# Patient Record
Sex: Female | Born: 1975 | Race: Black or African American | Hispanic: No | Marital: Married | Smoking: Current some day smoker
Health system: Southern US, Community
[De-identification: ages and names within clinical notes are randomized; demographics above are authoritative.]

## PROBLEM LIST (undated history)

## (undated) DIAGNOSIS — G5600 Carpal tunnel syndrome, unspecified upper limb: Secondary | ICD-10-CM

## (undated) DIAGNOSIS — N952 Postmenopausal atrophic vaginitis: Secondary | ICD-10-CM

## (undated) DIAGNOSIS — M25562 Pain in left knee: Secondary | ICD-10-CM

## (undated) DIAGNOSIS — K219 Gastro-esophageal reflux disease without esophagitis: Secondary | ICD-10-CM

## (undated) DIAGNOSIS — R748 Abnormal levels of other serum enzymes: Secondary | ICD-10-CM

## (undated) DIAGNOSIS — F329 Major depressive disorder, single episode, unspecified: Secondary | ICD-10-CM

## (undated) DIAGNOSIS — Z87448 Personal history of other diseases of urinary system: Secondary | ICD-10-CM

## (undated) DIAGNOSIS — E1169 Type 2 diabetes mellitus with other specified complication: Secondary | ICD-10-CM

## (undated) DIAGNOSIS — Z124 Encounter for screening for malignant neoplasm of cervix: Secondary | ICD-10-CM

## (undated) DIAGNOSIS — E785 Hyperlipidemia, unspecified: Secondary | ICD-10-CM

## (undated) DIAGNOSIS — J309 Allergic rhinitis, unspecified: Secondary | ICD-10-CM

## (undated) DIAGNOSIS — E28319 Asymptomatic premature menopause: Secondary | ICD-10-CM

## (undated) DIAGNOSIS — E669 Obesity, unspecified: Secondary | ICD-10-CM

## (undated) DIAGNOSIS — G4700 Insomnia, unspecified: Secondary | ICD-10-CM

## (undated) DIAGNOSIS — I10 Essential (primary) hypertension: Secondary | ICD-10-CM

## (undated) DIAGNOSIS — H04123 Dry eye syndrome of bilateral lacrimal glands: Secondary | ICD-10-CM

## (undated) DIAGNOSIS — F17200 Nicotine dependence, unspecified, uncomplicated: Secondary | ICD-10-CM

## (undated) DIAGNOSIS — R3915 Urgency of urination: Secondary | ICD-10-CM

## (undated) DIAGNOSIS — F419 Anxiety disorder, unspecified: Secondary | ICD-10-CM

## (undated) DIAGNOSIS — M545 Low back pain: Secondary | ICD-10-CM

## (undated) DIAGNOSIS — G43909 Migraine, unspecified, not intractable, without status migrainosus: Secondary | ICD-10-CM

## (undated) DIAGNOSIS — R030 Elevated blood-pressure reading, without diagnosis of hypertension: Secondary | ICD-10-CM

## (undated) DIAGNOSIS — R002 Palpitations: Secondary | ICD-10-CM

## (undated) DIAGNOSIS — G4730 Sleep apnea, unspecified: Secondary | ICD-10-CM

## (undated) DIAGNOSIS — R000 Tachycardia, unspecified: Secondary | ICD-10-CM

## (undated) DIAGNOSIS — T7840XA Allergy, unspecified, initial encounter: Secondary | ICD-10-CM

## (undated) DIAGNOSIS — Z8719 Personal history of other diseases of the digestive system: Secondary | ICD-10-CM

## (undated) DIAGNOSIS — I4711 Inappropriate sinus tachycardia, so stated: Secondary | ICD-10-CM

## (undated) HISTORY — DX: Insomnia, unspecified: G47.00

## (undated) HISTORY — DX: Low back pain: M54.5

## (undated) HISTORY — DX: Asymptomatic premature menopause: E28.319

## (undated) HISTORY — DX: Essential (primary) hypertension: I10

## (undated) HISTORY — DX: Allergy, unspecified, initial encounter: T78.40XA

## (undated) HISTORY — DX: Carpal tunnel syndrome, unspecified upper limb: G56.00

## (undated) HISTORY — DX: Type 2 diabetes mellitus with other specified complication: E11.69

## (undated) HISTORY — DX: Urgency of urination: R39.15

## (undated) HISTORY — DX: Hyperlipidemia, unspecified: E78.5

## (undated) HISTORY — DX: Personal history of other diseases of the digestive system: Z87.19

## (undated) HISTORY — PX: CHOLECYSTECTOMY: SHX55

## (undated) HISTORY — DX: Sleep apnea, unspecified: G47.30

## (undated) HISTORY — DX: Dry eye syndrome of bilateral lacrimal glands: H04.123

## (undated) HISTORY — DX: Gastro-esophageal reflux disease without esophagitis: K21.9

## (undated) HISTORY — DX: Nicotine dependence, unspecified, uncomplicated: F17.200

## (undated) HISTORY — DX: Encounter for screening for malignant neoplasm of cervix: Z12.4

## (undated) HISTORY — DX: Obesity, unspecified: E66.9

## (undated) HISTORY — DX: Major depressive disorder, single episode, unspecified: F32.9

## (undated) HISTORY — DX: Abnormal levels of other serum enzymes: R74.8

## (undated) HISTORY — DX: Morbid (severe) obesity due to excess calories: E66.01

## (undated) HISTORY — DX: Elevated blood-pressure reading, without diagnosis of hypertension: R03.0

## (undated) HISTORY — DX: Anxiety disorder, unspecified: F41.9

## (undated) HISTORY — DX: Personal history of other diseases of urinary system: Z87.448

## (undated) HISTORY — DX: Allergic rhinitis, unspecified: J30.9

## (undated) HISTORY — DX: Inappropriate sinus tachycardia, so stated: I47.11

## (undated) HISTORY — DX: Tachycardia, unspecified: R00.0

## (undated) HISTORY — DX: Palpitations: R00.2

## (undated) HISTORY — DX: Postmenopausal atrophic vaginitis: N95.2

## (undated) HISTORY — DX: Migraine, unspecified, not intractable, without status migrainosus: G43.909

## (undated) HISTORY — DX: Pain in left knee: M25.562

---

## 1998-06-27 ENCOUNTER — Ambulatory Visit (HOSPITAL_COMMUNITY): Admission: RE | Admit: 1998-06-27 | Discharge: 1998-06-27 | Payer: Self-pay | Admitting: Gastroenterology

## 2003-10-18 ENCOUNTER — Emergency Department (HOSPITAL_COMMUNITY): Admission: EM | Admit: 2003-10-18 | Discharge: 2003-10-18 | Payer: Self-pay | Admitting: Emergency Medicine

## 2009-11-09 ENCOUNTER — Encounter: Payer: Self-pay | Admitting: Family Medicine

## 2010-10-16 ENCOUNTER — Ambulatory Visit (INDEPENDENT_AMBULATORY_CARE_PROVIDER_SITE_OTHER): Payer: 59 | Admitting: Family Medicine

## 2010-10-16 ENCOUNTER — Encounter: Payer: Self-pay | Admitting: Family Medicine

## 2010-10-16 ENCOUNTER — Ambulatory Visit: Payer: Self-pay | Admitting: Family Medicine

## 2010-10-16 DIAGNOSIS — R0609 Other forms of dyspnea: Secondary | ICD-10-CM

## 2010-10-16 DIAGNOSIS — G4700 Insomnia, unspecified: Secondary | ICD-10-CM

## 2010-10-16 DIAGNOSIS — R0989 Other specified symptoms and signs involving the circulatory and respiratory systems: Secondary | ICD-10-CM

## 2010-10-16 DIAGNOSIS — J309 Allergic rhinitis, unspecified: Secondary | ICD-10-CM

## 2010-10-16 DIAGNOSIS — T7840XA Allergy, unspecified, initial encounter: Secondary | ICD-10-CM | POA: Insufficient documentation

## 2010-10-16 DIAGNOSIS — Z87448 Personal history of other diseases of urinary system: Secondary | ICD-10-CM | POA: Insufficient documentation

## 2010-10-16 DIAGNOSIS — E663 Overweight: Secondary | ICD-10-CM | POA: Insufficient documentation

## 2010-10-16 DIAGNOSIS — K219 Gastro-esophageal reflux disease without esophagitis: Secondary | ICD-10-CM | POA: Insufficient documentation

## 2010-10-16 DIAGNOSIS — Z8719 Personal history of other diseases of the digestive system: Secondary | ICD-10-CM

## 2010-10-16 DIAGNOSIS — K449 Diaphragmatic hernia without obstruction or gangrene: Secondary | ICD-10-CM

## 2010-10-16 HISTORY — DX: Insomnia, unspecified: G47.00

## 2010-10-16 HISTORY — DX: Personal history of other diseases of the digestive system: Z87.19

## 2010-10-16 HISTORY — DX: Allergic rhinitis, unspecified: J30.9

## 2010-10-16 HISTORY — DX: Personal history of other diseases of urinary system: Z87.448

## 2010-10-22 ENCOUNTER — Encounter: Payer: Self-pay | Admitting: Family Medicine

## 2010-10-29 NOTE — Assessment & Plan Note (Signed)
Summary: new   Vital Signs:  Patient profile:   35 year old female Menstrual status:  irregular LMP:     08/31/2010 Height:      63 inches (160.02 cm) Weight:      185.75 pounds (84.43 kg) BMI:     33.02 O2 Sat:      99 % on Room air Temp:     98.0 degrees F (36.67 degrees C) oral Pulse rate:   90 / minute BP sitting:   117 / 84  (right arm) Cuff size:   large  Vitals Entered By: Josph Macho RMA (October 16, 2010 1:53 PM)  O2 Flow:  Room air CC: Establish new patient/ back pain and sinus trouble/ CF LMP (date): 08/31/2010     Menstrual Status irregular Enter LMP: 08/31/2010 Last PAP Result historical   History of Present Illness: Patient is a 35yo AA female who is in today for new patient appointment. She is struggling with some nasal congestion and allergies. Her other big stressor is actually her very sick husband who has recently had a non ST elevation MI. She has not had any recent hospitalizations herself. No recent illness/fevers/chills/CP/palp/SOB/GI or GU c/o.  Preventive Screening-Counseling & Management  Alcohol-Tobacco     Smoking Status: never  Caffeine-Diet-Exercise     Does Patient Exercise: no      Drug Use:  no.    Current Medications (verified): 1)  Meloxicam .... Once Daily 2)  Ambien .... At Bedtime 3)  Ibuprofen 200 Mg Tabs (Ibuprofen) .... As Needed  Allergies (verified): No Known Drug Allergies  Past History:  Family History: Last updated: 10/16/2010 Father: mid 37s, A&W, previous smoker Mother: 37,  lupus, RA Siblings:  P1/2Sister: 37, A&W P1/2Sister: 37, thyroid disease P1/2Brother: 37, A&W M1/2Brother: 37yo, allergies M1/2Sister: 37yo, asthma MGM: 37, scleroderma, arthritis, glaucoma, cataracts MGF: 37, prostate cancer, CAD s/p angioplasty, DMII PGM: 37s, breast cancer dx in 21s, thyroid disease PGF: deceased unknown age, DM Children: None  Social History: Last updated: 10/16/2010 Regular exercise-no Married cigars,  one a day Alcohol use-yes, occasional 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 No dietary restrictions Wears seat belt  Risk Factors: Exercise: no (10/16/2010)  Risk Factors: Smoking Status: never (10/16/2010)  Past Surgical History: Denies surgical history  Family History: Father: mid 37s, A&W, previous smoker Mother: 37,  lupus, RA Siblings:  P1/2Sister: 37, A&W P1/2Sister: 37, thyroid disease P1/2Brother: 37, A&W M1/2Brother: 37yo, allergies M1/2Sister: 37yo, asthma MGM: 37, scleroderma, arthritis, glaucoma, cataracts MGF: 37, prostate cancer, CAD s/p angioplasty, DMII PGM: 37s, breast cancer dx in 79s, thyroid disease PGF: deceased unknown age, DM Children: None  Social History: Regular exercise-no Married cigars, one a day Alcohol use-yes, occasional 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    Physical Exam  General:  Well-developed,well-nourished,in no acute distress; alert,appropriate and cooperative throughout examination Head:  Normocephalic and atraumatic without obvious abnormalities. No apparent alopecia or balding. Eyes:  No corneal or conjunctival inflammation noted. EOMI. Perrla.  Ears:  External ear exam shows no significant lesions or deformities.  Otoscopic examination  reveals clear canals, tympanic membranes are intact bilaterally without bulging, retraction, inflammation or discharge.  Hearing is grossly normal bilaterally. Nose:  External nasal examination shows no deformity or inflammation. Nasal mucosa are pink and moist without lesions or exudates. Mouth:  Oral mucosa and oropharynx without lesions or exudates.  Teeth in good repair. Neck:  No deformities, masses, or tenderness noted. Lungs:  Normal respiratory effort, chest expands symmetrically. Lungs are clear to auscultation, no crackles or wheezes. Heart:  Normal rate and regular rhythm. S1 and S2 normal without gallop, murmur, click, rub or other extra sounds. Abdomen:  Bowel sounds positive,abdomen soft and non-tender without masses, organomegaly or hernias noted. Msk:  No deformity or scoliosis noted of thoracic or lumbar spine.   Pulses:  R and L carotid,radial,femoral,dorsalis pedis and posterior tibial pulses are full and equal bilaterally Extremities:  No clubbing, cyanosis, edema, or deformity noted with normal full range of motion of all joints.   Neurologic:  No cranial nerve deficits noted. Station and gait are normal. Plantar reflexes are down-going bilaterally. DTRs are symmetrical throughout. Sensory, motor and coordinative functions appear intact. Skin:  Intact without suspicious lesions or rashes Cervical Nodes:  No lymphadenopathy noted Psych:  Cognition and judgment appear intact. Alert and cooperative with normal attention span and concentration. No apparent delusions, illusions, hallucinations   Impression & Recommendations:  Problem # 1:  ALLERGIC RHINITIS (ICD-477.9)  Her updated medication list for this problem includes:    Zyrtec Allergy 10 Mg Tbdp (Cetirizine hcl) .Marland Kitchen... 1 tab by mouth daily as needed allergies    Ayr 0.65 % Soln (Saline) .Marland Kitchen... 1 spray each each nostril daily Call if symptoms worsen, can add Mucinex as needed   Problem # 2:  HIATAL HERNIA WITH REFLUX, HX OF (ICD-V12.79) Minimal symptoms should avoid spicy and fatty foods especially together. Eat small, frequent meals not  too close to bedtime and may try Tums, Mylanta and/or Ranitidine prior to next visit as needed   Problem # 3:  SNORING (ICD-786.09) Agrees that her snoring and fatigue are worsening and agrees to go for a sleep study and evaluation  Problem # 4:  OVERWEIGHT (ICD-278.02) Avoid trans fats, increase exercise, eat small, frequent meals with lean proteins and complex carbs  Complete Medication List: 1)  Meloxicam  .... Once daily 2)  Ambien  .... At bedtime 3)  Ibuprofen 200 Mg Tabs (Ibuprofen) .... As needed 4)  Norco 5-325 Mg Tabs (Hydrocodone-acetaminophen) .... 1/2 to 1 tab by mouth two times a day as needed severe pain 5)  Mucinex 600 Mg Xr12h-tab (Guaifenesin) .Marland Kitchen.. 11 tab by mouth two times a day x 10 days 6)  Zyrtec Allergy 10 Mg Tbdp (Cetirizine hcl) .Marland Kitchen.. 1 tab by mouth daily as needed allergies 7)  Ayr 0.65 % Soln (Saline) .Marland Kitchen.. 1 spray each each nostril daily  Patient Instructions: 1)  Please schedule a follow-up appointment in 1 to 2 month.  2)  release of records [facility] 3)  moist heat, gentle stretching, call if back worsens 4)  Take 1000 mg of tylenol every 6 hours as needed for relief of pain or comfort of fever. Avoid taking more than 3000 mg in a 24 hour period( can cause liver damage in higher doses).  Prescriptions: NORCO 5-325 MG TABS (HYDROCODONE-ACETAMINOPHEN) 1/2 to 1 tab by mouth two times a day as needed severe pain  #30 x 0   Entered and Authorized by:   Danise Edge MD   Signed by:  Danise Edge MD on 10/16/2010   Method used:   Print then Give to Patient   RxID:   [id number]    Orders Added: 1)  New Patient Level IV [99204]    Preventive Care Screening  Pap Smear:    Date:  11/09/2009    Results:  historical

## 2010-12-19 ENCOUNTER — Ambulatory Visit (INDEPENDENT_AMBULATORY_CARE_PROVIDER_SITE_OTHER): Payer: 59 | Admitting: Family Medicine

## 2010-12-19 ENCOUNTER — Encounter: Payer: Self-pay | Admitting: Family Medicine

## 2010-12-19 VITALS — BP 123/83 | HR 88 | Temp 98.2°F | Ht 63.0 in | Wt 184.1 lb

## 2010-12-19 DIAGNOSIS — E663 Overweight: Secondary | ICD-10-CM

## 2010-12-19 DIAGNOSIS — M545 Low back pain, unspecified: Secondary | ICD-10-CM

## 2010-12-19 DIAGNOSIS — M549 Dorsalgia, unspecified: Secondary | ICD-10-CM

## 2010-12-19 HISTORY — DX: Low back pain, unspecified: M54.50

## 2010-12-19 MED ORDER — MELOXICAM 15 MG PO TABS: 15.0000 mg | ORAL_TABLET | Freq: Every day | ORAL | Status: AC

## 2010-12-19 MED ORDER — HYDROCODONE-ACETAMINOPHEN 5-325 MG PO TABS: 1.0000 | ORAL_TABLET | Freq: Two times a day (BID) | ORAL | Status: DC | PRN

## 2010-12-19 MED ORDER — LIDOCAINE 5 % EX PTCH: 1.0000 | MEDICATED_PATCH | CUTANEOUS | Status: AC

## 2010-12-19 MED ORDER — CYCLOBENZAPRINE HCL 10 MG PO TABS: 10.0000 mg | ORAL_TABLET | Freq: Two times a day (BID) | ORAL | Status: DC | PRN

## 2010-12-19 MED ORDER — MELOXICAM 15 MG PO TABS: 15.0000 mg | ORAL_TABLET | Freq: Every day | ORAL | Status: DC

## 2010-12-19 MED ORDER — METHYLPREDNISOLONE 4 MG PO KIT: PACK | ORAL | Status: AC

## 2010-12-19 NOTE — Assessment & Plan Note (Signed)
Patient encouraged to try slow steady weight loss to help with her back. Small frequent meals and avoid trans fats.

## 2010-12-19 NOTE — Patient Instructions (Signed)
Back Pain (Lumbosacral Strain) Back pain is one of the most common causes of pain. There are many causes of back pain. Most are not serious conditions.  CAUSES Your backbone (spinal column) is made up of 24 main vertebral bodies, the sacrum, and the coccyx. These are held together by muscles and tough, fibrous tissue (ligaments). Nerve roots pass through the openings between the vertebrae. A sudden move or injury to the back may cause injury to, or pressure on, these nerves. This may result in localized back pain or pain movement (radiation) into the buttocks, down the leg, and into the foot. Sharp, shooting pain from the buttock down the back of the leg (sciatica) is frequently associated with a ruptured (herniated) disc. Pain may be caused by muscle spasm alone. Your caregiver can often find the cause of your pain by the details of your symptoms and an exam. In some cases, you may need tests (such as X-rays). Your caregiver will work with you to decide if any tests are needed based on your specific exam. HOME CARE INSTRUCTIONS  Avoid an underactive lifestyle. Active exercise, as directed by your caregiver, is your greatest weapon against back pain.   Avoid hard physical activities (tennis, racquetball, water-skiing) if you are not in proper physical condition for it. This may aggravate and/or create problems.   If you have a back problem, avoid sports requiring sudden body movements. Swimming and walking are generally safer activities.   Maintain good posture.   Avoid becoming overweight (obese).   Use bed rest for only the most extreme, sudden (acute) episode. Your caregiver will help you determine how much bed rest is necessary.   For acute conditions, you may put ice on the injured area.   Put ice in a plastic bag.   Place a towel between your skin and the bag.   Leave the ice on for 15 minutes at a time, every 2 hours, or as needed.   After you are improved and more active, it may  help to apply heat for 30 minutes before activities.  See your caregiver if you are having pain that lasts longer than expected. Your caregiver can advise appropriate exercises and/or therapy if needed. With conditioning, most back problems can be avoided. SEEK IMMEDIATE MEDICAL CARE IF:  You have numbness, tingling, weakness, or problems with the use of your arms or legs.   You experience severe back pain not relieved with medicines.   There is a change in bowel or bladder control.   You have increasing pain in any area of the body, including your belly (abdomen).   You notice shortness of breath, dizziness, or feel faint.   You feel sick to your stomach (nauseous), are throwing up (vomiting), or become sweaty.   You notice discoloration of your toes or legs, or your feet get very cold.   Your back pain is getting worse.   You have an oral temperature above 104, not controlled by medicine.  MAKE SURE YOU:   Understand these instructions.   Will watch your condition.   Will get help right away if you are not doing well or get worse.  Document Released: 05/07/2005 Document Re-Released: 10/22/2009 [employer] Patient Information 2011 [employer]. 

## 2010-12-19 NOTE — Progress Notes (Signed)
Nicole Ball [id number] 10-31-1975 12/19/2010      Progress Note-Follow Up  Subjective  Chief Complaint  Chief Complaint  Patient presents with  . Back Pain    follow up    HPI  Patient is a  35yo African American female who has had trouble intermittently with low back pain and spasm but not for quite some time. 2 days ago she was in the shower dropped her razor and had an immediate spasm in her back. There was no radicular symptoms and the pain was localized to the mid lower back with just very slight pain into the right gluteus maximus. That gluteus maximus pain has resolved. But the low back pain is present. Hurts with prolonged sitting hurts with change of position and she has trouble getting comfortable at night. She's been using the oxygen in the morning and that has been somewhat hyper. She uses hydrochlorothiazide still having trouble sleeping but improved. She is here today because the symptoms are not resolving. No incontinence no fevers or chills. No GI or GU complaints. No recent illness, fevers, chills, chest pain, palpitations, shortness of breath.  Past Medical History  Diagnosis Date  . Allergy     seasonal  . Acute low back pain 12/19/2010  . Overweight 10/16/2010  . SNORING 10/16/2010  . UTI'S, HX OF 10/16/2010  . INSOMNIA 10/16/2010  . HIATAL HERNIA WITH REFLUX, HX OF 10/16/2010  . ALLERGIC RHINITIS 10/16/2010    History reviewed. No pertinent past surgical history.  Family History  Problem Relation Age of Onset  . Lupus Mother   . Arthritis Maternal Grandmother   . Scleroderma Maternal Grandmother   . Glaucoma Maternal Grandmother   . Cataracts Maternal Grandmother   . Cancer Maternal Grandfather     prostate  . Coronary artery disease Maternal Grandfather     s/p angioplasty  . Diabetes Maternal Grandfather     Type 2  . Cancer Paternal Grandmother     Breast ca- dx in 30's  . Thyroid disease Paternal Grandmother   . Diabetes Paternal Grandfather   .  Thyroid disease Sister   . Asthma Sister   . Allergies Brother   . Arthritis Other     History   Social History  . Marital Status: Single    Spouse Name: N/A    Number of Children: N/A  . Years of Education: N/A   Occupational History  . Not on file.   Social History Main Topics  . Smoking status: Current Some Day Smoker -- 0.2 packs/day    Types: Cigars  . Smokeless tobacco: Never Used  . Alcohol Use: Yes     occasional  . Drug Use: No  . Sexually Active: Yes -- Female partner(s)   Other Topics Concern  . Not on file   Social History Narrative  . No narrative on file    Current Outpatient Prescriptions on File Prior to Visit  Medication Sig Dispense Refill  . ibuprofen (ADVIL,MOTRIN) 200 MG tablet Take 200 mg by mouth as needed.        . Zolpidem Tartrate (AMBIEN PO) Take by mouth at bedtime.        Marland Kitchen DISCONTD: HYDROcodone-acetaminophen (NORCO) 5-325 MG per tablet Take 1 tablet by mouth 2 (two) times daily as needed. 1/2 to 1 tab po bid prn for severe pain       . DISCONTD: MELOXICAM PO Take by mouth daily.        . cetirizine (ZYRTEC) 10  MG tablet Take 10 mg by mouth daily as needed. For allergies       . guaiFENesin (MUCINEX) 600 MG 12 hr tablet Take 1,200 mg by mouth 2 (two) times daily. X 10 days       . sodium chloride (OCEAN) 0.65 % nasal spray 1 spray by Nasal route daily.          No Known Allergies  Review of Systems  Review of Systems  Constitutional: Negative for fever and malaise/fatigue.  HENT: Negative for congestion and neck pain.   Eyes: Negative for discharge.  Respiratory: Negative for shortness of breath.   Cardiovascular: Negative for chest pain, palpitations and leg swelling.  Gastrointestinal: Negative for nausea, abdominal pain and diarrhea.  Genitourinary: Negative for dysuria.  Musculoskeletal: Positive for back pain. Negative for myalgias and falls.  Skin: Negative for rash.  Neurological: Negative for loss of consciousness and  headaches.  Endo/Heme/Allergies: Negative for polydipsia.  Psychiatric/Behavioral: Negative for depression and suicidal ideas. The patient is not nervous/anxious and does not have insomnia.     Objective  BP 123/83  Pulse 88  Temp(Src) 98.2 F (36.8 C) (Oral)  Ht 5\' 3"  (1.6 m)  Wt 184 lb 1.9 oz (83.516 kg)  BMI 32.62 kg/m2  SpO2 100%  LMP 11/26/2010  Physical Exam  Physical Exam  Constitutional: She is oriented to person, place, and time and well-developed, well-nourished, and in no distress. No distress.  HENT:  Head: Normocephalic and atraumatic.  Eyes: Conjunctivae are normal.  Neck: Neck supple. No thyromegaly present.  Cardiovascular: Normal rate, regular rhythm and normal heart sounds.   No murmur heard. Pulmonary/Chest: Effort normal and breath sounds normal. She has no wheezes.  Abdominal: She exhibits no distension and no mass. There is no tenderness. There is no rebound.  Musculoskeletal: Normal range of motion. She exhibits tenderness. She exhibits no edema.       Mild pain with palp in b/l paravertebral muscles in lumbar spine  Lymphadenopathy:    She has no cervical adenopathy.  Neurological: She is alert and oriented to person, place, and time.  Skin: Skin is warm and dry. No rash noted. She is not diaphoretic.  Psychiatric: Memory, affect and judgment normal.       Assessment & Plan  Acute low back pain Patient is in today with a 2 day history of persistent low back pain. She was in the shower Dr. Evalyn Casco in to pick up and had immediate spasm and was unable to straighten. It is somewhat improved since the first day but still very uncomfortable. She has trouble sitting at her desk rising to stand getting comfortable in bed. She did have some molluscum at home she took that that was marginally helpful. She does use the hydrocodone at night helps her to sleep somewhat but the pain is still present in the morning. No particular symptoms. We will refill her  morphine 15 mg she'll take in the morning with food she may take plain Tylenol when necessary during the day as well at night she may use hydrocortisone and that is refilled today. She is also given some cyclobenzaprine to use Robaxin twice a day and encouraged to use it mostly at bedtime. She is to stay active but not to excess.  OVERWEIGHT Patient encouraged to try slow steady weight loss to help with her back. Small frequent meals and avoid trans fats.

## 2010-12-19 NOTE — Assessment & Plan Note (Signed)
Patient is in today with a 2 day history of persistent low back pain. She was in the shower Dr. Evalyn Casco in to pick up and had immediate spasm and was unable to straighten. It is somewhat improved since the first day but still very uncomfortable. She has trouble sitting at her desk rising to stand getting comfortable in bed. She did have some molluscum at home she took that that was marginally helpful. She does use the hydrocodone at night helps her to sleep somewhat but the pain is still present in the morning. No particular symptoms. We will refill her morphine 15 mg she'll take in the morning with food she may take plain Tylenol when necessary during the day as well at night she may use hydrocortisone and that is refilled today. She is also given some cyclobenzaprine to use Robaxin twice a day and encouraged to use it mostly at bedtime. She is to stay active but not to excess.

## 2011-01-02 ENCOUNTER — Encounter: Payer: Self-pay | Admitting: Family Medicine

## 2011-02-11 ENCOUNTER — Telehealth: Payer: Self-pay | Admitting: *Deleted

## 2011-02-11 MED ORDER — ZOLPIDEM TARTRATE 10 MG PO TABS: 10.0000 mg | ORAL_TABLET | Freq: Every evening | ORAL | Status: DC | PRN

## 2011-02-11 NOTE — Telephone Encounter (Signed)
Message left on cell voice mail that RX faxed to pharmacy.

## 2011-02-11 NOTE — Telephone Encounter (Signed)
Rx printed--PM

## 2011-02-11 NOTE — Telephone Encounter (Signed)
VM left at 4:51 on 7/2.  Pt is requesting refill on Ambien.  Pt has not been sleeping well and has been out of meds for 2+ weeks.  I have attempted to contact this patient by phone with the following results: left message to return my call on answering machine (home/mobile).  (Current dose of ambien is not in chart.)

## 2011-02-11 NOTE — Telephone Encounter (Signed)
RC from pt.  She last had this medication refilled by Dr. Pecola Leisure at [facility]. Pt does not know her dose.  Per [facility] at [facility], last dose was 10 mg filled on 12/09/10.  Pt would like refill to go to [facility].

## 2011-04-18 ENCOUNTER — Encounter: Payer: Self-pay | Admitting: Family Medicine

## 2011-04-18 ENCOUNTER — Ambulatory Visit (INDEPENDENT_AMBULATORY_CARE_PROVIDER_SITE_OTHER): Payer: 59 | Admitting: Family Medicine

## 2011-04-18 DIAGNOSIS — R030 Elevated blood-pressure reading, without diagnosis of hypertension: Secondary | ICD-10-CM

## 2011-04-18 DIAGNOSIS — M545 Low back pain, unspecified: Secondary | ICD-10-CM

## 2011-04-18 DIAGNOSIS — G4700 Insomnia, unspecified: Secondary | ICD-10-CM

## 2011-04-18 DIAGNOSIS — Z0000 Encounter for general adult medical examination without abnormal findings: Secondary | ICD-10-CM

## 2011-04-18 DIAGNOSIS — R350 Frequency of micturition: Secondary | ICD-10-CM

## 2011-04-18 DIAGNOSIS — E785 Hyperlipidemia, unspecified: Secondary | ICD-10-CM

## 2011-04-18 DIAGNOSIS — R5383 Other fatigue: Secondary | ICD-10-CM

## 2011-04-18 DIAGNOSIS — IMO0001 Reserved for inherently not codable concepts without codable children: Secondary | ICD-10-CM

## 2011-04-18 DIAGNOSIS — J309 Allergic rhinitis, unspecified: Secondary | ICD-10-CM

## 2011-04-18 DIAGNOSIS — R0681 Apnea, not elsewhere classified: Secondary | ICD-10-CM

## 2011-04-18 DIAGNOSIS — Z8719 Personal history of other diseases of the digestive system: Secondary | ICD-10-CM

## 2011-04-18 DIAGNOSIS — R5381 Other malaise: Secondary | ICD-10-CM

## 2011-04-18 DIAGNOSIS — E663 Overweight: Secondary | ICD-10-CM

## 2011-04-18 DIAGNOSIS — Z23 Encounter for immunization: Secondary | ICD-10-CM

## 2011-04-18 LAB — RENAL FUNCTION PANEL
Albumin: 4 g/dL (ref 3.5–5.2)
BUN: 17 mg/dL (ref 6–23)
Calcium: 9.5 mg/dL (ref 8.4–10.5)
Chloride: 108 mEq/L (ref 96–112)
Phosphorus: 3.7 mg/dL (ref 2.3–4.6)
Potassium: 4 mEq/L (ref 3.5–5.1)

## 2011-04-18 LAB — CBC WITH DIFFERENTIAL/PLATELET
Basophils Relative: 0.3 % (ref 0.0–3.0)
Eosinophils Absolute: 0.1 10*3/uL (ref 0.0–0.7)
Eosinophils Relative: 1.4 % (ref 0.0–5.0)
Hemoglobin: 12.8 g/dL (ref 12.0–15.0)
MCHC: 32.7 g/dL (ref 30.0–36.0)
MCV: 87.3 fl (ref 78.0–100.0)
Monocytes Absolute: 0.3 10*3/uL (ref 0.1–1.0)
Neutro Abs: 2.9 10*3/uL (ref 1.4–7.7)
RBC: 4.5 Mil/uL (ref 3.87–5.11)
WBC: 5 10*3/uL (ref 4.5–10.5)

## 2011-04-18 LAB — POCT URINALYSIS DIPSTICK
Bilirubin, UA: NEGATIVE
Glucose, UA: NEGATIVE
Leukocytes, UA: NEGATIVE
Nitrite, UA: NEGATIVE
Urobilinogen, UA: 0.2

## 2011-04-18 LAB — HEPATIC FUNCTION PANEL
ALT: 21 U/L (ref 0–35)
Albumin: 4 g/dL (ref 3.5–5.2)
Alkaline Phosphatase: 133 U/L — ABNORMAL HIGH (ref 39–117)
Bilirubin, Direct: 0 mg/dL (ref 0.0–0.3)
Total Protein: 7.5 g/dL (ref 6.0–8.3)

## 2011-04-18 LAB — LIPID PANEL
LDL Cholesterol: 108 mg/dL — ABNORMAL HIGH (ref 0–99)
Total CHOL/HDL Ratio: 3
Triglycerides: 128 mg/dL (ref 0.0–149.0)

## 2011-04-18 MED ORDER — ALPRAZOLAM 0.25 MG PO TABS: 0.2500 mg | ORAL_TABLET | Freq: Every evening | ORAL | Status: DC | PRN

## 2011-04-18 NOTE — Patient Instructions (Signed)
(High Blood Pressure) As your heart beats, it forces blood through your arteries. This force is your blood pressure. If the pressure is too high, it is called hypertension (HTN) or high blood pressure. HTN is dangerous because you may have it and not know it. High blood pressure may mean that your heart has to work harder to pump blood. Your arteries may be narrow or stiff. The extra work puts you at risk for heart disease, stroke, and other problems.  Blood pressure consists of two numbers, a higher number over a lower, 110/72, for example. It is stated as "110 over 72." The ideal is below 120 for the top number (systolic) and under 80 for the bottom (diastolic). Write down your blood pressure today. You should pay close attention to your blood pressure if you have certain conditions such as:  Heart failure.  Prior heart attack.   Diabetes   Chronic kidney disease.   Prior stroke.   Multiple risk factors for heart disease.   To see if you have HTN, your blood pressure should be measured while you are seated with your arm held at the level of the heart. It should be measured at least twice. A one-time elevated blood pressure reading (especially in the Emergency Department) does not mean that you need treatment. There may be conditions in which the blood pressure is different between your right and left arms. It is important to see your caregiver soon for a recheck. Most people have essential hypertension which means that there is not a specific cause. This type of high blood pressure may be lowered by changing lifestyle factors such as:  Stress.  Smoking.   Lack of exercise.   Excessive weight.  Drug/tobacco/alcohol use.   Eating less salt.   Most people do not have symptoms from high blood pressure until it has caused damage to the body. Effective treatment can often prevent, delay or reduce that damage. TREATMENT Treatment for high blood pressure, when a cause has been identified, is  directed at the cause. There are a large number of medications to treat HTN. These fall into several categories, and your caregiver will help you select the medicines that are best for you. Medications may have side effects. You should review side effects with your caregiver. If your blood pressure stays high after you have made lifestyle changes or started on medicines,   Your medication(s) may need to be changed.   Other problems may need to be addressed.   Be certain you understand your prescriptions, and know how and when to take your medicine.   Be sure to follow up with your caregiver within the time frame advised (usually within two weeks) to have your blood pressure rechecked and to review your medications.   If you are taking more than one medicine to lower your blood pressure, make sure you know how and at what times they should be taken. Taking two medicines at the same time can result in blood pressure that is too low.  SEEK IMMEDIATE MEDICAL CARE IF YOU DEVELOP:  A severe headache, blurred or changing vision, or confusion.   Unusual weakness or numbness, or a faint feeling.   Severe chest or abdominal pain, vomiting, or breathing problems.  MAKE SURE YOU:   Understand these instructions.   Will watch your condition.   Will get help right away if you are not doing well or get worse.  Document Released: 07/28/2005 Document Re-Released: 01/15/2010 [employer] Patient Information 2011 [employer]. 

## 2011-04-21 ENCOUNTER — Encounter: Payer: Self-pay | Admitting: Family Medicine

## 2011-04-21 DIAGNOSIS — IMO0001 Reserved for inherently not codable concepts without codable children: Secondary | ICD-10-CM | POA: Insufficient documentation

## 2011-04-21 DIAGNOSIS — E785 Hyperlipidemia, unspecified: Secondary | ICD-10-CM

## 2011-04-21 HISTORY — DX: Hyperlipidemia, unspecified: E78.5

## 2011-04-21 NOTE — Assessment & Plan Note (Signed)
May continue to use Cetirizine but is asked to stop all products with Sudafed due to bp

## 2011-04-21 NOTE — Assessment & Plan Note (Signed)
Patient has trouble falling asleep and staying asleep. Is using Melatonin and Unisom prn and does get some assistance from that. She struggles with chronic fatigue. Wakes frequently and has trouble staying asleep. She is encouraged to try and sleep 8 hours nightly by getting to bed a little and she is coached on sleep hygiene, needs dark, quiet, cool room etc. Sleep study is set up

## 2011-04-21 NOTE — Progress Notes (Signed)
Nicole Ball [id number] [birth year] 04/21/2011      Progress Note-Follow Up  Subjective  Chief Complaint  Chief Complaint  Patient presents with  . Insomnia    having trouble sleeping    HPI  Patient is a 35 yo AA female in today for follow up on multiple concerns. She continues to struggle with insomnia. She is comfortable falling asleep and staying asleep. She has been taking melatonin and to Unisom with mixed results. She was told to falling asleep and maintaining sleep. When she wakes up at night has trouble falling back to sleep. She wakes up fatigued. Occasional headaches. She's been struggling with her allergies and congestion has been taking some Sudafed denies palpitations, chest pain, shortness of breath, GI or GU complaints at this time. No recent febrile illness. Low back pain is improved somewhat on response to current medications.  Past Medical History  Diagnosis Date  . Allergy     seasonal  . Acute low back pain 12/19/2010  . Overweight 10/16/2010  . SNORING 10/16/2010  . UTI'S, HX OF 10/16/2010  . INSOMNIA 10/16/2010  . HIATAL HERNIA WITH REFLUX, HX OF 10/16/2010  . ALLERGIC RHINITIS 10/16/2010  . Hyperlipidemia 04/21/2011  . Elevated BP 04/21/2011    History reviewed. No pertinent past surgical history.  Family History  Problem Relation Age of Onset  . Lupus Mother   . Arthritis Maternal Grandmother   . Scleroderma Maternal Grandmother   . Glaucoma Maternal Grandmother   . Cataracts Maternal Grandmother   . Cancer Maternal Grandfather     prostate  . Coronary artery disease Maternal Grandfather     s/p angioplasty  . Diabetes Maternal Grandfather     Type 2  . Cancer Paternal Grandmother     Breast ca- dx in 29's  . Thyroid disease Paternal Grandmother   . Diabetes Paternal Grandfather   . Thyroid disease Sister   . Asthma Sister   . Allergies Brother   . Arthritis Other     History   Social History  . Marital Status: Single    Spouse Name: N/A   Number of Children: N/A  . Years of Education: N/A   Occupational History  . Not on file.   Social History Main Topics  . Smoking status: Current Some Day Smoker -- 0.1 packs/day    Types: Cigars  . Smokeless tobacco: Never Used   Comment: 1 black and milds  . Alcohol Use: Yes     occasional  . Drug Use: No  . Sexually Active: Yes -- Female partner(s)   Other Topics Concern  . Not on file   Social History Narrative  . No narrative on file    Current Outpatient Prescriptions on File Prior to Visit  Medication Sig Dispense Refill  . cetirizine (ZYRTEC) 10 MG tablet Take 10 mg by mouth daily as needed. For allergies       . cyclobenzaprine (FLEXERIL) 10 MG tablet Take 1 tablet (10 mg total) by mouth 2 (two) times daily as needed for muscle spasms.  60 tablet  1  . HYDROcodone-acetaminophen (NORCO) 5-325 MG per tablet Take 1 tablet by mouth 2 (two) times daily as needed. 1/2 to 1 tab po bid prn for severe pain  60 tablet  1  . ibuprofen (ADVIL,MOTRIN) 200 MG tablet Take 200 mg by mouth as needed.        . meloxicam (MOBIC) 15 MG tablet Take 1 tablet (15 mg total) by mouth daily.  30  tablet  2  . guaiFENesin (MUCINEX) 600 MG 12 hr tablet Take 1,200 mg by mouth 2 (two) times daily. X 10 days       . sodium chloride (OCEAN) 0.65 % nasal spray 1 spray by Nasal route daily.          No Known Allergies  Review of Systems  Review of Systems  Constitutional: Positive for malaise/fatigue. Negative for fever.  HENT: Negative for congestion.   Eyes: Negative for discharge.  Respiratory: Negative for shortness of breath.   Cardiovascular: Negative for chest pain, palpitations and leg swelling.  Gastrointestinal: Negative for nausea, abdominal pain and diarrhea.  Genitourinary: Negative for dysuria.  Musculoskeletal: Positive for back pain. Negative for falls.       Low back pain manageable  Skin: Negative for rash.  Neurological: Negative for loss of consciousness and headaches.    Endo/Heme/Allergies: Negative for polydipsia.  Psychiatric/Behavioral: Negative for depression and suicidal ideas. The patient has insomnia. The patient is not nervous/anxious.     Objective  BP 126/90  Pulse 93  Temp(Src) 98 F (36.7 C) (Oral)  Ht 5\' 3"  (1.6 m)  Wt 190 lb 12.8 oz (86.546 kg)  BMI 33.80 kg/m2  SpO2 98%  LMP 12/16/2010  Physical Exam  Physical Exam  Constitutional: She is oriented to person, place, and time and well-developed, well-nourished, and in no distress. No distress.  HENT:  Head: Normocephalic and atraumatic.  Eyes: Conjunctivae are normal.  Neck: Neck supple. No thyromegaly present.  Cardiovascular: Normal rate, regular rhythm and normal heart sounds.   No murmur heard. Pulmonary/Chest: Effort normal and breath sounds normal. She has no wheezes.  Abdominal: She exhibits no distension and no mass.  Musculoskeletal: She exhibits no edema.  Lymphadenopathy:    She has no cervical adenopathy.  Neurological: She is alert and oriented to person, place, and time.  Skin: Skin is warm and dry. No rash noted. She is not diaphoretic.  Psychiatric: Memory, affect and judgment normal.    Lab Results  Component Value Date   TSH 0.80 04/18/2011   Lab Results  Component Value Date   WBC 5.0 04/18/2011   HGB 12.8 04/18/2011   HCT 39.2 04/18/2011   MCV 87.3 04/18/2011   PLT 182.0 04/18/2011   Lab Results  Component Value Date   CREATININE 0.6 04/18/2011   BUN 17 04/18/2011   NA 139 04/18/2011   K 4.0 04/18/2011   CL 108 04/18/2011   CO2 21 04/18/2011   Lab Results  Component Value Date   ALT 21 04/18/2011   AST 20 04/18/2011   ALKPHOS 133* 04/18/2011   BILITOT 0.4 04/18/2011   Lab Results  Component Value Date   CHOL 196 04/18/2011   Lab Results  Component Value Date   HDL 62.80 04/18/2011   Lab Results  Component Value Date   LDLCALC 108* 04/18/2011   Lab Results  Component Value Date   TRIG 128.0 04/18/2011   Lab Results  Component Value Date   CHOLHDL 3 04/18/2011      Assessment & Plan  INSOMNIA Patient has trouble falling asleep and staying asleep. Is using Melatonin and Unisom prn and does get some assistance from that. She struggles with chronic fatigue. Wakes frequently and has trouble staying asleep. She is encouraged to try and sleep 8 hours nightly by getting to bed a little and she is coached on sleep hygiene, needs dark, quiet, cool room etc. Sleep study is set up   HIATAL  HERNIA WITH REFLUX, HX OF Symptoms manageable at present. Avoid offeniding foods and may use Ranitidine prn  Acute low back pain Manageable with current meds and stretching, no c/o today  ALLERGIC RHINITIS May continue to use Cetirizine but is asked to stop all products with Sudafed due to bp  Elevated BP Mild elevation of diastolic bp, encouraged to avoid Sudafed, minimize sodium and we will arrange for a sleep study, TSH wnl  Hyperlipidemia Avoid trans fats, increase exercise, start a fish oil supplement

## 2011-04-21 NOTE — Assessment & Plan Note (Signed)
Mild elevation of diastolic bp, encouraged to avoid Sudafed, minimize sodium and we will arrange for a sleep study, TSH wnl

## 2011-04-21 NOTE — Assessment & Plan Note (Signed)
Manageable with current meds and stretching, no c/o today

## 2011-04-21 NOTE — Assessment & Plan Note (Signed)
Symptoms manageable at present. Avoid offeniding foods and may use Ranitidine prn

## 2011-04-21 NOTE — Assessment & Plan Note (Signed)
Avoid trans fats, increase exercise, start a fish oil supplement

## 2011-05-12 ENCOUNTER — Telehealth: Payer: Self-pay

## 2011-05-12 NOTE — Telephone Encounter (Signed)
OK to call in Zolpidem 10mg  1 tab po qhs but only a trial amount and then need to see her in a week or two to discuss response or other options, Disp #14, no rf. Warn her of side effects, excessive sedation, sleep walking, etc

## 2011-05-12 NOTE — Telephone Encounter (Signed)
Pt states Xanax is helping but ran out of medication. Patient states she is sleeping but its not helping her be in a deep sleep? Pt states she can get more xanax on Friday 05-16-11. Patient would like to know if Ambien could be sent to pharmacy? Please advise?

## 2011-05-13 MED ORDER — ZOLPIDEM TARTRATE 10 MG PO TABS: 10.0000 mg | ORAL_TABLET | Freq: Every evening | ORAL | Status: DC | PRN

## 2011-05-13 NOTE — Telephone Encounter (Signed)
RX sent to pharmacy and message left for patient to return my call

## 2011-05-13 NOTE — Telephone Encounter (Signed)
Patient informed and instructed to call back next week to schedule an appt.

## 2011-06-06 ENCOUNTER — Ambulatory Visit (INDEPENDENT_AMBULATORY_CARE_PROVIDER_SITE_OTHER): Payer: 59 | Admitting: Family Medicine

## 2011-06-06 ENCOUNTER — Encounter: Payer: Self-pay | Admitting: Family Medicine

## 2011-06-06 DIAGNOSIS — F419 Anxiety disorder, unspecified: Secondary | ICD-10-CM

## 2011-06-06 DIAGNOSIS — K219 Gastro-esophageal reflux disease without esophagitis: Secondary | ICD-10-CM

## 2011-06-06 DIAGNOSIS — Z8719 Personal history of other diseases of the digestive system: Secondary | ICD-10-CM

## 2011-06-06 DIAGNOSIS — G4730 Sleep apnea, unspecified: Secondary | ICD-10-CM

## 2011-06-06 DIAGNOSIS — IMO0001 Reserved for inherently not codable concepts without codable children: Secondary | ICD-10-CM

## 2011-06-06 DIAGNOSIS — M549 Dorsalgia, unspecified: Secondary | ICD-10-CM

## 2011-06-06 DIAGNOSIS — M545 Low back pain, unspecified: Secondary | ICD-10-CM

## 2011-06-06 DIAGNOSIS — F32A Depression, unspecified: Secondary | ICD-10-CM

## 2011-06-06 DIAGNOSIS — R030 Elevated blood-pressure reading, without diagnosis of hypertension: Secondary | ICD-10-CM

## 2011-06-06 DIAGNOSIS — F341 Dysthymic disorder: Secondary | ICD-10-CM

## 2011-06-06 MED ORDER — OMEPRAZOLE 20 MG PO CPDR: 20.0000 mg | DELAYED_RELEASE_CAPSULE | Freq: Every day | ORAL | Status: DC

## 2011-06-06 MED ORDER — CITALOPRAM HYDROBROMIDE 10 MG PO TABS: 10.0000 mg | ORAL_TABLET | Freq: Every day | ORAL | Status: DC

## 2011-06-06 MED ORDER — HYDROCODONE-ACETAMINOPHEN 5-325 MG PO TABS: 1.0000 | ORAL_TABLET | Freq: Two times a day (BID) | ORAL | Status: DC | PRN

## 2011-06-06 MED ORDER — ALPRAZOLAM 0.5 MG PO TABS: 0.5000 mg | ORAL_TABLET | Freq: Three times a day (TID) | ORAL | Status: DC | PRN

## 2011-06-06 NOTE — Assessment & Plan Note (Addendum)
Has flared up recently is taking Omeprazole 20 mg daily, avoid offending foods, given rx, consider H Pylori testing if synmptoms worsen

## 2011-06-06 NOTE — Patient Instructions (Addendum)
Anxiety and Panic Attacks Your caregiver has informed you that you are having an anxiety or panic attack. There may be many forms of this. Most of the time these attacks come suddenly and without warning. They come at any time of day, including periods of sleep, and at any time of life. They may be strong and unexplained. Although panic attacks are very scary, they are physically harmless. Sometimes the cause of your anxiety is not known. Anxiety is a protective mechanism of the body in its fight or flight mechanism. Most of these perceived danger situations are actually nonphysical situations (such as anxiety over losing a job). CAUSES  The causes of an anxiety or panic attack are many. Panic attacks may occur in otherwise healthy people given a certain set of circumstances. There may be a genetic cause for panic attacks. Some medications may also have anxiety as a side effect. SYMPTOMS  Some of the most common feelings are:  Intense terror.   Dizziness, feeling faint.   Hot and cold flashes.   Fear of going crazy.   Feelings that nothing is real.   Sweating.   Shaking.   Chest pain or a fast heartbeat (palpitations).   Smothering, choking sensations.   Feelings of impending doom and that death is near.   Tingling of extremities, this may be from over-breathing.   Altered reality (derealization).   Being detached from yourself (depersonalization).  Several symptoms can be present to make up anxiety or panic attacks. DIAGNOSIS  The evaluation by your caregiver will depend on the type of symptoms you are experiencing. The diagnosis of anxiety or panic attack is made when no physical illness can be determined to be a cause of the symptoms. TREATMENT  Treatment to prevent anxiety and panic attacks may include:  Avoidance of circumstances that cause anxiety.   Reassurance and relaxation.   Regular exercise.   Relaxation therapies, such as yoga.   Psychotherapy with a  psychiatrist or therapist.   Avoidance of caffeine, alcohol and illegal drugs.   Prescribed medication.  SEEK IMMEDIATE MEDICAL CARE IF:   You experience panic attack symptoms that are different than your usual symptoms.   You have any worsening or concerning symptoms.  Document Released: 07/28/2005 Document Revised: 04/09/2011 Document Reviewed: 11/29/2009 [facility] Patient Information 2012 [place], [place].  Start MegaRed (Krill oil) caps daily, start a Vitamin B complex and Evening Primrose Oil caps daily  Try alternating Meloxicam every other day with Tylenol 650 mg every other day as your baseline for pain management

## 2011-06-07 ENCOUNTER — Encounter: Payer: Self-pay | Admitting: Family Medicine

## 2011-06-07 DIAGNOSIS — F419 Anxiety disorder, unspecified: Secondary | ICD-10-CM

## 2011-06-07 DIAGNOSIS — G4733 Obstructive sleep apnea (adult) (pediatric): Secondary | ICD-10-CM | POA: Insufficient documentation

## 2011-06-07 DIAGNOSIS — G4730 Sleep apnea, unspecified: Secondary | ICD-10-CM

## 2011-06-07 DIAGNOSIS — F32A Depression, unspecified: Secondary | ICD-10-CM

## 2011-06-07 DIAGNOSIS — F411 Generalized anxiety disorder: Secondary | ICD-10-CM | POA: Insufficient documentation

## 2011-06-07 HISTORY — DX: Depression, unspecified: F32.A

## 2011-06-07 HISTORY — DX: Anxiety disorder, unspecified: F41.9

## 2011-06-07 HISTORY — DX: Sleep apnea, unspecified: G47.30

## 2011-06-07 NOTE — Assessment & Plan Note (Signed)
Is tolerating her CPAP machine and does think she feels a little less tired. She does have trouble sleeping still.She will be allowed up to use up to a mg of Alprazolam qhs to help and we will reevaluate at next visit.

## 2011-06-07 NOTE — Progress Notes (Signed)
Nicole Ball [id number] Oct 20, 1975 06/07/2011      Progress Note-Follow Up  Subjective  Chief Complaint  Chief Complaint  Patient presents with  . discuss sleep study results.    HPI  Patient is a 35 -year-old [place] American female who is in today to discuss her sleep apnea and fatigue. She is using her CPAP machine and while she does feel somewhat less fatigued she does still struggle with daily fatigue. She does also notes trouble sleeping well with the CPAP on. She also has a healing husband who keeps her up frequently. She is in no acute low mood and irritability. Denies suicidal ideation. Is willing to consider medications. Does occasionally have some low back pain but this is manageable with her meloxicam and her hydrocodone. No recent worsening of symptoms. Her heartburn has recently worsened. She reports years ago being told she had a hiatal hernia but was able to control her symptoms with dietary and lifestyle changes but now the symptoms have recurred. She has been using Omeprazole to manage her symptoms no other changes noted to her bowels.  Past Medical History  Diagnosis Date  . Allergy     seasonal  . Acute low back pain 12/19/2010  . Overweight 10/16/2010  . SNORING 10/16/2010  . UTI'S, HX OF 10/16/2010  . INSOMNIA 10/16/2010  . HIATAL HERNIA WITH REFLUX, HX OF 10/16/2010  . ALLERGIC RHINITIS 10/16/2010  . Hyperlipidemia 04/21/2011  . Elevated BP 04/21/2011  . Sleep apnea 06/07/2011    History reviewed. No pertinent past surgical history.  Family History  Problem Relation Age of Onset  . Lupus Mother   . Arthritis Maternal Grandmother   . Scleroderma Maternal Grandmother   . Glaucoma Maternal Grandmother   . Cataracts Maternal Grandmother   . Cancer Maternal Grandfather     prostate  . Coronary artery disease Maternal Grandfather     s/p angioplasty  . Diabetes Maternal Grandfather     Type 2  . Cancer Paternal Grandmother     Breast ca- dx in 77's  . Thyroid  disease Paternal Grandmother   . Diabetes Paternal Grandfather   . Thyroid disease Sister   . Asthma Sister   . Allergies Brother   . Arthritis Other     History   Social History  . Marital Status: Married    Spouse Name: N/A    Number of Children: N/A  . Years of Education: N/A   Occupational History  . Not on file.   Social History Main Topics  . Smoking status: Current Some Day Smoker -- 0.1 packs/day    Types: Cigars  . Smokeless tobacco: Never Used   Comment: 1 black and milds  . Alcohol Use: Yes     occasional  . Drug Use: No  . Sexually Active: Yes -- Female partner(s)   Other Topics Concern  . Not on file   Social History Narrative  . No narrative on file    Current Outpatient Prescriptions on File Prior to Visit  Medication Sig Dispense Refill  . cetirizine (ZYRTEC) 10 MG tablet Take 10 mg by mouth daily as needed. For allergies       . cyclobenzaprine (FLEXERIL) 10 MG tablet Take 1 tablet (10 mg total) by mouth 2 (two) times daily as needed for muscle spasms.  60 tablet  1  . guaiFENesin (MUCINEX) 600 MG 12 hr tablet Take 1,200 mg by mouth 2 (two) times daily. As needed X 10 days      .  ibuprofen (ADVIL,MOTRIN) 200 MG tablet Take 200 mg by mouth as needed.        . meloxicam (MOBIC) 15 MG tablet Take 1 tablet (15 mg total) by mouth daily.  30 tablet  2  . sodium chloride (OCEAN) 0.65 % nasal spray Place 1 spray into the nose daily as needed.         No Known Allergies  Review of Systems  Review of Systems  Constitutional: Positive for malaise/fatigue. Negative for fever.  HENT: Negative for congestion.   Eyes: Negative for discharge.  Respiratory: Negative for shortness of breath.   Cardiovascular: Negative for chest pain, palpitations and leg swelling.  Gastrointestinal: Negative for nausea, abdominal pain and diarrhea.  Genitourinary: Negative for dysuria.  Musculoskeletal: Positive for back pain. Negative for falls.  Skin: Negative for rash.    Neurological: Negative for loss of consciousness and headaches.  Endo/Heme/Allergies: Negative for polydipsia.  Psychiatric/Behavioral: Positive for depression. Negative for suicidal ideas, hallucinations and substance abuse. The patient is nervous/anxious and has insomnia.     Objective  BP 125/86  Pulse 93  Ht 5\' 3"  (1.6 m)  Wt 195 lb (88.451 kg)  BMI 34.54 kg/m2  SpO2 99%  Physical Exam  Physical Exam  Constitutional: She is oriented to person, place, and time and well-developed, well-nourished, and in no distress. No distress.  HENT:  Head: Normocephalic and atraumatic.  Eyes: Conjunctivae are normal.  Neck: Neck supple. No thyromegaly present.  Cardiovascular: Normal rate, regular rhythm and normal heart sounds.   No murmur heard. Pulmonary/Chest: Effort normal and breath sounds normal. She has no wheezes.  Abdominal: She exhibits no distension and no mass.  Musculoskeletal: She exhibits no edema.  Lymphadenopathy:    She has no cervical adenopathy.  Neurological: She is alert and oriented to person, place, and time.  Skin: Skin is warm and dry. No rash noted. She is not diaphoretic.  Psychiatric: Memory, affect and judgment normal.    Lab Results  Component Value Date   TSH 0.80 04/18/2011   Lab Results  Component Value Date   WBC 5.0 04/18/2011   HGB 12.8 04/18/2011   HCT 39.2 04/18/2011   MCV 87.3 04/18/2011   PLT 182.0 04/18/2011   Lab Results  Component Value Date   CREATININE 0.6 04/18/2011   BUN 17 04/18/2011   NA 139 04/18/2011   K 4.0 04/18/2011   CL 108 04/18/2011   CO2 21 04/18/2011   Lab Results  Component Value Date   ALT 21 04/18/2011   AST 20 04/18/2011   ALKPHOS 133* 04/18/2011   BILITOT 0.4 04/18/2011   Lab Results  Component Value Date   CHOL 196 04/18/2011   Lab Results  Component Value Date   HDL 62.80 04/18/2011   Lab Results  Component Value Date   LDLCALC 108* 04/18/2011   Lab Results  Component Value Date   TRIG 128.0 04/18/2011   Lab Results   Component Value Date   CHOLHDL 3 04/18/2011     Assessment & Plan  HIATAL HERNIA WITH REFLUX, HX OF Has flared up recently is taking Omeprazole 20 mg daily, avoid offending foods, given rx, consider H Pylori testing if synmptoms worsen  Sleep apnea Is tolerating her CPAP machine and does think she feels a little less tired. She does have trouble sleeping still.She will be allowed up to use up to a mg of Alprazolam qhs to help and we will reevaluate at next visit.  Elevated BP Numbers look  good today  Acute low back pain Has trouble only intermittently is allowed a refill on her Norco and Mobic   Anxiety and depression Agrees to try starting a low dose of Citalopram 10 mg daily to help manage her ongoing stress. She is warned regarding possible side effects

## 2011-06-07 NOTE — Assessment & Plan Note (Signed)
Numbers look good today 

## 2011-06-07 NOTE — Assessment & Plan Note (Signed)
Has trouble only intermittently is allowed a refill on her Norco and Mobic

## 2011-06-07 NOTE — Assessment & Plan Note (Signed)
Agrees to try starting a low dose of Citalopram 10 mg daily to help manage her ongoing stress. She is warned regarding possible side effects

## 2011-06-16 ENCOUNTER — Encounter: Payer: Self-pay | Admitting: Family Medicine

## 2011-06-19 ENCOUNTER — Telehealth: Payer: Self-pay

## 2011-06-19 NOTE — Telephone Encounter (Signed)
1.)Pt states she thinks the Celexa is making her more down and angry?  2.) Pt also states the xanax relaxes her but is not helping her sleep? Pt states she has been taking her husbands Ambien CR and its helping her sleep at night? Pt would like some Ambien CR sent to her pharmacy?  Please advise?

## 2011-06-19 NOTE — Telephone Encounter (Signed)
If she has never tried the Ambien regular the Zolpidem 10 mg they will make her try that first. So if never tried Zolpidem 10mg  po qhs prn insomnia, Disp#10. If she has failed it, document why it did not work or what trouble it caused and then she can have an rx for Ambien CR 12.5 mg  1 tab po qhs prn insomnia, # 20

## 2011-06-20 MED ORDER — ZOLPIDEM TARTRATE 10 MG PO TABS: 10.0000 mg | ORAL_TABLET | Freq: Every evening | ORAL | Status: DC | PRN

## 2011-06-20 NOTE — Telephone Encounter (Signed)
Well she can stop it at that dose but then she should probably come in next month to discuss her options

## 2011-06-20 NOTE — Telephone Encounter (Signed)
Ok what about the Celexa?

## 2011-06-20 NOTE — Telephone Encounter (Signed)
Left a message for patient to return my call. 

## 2011-06-20 NOTE — Telephone Encounter (Signed)
Pt informed and would just like to continue on Ambien till she sees the md. I will send in RX

## 2011-07-07 ENCOUNTER — Other Ambulatory Visit: Payer: Self-pay

## 2011-07-07 MED ORDER — ZOLPIDEM TARTRATE 10 MG PO TABS: 10.0000 mg | ORAL_TABLET | Freq: Every evening | ORAL | Status: DC | PRN

## 2011-07-07 NOTE — Telephone Encounter (Signed)
Pt states she is using the CPAP and comes in to see MD on 07-11-11

## 2011-07-07 NOTE — Telephone Encounter (Signed)
Just check with patient that she wants this,m she is using her CPAP now so if she wants more I am willing to give # 30 with 1 rf

## 2011-07-11 ENCOUNTER — Ambulatory Visit (INDEPENDENT_AMBULATORY_CARE_PROVIDER_SITE_OTHER): Payer: 59 | Admitting: Family Medicine

## 2011-07-11 ENCOUNTER — Encounter: Payer: Self-pay | Admitting: Family Medicine

## 2011-07-11 VITALS — BP 125/85 | HR 83 | Temp 98.6°F | Ht 63.0 in | Wt 197.8 lb

## 2011-07-11 DIAGNOSIS — R5381 Other malaise: Secondary | ICD-10-CM

## 2011-07-11 DIAGNOSIS — N912 Amenorrhea, unspecified: Secondary | ICD-10-CM

## 2011-07-11 DIAGNOSIS — IMO0001 Reserved for inherently not codable concepts without codable children: Secondary | ICD-10-CM

## 2011-07-11 DIAGNOSIS — R030 Elevated blood-pressure reading, without diagnosis of hypertension: Secondary | ICD-10-CM

## 2011-07-11 DIAGNOSIS — G4700 Insomnia, unspecified: Secondary | ICD-10-CM

## 2011-07-11 DIAGNOSIS — G4730 Sleep apnea, unspecified: Secondary | ICD-10-CM

## 2011-07-11 DIAGNOSIS — Z9109 Other allergy status, other than to drugs and biological substances: Secondary | ICD-10-CM

## 2011-07-11 DIAGNOSIS — Z889 Allergy status to unspecified drugs, medicaments and biological substances status: Secondary | ICD-10-CM

## 2011-07-11 DIAGNOSIS — F329 Major depressive disorder, single episode, unspecified: Secondary | ICD-10-CM

## 2011-07-11 DIAGNOSIS — R5383 Other fatigue: Secondary | ICD-10-CM

## 2011-07-11 DIAGNOSIS — F341 Dysthymic disorder: Secondary | ICD-10-CM

## 2011-07-11 DIAGNOSIS — J309 Allergic rhinitis, unspecified: Secondary | ICD-10-CM

## 2011-07-11 DIAGNOSIS — F32A Depression, unspecified: Secondary | ICD-10-CM

## 2011-07-11 LAB — RENAL FUNCTION PANEL
Albumin: 4.3 g/dL (ref 3.5–5.2)
BUN: 13 mg/dL (ref 6–23)
CO2: 23 mEq/L (ref 19–32)
Calcium: 9.6 mg/dL (ref 8.4–10.5)
Chloride: 108 mEq/L (ref 96–112)
Phosphorus: 3.5 mg/dL (ref 2.3–4.6)
Potassium: 3.8 mEq/L (ref 3.5–5.1)

## 2011-07-11 LAB — HEPATIC FUNCTION PANEL
Alkaline Phosphatase: 108 U/L (ref 39–117)
Bilirubin, Direct: 0 mg/dL (ref 0.0–0.3)
Total Bilirubin: 0.3 mg/dL (ref 0.3–1.2)
Total Protein: 8 g/dL (ref 6.0–8.3)

## 2011-07-11 MED ORDER — GUAIFENESIN ER 600 MG PO TB12: 600.0000 mg | ORAL_TABLET | Freq: Two times a day (BID) | ORAL | Status: DC

## 2011-07-11 MED ORDER — ALPRAZOLAM 0.5 MG PO TABS: 0.5000 mg | ORAL_TABLET | Freq: Three times a day (TID) | ORAL | Status: DC | PRN

## 2011-07-11 MED ORDER — CITALOPRAM HYDROBROMIDE 10 MG PO TABS: ORAL_TABLET | ORAL | Status: DC

## 2011-07-11 MED ORDER — ZOLPIDEM TARTRATE 10 MG PO TABS: 10.0000 mg | ORAL_TABLET | Freq: Every evening | ORAL | Status: DC | PRN

## 2011-07-11 MED ORDER — SALINE 0.65 % NA SOLN: 2.0000 | Freq: Two times a day (BID) | NASAL | Status: DC

## 2011-07-11 MED ORDER — FLUTICASONE FUROATE 27.5 MCG/SPRAY NA SUSP: 2.0000 | Freq: Every day | NASAL | Status: DC

## 2011-07-11 NOTE — Assessment & Plan Note (Signed)
No menstrual cycle since June and patient interested in having this evaluated, will refer to [facility] for further evaluation and treatment as indicated

## 2011-07-11 NOTE — Patient Instructions (Addendum)
Allergies, Generic Allergies may happen from anything your body is sensitive to. This may be food, medicines, pollens, chemicals, and nearly anything around you in everyday life that produces allergens. An allergen is anything that causes an allergy producing substance. Heredity is often a factor in causing these problems. This means you may have some of the same allergies as your parents. Food allergies happen in all age groups. Food allergies are some of the most severe and life threatening. Some common food allergies are cow's milk, seafood, eggs, nuts, wheat, and soybeans. SYMPTOMS   Swelling around the mouth.   An itchy red rash or hives.   Vomiting or diarrhea.   Difficulty breathing.  SEVERE ALLERGIC REACTIONS ARE LIFE-THREATENING. This reaction is called anaphylaxis. It can cause the mouth and throat to swell and cause difficulty with breathing and swallowing. In severe reactions only a trace amount of food (for example, peanut oil in a salad) may cause death within seconds. Seasonal allergies occur in all age groups. These are seasonal because they usually occur during the same season every year. They may be a reaction to molds, grass pollens, or tree pollens. Other causes of problems are house dust mite allergens, pet dander, and mold spores. The symptoms often consist of nasal congestion, a runny itchy nose associated with sneezing, and tearing itchy eyes. There is often an associated itching of the mouth and ears. The problems happen when you come in contact with pollens and other allergens. Allergens are the particles in the air that the body reacts to with an allergic reaction. This causes you to release allergic antibodies. Through a chain of events, these eventually cause you to release histamine into the blood stream. Although it is meant to be protective to the body, it is this release that causes your discomfort. This is why you were given anti-histamines to feel better. If you are  unable to pinpoint the offending allergen, it may be determined by skin or blood testing. Allergies cannot be cured but can be controlled with medicine. Hay fever is a collection of all or some of the seasonal allergy problems. It may often be treated with simple over-the-counter medicine such as diphenhydramine. Take medicine as directed. Do not drink alcohol or drive while taking this medicine. Check with your caregiver or package insert for child dosages. If these medicines are not effective, there are many new medicines your caregiver can prescribe. Stronger medicine such as nasal spray, eye drops, and corticosteroids may be used if the first things you try do not work well. Other treatments such as immunotherapy or desensitizing injections can be used if all else fails. Follow up with your caregiver if problems continue. These seasonal allergies are usually not life threatening. They are generally more of a nuisance that can often be handled using medicine. HOME CARE INSTRUCTIONS   If unsure what causes a reaction, keep a diary of foods eaten and symptoms that follow. Avoid foods that cause reactions.   If hives or rash are present:   Take medicine as directed.   You may use an over-the-counter antihistamine (diphenhydramine) for hives and itching as needed.   Apply cold compresses (cloths) to the skin or take baths in cool water. Avoid hot baths or showers. Heat will make a rash and itching worse.   If you are severely allergic:   Following a treatment for a severe reaction, hospitalization is often required for closer follow-up.   Wear a medic-alert bracelet or necklace stating the allergy.     You and your family must learn how to give adrenaline or use an anaphylaxis kit.   If you have had a severe reaction, always carry your anaphylaxis kit or EpiPen with you. Use this medicine as directed by your caregiver if a severe reaction is occurring. Failure to do so could have a fatal  outcome.  SEEK MEDICAL CARE IF:  You suspect a food allergy. Symptoms generally happen within 30 minutes of eating a food.   Your symptoms have not gone away within 2 days or are getting worse.   You develop new symptoms.   You want to retest yourself or your child with a food or drink you think causes an allergic reaction. Never do this if an anaphylactic reaction to that food or drink has happened before. Only do this under the care of a caregiver.  SEEK IMMEDIATE MEDICAL CARE IF:   You have difficulty breathing, are wheezing, or have a tight feeling in your chest or throat.   You have a swollen mouth, or you have hives, swelling, or itching all over your body.   You have had a severe reaction that has responded to your anaphylaxis kit or an EpiPen. These reactions may return when the medicine has worn off. These reactions should be considered life threatening.  MAKE SURE YOU:   Understand these instructions.   Will watch your condition.   Will get help right away if you are not doing well or get worse.  Document Released: 10/21/2002 Document Revised: 04/09/2011 Document Reviewed: 03/27/2008 Exit[employer] Patient Information 2012 [employer]. 

## 2011-07-11 NOTE — Progress Notes (Signed)
Nicole Ball [id number] 1976/05/21 07/11/2011      Progress Note-Follow Up  Subjective  Chief Complaint  Chief Complaint  Patient presents with  . Follow-up    1 month follow up    HPI  This 35 year old African female in today for followup of multiple medical problems. She feels the citalopram is helping her mood and her mostly she feels more even. She does note some are toward the end of day she begins to feel somewhat more irritable again. She is struggling less anhedonia and fatigue and denies suicidal ideation. She is using CPAP now is sleeping somewhat better with that and use of Ambien. Does feel more rested but still struggles with some fatigue. No chest pain, palpitations, shortness of breath, GI or GU complaints noted today that she is struggling with a lot of congestion. She complains of nasal congestion and postnasal drip with occasional nausea as a result. No vomiting no diarrhea no anorexia. She denies any ear pain or throat. She denies any fevers, chills or significant cough. She continues to struggle with amenorrhea has not had a period since June and is ready to have a referral to GYN for further evaluation and annual GYN exam.  Past Medical History  Diagnosis Date  . Allergy     seasonal  . Acute low back pain 12/19/2010  . Overweight 10/16/2010  . SNORING 10/16/2010  . UTI'S, HX OF 10/16/2010  . INSOMNIA 10/16/2010  . HIATAL HERNIA WITH REFLUX, HX OF 10/16/2010  . ALLERGIC RHINITIS 10/16/2010  . Hyperlipidemia 04/21/2011  . Elevated BP 04/21/2011  . Sleep apnea 06/07/2011  . Anxiety and depression 06/07/2011    History reviewed. No pertinent past surgical history.  Family History  Problem Relation Age of Onset  . Lupus Mother   . Arthritis Maternal Grandmother   . Scleroderma Maternal Grandmother   . Glaucoma Maternal Grandmother   . Cataracts Maternal Grandmother   . Cancer Maternal Grandfather     prostate  . Coronary artery disease Maternal Grandfather    s/p angioplasty  . Diabetes Maternal Grandfather     Type 2  . Cancer Paternal Grandmother     Breast ca- dx in 42's  . Thyroid disease Paternal Grandmother   . Diabetes Paternal Grandfather   . Thyroid disease Sister   . Asthma Sister   . Allergies Brother   . Arthritis Other     History   Social History  . Marital Status: Married    Spouse Name: N/A    Number of Children: N/A  . Years of Education: N/A   Occupational History  . Not on file.   Social History Main Topics  . Smoking status: Current Some Day Smoker -- 0.1 packs/day    Types: Cigars  . Smokeless tobacco: Not on file   Comment: 1 black and milds  . Alcohol Use: Yes     occasional  . Drug Use: No  . Sexually Active: Yes -- Female partner(s)   Other Topics Concern  . Not on file   Social History Narrative  . No narrative on file    Current Outpatient Prescriptions on File Prior to Visit  Medication Sig Dispense Refill  . cetirizine (ZYRTEC) 10 MG tablet Take 10 mg by mouth daily as needed. For allergies       . cyclobenzaprine (FLEXERIL) 10 MG tablet Take 1 tablet (10 mg total) by mouth 2 (two) times daily as needed for muscle spasms.  60 tablet  1  .  HYDROcodone-acetaminophen (NORCO) 5-325 MG per tablet Take 1 tablet by mouth 2 (two) times daily as needed. 1/2 to 1 tab po bid prn for severe pain  60 tablet  1  . ibuprofen (ADVIL,MOTRIN) 200 MG tablet Take 200 mg by mouth as needed.        . meloxicam (MOBIC) 15 MG tablet Take 1 tablet (15 mg total) by mouth daily.  30 tablet  2  . omeprazole (PRILOSEC) 20 MG capsule Take 1 capsule (20 mg total) by mouth daily.  30 capsule  3  . sodium chloride (OCEAN) 0.65 % nasal spray Place 1 spray into the nose daily as needed.       Marland Kitchen guaiFENesin (MUCINEX) 600 MG 12 hr tablet Take 1,200 mg by mouth 2 (two) times daily. As needed X 10 days        No Known Allergies  Review of Systems  Review of Systems  Constitutional: Positive for malaise/fatigue. Negative for  fever.  HENT: Positive for congestion. Negative for sore throat.   Eyes: Negative for discharge.  Respiratory: Negative for cough, shortness of breath and wheezing.   Cardiovascular: Negative for chest pain, palpitations and leg swelling.  Gastrointestinal: Negative for nausea, abdominal pain and diarrhea.  Genitourinary: Negative for dysuria.  Musculoskeletal: Negative for falls.  Skin: Negative for rash.  Neurological: Negative for loss of consciousness and headaches.  Endo/Heme/Allergies: Negative for polydipsia.  Psychiatric/Behavioral: Positive for depression. Negative for suicidal ideas and substance abuse. The patient is nervous/anxious and has insomnia.     Objective  BP 125/85  Pulse 83  Temp(Src) 98.6 F (37 C) (Oral)  Ht 5\' 3"  (1.6 m)  Wt 197 lb 12.8 oz (89.721 kg)  BMI 35.04 kg/m2  SpO2 97%  LMP 07/11/2011  Physical Exam  Physical Exam  Constitutional: She is well-developed, well-nourished, and in no distress. No distress.  HENT:  Left Ear: External ear normal.  Mouth/Throat: No oropharyngeal exudate.       Nasal mucosa boggy and erythematous  Eyes: EOM are normal. Left eye exhibits no discharge. No scleral icterus.  Neck: No JVD present. No tracheal deviation present.  Cardiovascular: Normal heart sounds and intact distal pulses.   Pulmonary/Chest: No respiratory distress. She has no rales.  Abdominal: She exhibits no distension and no mass. There is no tenderness. There is no guarding.  Musculoskeletal: She exhibits no edema and no tenderness.  Lymphadenopathy:    She has no cervical adenopathy.  Skin: No rash noted. No erythema.    Lab Results  Component Value Date   TSH 0.80 04/18/2011   Lab Results  Component Value Date   WBC 5.0 04/18/2011   HGB 12.8 04/18/2011   HCT 39.2 04/18/2011   MCV 87.3 04/18/2011   PLT 182.0 04/18/2011   Lab Results  Component Value Date   CREATININE 0.6 04/18/2011   BUN 17 04/18/2011   NA 139 04/18/2011   K 4.0 04/18/2011   CL 108  04/18/2011   CO2 21 04/18/2011   Lab Results  Component Value Date   ALT 21 04/18/2011   AST 20 04/18/2011   ALKPHOS 133* 04/18/2011   BILITOT 0.4 04/18/2011   Lab Results  Component Value Date   CHOL 196 04/18/2011   Lab Results  Component Value Date   HDL 62.80 04/18/2011   Lab Results  Component Value Date   LDLCALC 108* 04/18/2011   Lab Results  Component Value Date   TRIG 128.0 04/18/2011   Lab Results  Component  Value Date   CHOLHDL 3 04/18/2011     Assessment & Plan  Anxiety and depression Citalopram 10 mg is helping some, takes qhs, feels more irritable by end of day will try increasing to 15 mg daily and is starting a counseling session next week. May continue to use Alprazolam prn for anxiety  Elevated BP Good control today, no need for furhter medications  ALLERGIC RHINITIS Flared up at the moment, start Mucinex bid, Cetirizine daily, Nasal saline a couple times a day and given a sample of Veramyst to see if this gives her relief. Can consider a prescription for Fluticasone if she is responsive  Sleep apnea Using her CPAP routinely and tolerating it well  INSOMNIA Responding well to Zolpidem 10 mg po qhs prn insomnia

## 2011-07-11 NOTE — Assessment & Plan Note (Signed)
Responding well to Zolpidem 10 mg po qhs prn insomnia

## 2011-07-11 NOTE — Assessment & Plan Note (Signed)
Flared up at the moment, start Mucinex bid, Cetirizine daily, Nasal saline a couple times a day and given a sample of Veramyst to see if this gives her relief. Can consider a prescription for Fluticasone if she is responsive

## 2011-07-11 NOTE — Assessment & Plan Note (Addendum)
Citalopram 10 mg is helping some, takes qhs, feels more irritable by end of day will try increasing to 15 mg daily and is starting a counseling session next week. May continue to use Alprazolam prn for anxiety

## 2011-07-11 NOTE — Assessment & Plan Note (Signed)
Using her CPAP routinely and tolerating it well

## 2011-07-11 NOTE — Assessment & Plan Note (Signed)
Good control today, no need for furhter medications

## 2011-07-23 ENCOUNTER — Encounter: Payer: Self-pay | Admitting: Family Medicine

## 2011-09-26 ENCOUNTER — Telehealth: Payer: Self-pay | Admitting: Family Medicine

## 2011-09-26 NOTE — Telephone Encounter (Signed)
Pt has 3 questions: 1-Gyn started pt on OCP (Lo-Lo-estrin) today and pt would like to know if Dr. Abner Greenspan thinks this is OK.  Advised pt that if Gyn has prescribed this medication for her she has evaluated pt risk factors and feels it would be OK. 2-Pt is going out of town for a funeral and needs refill on hydrocodone.  Pt is at pharmacy now.  Pt believes pharmacy may have shorted her one refill.  Advised pt Dr. Abner Greenspan out of office and Dr. Milinda Cave is with patients and she should not wait at pharmacy for refill to be called in.  She will go to [facility] in city they are going to and have RX transferred.  Last filled 10/26--#60 x 1 3-Pt would like to know if she has refills left on Meloxicam.    Advised pt refills were sent and if she has refills remaining at pharmacy they should not be expired.  Pt to request refill from pharmacy.  Please advise refill on hydrocodone.

## 2011-10-21 ENCOUNTER — Other Ambulatory Visit: Payer: Self-pay

## 2011-10-21 NOTE — Telephone Encounter (Signed)
Called patient to schedule a follow up appt./nta

## 2011-10-21 NOTE — Telephone Encounter (Signed)
Pt would like to know if she should schedule an appt or if MD will send refills to pharmacy. After looking at pts office notes it states on 07-11-11 for patient to return in 1 month to review medication. Lowella Bandy is calling patient to advise her.

## 2011-10-27 ENCOUNTER — Ambulatory Visit: Payer: 59 | Admitting: Family Medicine

## 2011-10-28 ENCOUNTER — Ambulatory Visit (INDEPENDENT_AMBULATORY_CARE_PROVIDER_SITE_OTHER): Payer: PRIVATE HEALTH INSURANCE | Admitting: Family Medicine

## 2011-10-28 ENCOUNTER — Encounter: Payer: Self-pay | Admitting: Family Medicine

## 2011-10-28 VITALS — BP 119/83 | HR 84 | Temp 97.4°F | Wt 190.0 lb

## 2011-10-28 DIAGNOSIS — M549 Dorsalgia, unspecified: Secondary | ICD-10-CM

## 2011-10-28 DIAGNOSIS — N912 Amenorrhea, unspecified: Secondary | ICD-10-CM

## 2011-10-28 DIAGNOSIS — G4700 Insomnia, unspecified: Secondary | ICD-10-CM

## 2011-10-28 DIAGNOSIS — F32A Depression, unspecified: Secondary | ICD-10-CM

## 2011-10-28 DIAGNOSIS — K219 Gastro-esophageal reflux disease without esophagitis: Secondary | ICD-10-CM

## 2011-10-28 DIAGNOSIS — F419 Anxiety disorder, unspecified: Secondary | ICD-10-CM

## 2011-10-28 DIAGNOSIS — IMO0001 Reserved for inherently not codable concepts without codable children: Secondary | ICD-10-CM

## 2011-10-28 DIAGNOSIS — E119 Type 2 diabetes mellitus without complications: Secondary | ICD-10-CM

## 2011-10-28 DIAGNOSIS — F341 Dysthymic disorder: Secondary | ICD-10-CM

## 2011-10-28 DIAGNOSIS — J029 Acute pharyngitis, unspecified: Secondary | ICD-10-CM

## 2011-10-28 DIAGNOSIS — R030 Elevated blood-pressure reading, without diagnosis of hypertension: Secondary | ICD-10-CM

## 2011-10-28 MED ORDER — GLUCOSE BLOOD VI STRP: ORAL_STRIP | Status: DC

## 2011-10-28 MED ORDER — CITALOPRAM HYDROBROMIDE 20 MG PO TABS: 20.0000 mg | ORAL_TABLET | Freq: Every day | ORAL | Status: DC

## 2011-10-28 MED ORDER — ZOLPIDEM TARTRATE 10 MG PO TABS: 10.0000 mg | ORAL_TABLET | Freq: Every evening | ORAL | Status: DC | PRN

## 2011-10-28 MED ORDER — CYCLOBENZAPRINE HCL 10 MG PO TABS: 10.0000 mg | ORAL_TABLET | Freq: Two times a day (BID) | ORAL | Status: DC | PRN

## 2011-10-28 MED ORDER — FREESTYLE LANCETS MISC: Status: DC

## 2011-10-28 MED ORDER — HYDROCODONE-ACETAMINOPHEN 5-325 MG PO TABS: 1.0000 | ORAL_TABLET | Freq: Two times a day (BID) | ORAL | Status: DC | PRN

## 2011-10-28 MED ORDER — OMEPRAZOLE 20 MG PO CPDR: 20.0000 mg | DELAYED_RELEASE_CAPSULE | Freq: Every day | ORAL | Status: DC

## 2011-10-28 MED ORDER — ALPRAZOLAM 0.5 MG PO TABS: 0.5000 mg | ORAL_TABLET | Freq: Three times a day (TID) | ORAL | Status: DC | PRN

## 2011-10-28 MED ORDER — AMOXICILLIN 500 MG PO CAPS: 500.0000 mg | ORAL_CAPSULE | Freq: Three times a day (TID) | ORAL | Status: AC

## 2011-10-28 MED ORDER — AZITHROMYCIN 250 MG PO TABS: ORAL_TABLET | ORAL | Status: DC

## 2011-10-28 NOTE — Patient Instructions (Signed)

## 2011-10-30 ENCOUNTER — Encounter: Payer: Self-pay | Admitting: Family Medicine

## 2011-10-30 DIAGNOSIS — E1169 Type 2 diabetes mellitus with other specified complication: Secondary | ICD-10-CM

## 2011-10-30 DIAGNOSIS — E669 Obesity, unspecified: Secondary | ICD-10-CM

## 2011-10-30 HISTORY — DX: Obesity, unspecified: E66.9

## 2011-10-30 HISTORY — DX: Type 2 diabetes mellitus with other specified complication: E11.69

## 2011-10-30 NOTE — Assessment & Plan Note (Signed)
Is following with Dr Eustaquio Boyden and they have started Loestrin to try and restart her cycles, has not had a cycle since las may. She has just strted her second pack but still no menstrual cycle

## 2011-10-30 NOTE — Assessment & Plan Note (Signed)
Is seeing Dr Talmage Nap of endocrine and her HGBA1c was 6.7 so she has been given the diagnosis of DM and started on Metformin, she does not have a glucometer so she is given one today with lancets and test strips and asked to check her sugars daily and as needed. Avoid simple carbs,

## 2011-10-30 NOTE — Assessment & Plan Note (Signed)
Adequately controlled today 

## 2011-10-30 NOTE — Progress Notes (Signed)
Patient ID: Nicole Ball, female   DOB: [birth year], 36 y.o.   MRN: [id number] Nicole Ball [id number] [birth year] 10/30/2011      Progress Note-Follow Up  Subjective  Chief Complaint  Chief Complaint  Patient presents with  . Follow-up    fatigue, anxiety, insomina    HPI  Patient is a 36 year old [place] American female in today for followup of multiple medical problems. She'll she's doing somewhat better. She is following with endocrinology now and has been started on that appointment. Hemoglobin A1c of 6.7. She continues to be amenorrheic but is following with OB/GYN. Started Loestrin but still has not had her menstrual cycle starts. No recent illness, fevers, chills, chest pain, palpitations. She continues to struggle with fatigue difficulty sleeping and high anxiety levels.  Past Medical History  Diagnosis Date  . Allergy     seasonal  . Acute low back pain 12/19/2010  . Overweight 10/16/2010  . SNORING 10/16/2010  . UTI'S, HX OF 10/16/2010  . INSOMNIA 10/16/2010  . HIATAL HERNIA WITH REFLUX, HX OF 10/16/2010  . ALLERGIC RHINITIS 10/16/2010  . Hyperlipidemia 04/21/2011  . Elevated BP 04/21/2011  . Sleep apnea 06/07/2011  . Anxiety and depression 06/07/2011  . Amenorrhea 07/11/2011  . Diabetes mellitus 10/30/2011    History reviewed. No pertinent past surgical history.  Family History  Problem Relation Age of Onset  . Lupus Mother   . Arthritis Maternal Grandmother   . Scleroderma Maternal Grandmother   . Glaucoma Maternal Grandmother   . Cataracts Maternal Grandmother   . Cancer Maternal Grandfather     prostate  . Coronary artery disease Maternal Grandfather     s/p angioplasty  . Diabetes Maternal Grandfather     Type 2  . Cancer Paternal Grandmother     Breast ca- dx in 74's  . Thyroid disease Paternal Grandmother   . Diabetes Paternal Grandfather   . Thyroid disease Sister   . Asthma Sister   . Allergies Brother   . Arthritis Other     History   Social  History  . Marital Status: Married    Spouse Name: N/A    Number of Children: N/A  . Years of Education: N/A   Occupational History  . Not on file.   Social History Main Topics  . Smoking status: Current Some Day Smoker -- 0.1 packs/day    Types: Cigars  . Smokeless tobacco: Not on file   Comment: 1 black and milds  . Alcohol Use: Yes     occasional  . Drug Use: No  . Sexually Active: Yes -- Female partner(s)   Other Topics Concern  . Not on file   Social History Narrative  . No narrative on file    Current Outpatient Prescriptions on File Prior to Visit  Medication Sig Dispense Refill  . cetirizine (ZYRTEC) 10 MG tablet Take 10 mg by mouth daily as needed. For allergies       . fluticasone (VERAMYST) 27.5 MCG/SPRAY nasal spray Place 2 sprays into the nose daily.  10 g  0  . guaiFENesin (MUCINEX) 600 MG 12 hr tablet Take 1 tablet (600 mg total) by mouth 2 (two) times daily.  60 tablet  0  . ibuprofen (ADVIL,MOTRIN) 200 MG tablet Take 200 mg by mouth as needed.        . meloxicam (MOBIC) 15 MG tablet Take 1 tablet (15 mg total) by mouth daily.  30 tablet  2  . Saline 0.65 % (SOLN)  SOLN Place 2 Squirts into the nose 2 (two) times daily.  1 Bottle  0  . sodium chloride (OCEAN) 0.65 % nasal spray Place 1 spray into the nose daily as needed.       Marland Kitchen guaiFENesin (MUCINEX) 600 MG 12 hr tablet Take 1,200 mg by mouth 2 (two) times daily. As needed X 10 days        No Known Allergies  Review of Systems  Review of Systems  Constitutional: Positive for malaise/fatigue. Negative for fever.  HENT: Positive for neck pain. Negative for congestion.   Eyes: Negative for discharge.  Respiratory: Negative for shortness of breath.   Cardiovascular: Negative for chest pain, palpitations and leg swelling.  Gastrointestinal: Negative for nausea, abdominal pain and diarrhea.  Genitourinary: Negative for dysuria.  Musculoskeletal: Positive for myalgias, back pain and joint pain. Negative for  falls.  Skin: Negative for rash.  Neurological: Negative for loss of consciousness and headaches.  Endo/Heme/Allergies: Negative for polydipsia.  Psychiatric/Behavioral: Negative for depression and suicidal ideas. The patient is not nervous/anxious and does not have insomnia.     Objective  BP 119/83  Pulse 84  Temp(Src) 97.4 F (36.3 C) (Oral)  Wt 190 lb (86.183 kg)  SpO2 99%  Physical Exam  Physical Exam  Constitutional: She is oriented to person, place, and time and well-developed, well-nourished, and in no distress. No distress.  HENT:  Head: Normocephalic and atraumatic.  Eyes: Conjunctivae are normal.  Neck: Neck supple. No thyromegaly present.  Cardiovascular: Normal rate, regular rhythm and normal heart sounds.   No murmur heard. Pulmonary/Chest: Effort normal and breath sounds normal. She has no wheezes.  Abdominal: She exhibits no distension and no mass.  Musculoskeletal: She exhibits no edema.  Lymphadenopathy:    She has no cervical adenopathy.  Neurological: She is alert and oriented to person, place, and time.  Skin: Skin is warm and dry. No rash noted. She is not diaphoretic.  Psychiatric: Memory, affect and judgment normal.    Lab Results  Component Value Date   TSH 0.80 04/18/2011   Lab Results  Component Value Date   WBC 5.0 04/18/2011   HGB 12.8 04/18/2011   HCT 39.2 04/18/2011   MCV 87.3 04/18/2011   PLT 182.0 04/18/2011   Lab Results  Component Value Date   CREATININE 0.7 07/11/2011   BUN 13 07/11/2011   NA 140 07/11/2011   K 3.8 07/11/2011   CL 108 07/11/2011   CO2 23 07/11/2011   Lab Results  Component Value Date   ALT 23 07/11/2011   AST 21 07/11/2011   ALKPHOS 108 07/11/2011   BILITOT 0.3 07/11/2011   Lab Results  Component Value Date   CHOL 196 04/18/2011   Lab Results  Component Value Date   HDL 62.80 04/18/2011   Lab Results  Component Value Date   LDLCALC 108* 04/18/2011   Lab Results  Component Value Date   TRIG 128.0 04/18/2011     Lab Results  Component Value Date   CHOLHDL 3 04/18/2011     Assessment & Plan  Amenorrhea Is following with Dr Eustaquio Boyden and they have started Loestrin to try and restart her cycles, has not had a cycle since las may. She has just strted her second pack but still no menstrual cycle  Elevated BP Adequately controlled today.  Diabetes mellitus Is seeing Dr Talmage Nap of endocrine and her HGBA1c was 6.7 so she has been given the diagnosis of DM and started on Metformin, she  does not have a glucometer so she is given one today with lancets and test strips and asked to check her sugars daily and as needed. Avoid simple carbs,

## 2011-11-03 ENCOUNTER — Telehealth: Payer: Self-pay | Admitting: *Deleted

## 2011-11-03 NOTE — Telephone Encounter (Signed)
Please check with pharmacy and see why they declined they usually cover this one, if they will not try Temazepam 7.5 mg po qhs, #30, 2 rf

## 2011-11-03 NOTE — Telephone Encounter (Signed)
PC from pt.  She has spoken with insurance company and they do not cover any sleep medication.  Would like to know if we have any recommendations of alternatives.  Please advise.

## 2011-11-04 MED ORDER — TEMAZEPAM 7.5 MG PO CAPS: 7.5000 mg | ORAL_CAPSULE | Freq: Every evening | ORAL | Status: DC | PRN

## 2011-11-04 NOTE — Telephone Encounter (Signed)
I have attempted to contact this patient by phone with the following results: left message to return my call on answering machine.

## 2011-11-04 NOTE — Telephone Encounter (Signed)
Addended by: Luisa Dago on: 11/04/2011 04:37 PM   Modules accepted: Orders

## 2011-11-05 ENCOUNTER — Telehealth: Payer: Self-pay

## 2011-11-05 NOTE — Telephone Encounter (Signed)
Pharmacist from [facility] called stating that the Temazepam 7.5mg  was going to be $250. Pharmacist would like to know if the 15mg  could be called in due to it costing only $10 or $15? Per MD that is fine.

## 2011-11-24 ENCOUNTER — Telehealth: Payer: Self-pay

## 2011-11-24 MED ORDER — QUETIAPINE FUMARATE 25 MG PO TABS: 25.0000 mg | ORAL_TABLET | Freq: Every day | ORAL | Status: DC

## 2011-11-24 NOTE — Telephone Encounter (Signed)
Patient left a message stating that the Temazepam is not helping. Patient stated she has to take 3 of the 15 mg to get her to sleep then she still wakes up groggy. Pt states she has been taking 1/2 of her husbands Seroquels and it seems to get her a good night sleep. Could an RX be called in for that?  Pt also stated that she would like to pick up a new diabetic meter because the Freestyle she was given is expensive for the test strips?  Please advise?  Callback number [phone]

## 2011-11-24 NOTE — Telephone Encounter (Signed)
Did she call the 1 800 number with the freestyle, they usually can make it work out if she does that otherwise we will just call a generic glucometer to her pharmacy and she should ask her pharmacist which one her insurance will cover the cheapest. OK to give her Seroquel 25 mg qhs, #30, 1 rf

## 2011-11-25 NOTE — Telephone Encounter (Signed)
Left a message for patient to return my call. 

## 2011-11-25 NOTE — Telephone Encounter (Signed)
Patient called and left a message returning my call. I called pt and left another to return my call.

## 2011-11-28 NOTE — Telephone Encounter (Signed)
Patient informed and states the Seroquel is working good.

## 2011-12-24 ENCOUNTER — Encounter: Payer: PRIVATE HEALTH INSURANCE | Attending: Endocrinology | Admitting: *Deleted

## 2011-12-25 ENCOUNTER — Encounter: Payer: Self-pay | Admitting: *Deleted

## 2012-01-13 ENCOUNTER — Other Ambulatory Visit: Payer: Self-pay | Admitting: *Deleted

## 2012-01-13 DIAGNOSIS — M549 Dorsalgia, unspecified: Secondary | ICD-10-CM

## 2012-01-13 MED ORDER — HYDROCODONE-ACETAMINOPHEN 5-325 MG PO TABS: 1.0000 | ORAL_TABLET | Freq: Two times a day (BID) | ORAL | Status: DC | PRN

## 2012-01-13 NOTE — Telephone Encounter (Signed)
Faxed refill request received from pharmacy for hydrocodone Last filled by MD on 10/28/11, #60 x 2 Last seen on 10/28/11 Follow up in 01/2012, no appt in computer Please advise refills.

## 2012-01-20 ENCOUNTER — Other Ambulatory Visit: Payer: Self-pay

## 2012-01-20 MED ORDER — MELOXICAM 15 MG PO TABS: 15.0000 mg | ORAL_TABLET | Freq: Every day | ORAL | Status: DC

## 2012-01-20 NOTE — Telephone Encounter (Signed)
RX sent and pt stated she doesn't take the Ibuprofen any longer

## 2012-01-20 NOTE — Telephone Encounter (Signed)
Pharmacy is requesting Meloxicam refill for patient? I don't see this on med list? Please advise

## 2012-01-20 NOTE — Telephone Encounter (Signed)
It was in her list but expired last month. I am OK to refill it but her note does says she takes an occasional Ibuprofen so we just need to clarify that she cannot take both on the same day before we refill the Meloxicam. Otherwise she can stay on the same dose, give #30 with 5 rf

## 2012-02-04 ENCOUNTER — Other Ambulatory Visit: Payer: Self-pay

## 2012-02-04 DIAGNOSIS — F329 Major depressive disorder, single episode, unspecified: Secondary | ICD-10-CM

## 2012-02-04 DIAGNOSIS — F32A Depression, unspecified: Secondary | ICD-10-CM

## 2012-02-04 MED ORDER — ALPRAZOLAM 0.5 MG PO TABS: 0.5000 mg | ORAL_TABLET | Freq: Three times a day (TID) | ORAL | Status: DC | PRN

## 2012-02-04 NOTE — Telephone Encounter (Signed)
Please advise refill? Last RX wrote on 10-28-11 quantity 60 with 3 refills.

## 2012-02-04 NOTE — Telephone Encounter (Signed)
Faxed to pharmacy

## 2012-02-10 ENCOUNTER — Other Ambulatory Visit: Payer: Self-pay

## 2012-02-10 MED ORDER — QUETIAPINE FUMARATE 25 MG PO TABS: 25.0000 mg | ORAL_TABLET | Freq: Every day | ORAL | Status: DC

## 2012-02-10 NOTE — Telephone Encounter (Signed)
Last RX wrote on 11-24-11 quantity 30 with 1 refill

## 2012-02-23 ENCOUNTER — Other Ambulatory Visit: Payer: Self-pay

## 2012-02-23 DIAGNOSIS — F32A Depression, unspecified: Secondary | ICD-10-CM

## 2012-02-23 DIAGNOSIS — F329 Major depressive disorder, single episode, unspecified: Secondary | ICD-10-CM

## 2012-02-23 MED ORDER — CITALOPRAM HYDROBROMIDE 20 MG PO TABS: 20.0000 mg | ORAL_TABLET | Freq: Every day | ORAL | Status: DC

## 2012-03-10 ENCOUNTER — Ambulatory Visit
Admission: RE | Admit: 2012-03-10 | Discharge: 2012-03-10 | Disposition: A | Discharge status: Home / Self Care | Payer: PRIVATE HEALTH INSURANCE | Source: Ambulatory Visit | Attending: Family Medicine | Admitting: Family Medicine

## 2012-03-10 ENCOUNTER — Encounter: Payer: Self-pay | Admitting: Family Medicine

## 2012-03-10 ENCOUNTER — Ambulatory Visit (INDEPENDENT_AMBULATORY_CARE_PROVIDER_SITE_OTHER): Payer: PRIVATE HEALTH INSURANCE | Admitting: Family Medicine

## 2012-03-10 ENCOUNTER — Ambulatory Visit (HOSPITAL_BASED_OUTPATIENT_CLINIC_OR_DEPARTMENT_OTHER): Admission: RE | Admit: 2012-03-10 | Payer: PRIVATE HEALTH INSURANCE | Source: Ambulatory Visit

## 2012-03-10 VITALS — BP 131/85 | HR 91 | Temp 97.0°F | Ht 63.0 in | Wt 188.0 lb

## 2012-03-10 DIAGNOSIS — F419 Anxiety disorder, unspecified: Secondary | ICD-10-CM

## 2012-03-10 DIAGNOSIS — R030 Elevated blood-pressure reading, without diagnosis of hypertension: Secondary | ICD-10-CM

## 2012-03-10 DIAGNOSIS — M25562 Pain in left knee: Secondary | ICD-10-CM

## 2012-03-10 DIAGNOSIS — G4700 Insomnia, unspecified: Secondary | ICD-10-CM

## 2012-03-10 DIAGNOSIS — F341 Dysthymic disorder: Secondary | ICD-10-CM

## 2012-03-10 DIAGNOSIS — M549 Dorsalgia, unspecified: Secondary | ICD-10-CM

## 2012-03-10 DIAGNOSIS — IMO0001 Reserved for inherently not codable concepts without codable children: Secondary | ICD-10-CM

## 2012-03-10 DIAGNOSIS — M25569 Pain in unspecified knee: Secondary | ICD-10-CM

## 2012-03-10 DIAGNOSIS — F32A Depression, unspecified: Secondary | ICD-10-CM

## 2012-03-10 MED ORDER — ZOLPIDEM TARTRATE 10 MG PO TABS: 10.0000 mg | ORAL_TABLET | Freq: Every evening | ORAL | Status: DC | PRN

## 2012-03-10 MED ORDER — HYDROCODONE-ACETAMINOPHEN 5-325 MG PO TABS: 1.0000 | ORAL_TABLET | Freq: Four times a day (QID) | ORAL | Status: DC | PRN

## 2012-03-10 MED ORDER — ALPRAZOLAM 0.5 MG PO TABS: 0.5000 mg | ORAL_TABLET | Freq: Three times a day (TID) | ORAL | Status: DC | PRN

## 2012-03-10 NOTE — Patient Instructions (Signed)
Baker's Cyst  A Baker's cyst is a swelling that forms in the back of the knee. It is a sac-like structure. It is filled with the same fluid that is located in your knee. The fluid located in your knee is necessary because it lubricates the bones and cartilage. It allows them to move over each other more easily.  CAUSES   When the knee becomes injured or has soreness (inflammation) present, more fluid forms in the knee. When this happens, the joint lining is pushed out behind the knee and forms the baker's cyst. This cyst may also be caused by inflammation from arthritic conditions and infections.  DIAGNOSIS   A Baker's cyst is most often diagnosed with an ultrasound. This is a specialized picture (like an X-ray). It shows a picture by using sound waves. Sometimes a specialized x-ray called an MRI (magnetic resonance imaging) is used. This picks up other problems within a joint if an ultrasound alone cannot make the diagnosis. If the cyst came immediately following an injury, plain x-rays may be used to make a diagnosis.  TREATMENT   The treatment depends on the cause of the cyst. But most of these cysts are caused by an inflammation. Anti-inflammatory medications and rest often will get rid of the problem. If the cyst is caused by an infection, medications (antibiotics) will be prescribed to help this. Take the medications as directed. Refer to Home Care Instructions, below, for additional treatment suggestions.  HOME CARE INSTRUCTIONS    If the cyst was caused by an injury, for the first 24 hours, while lying down, keep the injured extremity elevated on 2 pillows.   For the first 24 hours while you are awake, apply ice bags (ice in a plastic bag with a towel around it to prevent frostbite to skin) 3 to 4 times per day for 15 to 20 minutes to the injured area. Then do as directed by your caregiver.   Only take over-the-counter or prescription medicines for pain, discomfort, or fever as directed by your  caregiver.  Persistent pain and inability to use the injured area for more than 2 to 3 days are warning signs indicating that you should see a caregiver for a follow-up visit as soon as possible. Persistent pain and swelling indicate that further evaluation, non-weight bearing (use of crutches as instructed), and/or further x-rays are needed. Make a follow-up appointment with your own caregiver.  If conservative measures (rest, medications and inactivity) do not help the problem get better, sometimes surgery for removal of the cyst is needed. Reasons for this may be that the cyst is pressing on nerves and/or vessels and causing problems which cannot wait for improvement with conservative treatment. If the problem is caused by injuries to the cartilage in the knee, surgery is often needed for treatment of that problem.  MAKE SURE YOU:    Understand these instructions.   Will watch your condition.   Will get help right away if you are not doing well or get worse.  Document Released: 07/28/2005 Document Revised: 07/17/2011 Document Reviewed: 03/15/2008  [employer] Patient Information 2012 [employer]

## 2012-03-11 ENCOUNTER — Encounter: Payer: Self-pay | Admitting: Family Medicine

## 2012-03-11 ENCOUNTER — Other Ambulatory Visit: Payer: Self-pay

## 2012-03-11 DIAGNOSIS — M549 Dorsalgia, unspecified: Secondary | ICD-10-CM

## 2012-03-11 DIAGNOSIS — M25562 Pain in left knee: Secondary | ICD-10-CM | POA: Insufficient documentation

## 2012-03-11 HISTORY — DX: Pain in left knee: M25.562

## 2012-03-11 NOTE — Assessment & Plan Note (Addendum)
Warm, swollen and painful without any obvious trauma. Xray is taken today and patient is encouraged to wrap it, referred to ortho for further intervention. Is out of her Meloxicam, she is asked to restart that and may continue Norco prn

## 2012-03-11 NOTE — Progress Notes (Signed)
Patient ID: Nicole Ball, female   DOB: [birth year], 36 y.o.   MRN: [id number] Nicole Ball [id number] 09/03/1975 03/11/2012      Progress Note-Follow Up  Subjective  Chief Complaint  Chief Complaint  Patient presents with  . problems with left knee    X 1 month    HPI  Complaining of some left knee pain. She denies any trauma but the pain is persistent for several weeks now. She does note diffuse swelling but denies any joint line tenderness. There's been some mild warmth but no obvious redness. No calf tenderness. Complaining of generalized myalgias as well with difficulty with right knee. She is tearful today she just didn't told the grandmother who raised her stroke in his hospital. Otherwise she feels the Celexa is working well. She was overly sedated with Seroquel so switched back to alprazolam for sleep and that has been working. She is interested in restarting Zolpidem as that seemed to work the past. No chest pain, palpitations, shortness of breath, GI or GU complaints  Past Medical History  Diagnosis Date  . Allergy     seasonal  . Acute low back pain 12/19/2010  . Overweight 10/16/2010  . SNORING 10/16/2010  . UTI'S, HX OF 10/16/2010  . INSOMNIA 10/16/2010  . HIATAL HERNIA WITH REFLUX, HX OF 10/16/2010  . ALLERGIC RHINITIS 10/16/2010  . Hyperlipidemia 04/21/2011  . Elevated BP 04/21/2011  . Sleep apnea 06/07/2011  . Anxiety and depression 06/07/2011  . Amenorrhea 07/11/2011  . Diabetes mellitus 10/30/2011  . Knee pain, left 03/11/2012    History reviewed. No pertinent past surgical history.  Family History  Problem Relation Age of Onset  . Lupus Mother   . Arthritis Maternal Grandmother   . Scleroderma Maternal Grandmother   . Glaucoma Maternal Grandmother   . Cataracts Maternal Grandmother   . Cancer Maternal Grandfather     prostate  . Coronary artery disease Maternal Grandfather     s/p angioplasty  . Diabetes Maternal Grandfather     Type 2  . Cancer Paternal  Grandmother     Breast ca- dx in 50's  . Thyroid disease Paternal Grandmother   . Diabetes Paternal Grandfather   . Thyroid disease Sister   . Asthma Sister   . Allergies Brother   . Arthritis Other     History   Social History  . Marital Status: Married    Spouse Name: N/A    Number of Children: N/A  . Years of Education: N/A   Occupational History  . Not on file.   Social History Main Topics  . Smoking status: Current Some Day Smoker -- 0.1 packs/day    Types: Cigars  . Smokeless tobacco: Never Used   Comment: 1 black and milds  . Alcohol Use: Yes     occasional  . Drug Use: No  . Sexually Active: Yes -- Female partner(s)   Other Topics Concern  . Not on file   Social History Narrative  . No narrative on file    Current Outpatient Prescriptions on File Prior to Visit  Medication Sig Dispense Refill  . citalopram (CELEXA) 20 MG tablet Take 1 tablet (20 mg total) by mouth daily.  30 tablet  2  . cyclobenzaprine (FLEXERIL) 10 MG tablet Take 1 tablet (10 mg total) by mouth 2 (two) times daily as needed for muscle spasms.  60 tablet  5  . glucose blood (FREESTYLE LITE) test strip Use as instructed, test daily and once  daily prn symptoms  100 each  5  . Lancets (FREESTYLE) lancets Use as instructed. Test daily and once daily as needed  100 each  5  . meloxicam (MOBIC) 15 MG tablet Take 1 tablet (15 mg total) by mouth daily.  30 tablet  5  . METFORMIN HCL PO Take by mouth.      . Norethindrone Acetate-Ethinyl Estradiol (LOESTRIN 1.5/30, 21,) 1.5-30 MG-MCG tablet Take 1 tablet by mouth daily.      Marland Kitchen omeprazole (PRILOSEC) 20 MG capsule Take 1 capsule (20 mg total) by mouth daily.  30 capsule  5  . fluticasone (VERAMYST) 27.5 MCG/SPRAY nasal spray Place 2 sprays into the nose daily.  10 g  0  . Saline 0.65 % (SOLN) SOLN Place 2 Squirts into the nose 2 (two) times daily.  1 Bottle  0    No Known Allergies  Review of Systems  Review of Systems  Constitutional: Negative  for fever and malaise/fatigue.  HENT: Negative for congestion.   Eyes: Negative for discharge.  Respiratory: Negative for shortness of breath.   Cardiovascular: Negative for chest pain, palpitations and leg swelling.  Gastrointestinal: Negative for nausea, abdominal pain and diarrhea.  Genitourinary: Negative for dysuria.  Musculoskeletal: Positive for joint pain. Negative for falls.       Left knee pain, swelling, warm  Skin: Negative for rash.  Neurological: Negative for loss of consciousness and headaches.  Endo/Heme/Allergies: Negative for polydipsia.  Psychiatric/Behavioral: Positive for depression. Negative for suicidal ideas. The patient is nervous/anxious and has insomnia.     Objective  BP 131/85  Pulse 91  Temp 97 F (36.1 C) (Temporal)  Ht 5\' 3"  (1.6 m)  Wt 188 lb (85.276 kg)  BMI 33.30 kg/m2  SpO2 98%  LMP 02/08/2012  Physical Exam  Physical Exam  Constitutional: She is oriented to person, place, and time and well-developed, well-nourished, and in no distress. No distress.  HENT:  Head: Normocephalic and atraumatic.  Eyes: Conjunctivae are normal.  Neck: Neck supple. No thyromegaly present.  Cardiovascular: Normal rate, regular rhythm and normal heart sounds.   No murmur heard. Pulmonary/Chest: Effort normal and breath sounds normal. She has no wheezes.  Abdominal: She exhibits no distension and no mass.  Musculoskeletal: She exhibits edema and tenderness.       Left knee diffuse swelling, most notably posterior. No joint instability or joint line tenderness  Lymphadenopathy:    She has no cervical adenopathy.  Neurological: She is alert and oriented to person, place, and time.  Skin: Skin is warm and dry. No rash noted. She is not diaphoretic.  Psychiatric: Memory and judgment normal.       Crying some    Lab Results  Component Value Date   TSH 0.80 04/18/2011   Lab Results  Component Value Date   WBC 5.0 04/18/2011   HGB 12.8 04/18/2011   HCT 39.2  04/18/2011   MCV 87.3 04/18/2011   PLT 182.0 04/18/2011   Lab Results  Component Value Date   CREATININE 0.7 07/11/2011   BUN 13 07/11/2011   NA 140 07/11/2011   K 3.8 07/11/2011   CL 108 07/11/2011   CO2 23 07/11/2011   Lab Results  Component Value Date   ALT 23 07/11/2011   AST 21 07/11/2011   ALKPHOS 108 07/11/2011   BILITOT 0.3 07/11/2011   Lab Results  Component Value Date   CHOL 196 04/18/2011   Lab Results  Component Value Date   HDL 62.80  04/18/2011   Lab Results  Component Value Date   LDLCALC 108* 04/18/2011   Lab Results  Component Value Date   TRIG 128.0 04/18/2011   Lab Results  Component Value Date   CHOLHDL 3 04/18/2011     Assessment & Plan  Elevated BP Adequately controlled today despite bad news and pain  Anxiety and depression Feels Celexa is working well enough but she is needing the Alprazolam to sleep and then uses it maybe 3-4 x a week when stress gets overwhelming. She is given a refill today  Knee pain, left Warm, swollen and painful without any obvious trauma. Xray is taken today and patient is encouraged to wrap it, referred to ortho for further intervention. Is out of her Meloxicam, she is asked to restart that and may continue Norco prn

## 2012-03-11 NOTE — Assessment & Plan Note (Signed)
Adequately controlled today despite bad news and pain

## 2012-03-11 NOTE — Assessment & Plan Note (Signed)
Feels Celexa is working well enough but she is needing the Alprazolam to sleep and then uses it maybe 3-4 x a week when stress gets overwhelming. She is given a refill today

## 2012-03-15 ENCOUNTER — Encounter: Payer: Self-pay | Admitting: Family Medicine

## 2012-04-21 ENCOUNTER — Encounter: Payer: Self-pay | Admitting: Family Medicine

## 2012-04-21 ENCOUNTER — Ambulatory Visit (INDEPENDENT_AMBULATORY_CARE_PROVIDER_SITE_OTHER): Payer: BC Managed Care – PPO | Admitting: Family Medicine

## 2012-04-21 ENCOUNTER — Ambulatory Visit: Payer: PRIVATE HEALTH INSURANCE | Admitting: Family Medicine

## 2012-04-21 ENCOUNTER — Other Ambulatory Visit: Payer: Self-pay | Admitting: Family Medicine

## 2012-04-21 VITALS — BP 152/94 | HR 104 | Temp 97.2°F | Ht 63.0 in | Wt 191.1 lb

## 2012-04-21 DIAGNOSIS — F418 Other specified anxiety disorders: Secondary | ICD-10-CM

## 2012-04-21 DIAGNOSIS — E119 Type 2 diabetes mellitus without complications: Secondary | ICD-10-CM

## 2012-04-21 DIAGNOSIS — F32A Depression, unspecified: Secondary | ICD-10-CM

## 2012-04-21 DIAGNOSIS — F341 Dysthymic disorder: Secondary | ICD-10-CM

## 2012-04-21 DIAGNOSIS — R110 Nausea: Secondary | ICD-10-CM

## 2012-04-21 DIAGNOSIS — F329 Major depressive disorder, single episode, unspecified: Secondary | ICD-10-CM

## 2012-04-21 DIAGNOSIS — IMO0001 Reserved for inherently not codable concepts without codable children: Secondary | ICD-10-CM

## 2012-04-21 DIAGNOSIS — J309 Allergic rhinitis, unspecified: Secondary | ICD-10-CM

## 2012-04-21 DIAGNOSIS — R030 Elevated blood-pressure reading, without diagnosis of hypertension: Secondary | ICD-10-CM

## 2012-04-21 DIAGNOSIS — R1013 Epigastric pain: Secondary | ICD-10-CM

## 2012-04-21 DIAGNOSIS — T7840XA Allergy, unspecified, initial encounter: Secondary | ICD-10-CM

## 2012-04-21 DIAGNOSIS — K219 Gastro-esophageal reflux disease without esophagitis: Secondary | ICD-10-CM

## 2012-04-21 MED ORDER — RANITIDINE HCL 300 MG PO TABS: 300.0000 mg | ORAL_TABLET | Freq: Every day | ORAL | Status: DC

## 2012-04-21 MED ORDER — FEXOFENADINE HCL 180 MG PO TABS: 180.0000 mg | ORAL_TABLET | Freq: Every day | ORAL | Status: DC | PRN

## 2012-04-21 MED ORDER — FLUTICASONE PROPIONATE 50 MCG/ACT NA SUSP: 2.0000 | Freq: Every day | NASAL | Status: DC

## 2012-04-21 MED ORDER — OMEPRAZOLE 40 MG PO CPDR: 40.0000 mg | DELAYED_RELEASE_CAPSULE | Freq: Every day | ORAL | Status: DC

## 2012-04-21 MED ORDER — ONDANSETRON 8 MG PO TBDP: 8.0000 mg | ORAL_TABLET | Freq: Three times a day (TID) | ORAL | Status: DC | PRN

## 2012-04-21 MED ORDER — METFORMIN HCL 500 MG PO TABS: 500.0000 mg | ORAL_TABLET | Freq: Every day | ORAL | Status: DC

## 2012-04-21 NOTE — Patient Instructions (Addendum)

## 2012-04-22 ENCOUNTER — Encounter: Payer: Self-pay | Admitting: Family Medicine

## 2012-04-22 DIAGNOSIS — IMO0001 Reserved for inherently not codable concepts without codable children: Secondary | ICD-10-CM

## 2012-04-22 HISTORY — DX: Reserved for inherently not codable concepts without codable children: IMO0001

## 2012-04-22 LAB — CBC
HCT: 39.7 % (ref 36.0–46.0)
Hemoglobin: 12.8 g/dL (ref 12.0–15.0)
RBC: 4.56 MIL/uL (ref 3.87–5.11)
RDW: 16.1 % — ABNORMAL HIGH (ref 11.5–15.5)
WBC: 5.6 10*3/uL (ref 4.0–10.5)

## 2012-04-22 LAB — BASIC METABOLIC PANEL
BUN: 12 mg/dL (ref 6–23)
CO2: 21 mEq/L (ref 19–32)
Chloride: 106 mEq/L (ref 96–112)
Glucose, Bld: 72 mg/dL (ref 70–99)
Potassium: 4.2 mEq/L (ref 3.5–5.3)

## 2012-04-22 LAB — PHOSPHORUS: Phosphorus: 2.5 mg/dL (ref 2.3–4.6)

## 2012-04-22 LAB — HEMOGLOBIN A1C
Hgb A1c MFr Bld: 6 % — ABNORMAL HIGH (ref <5.7)
Mean Plasma Glucose: 126 mg/dL — ABNORMAL HIGH (ref <117)

## 2012-04-22 LAB — HEPATIC FUNCTION PANEL
ALT: 18 U/L (ref 0–35)
AST: 13 U/L (ref 0–37)
Albumin: 4.2 g/dL (ref 3.5–5.2)
Alkaline Phosphatase: 88 U/L (ref 39–117)
Bilirubin, Direct: 0.1 mg/dL (ref 0.0–0.3)
Total Bilirubin: 0.3 mg/dL (ref 0.3–1.2)

## 2012-04-22 LAB — LIPID PANEL
Triglycerides: 131 mg/dL (ref <150)
VLDL: 26 mg/dL (ref 0–40)

## 2012-04-22 MED ORDER — CITALOPRAM HYDROBROMIDE 20 MG PO TABS: 30.0000 mg | ORAL_TABLET | Freq: Every day | ORAL | Status: DC

## 2012-04-22 NOTE — Assessment & Plan Note (Signed)
h Pylori negative, start Ranitidine 300 mg po qhs and avoid offending foods, start a probiotic, Ondansetron prn

## 2012-04-22 NOTE — Assessment & Plan Note (Signed)
Improved with recheck, if still elevated at next visit, may need medicaitons

## 2012-04-22 NOTE — Assessment & Plan Note (Signed)
Allegra working better than Zyrtec may try bid and nasal saline as well

## 2012-04-22 NOTE — Assessment & Plan Note (Signed)
Increase Celexa to 30 mg daily and reassess at next visit

## 2012-04-22 NOTE — Assessment & Plan Note (Signed)
Has not been checking her numbers, is going to contact her glucometer company about the cost and will contact us if we need to change her glucometer for cost reasons

## 2012-04-22 NOTE — Progress Notes (Signed)
Patient ID: Nicole Ball, female   DOB: [birth year], 36 y.o.   MRN: [id number] Nicole Ball [id number] [birth year] 04/22/2012      Progress Note-Follow Up  Subjective  Chief Complaint  Chief Complaint  Patient presents with  . possible gastritis  . Sinusitis    HPI  Patient is a 36 year old American female who is in today with a roughly three-week history of GI upset. She notes some increased bloating and dyspepsia. Increased belching and epigastric and left upper quadrant pain. She's had some intermittent loose stool. She's been taking Pepto-Bismol and noticed that her stool are now darker and instead of moving daily are moving every other day and are slightly harder. She has some nausea but no significant vomiting. Notes some fatigue. Is having a lot of stress and is requesting that her Celexa be increased as we previously talked about. Since a lot of sinus pressure and postnasal drip. No fevers or chills. Does have frequent daily headaches. No palpitations or shortness of breath the  Past Medical History  Diagnosis Date  . Allergy     seasonal  . Acute low back pain 12/19/2010  . Overweight 10/16/2010  . SNORING 10/16/2010  . UTI'S, HX OF 10/16/2010  . INSOMNIA 10/16/2010  . HIATAL HERNIA WITH REFLUX, HX OF 10/16/2010  . ALLERGIC RHINITIS 10/16/2010  . Hyperlipidemia 04/21/2011  . Elevated BP 04/21/2011  . Sleep apnea 06/07/2011  . Anxiety and depression 06/07/2011  . Amenorrhea 07/11/2011  . Diabetes mellitus 10/30/2011  . Knee pain, left 03/11/2012  . Reflux 04/22/2012    History reviewed. No pertinent past surgical history.  Family History  Problem Relation Age of Onset  . Lupus Mother   . Arthritis Maternal Grandmother   . Scleroderma Maternal Grandmother   . Glaucoma Maternal Grandmother   . Cataracts Maternal Grandmother   . Cancer Maternal Grandfather     prostate  . Coronary artery disease Maternal Grandfather     s/p angioplasty  . Diabetes Maternal Grandfather    Type 2  . Cancer Paternal Grandmother     Breast ca- dx in 40's  . Thyroid disease Paternal Grandmother   . Diabetes Paternal Grandfather   . Thyroid disease Sister   . Asthma Sister   . Allergies Brother   . Arthritis Other     History   Social History  . Marital Status: Married    Spouse Name: N/A    Number of Children: N/A  . Years of Education: N/A   Occupational History  . Not on file.   Social History Main Topics  . Smoking status: Current Some Day Smoker -- 0.1 packs/day    Types: Cigars  . Smokeless tobacco: Never Used   Comment: 1 black and milds  . Alcohol Use: Yes     occasional  . Drug Use: No  . Sexually Active: Yes -- Female partner(s)   Other Topics Concern  . Not on file   Social History Narrative  . No narrative on file    Current Outpatient Prescriptions on File Prior to Visit  Medication Sig Dispense Refill  . ALPRAZolam (XANAX) 0.5 MG tablet Take 1 tablet (0.5 mg total) by mouth 3 (three) times daily as needed for sleep or anxiety.  90 tablet  2  . citalopram (CELEXA) 20 MG tablet Take 1 tablet (20 mg total) by mouth daily.  30 tablet  2  . cyclobenzaprine (FLEXERIL) 10 MG tablet Take 1 tablet (10 mg total) by mouth 2 (  two) times daily as needed for muscle spasms.  60 tablet  5  . glucose blood (FREESTYLE LITE) test strip Use as instructed, test daily and once daily prn symptoms  100 each  5  . HYDROcodone-acetaminophen (NORCO/VICODIN) 5-325 MG per tablet Take 1 tablet by mouth every 6 (six) hours as needed. 1/2 to 1 tab po bid prn for severe pain  100 tablet  2  . Lancets (FREESTYLE) lancets Use as instructed. Test daily and once daily as needed  100 each  5  . meloxicam (MOBIC) 15 MG tablet Take 1 tablet (15 mg total) by mouth daily.  30 tablet  5  . Norethindrone Acetate-Ethinyl Estradiol (LOESTRIN 1.5/30, 21,) 1.5-30 MG-MCG tablet Take 1 tablet by mouth daily.      Marland Kitchen omeprazole (PRILOSEC) 20 MG capsule Take 1 capsule (20 mg total) by mouth daily.   30 capsule  5  . Saline 0.65 % (SOLN) SOLN Place 2 Squirts into the nose 2 (two) times daily.  1 Bottle  0  . zolpidem (AMBIEN) 10 MG tablet Take 1 tablet (10 mg total) by mouth at bedtime as needed.  30 tablet  2  . fexofenadine (ALLEGRA) 180 MG tablet Take 1 tablet (180 mg total) by mouth daily as needed.  30 tablet  5  . fluticasone (FLONASE) 50 MCG/ACT nasal spray Place 2 sprays into the nose daily.  16 g  6  . metFORMIN (GLUCOPHAGE) 500 MG tablet Take 1 tablet (500 mg total) by mouth at bedtime.  30 tablet  3  . ranitidine (ZANTAC) 300 MG tablet Take 1 tablet (300 mg total) by mouth at bedtime.  30 tablet  3    No Known Allergies  Review of Systems  Review of Systems  Constitutional: Positive for malaise/fatigue. Negative for fever.  HENT: Positive for congestion.   Eyes: Negative for discharge.  Respiratory: Positive for sputum production. Negative for cough and shortness of breath.   Cardiovascular: Negative for chest pain, palpitations and leg swelling.  Gastrointestinal: Positive for nausea, vomiting, abdominal pain and diarrhea. Negative for constipation, blood in stool and melena.  Genitourinary: Negative for dysuria.  Musculoskeletal: Negative for falls.  Skin: Negative for rash.  Neurological: Negative for loss of consciousness and headaches.  Endo/Heme/Allergies: Negative for polydipsia.  Psychiatric/Behavioral: Negative for depression and suicidal ideas. The patient is not nervous/anxious and does not have insomnia.     Objective  BP 152/94  Pulse 104  Temp 97.2 F (36.2 C) (Temporal)  Ht 5\' 3"  (1.6 m)  Wt 191 lb 1.9 oz (86.691 kg)  BMI 33.86 kg/m2  SpO2 98%  LMP 02/19/2012  Physical Exam  Physical Exam  Constitutional: She is oriented to person, place, and time and well-developed, well-nourished, and in no distress. No distress.  HENT:  Head: Normocephalic and atraumatic.  Eyes: Conjunctivae normal are normal.  Neck: Neck supple. No thyromegaly present.   Cardiovascular: Normal rate, regular rhythm and normal heart sounds.   No murmur heard. Pulmonary/Chest: Effort normal and breath sounds normal. She has no wheezes.  Abdominal: She exhibits no distension and no mass.  Musculoskeletal: She exhibits no edema.  Lymphadenopathy:    She has no cervical adenopathy.  Neurological: She is alert and oriented to person, place, and time.  Skin: Skin is warm and dry. No rash noted. She is not diaphoretic.  Psychiatric: Memory, affect and judgment normal.    Lab Results  Component Value Date   TSH 0.433 04/21/2012   Lab Results  Component Value Date   WBC 5.6 04/21/2012   HGB 12.8 04/21/2012   HCT 39.7 04/21/2012   MCV 87.1 04/21/2012   PLT 232 04/21/2012   Lab Results  Component Value Date   CREATININE 0.73 04/21/2012   BUN 12 04/21/2012   NA 137 04/21/2012   K 4.2 04/21/2012   CL 106 04/21/2012   CO2 21 04/21/2012   Lab Results  Component Value Date   ALT 18 04/21/2012   AST 13 04/21/2012   ALKPHOS 88 04/21/2012   BILITOT 0.3 04/21/2012   Lab Results  Component Value Date   CHOL 165 04/21/2012   Lab Results  Component Value Date   HDL 63 04/21/2012   Lab Results  Component Value Date   LDLCALC 76 04/21/2012   Lab Results  Component Value Date   TRIG 131 04/21/2012   Lab Results  Component Value Date   CHOLHDL 2.6 04/21/2012     Assessment & Plan  Elevated BP Improved with recheck, if still elevated at next visit, may need medicaitons  Reflux h Pylori negative, start Ranitidine 300 mg po qhs and avoid offending foods, start a probiotic, Ondansetron prn  Diabetes mellitus Has not been checking her numbers, is going to contact her glucometer company about the cost and will contact us if we need to change her glucometer for cost reasons  ALLERGIC RHINITIS Allegra working better than Zyrtec may try bid and nasal saline as well  Anxiety and depression Increase Celexa to 30 mg daily and reassess at next visit

## 2012-04-23 ENCOUNTER — Encounter: Payer: Self-pay | Admitting: Family Medicine

## 2012-04-23 NOTE — Progress Notes (Signed)
Patient is aware of results.

## 2012-05-20 ENCOUNTER — Other Ambulatory Visit: Payer: Self-pay

## 2012-05-20 MED ORDER — BAYER CONTOUR MONITOR DEVI: Status: DC

## 2012-05-20 MED ORDER — GLUCOSE BLOOD VI STRP: ORAL_STRIP | Status: DC

## 2012-05-20 MED ORDER — BAYER MICROLET LANCETS MISC: Status: DC

## 2012-05-20 NOTE — Telephone Encounter (Signed)
We received a drug change request for pts diabetic meter, strips, and lancets. Plan will pay for [facility].  I sent new rx

## 2012-05-27 ENCOUNTER — Other Ambulatory Visit: Payer: Self-pay

## 2012-05-27 DIAGNOSIS — F32A Depression, unspecified: Secondary | ICD-10-CM

## 2012-05-27 DIAGNOSIS — F329 Major depressive disorder, single episode, unspecified: Secondary | ICD-10-CM

## 2012-05-27 MED ORDER — CITALOPRAM HYDROBROMIDE 20 MG PO TABS: 30.0000 mg | ORAL_TABLET | Freq: Every day | ORAL | Status: DC

## 2012-06-02 ENCOUNTER — Other Ambulatory Visit: Payer: Self-pay

## 2012-06-02 DIAGNOSIS — E119 Type 2 diabetes mellitus without complications: Secondary | ICD-10-CM

## 2012-06-02 MED ORDER — METFORMIN HCL 500 MG PO TABS: 500.0000 mg | ORAL_TABLET | Freq: Every day | ORAL | Status: DC

## 2012-07-02 ENCOUNTER — Other Ambulatory Visit: Payer: Self-pay | Admitting: Family Medicine

## 2012-07-02 DIAGNOSIS — M549 Dorsalgia, unspecified: Secondary | ICD-10-CM

## 2012-07-02 DIAGNOSIS — F32A Depression, unspecified: Secondary | ICD-10-CM

## 2012-07-02 DIAGNOSIS — F419 Anxiety disorder, unspecified: Secondary | ICD-10-CM

## 2012-07-02 MED ORDER — HYDROCODONE-ACETAMINOPHEN 5-325 MG PO TABS: 1.0000 | ORAL_TABLET | Freq: Four times a day (QID) | ORAL | Status: DC | PRN

## 2012-07-02 MED ORDER — ALPRAZOLAM 0.5 MG PO TABS: 0.5000 mg | ORAL_TABLET | Freq: Three times a day (TID) | ORAL | Status: DC | PRN

## 2012-07-02 NOTE — Telephone Encounter (Signed)
Refill request for ALPRAZOLAM Last filled- 03/10/12, #90 X 2  Refill request for HYDROCODONE Last filled- 03/10/12, #100 X 2  Last seen- 04/21/12 Follow up - IN 4 WEEKS Has appt on 07/05/12 for rash. Please advise refill.

## 2012-07-02 NOTE — Telephone Encounter (Signed)
RX sent

## 2012-07-05 ENCOUNTER — Ambulatory Visit: Payer: BC Managed Care – PPO | Admitting: Family Medicine

## 2012-07-09 ENCOUNTER — Other Ambulatory Visit: Payer: Self-pay | Admitting: Family Medicine

## 2012-07-09 NOTE — Telephone Encounter (Signed)
Patient would like to get an Rx for antibiotics for sinus infection & vaginal yeast infection since she always gets one when she takes antibiotics. Can she get this today?

## 2012-07-09 NOTE — Telephone Encounter (Signed)
Message left for patient to return call.

## 2012-07-09 NOTE — Telephone Encounter (Signed)
Pt states she was told at last visit to call if OTC did not work for symptoms.  Advised pt that visit was in early September and policy is no antibiotics over the phone.  She would need to be seen first.  She declined appt at [facility].  She will treat symptoms and call office on Monday for an appt.  Pt states she may not be able to come in until later in the week.

## 2012-07-16 ENCOUNTER — Other Ambulatory Visit: Payer: Self-pay

## 2012-07-16 DIAGNOSIS — G4700 Insomnia, unspecified: Secondary | ICD-10-CM

## 2012-07-16 MED ORDER — ZOLPIDEM TARTRATE 10 MG PO TABS: 10.0000 mg | ORAL_TABLET | Freq: Every evening | ORAL | Status: DC | PRN

## 2012-07-16 NOTE — Telephone Encounter (Signed)
Please advise Ambien refill? Last RX was wrote on 03-10-12 quantity 30 with 2 refills.  If ok to fax send to [phone]

## 2012-08-06 ENCOUNTER — Ambulatory Visit (INDEPENDENT_AMBULATORY_CARE_PROVIDER_SITE_OTHER): Payer: BC Managed Care – PPO | Admitting: Family Medicine

## 2012-08-06 ENCOUNTER — Encounter: Payer: Self-pay | Admitting: Family Medicine

## 2012-08-06 ENCOUNTER — Ambulatory Visit: Payer: BC Managed Care – PPO | Admitting: Family Medicine

## 2012-08-06 VITALS — BP 124/86 | HR 87 | Temp 98.2°F | Ht 63.0 in | Wt 191.8 lb

## 2012-08-06 DIAGNOSIS — M545 Low back pain, unspecified: Secondary | ICD-10-CM

## 2012-08-06 DIAGNOSIS — F419 Anxiety disorder, unspecified: Secondary | ICD-10-CM

## 2012-08-06 DIAGNOSIS — F32A Depression, unspecified: Secondary | ICD-10-CM

## 2012-08-06 DIAGNOSIS — J019 Acute sinusitis, unspecified: Secondary | ICD-10-CM

## 2012-08-06 DIAGNOSIS — T7840XA Allergy, unspecified, initial encounter: Secondary | ICD-10-CM

## 2012-08-06 DIAGNOSIS — M25562 Pain in left knee: Secondary | ICD-10-CM

## 2012-08-06 DIAGNOSIS — M25569 Pain in unspecified knee: Secondary | ICD-10-CM

## 2012-08-06 DIAGNOSIS — F341 Dysthymic disorder: Secondary | ICD-10-CM

## 2012-08-06 MED ORDER — FLUTICASONE PROPIONATE 50 MCG/ACT NA SUSP: 2.0000 | Freq: Every day | NASAL | Status: DC

## 2012-08-06 MED ORDER — HYDROCOD POLST-CHLORPHEN POLST 10-8 MG/5ML PO LQCR: 5.0000 mL | Freq: Every evening | ORAL | Status: DC | PRN

## 2012-08-06 MED ORDER — GUAIFENESIN ER 600 MG PO TB12: 600.0000 mg | ORAL_TABLET | Freq: Two times a day (BID) | ORAL | Status: DC

## 2012-08-06 MED ORDER — AMOXICILLIN-POT CLAVULANATE 875-125 MG PO TABS: 1.0000 | ORAL_TABLET | Freq: Two times a day (BID) | ORAL | Status: DC

## 2012-08-06 NOTE — Assessment & Plan Note (Signed)
Improved pain and swelling at this time

## 2012-08-06 NOTE — Assessment & Plan Note (Signed)
Continues to have low back pain intermittently uses meloxicam and uses Hydrocodone prn with good results. Encouraged increased rest and stretching.

## 2012-08-06 NOTE — Assessment & Plan Note (Signed)
Started on Augmentin 875 mg po bid, Mucinex, Tussionex, increase rest and lfuids, add a probiotics

## 2012-08-06 NOTE — Patient Instructions (Addendum)

## 2012-08-06 NOTE — Assessment & Plan Note (Signed)
Has had some increased

## 2012-08-11 NOTE — Progress Notes (Signed)
Patient ID: ALEASE Ball, female   DOB: 1976-01-27, 37 y.o.   MRN: [id number] Nicole Ball [id number] [birth year] 08/11/2012      Progress Note-Follow Up  Subjective  Chief Complaint  Chief Complaint  Patient presents with  . Cough    X 1 week- w/ phlegm (clear)  . Fever    X 1 week  . Headache    X 1 week    HPI  Patient is a 37 year old demented female who is in today for one week's worth of congestion. She has had congestion and now chest congestion low-grade fevers chills and malaise and myalgias and generalized and the cough is occasionally productive of clear and her chest pain, palpitations, shortness of breath, GI or GU complaints otherwise noted. Has not taken any significant over-the-counter medication  Past Medical History  Diagnosis Date  . Allergy     seasonal  . Acute low back pain 12/19/2010  . Overweight 10/16/2010  . SNORING 10/16/2010  . UTI'S, HX OF 10/16/2010  . INSOMNIA 10/16/2010  . HIATAL HERNIA WITH REFLUX, HX OF 10/16/2010  . ALLERGIC RHINITIS 10/16/2010  . Hyperlipidemia 04/21/2011  . Elevated BP 04/21/2011  . Sleep apnea 06/07/2011  . Anxiety and depression 06/07/2011  . Amenorrhea 07/11/2011  . Diabetes mellitus 10/30/2011  . Knee pain, left 03/11/2012  . Reflux 04/22/2012  . Acute sinusitis 08/06/2012    History reviewed. No pertinent past surgical history.  Family History  Problem Relation Age of Onset  . Lupus Mother   . Arthritis Maternal Grandmother   . Scleroderma Maternal Grandmother   . Glaucoma Maternal Grandmother   . Cataracts Maternal Grandmother   . Cancer Maternal Grandfather     prostate  . Coronary artery disease Maternal Grandfather     s/p angioplasty  . Diabetes Maternal Grandfather     Type 2  . Cancer Paternal Grandmother     Breast ca- dx in 87's  . Thyroid disease Paternal Grandmother   . Diabetes Paternal Grandfather   . Thyroid disease Sister   . Asthma Sister   . Allergies Brother   . Arthritis Other      History   Social History  . Marital Status: Married    Spouse Name: N/A    Number of Children: N/A  . Years of Education: N/A   Occupational History  . Not on file.   Social History Main Topics  . Smoking status: Current Some Day Smoker -- 0.1 packs/day    Types: Cigars  . Smokeless tobacco: Never Used     Comment: 1 black and milds  . Alcohol Use: Yes     Comment: occasional  . Drug Use: No  . Sexually Active: Yes -- Female partner(s)   Other Topics Concern  . Not on file   Social History Narrative  . No narrative on file    Current Outpatient Prescriptions on File Prior to Visit  Medication Sig Dispense Refill  . ALPRAZolam (XANAX) 0.5 MG tablet Take 1 tablet (0.5 mg total) by mouth 3 (three) times daily as needed for sleep or anxiety.  90 tablet  2  . BAYER MICROLET LANCETS lancets Use as directed to test daily and as needed for symptoms  100 each  0  . Blood Glucose Monitoring Suppl (CONTOUR BLOOD GLUCOSE SYSTEM) DEVI Use as directed to test daily and as needed for symptoms  1 Device  0  . citalopram (CELEXA) 20 MG tablet Take 1.5 tablets (30 mg total) by  mouth daily.  45 tablet  5  . cyclobenzaprine (FLEXERIL) 10 MG tablet Take 1 tablet (10 mg total) by mouth 2 (two) times daily as needed for muscle spasms.  60 tablet  5  . fexofenadine (ALLEGRA) 180 MG tablet Take 1 tablet (180 mg total) by mouth daily as needed.  30 tablet  5  . glucose blood (BAYER CONTOUR TEST) test strip Use as directed to test daily and as needed for symptoms  100 each  0  . HYDROcodone-acetaminophen (NORCO/VICODIN) 5-325 MG per tablet Take 1 tablet by mouth every 6 (six) hours as needed. 1/2 to 1 tab po bid prn for severe pain  100 tablet  2  . meloxicam (MOBIC) 15 MG tablet Take 1 tablet (15 mg total) by mouth daily.  30 tablet  5  . metFORMIN (GLUCOPHAGE) 500 MG tablet Take 1 tablet (500 mg total) by mouth at bedtime.  30 tablet  3  . Norethindrone Acetate-Ethinyl Estradiol (LOESTRIN 1.5/30,  21,) 1.5-30 MG-MCG tablet Take 1 tablet by mouth daily.      Marland Kitchen omeprazole (PRILOSEC) 40 MG capsule Take 1 capsule (40 mg total) by mouth daily.  30 capsule  3  . ondansetron (ZOFRAN-ODT) 8 MG disintegrating tablet Take 1 tablet (8 mg total) by mouth every 8 (eight) hours as needed for nausea.  60 tablet  1  . ranitidine (ZANTAC) 300 MG tablet Take 1 tablet (300 mg total) by mouth at bedtime.  30 tablet  3  . Saline 0.65 % (SOLN) SOLN Place 2 Squirts into the nose 2 (two) times daily.  1 Bottle  0  . zolpidem (AMBIEN) 10 MG tablet Take 1 tablet (10 mg total) by mouth at bedtime as needed.  30 tablet  2  . AZURETTE 0.15-0.02/0.01 MG (21/5) tablet         No Known Allergies  Review of Systems  Review of Systems  Constitutional: Positive for fever and malaise/fatigue.  HENT: Positive for congestion.   Eyes: Negative for discharge.  Respiratory: Positive for cough and sputum production. Negative for shortness of breath.   Cardiovascular: Negative for chest pain, palpitations and leg swelling.  Gastrointestinal: Negative for nausea, abdominal pain and diarrhea.  Genitourinary: Negative for dysuria.  Musculoskeletal: Negative for falls.  Skin: Negative for rash.  Neurological: Positive for headaches. Negative for loss of consciousness.  Endo/Heme/Allergies: Negative for polydipsia.  Psychiatric/Behavioral: Negative for depression and suicidal ideas. The patient is not nervous/anxious and does not have insomnia.     Objective  BP 124/86  Pulse 87  Temp 98.2 F (36.8 C) (Temporal)  Ht 5\' 3"  (1.6 m)  Wt 191 lb 12.8 oz (87 kg)  BMI 33.98 kg/m2  SpO2 100%  LMP 08/04/2012  Physical Exam  Physical Exam  Constitutional: She is oriented to person, place, and time and well-developed, well-nourished, and in no distress. No distress.  HENT:  Head: Normocephalic and atraumatic.       TMs dull and mildly erythematous  Eyes: Conjunctivae normal are normal.  Neck: Neck supple. No thyromegaly  present.  Cardiovascular: Normal rate, regular rhythm and normal heart sounds.   No murmur heard. Pulmonary/Chest: Effort normal and breath sounds normal. She has no wheezes.  Abdominal: She exhibits no distension and no mass.  Musculoskeletal: She exhibits no edema.  Lymphadenopathy:    She has no cervical adenopathy.  Neurological: She is alert and oriented to person, place, and time.  Skin: Skin is warm and dry. No rash noted. She is  not diaphoretic.  Psychiatric: Memory, affect and judgment normal.    Lab Results  Component Value Date   TSH 0.433 04/21/2012   Lab Results  Component Value Date   WBC 5.6 04/21/2012   HGB 12.8 04/21/2012   HCT 39.7 04/21/2012   MCV 87.1 04/21/2012   PLT 232 04/21/2012   Lab Results  Component Value Date   CREATININE 0.73 04/21/2012   BUN 12 04/21/2012   NA 137 04/21/2012   K 4.2 04/21/2012   CL 106 04/21/2012   CO2 21 04/21/2012   Lab Results  Component Value Date   ALT 18 04/21/2012   AST 13 04/21/2012   ALKPHOS 88 04/21/2012   BILITOT 0.3 04/21/2012   Lab Results  Component Value Date   CHOL 165 04/21/2012   Lab Results  Component Value Date   HDL 63 04/21/2012   Lab Results  Component Value Date   LDLCALC 76 04/21/2012   Lab Results  Component Value Date   TRIG 131 04/21/2012   Lab Results  Component Value Date   CHOLHDL 2.6 04/21/2012     Assessment & Plan  Knee pain, left Improved pain and swelling at this time  Acute low back pain Continues to have low back pain intermittently uses meloxicam and uses Hydrocodone prn with good results. Encouraged increased rest and stretching.  Acute sinusitis Started on Augmentin 875 mg po bid, Mucinex, Tussionex, increase rest and lfuids, add a probiotics  Anxiety and depression Has had some increased

## 2012-09-29 ENCOUNTER — Other Ambulatory Visit: Payer: Self-pay

## 2012-09-29 DIAGNOSIS — E119 Type 2 diabetes mellitus without complications: Secondary | ICD-10-CM

## 2012-09-29 MED ORDER — MELOXICAM 15 MG PO TABS: 15.0000 mg | ORAL_TABLET | Freq: Every day | ORAL | Status: DC

## 2012-09-29 MED ORDER — METFORMIN HCL 500 MG PO TABS: 500.0000 mg | ORAL_TABLET | Freq: Every day | ORAL | Status: DC

## 2012-10-18 ENCOUNTER — Other Ambulatory Visit: Payer: Self-pay

## 2012-10-18 DIAGNOSIS — G4700 Insomnia, unspecified: Secondary | ICD-10-CM

## 2012-10-18 DIAGNOSIS — IMO0001 Reserved for inherently not codable concepts without codable children: Secondary | ICD-10-CM

## 2012-10-18 DIAGNOSIS — E119 Type 2 diabetes mellitus without complications: Secondary | ICD-10-CM

## 2012-10-18 MED ORDER — ZOLPIDEM TARTRATE 10 MG PO TABS: 10.0000 mg | ORAL_TABLET | Freq: Every evening | ORAL | Status: DC | PRN

## 2012-10-18 MED ORDER — METFORMIN HCL 500 MG PO TABS: 500.0000 mg | ORAL_TABLET | Freq: Every day | ORAL | Status: DC

## 2012-10-18 MED ORDER — RANITIDINE HCL 300 MG PO TABS: 300.0000 mg | ORAL_TABLET | Freq: Every day | ORAL | Status: DC

## 2012-10-18 NOTE — Telephone Encounter (Signed)
Please advise Ambien refill? Last RX was wrote on 07-16-12 quantity 30 with 2 refills?  If ok fax to [phone]

## 2012-10-21 ENCOUNTER — Other Ambulatory Visit: Payer: Self-pay

## 2012-10-21 DIAGNOSIS — F419 Anxiety disorder, unspecified: Secondary | ICD-10-CM

## 2012-10-21 DIAGNOSIS — F32A Depression, unspecified: Secondary | ICD-10-CM

## 2012-10-21 MED ORDER — ALPRAZOLAM 0.5 MG PO TABS: 0.5000 mg | ORAL_TABLET | Freq: Three times a day (TID) | ORAL | Status: DC | PRN

## 2012-10-21 NOTE — Telephone Encounter (Signed)
Please advise Alprazolam refill? Last RX faxed 07-02-12 quantity 90 with 2 refills  If ok fax to [phone]

## 2012-10-21 NOTE — Telephone Encounter (Signed)
Faxed to pharmacy

## 2012-10-27 ENCOUNTER — Other Ambulatory Visit: Payer: BC Managed Care – PPO

## 2012-11-03 ENCOUNTER — Ambulatory Visit: Payer: BC Managed Care – PPO | Admitting: Family Medicine

## 2012-11-18 ENCOUNTER — Other Ambulatory Visit: Payer: Self-pay

## 2012-11-18 DIAGNOSIS — M549 Dorsalgia, unspecified: Secondary | ICD-10-CM

## 2012-11-18 MED ORDER — HYDROCODONE-ACETAMINOPHEN 5-325 MG PO TABS: 1.0000 | ORAL_TABLET | Freq: Four times a day (QID) | ORAL | Status: DC | PRN

## 2012-11-18 NOTE — Telephone Encounter (Signed)
Per md ok to send RX to pharmacy

## 2012-12-29 ENCOUNTER — Telehealth: Payer: Self-pay | Admitting: Family Medicine

## 2012-12-29 DIAGNOSIS — F419 Anxiety disorder, unspecified: Secondary | ICD-10-CM

## 2012-12-29 DIAGNOSIS — F32A Depression, unspecified: Secondary | ICD-10-CM

## 2012-12-29 MED ORDER — CITALOPRAM HYDROBROMIDE 20 MG PO TABS: 30.0000 mg | ORAL_TABLET | Freq: Every day | ORAL | Status: DC

## 2012-12-29 NOTE — Telephone Encounter (Signed)
OPENED IN ERROR

## 2012-12-29 NOTE — Telephone Encounter (Signed)
Refill- citalopram 20mg  tablets. TK 1 TS PO D. QTY 45 LAST FILL 5.12.14

## 2013-01-28 ENCOUNTER — Ambulatory Visit: Payer: BC Managed Care – PPO | Admitting: Family Medicine

## 2013-01-28 ENCOUNTER — Telehealth: Payer: Self-pay | Admitting: Family Medicine

## 2013-01-28 MED ORDER — SERTRALINE HCL 50 MG PO TABS: 50.0000 mg | ORAL_TABLET | Freq: Every day | ORAL | Status: DC

## 2013-01-28 NOTE — Telephone Encounter (Signed)
Pt was in with spouse today and verbal per MD send in Sertraline 50 mg take 1/2 tab po daily X 7 days then 1 tab daily #30 with 1 refill

## 2013-01-28 NOTE — Telephone Encounter (Signed)
Patient called in requesting a callback from [place]. She has questions regarding citalopram.

## 2013-01-28 NOTE — Telephone Encounter (Signed)
Left a message for pt to return my call. 

## 2013-01-28 NOTE — Telephone Encounter (Signed)
Patient returned phone call. °

## 2013-01-28 NOTE — Telephone Encounter (Signed)
Pt reports that she has previously discussed Celexa with you and although it is therapeutic, pt feels that excessive sweating is d/t medication and also contributing to her Insomnia and would like to change medication; she reports that she will be coming in with her husband, Marcial Pacas, today at 2:00p/SLS

## 2013-02-10 ENCOUNTER — Other Ambulatory Visit: Payer: Self-pay | Admitting: Family Medicine

## 2013-02-10 NOTE — Telephone Encounter (Signed)
Rx faxed to pharmacy/SLS 

## 2013-02-10 NOTE — Telephone Encounter (Signed)
Alprazolam request Last Rx: 03.13.14, #90x2 Zolpidem request Last Rx: 03.10.14, #30x2 Last OV: 12.27.13 Please advise on refills/SLS

## 2013-02-11 ENCOUNTER — Other Ambulatory Visit: Payer: Self-pay | Admitting: Family Medicine

## 2013-02-14 NOTE — Telephone Encounter (Signed)
Please advise refill? Last RX was done on 11-18-12 quantity 100 with 2 refills  If ok fax to [phone]

## 2013-02-18 ENCOUNTER — Telehealth: Payer: Self-pay | Admitting: Family Medicine

## 2013-02-18 NOTE — Telephone Encounter (Signed)
Refill- metformin 500mg  tablets. Take one tablet by mouth every night at bedtime. Qty 30 last fill 6.11.14

## 2013-02-18 NOTE — Telephone Encounter (Signed)
Refills last sent 10/18/12 #30 x 5 refills. Should still have refills at pharmacy. Left detailed message on pharmacy voicemail to check refills on file and call if any questions.

## 2013-02-21 ENCOUNTER — Telehealth: Payer: Self-pay | Admitting: Family Medicine

## 2013-02-21 NOTE — Telephone Encounter (Signed)
I left a message for pt to return my call.  Dr Abner Greenspan please advise?

## 2013-02-21 NOTE — Telephone Encounter (Signed)
Patient Information:  Caller Name: Aryn  Phone: [phone]  Patient: Starling, Jessie  Gender: Female  DOB: 1975/11/12  Age: 37 Years  PCP: Danise Edge [facility])  Pregnant: No  Office Follow Up:  Does the office need to follow up with this patient?: Yes  Instructions For The Office: Requesting callback from Dr. Mariel Aloe nurse Lorene Dy- Mccartney thinks she is having side effects from Zoloft.   Symptoms  Reason For Call & Symptoms: Loreley states she has  increased anxiety on 02/21/13.  States she switched meds from Citalopram to Zoloft about 3 weeks ago. Thinks she began having side effects of Zoloft on approx 02/07/13.  Feels more anxious, having more panic attacks. States she has less sweating but "stays hot." Was very shaky at 1500. Blood glucose was 173. Per depression protocol has discuss with PCP and callback by nurse  today. Requesting a callback by Dr. Mariel Aloe nurse Lorene Dy at [phone]  Reviewed Health History In EMR: Yes  Reviewed Medications In EMR: Yes  Reviewed Allergies In EMR: Yes  Reviewed Surgeries / Procedures: Yes  Date of Onset of Symptoms: 02/07/2013  Treatments Tried: Xanax  Treatments Tried Worked: No OB / GYN:  LMP: Unknown  Guideline(s) Used:  Depression  Disposition Per Guideline:   Discuss with PCP and Callback by Nurse Today  Reason For Disposition Reached:   Started on anti-depressant medications > 2 weeks ago and not feeling any better  Advice Given:  Reassurance:   Encourage the caller to talk about his/her problems and feelings.  Offer hope.  Call Back If:  Sadness or depression symptoms persist more than 2 weeks  You become worse.  Patient Will Follow Care Advice:  YES

## 2013-02-21 NOTE — Telephone Encounter (Signed)
Try switching her to escitalopram 10 mg daily and stop the Sertraline 50 mg daily, disp #30 2 rf, hve her come in for visit in about 2 weeks

## 2013-02-22 NOTE — Telephone Encounter (Signed)
OK to continue Sertralin 75 mg po daily til seen later this month

## 2013-02-22 NOTE — Telephone Encounter (Signed)
FYI: I spoke to pt and she states she took an extra 1/2 tab of Zoloft yesterday and felt better and did the same today and feels ok so far. Pt would like to continue taking 75 mg for now and scheduled an appt for 7-28.

## 2013-03-01 ENCOUNTER — Other Ambulatory Visit: Payer: Self-pay

## 2013-03-01 MED ORDER — SERTRALINE HCL 50 MG PO TABS: 75.0000 mg | ORAL_TABLET | Freq: Every day | ORAL | Status: DC

## 2013-03-01 NOTE — Telephone Encounter (Signed)
OK to increase Zoloft to 75 mg daily (50 mg tab, 1 1/2 tab daily) disp #45 to 3 rf

## 2013-03-01 NOTE — Telephone Encounter (Signed)
Pt states she needs a refill on her Zoloft because she has been taking 75 mg. Pt stated that this is working good. Please advise?

## 2013-03-07 ENCOUNTER — Encounter: Payer: Self-pay | Admitting: Family Medicine

## 2013-03-07 ENCOUNTER — Ambulatory Visit (INDEPENDENT_AMBULATORY_CARE_PROVIDER_SITE_OTHER): Payer: BC Managed Care – PPO | Admitting: Family Medicine

## 2013-03-07 VITALS — BP 124/84 | HR 79 | Temp 98.1°F | Ht 63.0 in | Wt 195.0 lb

## 2013-03-07 DIAGNOSIS — E119 Type 2 diabetes mellitus without complications: Secondary | ICD-10-CM

## 2013-03-07 DIAGNOSIS — IMO0001 Reserved for inherently not codable concepts without codable children: Secondary | ICD-10-CM

## 2013-03-07 DIAGNOSIS — F329 Major depressive disorder, single episode, unspecified: Secondary | ICD-10-CM

## 2013-03-07 DIAGNOSIS — K219 Gastro-esophageal reflux disease without esophagitis: Secondary | ICD-10-CM

## 2013-03-07 DIAGNOSIS — F341 Dysthymic disorder: Secondary | ICD-10-CM

## 2013-03-07 DIAGNOSIS — E785 Hyperlipidemia, unspecified: Secondary | ICD-10-CM

## 2013-03-07 DIAGNOSIS — R030 Elevated blood-pressure reading, without diagnosis of hypertension: Secondary | ICD-10-CM

## 2013-03-07 DIAGNOSIS — F418 Other specified anxiety disorders: Secondary | ICD-10-CM

## 2013-03-07 DIAGNOSIS — G43909 Migraine, unspecified, not intractable, without status migrainosus: Secondary | ICD-10-CM

## 2013-03-07 DIAGNOSIS — F419 Anxiety disorder, unspecified: Secondary | ICD-10-CM

## 2013-03-07 DIAGNOSIS — F32A Depression, unspecified: Secondary | ICD-10-CM

## 2013-03-07 DIAGNOSIS — J309 Allergic rhinitis, unspecified: Secondary | ICD-10-CM

## 2013-03-07 HISTORY — DX: Migraine, unspecified, not intractable, without status migrainosus: G43.909

## 2013-03-07 LAB — LIPID PANEL
Cholesterol: 198 mg/dL (ref 0–200)
LDL Cholesterol: 108 mg/dL — ABNORMAL HIGH (ref 0–99)
Total CHOL/HDL Ratio: 3.2 Ratio
VLDL: 29 mg/dL (ref 0–40)

## 2013-03-07 LAB — RENAL FUNCTION PANEL
Albumin: 4.3 g/dL (ref 3.5–5.2)
CO2: 23 mEq/L (ref 19–32)
Chloride: 104 mEq/L (ref 96–112)
Creat: 0.68 mg/dL (ref 0.50–1.10)
Glucose, Bld: 79 mg/dL (ref 70–99)

## 2013-03-07 LAB — CBC
MCHC: 33.6 g/dL (ref 30.0–36.0)
MCV: 84.5 fL (ref 78.0–100.0)
Platelets: 219 10*3/uL (ref 150–400)
RDW: 15.5 % (ref 11.5–15.5)
WBC: 5.4 10*3/uL (ref 4.0–10.5)

## 2013-03-07 LAB — HEPATIC FUNCTION PANEL
ALT: 25 U/L (ref 0–35)
AST: 17 U/L (ref 0–37)
Albumin: 4.3 g/dL (ref 3.5–5.2)
Alkaline Phosphatase: 102 U/L (ref 39–117)
Bilirubin, Direct: 0.1 mg/dL (ref 0.0–0.3)

## 2013-03-07 LAB — T4, FREE: Free T4: 1.15 ng/dL (ref 0.80–1.80)

## 2013-03-07 MED ORDER — VENLAFAXINE HCL 50 MG PO TABS: 50.0000 mg | ORAL_TABLET | Freq: Two times a day (BID) | ORAL | Status: DC

## 2013-03-07 MED ORDER — KETOROLAC TROMETHAMINE 30 MG/ML IJ SOLN
30.0000 mg | Freq: Once | INTRAMUSCULAR | Status: AC
  Administered 2013-03-07: 30 mg via INTRAMUSCULAR

## 2013-03-07 MED ORDER — SUMATRIPTAN SUCCINATE 50 MG PO TABS: 50.0000 mg | ORAL_TABLET | ORAL | Status: DC | PRN

## 2013-03-07 MED ORDER — PROMETHAZINE HCL 25 MG PO TABS: 25.0000 mg | ORAL_TABLET | Freq: Three times a day (TID) | ORAL | Status: DC | PRN

## 2013-03-07 MED ORDER — OMEPRAZOLE 20 MG PO CPDR: 20.0000 mg | DELAYED_RELEASE_CAPSULE | Freq: Two times a day (BID) | ORAL | Status: DC | PRN

## 2013-03-07 MED ORDER — KETOROLAC TROMETHAMINE 30 MG/ML IM SOLN: 30.0000 mg | Freq: Once | INTRAMUSCULAR | Status: DC

## 2013-03-07 MED ORDER — MELOXICAM 15 MG PO TABS: 15.0000 mg | ORAL_TABLET | Freq: Every day | ORAL | Status: DC

## 2013-03-07 NOTE — Assessment & Plan Note (Signed)
Increased sertraline is helping to lift her depressive feelings but is causing increased anxiety. Will try changing to Venlafaxine 50 mg po bid and stop the Sertraline

## 2013-03-07 NOTE — Assessment & Plan Note (Signed)
Well controlled, no changes 

## 2013-03-07 NOTE — Assessment & Plan Note (Signed)
Has had trouble off and on for years. Mostly over right eye. This time present for 3-4 days. Given Toradol 30 mg in office and given rx for Imitrex 50 mg and Phenergan 25 mg to use prn for HA/nausea. Encouraged good rest, hydration and po intake

## 2013-03-10 NOTE — Progress Notes (Signed)
Patient ID: Nicole Ball, female   DOB: 1976/06/07, 37 y.o.   MRN: [id number] FAVOUR ALESHIRE [id number] 29-Jan-1976 03/10/2013      Progress Note-Follow Up  Subjective  Chief Complaint  Chief Complaint  Patient presents with  . Follow-up    discuss Zoloft and sugar  . Headache    X a few days    HPI  37 year old [place] American female who is in today with multiple concerns. She's had a migraine with photophobia and pain for about 3 days now. She is complaining of frequent hot flashes and irritability as well and nose. No fevers or chills but she has had some nasal congestion. No chest pain or palpitations shortness or breath no GI or GU complaints. Continues to deal with a great deal of stress around her husbands health  Past Medical History  Diagnosis Date  . Allergy     seasonal  . Acute low back pain 12/19/2010  . Overweight(278.02) 10/16/2010  . SNORING 10/16/2010  . UTI'S, HX OF 10/16/2010  . INSOMNIA 10/16/2010  . HIATAL HERNIA WITH REFLUX, HX OF 10/16/2010  . ALLERGIC RHINITIS 10/16/2010  . Hyperlipidemia 04/21/2011  . Elevated BP 04/21/2011  . Sleep apnea 06/07/2011  . Anxiety and depression 06/07/2011  . Amenorrhea 07/11/2011  . Diabetes mellitus 10/30/2011  . Knee pain, left 03/11/2012  . Reflux 04/22/2012  . Acute sinusitis 08/06/2012  . Migraine 03/07/2013    History reviewed. No pertinent past surgical history.  Family History  Problem Relation Age of Onset  . Lupus Mother   . Arthritis Maternal Grandmother   . Scleroderma Maternal Grandmother   . Glaucoma Maternal Grandmother   . Cataracts Maternal Grandmother   . Cancer Maternal Grandfather     prostate  . Coronary artery disease Maternal Grandfather     s/p angioplasty  . Diabetes Maternal Grandfather     Type 2  . Cancer Paternal Grandmother     Breast ca- dx in 30's  . Thyroid disease Paternal Grandmother   . Diabetes Paternal Grandfather   . Thyroid disease Sister   . Asthma Sister   . Allergies  Brother   . Arthritis Other     History   Social History  . Marital Status: Married    Spouse Name: N/A    Number of Children: N/A  . Years of Education: N/A   Occupational History  . Not on file.   Social History Main Topics  . Smoking status: Current Some Day Smoker -- 0.10 packs/day    Types: Cigars  . Smokeless tobacco: Never Used     Comment: 1 black and milds  . Alcohol Use: Yes     Comment: occasional  . Drug Use: No  . Sexually Active: Yes -- Female partner(s)   Other Topics Concern  . Not on file   Social History Narrative  . No narrative on file    Current Outpatient Prescriptions on File Prior to Visit  Medication Sig Dispense Refill  . ALPRAZolam (XANAX) 0.5 MG tablet TAKE 1 TABLET BY MOUTH THREE TIMES DAILY AS NEEDED FOR ANXIETY OR SLEEP  90 tablet  0  . BAYER MICROLET LANCETS lancets Use as directed to test daily and as needed for symptoms  100 each  0  . Blood Glucose Monitoring Suppl (CONTOUR BLOOD GLUCOSE SYSTEM) DEVI Use as directed to test daily and as needed for symptoms  1 Device  0  . fexofenadine (ALLEGRA) 180 MG tablet Take 1 tablet (180 mg  total) by mouth daily as needed.  30 tablet  5  . fluticasone (FLONASE) 50 MCG/ACT nasal spray Place 2 sprays into the nose daily.  16 g  6  . glucose blood (BAYER CONTOUR TEST) test strip Use as directed to test daily and as needed for symptoms  100 each  0  . HYDROcodone-acetaminophen (NORCO/VICODIN) 5-325 MG per tablet TAKE 1 TABLET BY MOUTH EVERY 6 HOURS AS NEEDED  100 tablet  0  . metFORMIN (GLUCOPHAGE) 500 MG tablet Take 1 tablet (500 mg total) by mouth at bedtime.  30 tablet  5  . Norethindrone Acetate-Ethinyl Estradiol (LOESTRIN 1.5/30, 21,) 1.5-30 MG-MCG tablet Take 1 tablet by mouth daily.      . ranitidine (ZANTAC) 300 MG tablet Take 1 tablet (300 mg total) by mouth at bedtime.  30 tablet  5  . Saline 0.65 % (SOLN) SOLN Place 2 Squirts into the nose 2 (two) times daily.  1 Bottle  0  . zolpidem (AMBIEN)  10 MG tablet TAKE 1 TABLET BY MOUTH AT BEDTIME AS NEEDED  30 tablet  0   No current facility-administered medications on file prior to visit.    No Known Allergies  Review of Systems  Review of Systems  Constitutional: Negative for fever and malaise/fatigue.  HENT: Negative for congestion.   Eyes: Negative for pain and discharge.  Respiratory: Negative for shortness of breath.   Cardiovascular: Negative for chest pain, palpitations and leg swelling.  Gastrointestinal: Negative for nausea, abdominal pain and diarrhea.  Genitourinary: Negative for dysuria.  Musculoskeletal: Negative for falls.  Skin: Negative for rash.  Neurological: Positive for headaches. Negative for loss of consciousness.  Endo/Heme/Allergies: Negative for polydipsia.  Psychiatric/Behavioral: Positive for depression. Negative for suicidal ideas. The patient is nervous/anxious. The patient does not have insomnia.     Objective  BP 124/84  Pulse 79  Temp(Src) 98.1 F (36.7 C) (Oral)  Ht 5\' 3"  (1.6 m)  Wt 195 lb (88.451 kg)  BMI 34.55 kg/m2  SpO2 98%  LMP 01/05/2013  Physical Exam  Physical Exam  Constitutional: She is oriented to person, place, and time and well-developed, well-nourished, and in no distress. No distress.  HENT:  Head: Normocephalic and atraumatic.  Eyes: Conjunctivae are normal.  Neck: Neck supple. No thyromegaly present.  Cardiovascular: Normal rate, regular rhythm and normal heart sounds.  Exam reveals no gallop.   No murmur heard. Pulmonary/Chest: Effort normal and breath sounds normal. She has no wheezes.  Abdominal: She exhibits no distension and no mass.  Musculoskeletal: She exhibits no edema.  Lymphadenopathy:    She has no cervical adenopathy.  Neurological: She is alert and oriented to person, place, and time.  Skin: Skin is warm and dry. No rash noted. She is not diaphoretic.  Psychiatric: Memory, affect and judgment normal.    Lab Results  Component Value Date    TSH 0.801 03/07/2013   Lab Results  Component Value Date   WBC 5.4 03/07/2013   HGB 13.2 03/07/2013   HCT 39.3 03/07/2013   MCV 84.5 03/07/2013   PLT 219 03/07/2013   Lab Results  Component Value Date   CREATININE 0.68 03/07/2013   BUN 11 03/07/2013   NA 138 03/07/2013   K 3.9 03/07/2013   CL 104 03/07/2013   CO2 23 03/07/2013   Lab Results  Component Value Date   ALT 25 03/07/2013   AST 17 03/07/2013   ALKPHOS 102 03/07/2013   BILITOT 0.3 03/07/2013   Lab Results  Component Value Date   CHOL 198 03/07/2013   Lab Results  Component Value Date   HDL 61 03/07/2013   Lab Results  Component Value Date   LDLCALC 108* 03/07/2013   Lab Results  Component Value Date   TRIG 145 03/07/2013   Lab Results  Component Value Date   CHOLHDL 3.2 03/07/2013     Assessment & Plan  Migraine Has had trouble off and on for years. Mostly over right eye. This time present for 3-4 days. Given Toradol 30 mg in office and given rx for Imitrex 50 mg and Phenergan 25 mg to use prn for HA/nausea. Encouraged good rest, hydration and po intake  Elevated BP Well controlled, no changes  Anxiety and depression Increased sertraline is helping to lift her depressive feelings but is causing increased anxiety. Will try changing to Venlafaxine 50 mg po bid and stop the Sertraline  Reflux No c/o today  ALLERGIC RHINITIS nasacort and fexofenadine encouraged.  Hyperlipidemia Avoid trans fats. Start krill oil, increase exercise

## 2013-03-10 NOTE — Assessment & Plan Note (Signed)
No c/o today 

## 2013-03-10 NOTE — Assessment & Plan Note (Signed)
Avoid trans fats. Start krill oil, increase exercise

## 2013-03-10 NOTE — Assessment & Plan Note (Signed)
nasacort and fexofenadine encouraged.

## 2013-03-13 ENCOUNTER — Other Ambulatory Visit: Payer: Self-pay | Admitting: Family Medicine

## 2013-03-14 ENCOUNTER — Other Ambulatory Visit: Payer: Self-pay | Admitting: Family Medicine

## 2013-03-14 MED ORDER — FLUTICASONE PROPIONATE 50 MCG/ACT NA SUSP: 2.0000 | Freq: Every day | NASAL | Status: DC

## 2013-03-14 MED ORDER — ALPRAZOLAM 0.5 MG PO TABS: 0.5000 mg | ORAL_TABLET | Freq: Two times a day (BID) | ORAL | Status: DC | PRN

## 2013-03-14 NOTE — Telephone Encounter (Signed)
Patient calling to speak with [facility] in office.  Declines to discuss with RN.  Info to office per patient request for call back.  May reach patient at [phone].  krs/can

## 2013-03-14 NOTE — Telephone Encounter (Signed)
Flonase sent to pharmacy and pt informed.   Please just advise the Alprazolam refill?

## 2013-03-14 NOTE — Telephone Encounter (Signed)
Pt states her head is slightly hurting but its getting better. Pt states her sinus drainage is bad again, cough is back and she is starting to have chest congestion. Pt would like to know what she should take? Pt did start an antihistamine. Please advise?  Pt also stated that she needs a refill on her Alprazolam? Last RX was done on 02-10-13 quantity 90 with 0 refills.  Please advise both?  If RX is ok please send to [phone]

## 2013-03-14 NOTE — Telephone Encounter (Signed)
Can we refill the Alprazolam?

## 2013-03-14 NOTE — Telephone Encounter (Signed)
Have her start a nose spra y such as Flonase if not taking and some OTC ColdEeze with zinc to fight off a possible viral infection

## 2013-03-14 NOTE — Telephone Encounter (Signed)
eScribe request for refill on Hydrocodone-APAP Last filled - 07.04.14, #100x0  eScribe request for refill on Zolpidem Last filled - 07.03.14, #30x0  Last AEX - 07.28.14 Please Advise on refills/SLS

## 2013-03-15 ENCOUNTER — Telehealth: Payer: Self-pay | Admitting: Family Medicine

## 2013-03-15 NOTE — Telephone Encounter (Signed)
Patient Information:  Caller Name: Hajar  Phone: [phone]  Patient: Nicole Ball, Nicole Ball  Gender: Female  DOB: [birth year]  Age: 37 Years  PCP: Danise [facility])  Pregnant: No  Office Follow Up:  Does the office need to follow up with this patient?: No  Instructions For The Office: N/A  RN Note:  On BCP; menses irregalar-last menses May 2014. Husband is infertile. Concerned about sinus infection symptoms with sinus headaches and discolored nasal mucus. Reports "a little swelling" on cheeks. FBS not tested today. Nasal mucus smells bad.  Advised to hydrate and humidify. Instructed to blow nose gently by putting pressure against one nostril to blow one side only.    Symptoms  Reason For Call & Symptoms: Called regarding "hocked up" bloody mucus once from nostril 03/15/13, productive cough for past 2 weeks with post nasal drip.  Has not started Allegra.  Spoke with Kristy/office nurse 03/14/13.  Reviewed Health History In EMR: Yes  Reviewed Medications In EMR: Yes  Reviewed Allergies In EMR: Yes  Reviewed Surgeries / Procedures: Yes  Date of Onset of Symptoms: 03/01/2013  Treatments Tried: Flonase, Mucinex, decongestant, Cold -Eze (zinc), Norco  Treatments Tried Worked: No OB / GYN:  LMP: Unknown  Guideline(s) Used:  Sinus Pain and Congestion  Disposition Per Guideline:   Go to Office Now  Reason For Disposition Reached:   Redness or swelling on the cheek, forehead, or around the eye  Advice Given:  Hydration:  Drink plenty of liquids (6-8 glasses of water daily). If the air in your home is dry, use a cool mist humidifier  Expected Course:  Sinus congestion from viral upper respiratory infections (colds) usually lasts 5-10 days.  Occasionally a cold can worsen and turn into bacterial sinusitis. Clues to this are sinus symptoms lasting longer than 10 days, fever lasting longer than 3 days, and worsening pain. Bacterial sinusitis may need antibiotic treatment.  Call  Back If:   Severe pain lasts longer than 2 hours after pain medicine  Sinus congestion (fullness) lasts longer than 10 days  You become worse.  Patient Refused Recommendation:  Patient Will Make Own Appointment  Declined to schedule appointment for 03/16/13; stated will call back for appointment.

## 2013-03-15 NOTE — Telephone Encounter (Signed)
RX faxed

## 2013-04-05 ENCOUNTER — Other Ambulatory Visit: Payer: Self-pay | Admitting: Family Medicine

## 2013-04-14 ENCOUNTER — Ambulatory Visit (INDEPENDENT_AMBULATORY_CARE_PROVIDER_SITE_OTHER): Payer: BC Managed Care – PPO | Admitting: Physician Assistant

## 2013-04-14 ENCOUNTER — Ambulatory Visit: Payer: BC Managed Care – PPO | Admitting: Family Medicine

## 2013-04-14 ENCOUNTER — Telehealth: Payer: Self-pay | Admitting: Family Medicine

## 2013-04-14 ENCOUNTER — Encounter: Payer: Self-pay | Admitting: Physician Assistant

## 2013-04-14 VITALS — BP 122/88 | HR 94 | Temp 97.8°F | Resp 16 | Wt 192.0 lb

## 2013-04-14 DIAGNOSIS — F411 Generalized anxiety disorder: Secondary | ICD-10-CM

## 2013-04-14 DIAGNOSIS — G43909 Migraine, unspecified, not intractable, without status migrainosus: Secondary | ICD-10-CM

## 2013-04-14 DIAGNOSIS — F419 Anxiety disorder, unspecified: Secondary | ICD-10-CM

## 2013-04-14 DIAGNOSIS — J309 Allergic rhinitis, unspecified: Secondary | ICD-10-CM

## 2013-04-14 MED ORDER — ALPRAZOLAM 0.5 MG PO TABS: 0.5000 mg | ORAL_TABLET | Freq: Three times a day (TID) | ORAL | Status: DC | PRN

## 2013-04-14 NOTE — Telephone Encounter (Signed)
Patient called in requesting that a 90 day supply of alprazolam be sent to [facility] on [address]

## 2013-04-14 NOTE — Assessment & Plan Note (Signed)
Patient with post-nasal drip.  Encourage daily claritin.  Refills for Flonase at pharmacy to be taken daily.

## 2013-04-14 NOTE — Telephone Encounter (Signed)
Cody informed patient at visit today

## 2013-04-14 NOTE — Patient Instructions (Signed)
Please take Imitrex to help with migraine.  I encourage rest and hydration as well as daily zyrtec and flonase nasal spray daily for post-nasal drip.  Please call or return to clinic if symptoms worsen You should have plenty of refills at the pharmacy for Flonase.  You already have a prescription for Imitrex at the pharmacy.  We will make sure your next prescription for xanax is for three times a day.  Allergic Rhinitis Allergic rhinitis is when the mucous membranes in the nose respond to allergens. Allergens are particles in the air that cause your body to have an allergic reaction. This causes you to release allergic antibodies. Through a chain of events, these eventually cause you to release histamine into the blood stream (hence the use of antihistamines). Although meant to be protective to the body, it is this release that causes your discomfort, such as frequent sneezing, congestion and an itchy runny nose.  CAUSES  The pollen allergens may come from grasses, trees, and weeds. This is seasonal allergic rhinitis, or "hay fever." Other allergens cause year-round allergic rhinitis (perennial allergic rhinitis) such as house dust mite allergen, pet dander and mold spores.  SYMPTOMS   Nasal stuffiness (congestion).  Runny, itchy nose with sneezing and tearing of the eyes.  There is often an itching of the mouth, eyes and ears. It cannot be cured, but it can be controlled with medications. DIAGNOSIS  If you are unable to determine the offending allergen, skin or blood testing may find it. TREATMENT   Avoid the allergen.  Medications and allergy shots (immunotherapy) can help.  Hay fever may often be treated with antihistamines in pill or nasal spray forms. Antihistamines block the effects of histamine. There are over-the-counter medicines that may help with nasal congestion and swelling around the eyes. Check with your caregiver before taking or giving this medicine. If the treatment above  does not work, there are many new medications your caregiver can prescribe. Stronger medications may be used if initial measures are ineffective. Desensitizing injections can be used if medications and avoidance fails. Desensitization is when a patient is given ongoing shots until the body becomes less sensitive to the allergen. Make sure you follow up with your caregiver if problems continue. SEEK MEDICAL CARE IF:   You develop fever (more than 100.5 F (38.1 C).  You develop a cough that does not stop easily (persistent).  You have shortness of breath.  You start wheezing.  Symptoms interfere with normal daily activities. Document Released: 04/22/2001 Document Revised: 10/20/2011 Document Reviewed: 11/01/2008 [facility] Patient Information 2014 [place], [place].

## 2013-04-14 NOTE — Telephone Encounter (Signed)
Sorry if she needs 60 a month, because this is a controlled substance we do not give so many at one time

## 2013-04-14 NOTE — Assessment & Plan Note (Addendum)
Mild pain and significant photophobia.  Sumatriptan refill.

## 2013-04-14 NOTE — Telephone Encounter (Signed)
Pt seen and it was determined that pt had previously been prescribed medication for three times daily. Rx was faxed to [facility] today for #90 (three times daily dosing).

## 2013-04-14 NOTE — Telephone Encounter (Signed)
Please advise 90 day supply? Last RX was 03-14-13 quantity 60 with 1 refill?   If ok fax to [phone]

## 2013-04-14 NOTE — Progress Notes (Signed)
Patient ID: Nicole Ball, female   DOB: Oct 16, 1975, 37 y.o.   MRN: [id number]  Patient presents to clinic today c/o headache, photophobia, and pressure behind her eyes for 2 days.  Information was obtained from the patient.  Patient states that she has a history of migraines but is out of her imitrex.  Symptoms feel similar to past migraines she has had.  Patient also notes that she has had some nasal congestion and post-nasal drip.  Has history of allergies but does not take anything for them.  Denies sinus pain, tooth pain, ear ache, cough, shortness of breath, fever, chills, sweats, vomiting, C/D.  Endorses some nausea associated with headache.  Patient also reports she is out of her xanax.  States her prior Rx was for Xanax TID.  When refill was made it was written for BID.  Reviewed patient's record and fixed error. Discussed this with prescribing provider Dr. Abner Greenspan.   Past Medical History  Diagnosis Date  . Allergy     seasonal  . Acute low back pain 12/19/2010  . Overweight(278.02) 10/16/2010  . SNORING 10/16/2010  . UTI'S, HX OF 10/16/2010  . INSOMNIA 10/16/2010  . HIATAL HERNIA WITH REFLUX, HX OF 10/16/2010  . ALLERGIC RHINITIS 10/16/2010  . Hyperlipidemia 04/21/2011  . Elevated BP 04/21/2011  . Sleep apnea 06/07/2011  . Anxiety and depression 06/07/2011  . Amenorrhea 07/11/2011  . Diabetes mellitus 10/30/2011  . Knee pain, left 03/11/2012  . Reflux 04/22/2012  . Acute sinusitis 08/06/2012  . Migraine 03/07/2013    Current Outpatient Prescriptions on File Prior to Visit  Medication Sig Dispense Refill  . BAYER MICROLET LANCETS lancets Use as directed to test daily and as needed for symptoms  100 each  0  . Blood Glucose Monitoring Suppl (CONTOUR BLOOD GLUCOSE SYSTEM) DEVI Use as directed to test daily and as needed for symptoms  1 Device  0  . fexofenadine (ALLEGRA) 180 MG tablet Take 1 tablet (180 mg total) by mouth daily as needed.  30 tablet  5  . fluticasone (FLONASE) 50 MCG/ACT nasal  spray Place 2 sprays into the nose daily.  16 g  6  . glucose blood (BAYER CONTOUR TEST) test strip Use as directed to test daily and as needed for symptoms  100 each  0  . HYDROcodone-acetaminophen (NORCO/VICODIN) 5-325 MG per tablet TAKE 1 TABLET BY MOUTH EVERY 6 HOURS AS NEEDED  100 tablet  0  . meloxicam (MOBIC) 15 MG tablet Take 1 tablet (15 mg total) by mouth daily.  90 tablet  3  . metFORMIN (GLUCOPHAGE) 500 MG tablet Take 1 tablet (500 mg total) by mouth at bedtime.  30 tablet  5  . Norethindrone Acetate-Ethinyl Estradiol (LOESTRIN 1.5/30, 21,) 1.5-30 MG-MCG tablet Take 1 tablet by mouth daily.      Marland Kitchen omeprazole (PRILOSEC) 20 MG capsule Take 1 capsule (20 mg total) by mouth 2 (two) times daily as needed. reflux  60 capsule  3  . promethazine (PHENERGAN) 25 MG tablet Take 1 tablet (25 mg total) by mouth every 8 (eight) hours as needed for nausea.  20 tablet  1  . ranitidine (ZANTAC) 300 MG tablet Take 1 tablet (300 mg total) by mouth at bedtime.  30 tablet  5  . Saline 0.65 % (SOLN) SOLN Place 2 Squirts into the nose 2 (two) times daily.  1 Bottle  0  . SUMAtriptan (IMITREX) 50 MG tablet TAKE 1 TABLET BY MOUTH EVERY 2 HOURS AS NEEDED FOR  MIGRAINE  10 tablet  0  . venlafaxine (EFFEXOR) 50 MG tablet Take 1 tablet (50 mg total) by mouth 2 (two) times daily.  60 tablet  2  . zolpidem (AMBIEN) 10 MG tablet TAKE 1 TABLET BY MOUTH AT BEDTIME AS NEEDED  30 tablet  0   No current facility-administered medications on file prior to visit.    No Known Allergies  Family History  Problem Relation Age of Onset  . Lupus Mother   . Arthritis Maternal Grandmother   . Scleroderma Maternal Grandmother   . Glaucoma Maternal Grandmother   . Cataracts Maternal Grandmother   . Cancer Maternal Grandfather     prostate  . Coronary artery disease Maternal Grandfather     s/p angioplasty  . Diabetes Maternal Grandfather     Type 2  . Cancer Paternal Grandmother     Breast ca- dx in 96's  . Thyroid  disease Paternal Grandmother   . Diabetes Paternal Grandfather   . Thyroid disease Sister   . Asthma Sister   . Allergies Brother   . Arthritis Other     History   Social History  . Marital Status: Married    Spouse Name: N/A    Number of Children: N/A  . Years of Education: N/A   Social History Main Topics  . Smoking status: Current Some Day Smoker -- 0.10 packs/day    Types: Cigars  . Smokeless tobacco: Never Used     Comment: 1 black and milds  . Alcohol Use: Yes     Comment: occasional  . Drug Use: No  . Sexual Activity: Yes    Partners: Male   Other Topics Concern  . None   Social History Narrative  . None   Review of Systems  Constitutional: Negative for fever, chills and malaise/fatigue.  HENT: Positive for sore throat. Negative for ear pain and congestion.   Eyes: Positive for photophobia. Negative for blurred vision, double vision and pain.  Respiratory: Negative for cough.   Cardiovascular: Negative for chest pain and palpitations.  Neurological: Positive for headaches.   Filed Vitals:   04/14/13 1323  BP: 122/88  Pulse: 94  Temp: 97.8 F (36.6 C)  Resp: 16    Physical Exam  Constitutional: She is oriented to person, place, and time and well-developed, well-nourished, and in no distress.  HENT:  Head: Normocephalic and atraumatic.  Right Ear: External ear normal.  Left Ear: External ear normal.  Nose: Nose normal.  Mouth/Throat: Oropharynx is clear and moist. No oropharyngeal exudate.  TM WNL bilaterally.  No tenderness with palpation over maxillary and frontal sinuses.  Eyes: Conjunctivae are normal. Pupils are equal, round, and reactive to light.  Neck: Neck supple.  Cardiovascular: Normal rate, regular rhythm, normal heart sounds and intact distal pulses.   Pulmonary/Chest: Effort normal and breath sounds normal.  Lymphadenopathy:    She has no cervical adenopathy.  Neurological: She is alert and oriented to person, place, and time.   Skin: Skin is warm and dry. No rash noted.   Recent Results (from the past 2160 hour(s))  TSH     Status: None   Collection Time    03/07/13 12:00 PM      Result Value Range   TSH 0.801  0.350 - 4.500 uIU/mL  T4, FREE     Status: None   Collection Time    03/07/13 12:00 PM      Result Value Range   Free T4 1.15  0.80 -  1.80 ng/dL  RENAL FUNCTION PANEL     Status: None   Collection Time    03/07/13 12:00 PM      Result Value Range   Sodium 138  135 - 145 mEq/L   Potassium 3.9  3.5 - 5.3 mEq/L   Chloride 104  96 - 112 mEq/L   CO2 23  19 - 32 mEq/L   Glucose, Bld 79  70 - 99 mg/dL   BUN 11  6 - 23 mg/dL   Creat 1.61  0.96 - 0.45 mg/dL   Albumin 4.3  3.5 - 5.2 g/dL   Calcium 9.8  8.4 - 40.9 mg/dL   Phosphorus 3.9  2.3 - 4.6 mg/dL  HEPATIC FUNCTION PANEL     Status: None   Collection Time    03/07/13 12:00 PM      Result Value Range   Total Bilirubin 0.3  0.3 - 1.2 mg/dL   Bilirubin, Direct 0.1  0.0 - 0.3 mg/dL   Indirect Bilirubin 0.2  0.0 - 0.9 mg/dL   Alkaline Phosphatase 102  39 - 117 U/L   AST 17  0 - 37 U/L   ALT 25  0 - 35 U/L   Total Protein 7.6  6.0 - 8.3 g/dL   Albumin 4.3  3.5 - 5.2 g/dL  CBC     Status: None   Collection Time    03/07/13 12:00 PM      Result Value Range   WBC 5.4  4.0 - 10.5 K/uL   RBC 4.65  3.87 - 5.11 MIL/uL   Hemoglobin 13.2  12.0 - 15.0 g/dL   HCT 81.1  91.4 - 78.2 %   MCV 84.5  78.0 - 100.0 fL   MCH 28.4  26.0 - 34.0 pg   MCHC 33.6  30.0 - 36.0 g/dL   RDW 95.6  21.3 - 08.6 %   Platelets 219  150 - 400 K/uL  HEMOGLOBIN A1C     Status: Abnormal   Collection Time    03/07/13 12:00 PM      Result Value Range   Hemoglobin A1C 6.3 (*) <5.7 %   Comment:                                                                            According to the ADA Clinical Practice Recommendations for 2011, when     HbA1c is used as a screening test:             >=6.5%   Diagnostic of Diabetes Mellitus                (if abnormal result is  confirmed)           5.7-6.4%   Increased risk of developing Diabetes Mellitus           References:Diagnosis and Classification of Diabetes Mellitus,Diabetes     Care,2011,34(Suppl 1):S62-S69 and Standards of Medical Care in             Diabetes - 2011,Diabetes Care,2011,34 (Suppl 1):S11-S61.         Mean Plasma Glucose 134 (*) <117 mg/dL  LIPID PANEL     Status: Abnormal   Collection Time  03/07/13 12:00 PM      Result Value Range   Cholesterol 198  0 - 200 mg/dL   Comment: ATP III Classification:           < 200        mg/dL        Desirable          200 - 239     mg/dL        Borderline High          >= 240        mg/dL        High         Triglycerides 145  <150 mg/dL   HDL 61  >16 mg/dL   Total CHOL/HDL Ratio 3.2     VLDL 29  0 - 40 mg/dL   LDL Cholesterol 109 (*) 0 - 99 mg/dL   Comment:       Total Cholesterol/HDL Ratio:CHD Risk                            Coronary Heart Disease Risk Table                                            Men       Women              1/2 Average Risk              3.4        3.3                  Average Risk              5.0        4.4               2X Average Risk              9.6        7.1               3X Average Risk             23.4       11.0     Use the calculated Patient Ratio above and the CHD Risk table      to determine the patient's CHD Risk.     ATP III Classification (LDL):           < 100        mg/dL         Optimal          100 - 129     mg/dL         Near or Above Optimal          130 - 159     mg/dL         Borderline High          160 - 189     mg/dL         High           > 190        mg/dL         Very High         Assessment/Plan: ALLERGIC RHINITIS Patient with post-nasal drip.  Encourage daily claritin.  Refills for Flonase at pharmacy to be taken daily.  Migraine Mild pain and significant photophobia.  Sumatriptan refill.

## 2013-04-20 ENCOUNTER — Other Ambulatory Visit: Payer: Self-pay

## 2013-04-20 NOTE — Telephone Encounter (Signed)
Patient left a message stating her headaches/migraines are getting better but she only has a couple more pills of the Imitrex. Pt would like a refill and would like it to be a 30 day supply (I believe insurance only pays for 9 or 10?).  Pt also stated she is not completely out of her pain medication but will be soon due to her headaches she has had to take more?    Please advise Imitrex refill was 04-05-13 quantity 10 with 0 refills Hydrocodone refill was 03-13-13 quantity 100 with 0 refills  If ok fax to [phone]

## 2013-04-21 ENCOUNTER — Other Ambulatory Visit: Payer: Self-pay | Admitting: Family Medicine

## 2013-04-21 MED ORDER — HYDROCODONE-ACETAMINOPHEN 5-325 MG PO TABS: 1.0000 | ORAL_TABLET | Freq: Four times a day (QID) | ORAL | Status: DC | PRN

## 2013-04-21 MED ORDER — SUMATRIPTAN SUCCINATE 50 MG PO TABS: 50.0000 mg | ORAL_TABLET | ORAL | Status: DC | PRN

## 2013-04-21 NOTE — Telephone Encounter (Signed)
RX faxed

## 2013-04-21 NOTE — Telephone Encounter (Signed)
Please advise? Last RX done on 03-13-13 quantity 30 with 0 refills  If ok fax to [phone]

## 2013-04-22 ENCOUNTER — Telehealth: Payer: Self-pay | Admitting: Family Medicine

## 2013-04-22 MED ORDER — GLUCOSE BLOOD VI STRP: ORAL_STRIP | Status: DC

## 2013-04-22 MED ORDER — BAYER MICROLET LANCETS MISC: Status: DC

## 2013-04-22 NOTE — Telephone Encounter (Signed)
Ambien was already done. I will send lancets and test strips to pharmacy

## 2013-04-22 NOTE — Telephone Encounter (Signed)
Patient states that she needs a refill on needles, lancets, and ambien sent to [facility] on [address].   Also, patient states that Dr. Abner Greenspan gives her 10 pills of sumatriptan at a time but would like to know if she could prescribe her more pills each time? Patient says that her insurance will only cover one fill a month?

## 2013-05-09 ENCOUNTER — Ambulatory Visit: Payer: BC Managed Care – PPO | Admitting: Family Medicine

## 2013-05-19 ENCOUNTER — Other Ambulatory Visit: Payer: Self-pay | Admitting: Family Medicine

## 2013-05-20 ENCOUNTER — Encounter: Payer: Self-pay | Admitting: Family Medicine

## 2013-05-20 ENCOUNTER — Ambulatory Visit (INDEPENDENT_AMBULATORY_CARE_PROVIDER_SITE_OTHER): Payer: BC Managed Care – PPO | Admitting: Family Medicine

## 2013-05-20 VITALS — BP 128/94 | HR 96 | Temp 98.4°F | Ht 63.0 in | Wt 193.1 lb

## 2013-05-20 DIAGNOSIS — IMO0001 Reserved for inherently not codable concepts without codable children: Secondary | ICD-10-CM

## 2013-05-20 DIAGNOSIS — M549 Dorsalgia, unspecified: Secondary | ICD-10-CM

## 2013-05-20 DIAGNOSIS — R030 Elevated blood-pressure reading, without diagnosis of hypertension: Secondary | ICD-10-CM

## 2013-05-20 DIAGNOSIS — M545 Low back pain, unspecified: Secondary | ICD-10-CM

## 2013-05-20 DIAGNOSIS — G4700 Insomnia, unspecified: Secondary | ICD-10-CM

## 2013-05-20 DIAGNOSIS — T7840XD Allergy, unspecified, subsequent encounter: Secondary | ICD-10-CM

## 2013-05-20 DIAGNOSIS — E119 Type 2 diabetes mellitus without complications: Secondary | ICD-10-CM

## 2013-05-20 DIAGNOSIS — Z23 Encounter for immunization: Secondary | ICD-10-CM

## 2013-05-20 DIAGNOSIS — Z5189 Encounter for other specified aftercare: Secondary | ICD-10-CM

## 2013-05-20 MED ORDER — HYDROCODONE-ACETAMINOPHEN 5-325 MG PO TABS: 1.0000 | ORAL_TABLET | Freq: Four times a day (QID) | ORAL | Status: DC | PRN

## 2013-05-20 MED ORDER — MONTELUKAST SODIUM 10 MG PO TABS: 10.0000 mg | ORAL_TABLET | Freq: Every day | ORAL | Status: DC

## 2013-05-20 MED ORDER — FLUTICASONE PROPIONATE 50 MCG/ACT NA SUSP: 2.0000 | Freq: Every day | NASAL | Status: DC

## 2013-05-20 MED ORDER — FEXOFENADINE HCL 180 MG PO TABS: 180.0000 mg | ORAL_TABLET | Freq: Every day | ORAL | Status: DC | PRN

## 2013-05-20 MED ORDER — CYCLOBENZAPRINE HCL 10 MG PO TABS: 10.0000 mg | ORAL_TABLET | Freq: Two times a day (BID) | ORAL | Status: DC | PRN

## 2013-05-20 NOTE — Progress Notes (Signed)
Patient ID: Nicole Ball, female   DOB: 1976-01-30, 37 y.o.   MRN: [id number] Nicole Ball [id number] [birth year] 05/20/2013      Progress Note-Follow Up  Subjective  Chief Complaint  Chief Complaint  Patient presents with  . Follow-up    2 month  . Injections    flu    HPI  Patient is a 37 year old American female who is in today for followup. Her back pain has been flared this week. She's having left lower back pain and difficulty moving to the pain. Notes movement increased pain. Has trouble finding a comfortable position. No radicular symptoms or incontinence. Darrold Junker well-controlled no recent illness or fevers. No chest pain, palpitations GI or GU concerns noted today.  Past Medical History  Diagnosis Date  . Allergy     seasonal  . Acute low back pain 12/19/2010  . Overweight(278.02) 10/16/2010  . SNORING 10/16/2010  . UTI'S, HX OF 10/16/2010  . INSOMNIA 10/16/2010  . HIATAL HERNIA WITH REFLUX, HX OF 10/16/2010  . ALLERGIC RHINITIS 10/16/2010  . Hyperlipidemia 04/21/2011  . Elevated BP 04/21/2011  . Sleep apnea 06/07/2011  . Anxiety and depression 06/07/2011  . Amenorrhea 07/11/2011  . Diabetes mellitus 10/30/2011  . Knee pain, left 03/11/2012  . Reflux 04/22/2012  . Acute sinusitis 08/06/2012  . Migraine 03/07/2013    History reviewed. No pertinent past surgical history.  Family History  Problem Relation Age of Onset  . Lupus Mother   . Arthritis Maternal Grandmother   . Scleroderma Maternal Grandmother   . Glaucoma Maternal Grandmother   . Cataracts Maternal Grandmother   . Cancer Maternal Grandfather     prostate  . Coronary artery disease Maternal Grandfather     s/p angioplasty  . Diabetes Maternal Grandfather     Type 2  . Cancer Paternal Grandmother     Breast ca- dx in 29's  . Thyroid disease Paternal Grandmother   . Diabetes Paternal Grandfather   . Thyroid disease Sister   . Asthma Sister   . Allergies Brother   . Arthritis Other      History   Social History  . Marital Status: Married    Spouse Name: N/A    Number of Children: N/A  . Years of Education: N/A   Occupational History  . Not on file.   Social History Main Topics  . Smoking status: Current Some Day Smoker -- 0.10 packs/day    Types: Cigars  . Smokeless tobacco: Never Used     Comment: 1 black and milds  . Alcohol Use: Yes     Comment: occasional  . Drug Use: No  . Sexual Activity: Yes    Partners: Male   Other Topics Concern  . Not on file   Social History Narrative  . No narrative on file    Current Outpatient Prescriptions on File Prior to Visit  Medication Sig Dispense Refill  . ALPRAZolam (XANAX) 0.5 MG tablet Take 1 tablet (0.5 mg total) by mouth 3 (three) times daily as needed for sleep or anxiety.  90 tablet  1  . BAYER MICROLET LANCETS lancets Use as directed to test daily and as needed for symptoms  100 each  1  . Blood Glucose Monitoring Suppl (CONTOUR BLOOD GLUCOSE SYSTEM) DEVI Use as directed to test daily and as needed for symptoms  1 Device  0  . glucose blood (BAYER CONTOUR TEST) test strip Use as directed to test daily and as needed for symptoms  100 each  1  . meloxicam (MOBIC) 15 MG tablet Take 1 tablet (15 mg total) by mouth daily.  90 tablet  3  . metFORMIN (GLUCOPHAGE) 500 MG tablet Take 1 tablet (500 mg total) by mouth at bedtime.  30 tablet  5  . Norethindrone Acetate-Ethinyl Estradiol (LOESTRIN 1.5/30, 21,) 1.5-30 MG-MCG tablet Take 1 tablet by mouth daily.      Marland Kitchen omeprazole (PRILOSEC) 20 MG capsule Take 1 capsule (20 mg total) by mouth 2 (two) times daily as needed. reflux  60 capsule  3  . promethazine (PHENERGAN) 25 MG tablet Take 1 tablet (25 mg total) by mouth every 8 (eight) hours as needed for nausea.  20 tablet  1  . ranitidine (ZANTAC) 300 MG tablet TAKE 1 TABLET BY MOUTH AT BEDTIME  30 tablet  0  . Saline 0.65 % (SOLN) SOLN Place 2 Squirts into the nose 2 (two) times daily.  1 Bottle  0  . SUMAtriptan  (IMITREX) 50 MG tablet Take 1 tablet (50 mg total) by mouth every 2 (two) hours as needed for migraine (mas 2 tabs in 24 hours).  10 tablet  3  . venlafaxine (EFFEXOR) 50 MG tablet Take 1 tablet (50 mg total) by mouth 2 (two) times daily.  60 tablet  2  . zolpidem (AMBIEN) 10 MG tablet TAKE 1 TABLET BY MOUTH ONCE DAILY AT BEDTIME AS NEEDED  30 tablet  0   No current facility-administered medications on file prior to visit.    No Known Allergies  Review of Systems  Review of Systems  Constitutional: Positive for malaise/fatigue. Negative for fever.  HENT: Negative for congestion.   Eyes: Negative for discharge.  Respiratory: Negative for shortness of breath.   Cardiovascular: Negative for chest pain, palpitations and leg swelling.  Gastrointestinal: Negative for nausea, abdominal pain and diarrhea.  Genitourinary: Negative for dysuria.  Musculoskeletal: Positive for back pain. Negative for falls.  Skin: Negative for rash.  Neurological: Negative for loss of consciousness and headaches.  Endo/Heme/Allergies: Negative for polydipsia.  Psychiatric/Behavioral: Negative for depression and suicidal ideas. The patient is not nervous/anxious and does not have insomnia.     Objective  BP 128/94  Pulse 96  Temp(Src) 98.4 F (36.9 C) (Oral)  Ht 5\' 3"  (1.6 m)  Wt 193 lb 1.9 oz (87.599 kg)  BMI 34.22 kg/m2  SpO2 99%  Physical Exam  Physical Exam  Constitutional: She is oriented to person, place, and time and well-developed, well-nourished, and in no distress. No distress.  HENT:  Head: Normocephalic and atraumatic.  Eyes: Conjunctivae are normal.  Neck: Neck supple. No thyromegaly present.  Cardiovascular: Normal rate, regular rhythm and normal heart sounds.   No murmur heard. Pulmonary/Chest: Effort normal and breath sounds normal. She has no wheezes.  Abdominal: She exhibits no distension and no mass.  Musculoskeletal: She exhibits tenderness. She exhibits no edema.  Tender with  palp over lower back and b/l sacroiiliac joints  Lymphadenopathy:    She has no cervical adenopathy.  Neurological: She is alert and oriented to person, place, and time.  Skin: Skin is warm and dry. No rash noted. She is not diaphoretic.  Psychiatric: Memory, affect and judgment normal.    Lab Results  Component Value Date   TSH 0.801 03/07/2013   Lab Results  Component Value Date   WBC 5.4 03/07/2013   HGB 13.2 03/07/2013   HCT 39.3 03/07/2013   MCV 84.5 03/07/2013   PLT 219 03/07/2013   Lab  Results  Component Value Date   CREATININE 0.68 03/07/2013   BUN 11 03/07/2013   NA 138 03/07/2013   K 3.9 03/07/2013   CL 104 03/07/2013   CO2 23 03/07/2013   Lab Results  Component Value Date   ALT 25 03/07/2013   AST 17 03/07/2013   ALKPHOS 102 03/07/2013   BILITOT 0.3 03/07/2013   Lab Results  Component Value Date   CHOL 198 03/07/2013   Lab Results  Component Value Date   HDL 61 03/07/2013   Lab Results  Component Value Date   LDLCALC 108* 03/07/2013   Lab Results  Component Value Date   TRIG 145 03/07/2013   Lab Results  Component Value Date   CHOLHDL 3.2 03/07/2013     Assessment & Plan  Acute low back pain Given refill on Meloxicam, continue Flexeril and Norco prn. Encouraged moist heat and gentle stretching  Elevated BP Well controlled on current meds, minimize sodium and caffeine, monitor  Diabetes mellitus Given flu shots continue current meds, minimize simple carbs

## 2013-05-20 NOTE — Patient Instructions (Addendum)
Salon Pas patches/cream Digestive Advantage or a generic  Back Pain, Adult Low back pain is very common. About 1 in 5 people have back pain.The cause of low back pain is rarely dangerous. The pain often gets better over time.About half of people with a sudden onset of back pain feel better in just 2 weeks. About 8 in 10 people feel better by 6 weeks.  CAUSES Some common causes of back pain include:   Strain of the muscles or ligaments supporting the spine.  Wear and tear (degeneration) of the spinal discs.  Arthritis.  Direct injury to the back. DIAGNOSIS Most of the time, the direct cause of low back pain is not known.However, back pain can be treated effectively even when the exact cause of the pain is unknown.Answering your caregiver's questions about your overall health and symptoms is one of the most accurate ways to make sure the cause of your pain is not dangerous. If your caregiver needs more information, he or she may order lab work or imaging tests (X-rays or MRIs).However, even if imaging tests show changes in your back, this usually does not require surgery. HOME CARE INSTRUCTIONS For many people, back pain returns.Since low back pain is rarely dangerous, it is often a condition that people can learn to [facility] their own.   Remain active. It is stressful on the back to sit or stand in one place. Do not sit, drive, or stand in one place for more than 30 minutes at a time. Take short walks on level surfaces as soon as pain allows.Try to increase the length of time you walk each day.  Do not stay in bed.Resting more than 1 or 2 days can delay your recovery.  Do not avoid exercise or work.Your body is made to move.It is not dangerous to be active, even though your back may hurt.Your back will likely heal faster if you return to being active before your pain is gone.  Pay attention to your body when you bend and lift. Many people have less discomfortwhen lifting if  they bend their knees, keep the load close to their bodies,and avoid twisting. Often, the most comfortable positions are those that put less stress on your recovering back.  Find a comfortable position to sleep. Use a firm mattress and lie on your side with your knees slightly bent. If you lie on your back, put a pillow under your knees.  Only take over-the-counter or prescription medicines as directed by your caregiver. Over-the-counter medicines to reduce pain and inflammation are often the most helpful.Your caregiver may prescribe muscle relaxant drugs.These medicines help dull your pain so you can more quickly return to your normal activities and healthy exercise.  Put ice on the injured area.  Put ice in a plastic bag.  Place a towel between your skin and the bag.  Leave the ice on for 15-20 minutes, 3-4 times a day for the first 2 to 3 days. After that, ice and heat may be alternated to reduce pain and spasms.  Ask your caregiver about trying back exercises and gentle massage. This may be of some benefit.  Avoid feeling anxious or stressed.Stress increases muscle tension and can worsen back pain.It is important to recognize when you are anxious or stressed and learn ways to manage it.Exercise is a great option. SEEK MEDICAL CARE IF:  You have pain that is not relieved with rest or medicine.  You have pain that does not improve in 1 week.  You have  new symptoms.  You are generally not feeling well. SEEK IMMEDIATE MEDICAL CARE IF:   You have pain that radiates from your back into your legs.  You develop new bowel or bladder control problems.  You have unusual weakness or numbness in your arms or legs.  You develop nausea or vomiting.  You develop abdominal pain.  You feel faint. Document Released: 07/28/2005 Document Revised: 01/27/2012 Document Reviewed: 12/16/2010 [facility] Patient Information 2014 [place], [place].

## 2013-05-23 NOTE — Assessment & Plan Note (Signed)
Given refill on Meloxicam, continue Flexeril and Norco prn. Encouraged moist heat and gentle stretching

## 2013-05-23 NOTE — Assessment & Plan Note (Signed)
Given flu shots continue current meds, minimize simple carbs

## 2013-05-23 NOTE — Assessment & Plan Note (Signed)
Well controlled on current meds, minimize sodium and caffeine, monitor

## 2013-05-24 LAB — HM DIABETES EYE EXAM

## 2013-05-26 ENCOUNTER — Encounter: Payer: Self-pay | Admitting: Family Medicine

## 2013-05-30 ENCOUNTER — Other Ambulatory Visit: Payer: Self-pay | Admitting: Family Medicine

## 2013-06-02 ENCOUNTER — Telehealth: Payer: Self-pay

## 2013-06-02 NOTE — Telephone Encounter (Signed)
This is fine. She can have a note listing date of last office visit and note covering last few days. Say it is to cover her own and her husband's recent medical concerns

## 2013-06-02 NOTE — Telephone Encounter (Signed)
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  Please advise?

## 2013-06-03 NOTE — Telephone Encounter (Signed)
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 Pt states she can't afford the Venlafaxine. Pt would like to know if we have any samples of any of her medication or can it be changed to something cheaper?  Please advise?

## 2013-06-04 NOTE — Telephone Encounter (Signed)
We do not have samples. I did just have a patient go to [facility] after I suggested it and the cash pay price of the short acting Venlafaxine was very reasonable, 30 or 40 $ maybe, if she is interested in exploring this we can set this up.

## 2013-06-06 NOTE — Telephone Encounter (Signed)
Pt informed that letter is being mailed. Pt stated to hold off on sending the Venlafaxine until later. Pt will call back if she decides to have this sent to pharmacy

## 2013-06-22 ENCOUNTER — Telehealth: Payer: Self-pay

## 2013-06-22 DIAGNOSIS — M549 Dorsalgia, unspecified: Secondary | ICD-10-CM

## 2013-06-22 MED ORDER — HYDROCODONE-ACETAMINOPHEN 5-325 MG PO TABS: 1.0000 | ORAL_TABLET | Freq: Four times a day (QID) | ORAL | Status: DC | PRN

## 2013-06-22 NOTE — Telephone Encounter (Signed)
Patient called requesting a refill on Vicodin. Patient states that she is having trouble with her insurance and she currently does not have any. Her insurance will come back into play in January 2015. Patient wanted to know if she could have a refill on her medication without coming in to be seen because she will not be able to afford a office visit at this time.   Please advise,  Thanks!

## 2013-06-22 NOTE — Telephone Encounter (Signed)
Voicodin script printed.

## 2013-06-22 NOTE — Telephone Encounter (Signed)
She can have a refill on the vicodin same strength, same sig, #100. I will sign tomorrow.

## 2013-06-23 NOTE — Telephone Encounter (Signed)
Pt informed Rx ready for pick-up/SLS

## 2013-07-05 ENCOUNTER — Telehealth: Payer: Self-pay | Admitting: Family Medicine

## 2013-07-05 DIAGNOSIS — F419 Anxiety disorder, unspecified: Secondary | ICD-10-CM

## 2013-07-05 NOTE — Telephone Encounter (Signed)
Please advise refills?  Last Zolpidem RX was wrote on 04-21-13 quantity 30 with 0 refills  Last Alprazolam RX was wrote on 04-14-13 quantity 90 with 1 refill  If ok fax both to [phone]

## 2013-07-05 NOTE — Telephone Encounter (Signed)
Patient is requesting refills of alprazolam and zolpidem to be sent to [facility] pharmacy in [place].

## 2013-07-05 NOTE — Telephone Encounter (Signed)
OK to fill both

## 2013-07-06 MED ORDER — ALPRAZOLAM 0.5 MG PO TABS: 0.5000 mg | ORAL_TABLET | Freq: Three times a day (TID) | ORAL | Status: DC | PRN

## 2013-07-06 MED ORDER — ZOLPIDEM TARTRATE 10 MG PO TABS: ORAL_TABLET | ORAL | Status: DC

## 2013-07-06 NOTE — Telephone Encounter (Signed)
Rx request to pharmacy/SLS  

## 2013-07-26 ENCOUNTER — Other Ambulatory Visit: Payer: Self-pay | Admitting: Family Medicine

## 2013-07-26 ENCOUNTER — Telehealth: Payer: Self-pay | Admitting: Family Medicine

## 2013-07-26 DIAGNOSIS — M549 Dorsalgia, unspecified: Secondary | ICD-10-CM

## 2013-07-26 MED ORDER — HYDROCODONE-ACETAMINOPHEN 5-325 MG PO TABS: 1.0000 | ORAL_TABLET | Freq: Four times a day (QID) | ORAL | Status: DC | PRN

## 2013-07-26 NOTE — Telephone Encounter (Signed)
Ok to refill Norco

## 2013-07-26 NOTE — Telephone Encounter (Signed)
Patient called in requesting a new rx of norco and she states that she will pickup rx at the end of this week.

## 2013-07-26 NOTE — Telephone Encounter (Signed)
Please advise refill? Last RX was done on 06-22-13 quantity 100 with 0 refills

## 2013-07-26 NOTE — Telephone Encounter (Signed)
RX done. 

## 2013-07-27 ENCOUNTER — Telehealth: Payer: Self-pay | Admitting: Family Medicine

## 2013-07-27 NOTE — Telephone Encounter (Signed)
Patient needs samples of singulair

## 2013-07-28 NOTE — Telephone Encounter (Signed)
We do not have samples of this.  I tried to call pt and it states pt is not accepting calls at this time

## 2013-07-29 ENCOUNTER — Telehealth: Payer: Self-pay | Admitting: Family Medicine

## 2013-07-29 DIAGNOSIS — F419 Anxiety disorder, unspecified: Secondary | ICD-10-CM

## 2013-07-29 NOTE — Telephone Encounter (Signed)
refill-alprazolam 0.5mg  tablet. Take one tablet 3 times a day as needed for sleep/anxiety. Qty 90 last fill 11.26.14

## 2013-08-01 ENCOUNTER — Telehealth: Payer: Self-pay | Admitting: Family Medicine

## 2013-08-01 DIAGNOSIS — F419 Anxiety disorder, unspecified: Secondary | ICD-10-CM

## 2013-08-01 NOTE — Telephone Encounter (Signed)
refill-alprazolam 0.5mg tablet. Take one tablet 3 times a day as needed for sleep/anxiety. Qty 90 last fill 11.26.14 °

## 2013-08-02 ENCOUNTER — Encounter: Payer: Self-pay | Admitting: Family Medicine

## 2013-08-02 MED ORDER — ALPRAZOLAM 0.5 MG PO TABS: 0.5000 mg | ORAL_TABLET | Freq: Three times a day (TID) | ORAL | Status: DC | PRN

## 2013-08-02 NOTE — Telephone Encounter (Signed)
RX faxed

## 2013-08-02 NOTE — Telephone Encounter (Signed)
OK to refill Alprazolam, same strength, same sig, same number no rf

## 2013-08-02 NOTE — Telephone Encounter (Signed)
Please advise refill?  Last RX was done on 07-05-13 quantity 90 with 0 refills  If ok fax to [phone]

## 2013-08-02 NOTE — Telephone Encounter (Signed)
Pt informed

## 2013-08-10 NOTE — Telephone Encounter (Signed)
Close Encounter 

## 2013-08-22 ENCOUNTER — Telehealth: Payer: Self-pay | Admitting: Family Medicine

## 2013-08-22 NOTE — Telephone Encounter (Signed)
refill-zolpidem tartrate 10mg  tablet. Take one tablet at bedtime as needed for sleep. Qty 30 last fill 11.26.14

## 2013-08-23 NOTE — Telephone Encounter (Signed)
Please advise refill?  Last RX was done on 07-06-13 quantity 30 with 0 refills  If ok fax to [phone]

## 2013-08-23 NOTE — Telephone Encounter (Signed)
OK to refill Ambien #30 and 1 rf

## 2013-08-24 MED ORDER — ZOLPIDEM TARTRATE 10 MG PO TABS: ORAL_TABLET | ORAL | Status: DC

## 2013-08-24 NOTE — Telephone Encounter (Signed)
Rx called to pharmacy voicemail. 

## 2013-09-09 ENCOUNTER — Other Ambulatory Visit: Payer: Self-pay | Admitting: Family Medicine

## 2013-09-09 ENCOUNTER — Telehealth: Payer: Self-pay | Admitting: Family Medicine

## 2013-09-09 DIAGNOSIS — M549 Dorsalgia, unspecified: Secondary | ICD-10-CM

## 2013-09-09 MED ORDER — HYDROCODONE-ACETAMINOPHEN 5-325 MG PO TABS: 1.0000 | ORAL_TABLET | Freq: Four times a day (QID) | ORAL | Status: DC | PRN

## 2013-09-09 NOTE — Telephone Encounter (Signed)
OK to refill Norco, same strength, same sig #100, I will print

## 2013-09-09 NOTE — Telephone Encounter (Signed)
NORCO REFILL  

## 2013-09-09 NOTE — Telephone Encounter (Signed)
Left a message stating that RX is ready

## 2013-09-09 NOTE — Telephone Encounter (Signed)
Please advise refill? Last RX was done on 07-26-13, quantity 100 with 0 refills

## 2013-09-13 ENCOUNTER — Other Ambulatory Visit: Payer: Self-pay | Admitting: Family Medicine

## 2013-09-29 ENCOUNTER — Other Ambulatory Visit: Payer: Self-pay | Admitting: Family Medicine

## 2013-09-29 ENCOUNTER — Telehealth: Payer: Self-pay | Admitting: Family Medicine

## 2013-09-29 DIAGNOSIS — F419 Anxiety disorder, unspecified: Secondary | ICD-10-CM

## 2013-09-29 MED ORDER — ALPRAZOLAM 0.5 MG PO TABS: 0.5000 mg | ORAL_TABLET | Freq: Three times a day (TID) | ORAL | Status: DC | PRN

## 2013-09-29 NOTE — Telephone Encounter (Signed)
Refill alprazolam 

## 2013-09-29 NOTE — Telephone Encounter (Signed)
Faxed script [facility] to [facility].../lmb 

## 2013-09-29 NOTE — Telephone Encounter (Signed)
I have signed her rx please fax

## 2013-10-01 ENCOUNTER — Other Ambulatory Visit: Payer: Self-pay | Admitting: Family Medicine

## 2013-10-14 ENCOUNTER — Encounter: Payer: Self-pay | Admitting: Family Medicine

## 2013-10-14 ENCOUNTER — Ambulatory Visit (INDEPENDENT_AMBULATORY_CARE_PROVIDER_SITE_OTHER): Payer: BC Managed Care – PPO | Admitting: Family Medicine

## 2013-10-14 ENCOUNTER — Other Ambulatory Visit: Payer: Self-pay | Admitting: Family Medicine

## 2013-10-14 ENCOUNTER — Telehealth: Payer: Self-pay | Admitting: Family Medicine

## 2013-10-14 VITALS — BP 132/88 | HR 98 | Temp 97.9°F | Ht 63.0 in | Wt 196.1 lb

## 2013-10-14 DIAGNOSIS — E119 Type 2 diabetes mellitus without complications: Secondary | ICD-10-CM

## 2013-10-14 DIAGNOSIS — G43909 Migraine, unspecified, not intractable, without status migrainosus: Secondary | ICD-10-CM

## 2013-10-14 DIAGNOSIS — IMO0001 Reserved for inherently not codable concepts without codable children: Secondary | ICD-10-CM

## 2013-10-14 DIAGNOSIS — R030 Elevated blood-pressure reading, without diagnosis of hypertension: Secondary | ICD-10-CM

## 2013-10-14 DIAGNOSIS — Z23 Encounter for immunization: Secondary | ICD-10-CM

## 2013-10-14 DIAGNOSIS — M25562 Pain in left knee: Secondary | ICD-10-CM

## 2013-10-14 DIAGNOSIS — E785 Hyperlipidemia, unspecified: Secondary | ICD-10-CM

## 2013-10-14 DIAGNOSIS — T7840XA Allergy, unspecified, initial encounter: Secondary | ICD-10-CM

## 2013-10-14 DIAGNOSIS — G4700 Insomnia, unspecified: Secondary | ICD-10-CM

## 2013-10-14 DIAGNOSIS — N912 Amenorrhea, unspecified: Secondary | ICD-10-CM

## 2013-10-14 DIAGNOSIS — M549 Dorsalgia, unspecified: Secondary | ICD-10-CM

## 2013-10-14 DIAGNOSIS — F411 Generalized anxiety disorder: Secondary | ICD-10-CM

## 2013-10-14 DIAGNOSIS — M25569 Pain in unspecified knee: Secondary | ICD-10-CM

## 2013-10-14 DIAGNOSIS — F419 Anxiety disorder, unspecified: Secondary | ICD-10-CM

## 2013-10-14 LAB — CBC
HEMATOCRIT: 37.2 % (ref 36.0–46.0)
Hemoglobin: 12.6 g/dL (ref 12.0–15.0)
MCH: 28.4 pg (ref 26.0–34.0)
MCHC: 33.9 g/dL (ref 30.0–36.0)
MCV: 84 fL (ref 78.0–100.0)
PLATELETS: 219 10*3/uL (ref 150–400)
RBC: 4.43 MIL/uL (ref 3.87–5.11)
RDW: 14.8 % (ref 11.5–15.5)
WBC: 5.1 10*3/uL (ref 4.0–10.5)

## 2013-10-14 LAB — HEPATIC FUNCTION PANEL
ALT: 15 U/L (ref 0–35)
AST: 13 U/L (ref 0–37)
Albumin: 4.3 g/dL (ref 3.5–5.2)
Alkaline Phosphatase: 123 U/L — ABNORMAL HIGH (ref 39–117)
BILIRUBIN TOTAL: 0.3 mg/dL (ref 0.2–1.2)
Bilirubin, Direct: 0.1 mg/dL (ref 0.0–0.3)
Indirect Bilirubin: 0.2 mg/dL (ref 0.2–1.2)
TOTAL PROTEIN: 8 g/dL (ref 6.0–8.3)

## 2013-10-14 LAB — RENAL FUNCTION PANEL
Albumin: 4.3 g/dL (ref 3.5–5.2)
BUN: 11 mg/dL (ref 6–23)
CALCIUM: 9.9 mg/dL (ref 8.4–10.5)
CO2: 22 mEq/L (ref 19–32)
Chloride: 101 mEq/L (ref 96–112)
Creat: 0.68 mg/dL (ref 0.50–1.10)
Glucose, Bld: 130 mg/dL — ABNORMAL HIGH (ref 70–99)
PHOSPHORUS: 3.6 mg/dL (ref 2.3–4.6)
Potassium: 3.8 mEq/L (ref 3.5–5.3)
Sodium: 135 mEq/L (ref 135–145)

## 2013-10-14 LAB — LIPID PANEL
CHOL/HDL RATIO: 2.4 ratio
CHOLESTEROL: 186 mg/dL (ref 0–200)
HDL: 77 mg/dL (ref >39)
LDL CALC: 85 mg/dL (ref 0–99)
Triglycerides: 122 mg/dL (ref <150)
VLDL: 24 mg/dL (ref 0–40)

## 2013-10-14 MED ORDER — PROMETHAZINE HCL 25 MG PO TABS: 25.0000 mg | ORAL_TABLET | Freq: Three times a day (TID) | ORAL | Status: DC | PRN

## 2013-10-14 MED ORDER — ZOLPIDEM TARTRATE 10 MG PO TABS: ORAL_TABLET | ORAL | Status: DC

## 2013-10-14 MED ORDER — VENLAFAXINE HCL 50 MG PO TABS: 50.0000 mg | ORAL_TABLET | Freq: Three times a day (TID) | ORAL | Status: DC

## 2013-10-14 MED ORDER — ALPRAZOLAM 0.5 MG PO TABS: 0.5000 mg | ORAL_TABLET | Freq: Three times a day (TID) | ORAL | Status: DC | PRN

## 2013-10-14 MED ORDER — GLUCOSE BLOOD VI STRP: ORAL_STRIP | Status: DC

## 2013-10-14 MED ORDER — FEXOFENADINE HCL 180 MG PO TABS: 180.0000 mg | ORAL_TABLET | Freq: Every day | ORAL | Status: DC | PRN

## 2013-10-14 MED ORDER — HYDROCODONE-ACETAMINOPHEN 5-325 MG PO TABS: 1.0000 | ORAL_TABLET | Freq: Four times a day (QID) | ORAL | Status: DC | PRN

## 2013-10-14 MED ORDER — BAYER MICROLET LANCETS MISC: Status: DC

## 2013-10-14 NOTE — Progress Notes (Signed)
Patient ID: Nicole Ball, female   DOB: 08-05-1976, 38 y.o.   MRN: [id number] Nicole Linesishia Z Pund [id number] 08-05-1976 10/14/2013      Progress Note-Follow Up  Subjective  Chief Complaint  Chief Complaint  Patient presents with  . Follow-up  . Injections    tdap    HPI  Patient is a 38 year old [place] female who is in today for followup. She is under great deal of stress as her husband is in the hospital with acute pancreatitis. Her health is otherwise good. No recent illness. Her blood sugars have been well-controlled. No polyuria or polydipsia. She does have ongoing chronic pain in her back in her knees but manages it well most days. No chest pain, palpitations, shortness of breath, GI or GU concerns. Take medications as prescribed.  Past Medical History  Diagnosis Date  . Allergy     seasonal  . Acute low back pain 12/19/2010  . Overweight 10/16/2010  . SNORING 10/16/2010  . UTI'S, HX OF 10/16/2010  . INSOMNIA 10/16/2010  . HIATAL HERNIA WITH REFLUX, HX OF 10/16/2010  . ALLERGIC RHINITIS 10/16/2010  . Hyperlipidemia 04/21/2011  . Elevated BP 04/21/2011  . Sleep apnea 06/07/2011  . Anxiety and depression 06/07/2011  . Amenorrhea 07/11/2011  . Diabetes mellitus 10/30/2011  . Knee pain, left 03/11/2012  . Reflux 04/22/2012  . Acute sinusitis 08/06/2012  . Migraine 03/07/2013    History reviewed. No pertinent past surgical history.  Family History  Problem Relation Age of Onset  . Lupus Mother   . Arthritis Maternal Grandmother   . Scleroderma Maternal Grandmother   . Glaucoma Maternal Grandmother   . Cataracts Maternal Grandmother   . Cancer Maternal Grandfather     prostate  . Coronary artery disease Maternal Grandfather     s/p angioplasty  . Diabetes Maternal Grandfather     Type 2  . Cancer Paternal Grandmother     Breast ca- dx in 3550's  . Thyroid disease Paternal Grandmother   . Diabetes Paternal Grandfather   . Thyroid disease Sister   . Asthma Sister   .  Allergies Brother   . Arthritis Other     History   Social History  . Marital Status: Married    Spouse Name: N/A    Number of Children: N/A  . Years of Education: N/A   Occupational History  . Not on file.   Social History Main Topics  . Smoking status: Current Some Day Smoker -- 0.10 packs/day    Types: Cigars  . Smokeless tobacco: Never Used     Comment: 1 black and milds  . Alcohol Use: Yes     Comment: occasional  . Drug Use: No  . Sexual Activity: Yes    Partners: Male   Other Topics Concern  . Not on file   Social History Narrative  . No narrative on file    Current Outpatient Prescriptions on File Prior to Visit  Medication Sig Dispense Refill  . ALPRAZolam (XANAX) 0.5 MG tablet Take 1 tablet (0.5 mg total) by mouth 3 (three) times daily as needed for sleep or anxiety.  90 tablet  0  . BAYER MICROLET LANCETS lancets Use as directed to test daily and as needed for symptoms  100 each  1  . Blood Glucose Monitoring Suppl (CONTOUR BLOOD GLUCOSE SYSTEM) DEVI Use as directed to test daily and as needed for symptoms  1 Device  0  . cyclobenzaprine (FLEXERIL) 10 MG tablet Take 1  tablet (10 mg total) by mouth 2 (two) times daily as needed for muscle spasms.  60 tablet  1  . fexofenadine (ALLEGRA) 180 MG tablet Take 1 tablet (180 mg total) by mouth daily as needed.  30 tablet  5  . fluticasone (FLONASE) 50 MCG/ACT nasal spray INSTILL 2 SPRAYS IN EACH NOSTRIL DAILY  16 g  3  . glucose blood (BAYER CONTOUR TEST) test strip Use as directed to test daily and as needed for symptoms  100 each  1  . HYDROcodone-acetaminophen (NORCO/VICODIN) 5-325 MG per tablet Take 1 tablet by mouth every 6 (six) hours as needed.  100 tablet  0  . meloxicam (MOBIC) 15 MG tablet Take 1 tablet (15 mg total) by mouth daily.  90 tablet  3  . metFORMIN (GLUCOPHAGE) 500 MG tablet Take 1 tablet (500 mg total) by mouth at bedtime.  30 tablet  5  . montelukast (SINGULAIR) 10 MG tablet Take 1 tablet (10 mg  total) by mouth at bedtime.  30 tablet  3  . Norethindrone Acetate-Ethinyl Estradiol (LOESTRIN 1.5/30, 21,) 1.5-30 MG-MCG tablet Take 1 tablet by mouth daily.      Marland Kitchen omeprazole (PRILOSEC) 20 MG capsule Take 1 capsule (20 mg total) by mouth 2 (two) times daily as needed. reflux  60 capsule  3  . promethazine (PHENERGAN) 25 MG tablet Take 1 tablet (25 mg total) by mouth every 8 (eight) hours as needed for nausea.  20 tablet  1  . ranitidine (ZANTAC) 300 MG tablet TAKE 1 TABLET BY MOUTH AT BEDTIME  30 tablet  0  . Saline 0.65 % (SOLN) SOLN Place 2 Squirts into the nose 2 (two) times daily.  1 Bottle  0  . SUMAtriptan (IMITREX) 50 MG tablet Take 1 tablet (50 mg total) by mouth every 2 (two) hours as needed for migraine (mas 2 tabs in 24 hours).  10 tablet  3  . venlafaxine (EFFEXOR) 50 MG tablet TAKE 1 TABLET BY MOUTH TWICE DAILY  60 tablet  3  . zolpidem (AMBIEN) 10 MG tablet TAKE 1 TABLET BY MOUTH ONCE DAILY AT BEDTIME AS NEEDED  30 tablet  1   No current facility-administered medications on file prior to visit.    No Known Allergies  Review of Systems  Review of Systems  Constitutional: Negative for fever and malaise/fatigue.  HENT: Negative for congestion.   Eyes: Negative for discharge.  Respiratory: Negative for shortness of breath.   Cardiovascular: Negative for chest pain, palpitations and leg swelling.  Gastrointestinal: Negative for nausea, abdominal pain and diarrhea.  Genitourinary: Negative for dysuria.  Musculoskeletal: Negative for falls.  Skin: Negative for rash.  Neurological: Negative for loss of consciousness and headaches.  Endo/Heme/Allergies: Negative for polydipsia.  Psychiatric/Behavioral: Negative for depression and suicidal ideas. The patient is not nervous/anxious and does not have insomnia.     Objective  BP 132/88  Pulse 98  Temp(Src) 97.9 F (36.6 C) (Oral)  Ht 5\' 3"  (1.6 m)  Wt 196 lb 1.9 oz (88.959 kg)  BMI 34.75 kg/m2  SpO2 99%  LMP  12/14/2012  Physical Exam  Physical Exam  Constitutional: She is oriented to person, place, and time and well-developed, well-nourished, and in no distress. No distress.  HENT:  Head: Normocephalic and atraumatic.  Eyes: Conjunctivae are normal.  Neck: Neck supple. No thyromegaly present.  Cardiovascular: Normal rate, regular rhythm and normal heart sounds.   No murmur heard. Pulmonary/Chest: Effort normal and breath sounds normal. She has no  wheezes.  Abdominal: She exhibits no distension and no mass.  Musculoskeletal: She exhibits no edema.  Lymphadenopathy:    She has no cervical adenopathy.  Neurological: She is alert and oriented to person, place, and time.  Skin: Skin is warm and dry. No rash noted. She is not diaphoretic.  Psychiatric: Memory, affect and judgment normal.    Lab Results  Component Value Date   TSH 0.801 03/07/2013   Lab Results  Component Value Date   WBC 5.4 03/07/2013   HGB 13.2 03/07/2013   HCT 39.3 03/07/2013   MCV 84.5 03/07/2013   PLT 219 03/07/2013   Lab Results  Component Value Date   CREATININE 0.68 03/07/2013   BUN 11 03/07/2013   NA 138 03/07/2013   K 3.9 03/07/2013   CL 104 03/07/2013   CO2 23 03/07/2013   Lab Results  Component Value Date   ALT 25 03/07/2013   AST 17 03/07/2013   ALKPHOS 102 03/07/2013   BILITOT 0.3 03/07/2013   Lab Results  Component Value Date   CHOL 198 03/07/2013   Lab Results  Component Value Date   HDL 61 03/07/2013   Lab Results  Component Value Date   LDLCALC 108* 03/07/2013   Lab Results  Component Value Date   TRIG 145 03/07/2013   Lab Results  Component Value Date   CHOLHDL 3.2 03/07/2013     Assessment & Plan  Elevated BP Adequate control no changes  Knee pain, left Allowed refill on norco which she uses sparingly  Diabetes mellitus Well controlled continue current meds, monitor  Hyperlipidemia Improved, minimize trans and saturated fats and simple carbs. Increase  exercise  Amenorrhea fsh and lh suggestive of post menopause.

## 2013-10-14 NOTE — Patient Instructions (Signed)

## 2013-10-14 NOTE — Progress Notes (Signed)
Pre visit review using our clinic review tool, if applicable. No additional management support is needed unless otherwise documented below in the visit note. 

## 2013-10-14 NOTE — Telephone Encounter (Signed)
Lab order week of 01-02-2014 Return for annual exam w/gyn, lipid, renal, cbc, tsh, hepatic, hgba1c

## 2013-10-15 LAB — HEMOGLOBIN A1C
Hgb A1c MFr Bld: 6.8 % — ABNORMAL HIGH (ref <5.7)
MEAN PLASMA GLUCOSE: 148 mg/dL — AB (ref <117)

## 2013-10-15 LAB — LUTEINIZING HORMONE: LH: 52 m[IU]/mL

## 2013-10-15 LAB — FOLLICLE STIMULATING HORMONE: FSH: 69.2 m[IU]/mL

## 2013-10-15 LAB — TSH: TSH: 0.698 u[IU]/mL (ref 0.350–4.500)

## 2013-10-16 NOTE — Assessment & Plan Note (Signed)
Adequate control no changes 

## 2013-10-16 NOTE — Assessment & Plan Note (Signed)
fsh and lh suggestive of post menopause.

## 2013-10-16 NOTE — Assessment & Plan Note (Signed)
Well controlled continue current meds, monitor

## 2013-10-16 NOTE — Assessment & Plan Note (Signed)
Improved, minimize trans and saturated fats and simple carbs. Increase exercise

## 2013-10-16 NOTE — Assessment & Plan Note (Signed)
Allowed refill on norco which she uses sparingly

## 2013-10-17 ENCOUNTER — Telehealth: Payer: Self-pay

## 2013-10-17 NOTE — Telephone Encounter (Signed)
Relevant patient education assigned to patient using Emmi. ° °

## 2013-11-09 ENCOUNTER — Other Ambulatory Visit: Payer: Self-pay | Admitting: Family Medicine

## 2013-11-09 ENCOUNTER — Telehealth: Payer: Self-pay | Admitting: Family Medicine

## 2013-11-09 NOTE — Telephone Encounter (Signed)
Patient is requesting a refill of metformin [facility]  [facility] [id number] - [place], [place] - [address] [facility] [address]   Patient states that she is out of this medication.

## 2013-11-10 NOTE — Telephone Encounter (Signed)
Please look at previous requests. Already done

## 2013-12-12 ENCOUNTER — Other Ambulatory Visit: Payer: Self-pay | Admitting: Family Medicine

## 2014-01-10 ENCOUNTER — Other Ambulatory Visit (HOSPITAL_COMMUNITY)
Admission: RE | Admit: 2014-01-10 | Discharge: 2014-01-10 | Disposition: A | Discharge status: Home / Self Care | Payer: BC Managed Care – PPO | Source: Ambulatory Visit | Attending: Family Medicine | Admitting: Family Medicine

## 2014-01-10 ENCOUNTER — Ambulatory Visit (INDEPENDENT_AMBULATORY_CARE_PROVIDER_SITE_OTHER): Payer: BC Managed Care – PPO | Admitting: Family Medicine

## 2014-01-10 ENCOUNTER — Encounter: Payer: Self-pay | Admitting: Family Medicine

## 2014-01-10 ENCOUNTER — Telehealth: Payer: Self-pay | Admitting: Family Medicine

## 2014-01-10 VITALS — BP 122/82 | HR 78 | Temp 98.1°F | Ht 63.0 in | Wt 199.1 lb

## 2014-01-10 DIAGNOSIS — N760 Acute vaginitis: Secondary | ICD-10-CM

## 2014-01-10 DIAGNOSIS — M25532 Pain in left wrist: Secondary | ICD-10-CM

## 2014-01-10 DIAGNOSIS — E785 Hyperlipidemia, unspecified: Secondary | ICD-10-CM

## 2014-01-10 DIAGNOSIS — G5600 Carpal tunnel syndrome, unspecified upper limb: Secondary | ICD-10-CM

## 2014-01-10 DIAGNOSIS — Z0000 Encounter for general adult medical examination without abnormal findings: Secondary | ICD-10-CM

## 2014-01-10 DIAGNOSIS — E119 Type 2 diabetes mellitus without complications: Secondary | ICD-10-CM

## 2014-01-10 DIAGNOSIS — M25562 Pain in left knee: Secondary | ICD-10-CM

## 2014-01-10 DIAGNOSIS — R030 Elevated blood-pressure reading, without diagnosis of hypertension: Secondary | ICD-10-CM

## 2014-01-10 DIAGNOSIS — Z8719 Personal history of other diseases of the digestive system: Secondary | ICD-10-CM

## 2014-01-10 DIAGNOSIS — M25539 Pain in unspecified wrist: Secondary | ICD-10-CM

## 2014-01-10 DIAGNOSIS — IMO0001 Reserved for inherently not codable concepts without codable children: Secondary | ICD-10-CM

## 2014-01-10 DIAGNOSIS — Z01419 Encounter for gynecological examination (general) (routine) without abnormal findings: Secondary | ICD-10-CM | POA: Insufficient documentation

## 2014-01-10 DIAGNOSIS — Z124 Encounter for screening for malignant neoplasm of cervix: Secondary | ICD-10-CM

## 2014-01-10 DIAGNOSIS — E663 Overweight: Secondary | ICD-10-CM

## 2014-01-10 HISTORY — DX: Encounter for screening for malignant neoplasm of cervix: Z12.4

## 2014-01-10 MED ORDER — METFORMIN HCL 500 MG PO TABS: 500.0000 mg | ORAL_TABLET | Freq: Three times a day (TID) | ORAL | Status: DC

## 2014-01-10 MED ORDER — HYDROCODONE-ACETAMINOPHEN 5-325 MG PO TABS: 1.0000 | ORAL_TABLET | Freq: Four times a day (QID) | ORAL | Status: DC | PRN

## 2014-01-10 MED ORDER — GLUCOSE BLOOD VI STRP: ORAL_STRIP | Status: DC

## 2014-01-10 NOTE — Progress Notes (Signed)
Pre visit review using our clinic review tool, if applicable. No additional management support is needed unless otherwise documented below in the visit note. 

## 2014-01-10 NOTE — Patient Instructions (Signed)
DASH Diet  The DASH diet stands for "Dietary Approaches to Stop Hypertension." It is a healthy eating plan that has been shown to reduce high blood pressure (hypertension) in as little as 14 days, while also possibly providing other significant health benefits. These other health benefits include reducing the risk of breast cancer after menopause and reducing the risk of type 2 diabetes, heart disease, colon cancer, and stroke. Health benefits also include weight loss and slowing kidney failure in patients with chronic kidney disease.   DIET GUIDELINES  · Limit salt (sodium). Your diet should contain less than 1500 mg of sodium daily.  · Limit refined or processed carbohydrates. Your diet should include mostly whole grains. Desserts and added sugars should be used sparingly.  · Include small amounts of heart-healthy fats. These types of fats include nuts, oils, and tub margarine. Limit saturated and trans fats. These fats have been shown to be harmful in the body.  CHOOSING FOODS   The following food groups are based on a 2000 calorie diet. See your Registered Dietitian for individual calorie needs.  Grains and Grain Products (6 to 8 servings daily)  · Eat More Often: Whole-wheat bread, brown rice, whole-grain or wheat pasta, quinoa, popcorn without added fat or salt (air popped).  · Eat Less Often: White bread, white pasta, white rice, cornbread.  Vegetables (4 to 5 servings daily)  · Eat More Often: Fresh, frozen, and canned vegetables. Vegetables may be raw, steamed, roasted, or grilled with a minimal amount of fat.  · Eat Less Often/Avoid: Creamed or fried vegetables. Vegetables in a cheese sauce.  Fruit (4 to 5 servings daily)  · Eat More Often: All fresh, canned (in natural juice), or frozen fruits. Dried fruits without added sugar. One hundred percent fruit juice (½ cup [237 mL] daily).  · Eat Less Often: Dried fruits with added sugar. Canned fruit in light or heavy syrup.  Lean Meats, Fish, and Poultry (2  servings or less daily. One serving is 3 to 4 oz [85-114 g]).  · Eat More Often: Ninety percent or leaner ground beef, tenderloin, sirloin. Round cuts of beef, chicken breast, turkey breast. All fish. Grill, bake, or broil your meat. Nothing should be fried.  · Eat Less Often/Avoid: Fatty cuts of meat, turkey, or chicken leg, thigh, or wing. Fried cuts of meat or fish.  Dairy (2 to 3 servings)  · Eat More Often: Low-fat or fat-free milk, low-fat plain or light yogurt, reduced-fat or part-skim cheese.  · Eat Less Often/Avoid: Milk (whole, 2%). Whole milk yogurt. Full-fat cheeses.  Nuts, Seeds, and Legumes (4 to 5 servings per week)  · Eat More Often: All without added salt.  · Eat Less Often/Avoid: Salted nuts and seeds, canned beans with added salt.  Fats and Sweets (limited)  · Eat More Often: Vegetable oils, tub margarines without trans fats, sugar-free gelatin. Mayonnaise and salad dressings.  · Eat Less Often/Avoid: Coconut oils, palm oils, butter, stick margarine, cream, half and half, cookies, candy, pie.  FOR MORE INFORMATION  The Dash Diet Eating Plan: www.dashdiet.org  Document Released: 07/17/2011 Document Revised: 10/20/2011 Document Reviewed: 07/17/2011  Exit[employer]® Patient Information ©2014 [employer].

## 2014-01-10 NOTE — Telephone Encounter (Signed)
Return in about 3 months (around 04/12/2014) for lipid, renal, cbc, tsh, hepatic, hgba1c prior. Diagnostic follow-up:    [place] [facility]

## 2014-01-10 NOTE — Assessment & Plan Note (Signed)
Pap today, no concerns on exam. Post menopause

## 2014-01-10 NOTE — Progress Notes (Signed)
Patient ID: Nicole Ball, female   DOB: [birth year], 38 y.o.   MRN: [id number] Nicole Ball [id number] 12/29/1975 01/10/2014      Progress Note-Follow Up  Subjective  Chief Complaint  Chief Complaint  Patient presents with  . Annual Exam    physical  . Gynecologic Exam    pap    HPI  Patient is a 38 year old female in today for routine medical care. Patient is here today for annual exam. Generally doing well but is having some increasing left wrist pain. Has had trouble with carpal tunnel in the past and recently has flared up. Denies polyuria or polydipsia and reports sugars have been fairly well-controlled. No GYN complaints. No recent illness.  Denies CP/palp/SOB/HA/congestion/fevers/GI or GU c/o. Taking meds as prescribed  Past Medical History  Diagnosis Date  . Allergy     seasonal  . Acute low back pain 12/19/2010  . Overweight 10/16/2010  . SNORING 10/16/2010  . UTI'S, HX OF 10/16/2010  . INSOMNIA 10/16/2010  . HIATAL HERNIA WITH REFLUX, HX OF 10/16/2010  . ALLERGIC RHINITIS 10/16/2010  . Hyperlipidemia 04/21/2011  . Elevated BP 04/21/2011  . Sleep apnea 06/07/2011  . Anxiety and depression 06/07/2011  . Amenorrhea 07/11/2011  . Diabetes mellitus 10/30/2011  . Knee pain, left 03/11/2012  . Reflux 04/22/2012  . Acute sinusitis 08/06/2012  . Migraine 03/07/2013  . Cervical cancer screening 01/10/2014    Menarche at 8 Regular and moderate flow, became irregular until OCPs  history of abnormal pap in past, repeat was normal Last pap roughly 3 years ago G0P0, s/p No history of abnormal MGM, no h/o MGM  No concerns today no gyn surgeries LMP May 2013    History reviewed. No pertinent past surgical history.  Family History  Problem Relation Age of Onset  . Lupus Mother   . Arthritis Maternal Grandmother   . Scleroderma Maternal Grandmother   . Glaucoma Maternal Grandmother   . Cataracts Maternal Grandmother   . Cancer Maternal Grandfather     prostate  . Coronary artery  disease Maternal Grandfather     s/p angioplasty  . Diabetes Maternal Grandfather     Type 2  . Cancer Paternal Grandmother     Breast ca- dx in 82's  . Thyroid disease Paternal Grandmother   . Diabetes Paternal Grandfather   . Thyroid disease Sister   . Asthma Sister   . Allergies Brother   . Arthritis Other     History   Social History  . Marital Status: Married    Spouse Name: N/A    Number of Children: N/A  . Years of Education: N/A   Occupational History  . Not on file.   Social History Main Topics  . Smoking status: Current Some Day Smoker -- 0.10 packs/day    Types: Cigars  . Smokeless tobacco: Never Used     Comment: 1 black and milds  . Alcohol Use: Yes     Comment: occasional  . Drug Use: No  . Sexual Activity: Yes    Partners: Male   Other Topics Concern  . Not on file   Social History Narrative  . No narrative on file    Current Outpatient Prescriptions on File Prior to Visit  Medication Sig Dispense Refill  . ALPRAZolam (XANAX) 0.5 MG tablet Take 1 tablet (0.5 mg total) by mouth 3 (three) times daily as needed for sleep or anxiety.  90 tablet  2  . BAYER MICROLET LANCETS  lancets Use as directed to test daily and as needed for symptoms  100 each  1  . Blood Glucose Monitoring Suppl (CONTOUR BLOOD GLUCOSE SYSTEM) DEVI Use as directed to test daily and as needed for symptoms  1 Device  0  . cyclobenzaprine (FLEXERIL) 10 MG tablet Take 1 tablet (10 mg total) by mouth 2 (two) times daily as needed for muscle spasms.  60 tablet  1  . fexofenadine (ALLEGRA) 180 MG tablet Take 1 tablet (180 mg total) by mouth daily as needed.  30 tablet  5  . fluticasone (FLONASE) 50 MCG/ACT nasal spray INSTILL 2 SPRAYS IN EACH NOSTRIL DAILY  16 g  3  . glucose blood (BAYER CONTOUR TEST) test strip Use as directed to test daily and as needed for symptoms  100 each  1  . HYDROcodone-acetaminophen (NORCO/VICODIN) 5-325 MG per tablet Take 1 tablet by mouth every 6 (six) hours as  needed.  100 tablet  0  . meloxicam (MOBIC) 15 MG tablet Take 1 tablet (15 mg total) by mouth daily.  90 tablet  3  . metFORMIN (GLUCOPHAGE) 500 MG tablet TAKE 1 TABLET AT BEDTIME  30 tablet  4  . montelukast (SINGULAIR) 10 MG tablet Take 1 tablet (10 mg total) by mouth at bedtime.  30 tablet  3  . Norethindrone Acetate-Ethinyl Estradiol (LOESTRIN 1.5/30, 21,) 1.5-30 MG-MCG tablet Take 1 tablet by mouth daily.      Marland Kitchen omeprazole (PRILOSEC) 20 MG capsule Take 1 capsule (20 mg total) by mouth 2 (two) times daily as needed. reflux  60 capsule  3  . promethazine (PHENERGAN) 25 MG tablet Take 1 tablet (25 mg total) by mouth every 8 (eight) hours as needed for nausea or vomiting.  20 tablet  2  . ranitidine (ZANTAC) 300 MG tablet TAKE 1 TABLET AT BEDTIME  30 tablet  1  . Saline 0.65 % (SOLN) SOLN Place 2 Squirts into the nose 2 (two) times daily.  1 Bottle  0  . SUMAtriptan (IMITREX) 50 MG tablet TAKE 1 TABLET BY MOUTH EVERY 2 HOURS AS NEEDED FOR MIGRAINE - NO MORE THAN 2 PER 24 HOURS  10 tablet  2  . venlafaxine (EFFEXOR) 50 MG tablet Take 1 tablet (50 mg total) by mouth 3 (three) times daily with meals.  90 tablet  3  . zolpidem (AMBIEN) 10 MG tablet TAKE 1 TABLET BY MOUTH ONCE DAILY AT BEDTIME AS NEEDED  30 tablet  5   No current facility-administered medications on file prior to visit.    No Known Allergies  Review of Systems  Review of Systems  Constitutional: Negative for fever and malaise/fatigue.  HENT: Negative for congestion.   Eyes: Negative for discharge.  Respiratory: Negative for shortness of breath.   Cardiovascular: Negative for chest pain, palpitations and leg swelling.  Gastrointestinal: Negative for nausea, abdominal pain and diarrhea.  Genitourinary: Negative for dysuria.  Musculoskeletal: Negative for falls.  Skin: Negative for rash.  Neurological: Negative for loss of consciousness and headaches.  Endo/Heme/Allergies: Negative for polydipsia.  Psychiatric/Behavioral:  Negative for depression and suicidal ideas. The patient is not nervous/anxious and does not have insomnia.     Objective  BP 122/82  Pulse 78  Temp(Src) 98.1 F (36.7 C) (Oral)  Ht 5\' 3"  (1.6 m)  Wt 199 lb 1.3 oz (90.302 kg)  BMI 35.27 kg/m2  SpO2 97%  LMP 12/10/2012  Physical Exam  Physical Exam  Constitutional: She is oriented to person, place, and time  and well-developed, well-nourished, and in no distress. No distress.  HENT:  Head: Normocephalic and atraumatic.  Eyes: Conjunctivae are normal.  Neck: Neck supple. No thyromegaly present.  Cardiovascular: Normal rate, regular rhythm and normal heart sounds.   No murmur heard. Pulmonary/Chest: Effort normal and breath sounds normal. She has no wheezes.  Abdominal: She exhibits no distension and no mass.  Musculoskeletal: She exhibits no edema.  Lymphadenopathy:    She has no cervical adenopathy.  Neurological: She is alert and oriented to person, place, and time.  Skin: Skin is warm and dry. No rash noted. She is not diaphoretic.  Psychiatric: Memory, affect and judgment normal.    Lab Results  Component Value Date   TSH 0.698 10/14/2013   Lab Results  Component Value Date   WBC 5.1 10/14/2013   HGB 12.6 10/14/2013   HCT 37.2 10/14/2013   MCV 84.0 10/14/2013   PLT 219 10/14/2013   Lab Results  Component Value Date   CREATININE 0.68 10/14/2013   BUN 11 10/14/2013   NA 135 10/14/2013   K 3.8 10/14/2013   CL 101 10/14/2013   CO2 22 10/14/2013   Lab Results  Component Value Date   ALT 15 10/14/2013   AST 13 10/14/2013   ALKPHOS 123* 10/14/2013   BILITOT 0.3 10/14/2013   Lab Results  Component Value Date   CHOL 186 10/14/2013   Lab Results  Component Value Date   HDL 77 10/14/2013   Lab Results  Component Value Date   LDLCALC 85 10/14/2013   Lab Results  Component Value Date   TRIG 122 10/14/2013   Lab Results  Component Value Date   CHOLHDL 2.4 10/14/2013     Assessment & Plan  Cervical cancer screening Pap today, no  concerns on exam. Post menopause  Diabetes mellitus hgba1c acceptable, minimize simple carbs. Increase exercise as tolerated. Continue current meds. Agrees to repeat A1C prior to next visit.  HIATAL HERNIA WITH REFLUX, HX OF Avoid offending foods, start probiotics. Do not eat large meals in late evening and consider raising head of bed.   Elevated BP Well controlled, no changes to meds. Encouraged heart healthy diet such as the DASH diet and exercise as tolerated.   OVERWEIGHT Encouraged DASH diet, decrease po intake and increase exercise as tolerated. Needs 7-8 hours of sleep nightly. Avoid trans fats, eat small, frequent meals every 4-5 hours with lean proteins, complex carbs and healthy fats. Minimize simple carbs, GMO foods.  CTS (carpal tunnel syndrome) Left wrist pain, referred to ortho for further treatment. Ice, splint and topical treatment  Hyperlipidemia Encouraged heart healthy diet, increase exercise, avoid trans fats, consider a krill oil cap daily  Preventative health care Patient encouraged to maintain heart healthy diet, regular exercise, adequate sleep. Consider daily probiotics. Take medications as prescribed. Had her pap today, proceed with baseline MGM

## 2014-01-11 ENCOUNTER — Telehealth: Payer: Self-pay | Admitting: Family Medicine

## 2014-01-11 NOTE — Telephone Encounter (Signed)
Relevant patient education assigned to patient using Emmi. ° °

## 2014-01-12 ENCOUNTER — Telehealth: Payer: Self-pay

## 2014-01-12 DIAGNOSIS — IMO0001 Reserved for inherently not codable concepts without codable children: Secondary | ICD-10-CM

## 2014-01-12 DIAGNOSIS — E119 Type 2 diabetes mellitus without complications: Secondary | ICD-10-CM

## 2014-01-12 DIAGNOSIS — R030 Elevated blood-pressure reading, without diagnosis of hypertension: Principal | ICD-10-CM

## 2014-01-12 DIAGNOSIS — E785 Hyperlipidemia, unspecified: Secondary | ICD-10-CM

## 2014-01-12 NOTE — Telephone Encounter (Signed)
Message copied by Court Joy on Thu Jan 12, 2014  7:14 AM ------      Message from: Danise Edge A      Created: Wed Jan 11, 2014  5:43 PM       Patient is needs these labs in about 2 weeks and again rpior to appt in roughly 4 months. Lipid, renal, cbc, tsh, hepatic, hgba1c      ----- Message -----         From: Dedra Skeens         Sent: 01/10/2014   2:43 PM           To: [name], MD            Patient was confused as to when she is due back for labwork. She knows that she needs to come in prior to her October visit but she thought that she was supposed to come back in two weeks for labs?        ------

## 2014-01-12 NOTE — Telephone Encounter (Signed)
Can you please call and inform pt that she needs to return in 2 weeks for labs

## 2014-01-13 ENCOUNTER — Other Ambulatory Visit: Payer: Self-pay | Admitting: Family Medicine

## 2014-01-13 LAB — CYTOLOGY - PAP

## 2014-01-15 ENCOUNTER — Encounter: Payer: Self-pay | Admitting: Family Medicine

## 2014-01-15 DIAGNOSIS — G5600 Carpal tunnel syndrome, unspecified upper limb: Secondary | ICD-10-CM

## 2014-01-15 DIAGNOSIS — Z0000 Encounter for general adult medical examination without abnormal findings: Secondary | ICD-10-CM | POA: Insufficient documentation

## 2014-01-15 HISTORY — DX: Carpal tunnel syndrome, unspecified upper limb: G56.00

## 2014-01-15 NOTE — Assessment & Plan Note (Signed)
Encouraged DASH diet, decrease po intake and increase exercise as tolerated. Needs 7-8 hours of sleep nightly. Avoid trans fats, eat small, frequent meals every 4-5 hours with lean proteins, complex carbs and healthy fats. Minimize simple carbs, GMO foods. 

## 2014-01-15 NOTE — Assessment & Plan Note (Signed)
Encouraged heart healthy diet, increase exercise, avoid trans fats, consider a krill oil cap daily 

## 2014-01-15 NOTE — Assessment & Plan Note (Signed)
Avoid offending foods, start probiotics. Do not eat large meals in late evening and consider raising head of bed.  

## 2014-01-15 NOTE — Assessment & Plan Note (Signed)
Left wrist pain, referred to ortho for further treatment. Ice, splint and topical treatment

## 2014-01-15 NOTE — Assessment & Plan Note (Signed)
hgba1c acceptable, minimize simple carbs. Increase exercise as tolerated. Continue current meds. Agrees to repeat A1C prior to next visit.

## 2014-01-15 NOTE — Assessment & Plan Note (Signed)
Patient encouraged to maintain heart healthy diet, regular exercise, adequate sleep. Consider daily probiotics. Take medications as prescribed. Had her pap today, proceed with baseline MGM

## 2014-01-15 NOTE — Assessment & Plan Note (Signed)
Well controlled, no changes to meds. Encouraged heart healthy diet such as the DASH diet and exercise as tolerated.  °

## 2014-01-18 MED ORDER — METRONIDAZOLE 500 MG PO TABS: 500.0000 mg | ORAL_TABLET | Freq: Two times a day (BID) | ORAL | Status: DC

## 2014-01-18 NOTE — Addendum Note (Signed)
Addended by: Court Joy on: 01/18/2014 09:30 AM   Modules accepted: Orders

## 2014-02-17 ENCOUNTER — Other Ambulatory Visit: Payer: Self-pay | Admitting: Family Medicine

## 2014-02-20 ENCOUNTER — Other Ambulatory Visit: Payer: Self-pay

## 2014-02-20 ENCOUNTER — Other Ambulatory Visit: Payer: Self-pay | Admitting: Family Medicine

## 2014-02-20 DIAGNOSIS — Z1231 Encounter for screening mammogram for malignant neoplasm of breast: Secondary | ICD-10-CM

## 2014-03-03 ENCOUNTER — Other Ambulatory Visit: Payer: Self-pay | Admitting: Family Medicine

## 2014-03-03 DIAGNOSIS — G4700 Insomnia, unspecified: Secondary | ICD-10-CM

## 2014-03-03 DIAGNOSIS — M25562 Pain in left knee: Secondary | ICD-10-CM

## 2014-03-03 MED ORDER — HYDROCODONE-ACETAMINOPHEN 5-325 MG PO TABS: 1.0000 | ORAL_TABLET | Freq: Four times a day (QID) | ORAL | Status: DC | PRN

## 2014-03-03 NOTE — Telephone Encounter (Signed)
Ambien was last refilled on 10-14-13 quantity 30 with 5 refills- TOO EARLY  Please advise Hydrocodone RX- last RX was done on 01-10-14 quantity 100 with 0 refills

## 2014-03-03 NOTE — Telephone Encounter (Signed)
Informed patient of medication refill °

## 2014-03-03 NOTE — Telephone Encounter (Signed)
Left message for patient to return my call.

## 2014-03-03 NOTE — Telephone Encounter (Signed)
Patient is requesting refills of ambien and zolpidem   She is also requesting a new rx of vicoden

## 2014-03-03 NOTE — Telephone Encounter (Signed)
Please inform pt that Hydrocodone RX is available but Ambien RX is to early.

## 2014-03-27 ENCOUNTER — Inpatient Hospital Stay: Admission: RE | Admit: 2014-03-27 | Payer: BC Managed Care – PPO | Source: Ambulatory Visit

## 2014-03-27 ENCOUNTER — Other Ambulatory Visit: Payer: Self-pay | Admitting: Family Medicine

## 2014-04-04 ENCOUNTER — Telehealth: Payer: Self-pay | Admitting: Family Medicine

## 2014-04-04 DIAGNOSIS — F419 Anxiety disorder, unspecified: Secondary | ICD-10-CM

## 2014-04-04 MED ORDER — ALPRAZOLAM 0.5 MG PO TABS: 0.5000 mg | ORAL_TABLET | Freq: Three times a day (TID) | ORAL | Status: DC | PRN

## 2014-04-04 NOTE — Telephone Encounter (Signed)
Refill-alprazolam  [facility]

## 2014-04-04 NOTE — Telephone Encounter (Signed)
rx printed for md to sign and fax  Last rx was done on 10-14-13 quantity 90 with 2 refills

## 2014-04-14 ENCOUNTER — Other Ambulatory Visit: Payer: Self-pay | Admitting: General Practice

## 2014-04-14 ENCOUNTER — Telehealth: Payer: Self-pay | Admitting: Family Medicine

## 2014-04-14 DIAGNOSIS — M25562 Pain in left knee: Secondary | ICD-10-CM

## 2014-04-14 MED ORDER — HYDROCODONE-ACETAMINOPHEN 5-325 MG PO TABS: 1.0000 | ORAL_TABLET | Freq: Four times a day (QID) | ORAL | Status: DC | PRN

## 2014-04-14 NOTE — Telephone Encounter (Signed)
RX printed for md to sign  Last RX was done on 03-03-14 quantity 10 with 0 refills

## 2014-04-14 NOTE — Telephone Encounter (Signed)
Caller name: Gianne  Call back number:[phone]   Reason for call:  Pt is requesting a refill on Rx HYDROcodone-acetaminophen (NORCO/VICODIN) 5-325 MG per   Note: pt is stating she is having migraines now and has been taking more than usual.

## 2014-05-01 ENCOUNTER — Other Ambulatory Visit: Payer: Self-pay

## 2014-05-01 DIAGNOSIS — G4700 Insomnia, unspecified: Secondary | ICD-10-CM

## 2014-05-01 NOTE — Telephone Encounter (Signed)
Correct me if i'm wrong? I feel that pt should of got this last refill on 04-16-14? Pt shouldn't need until 05-16-14?

## 2014-05-02 MED ORDER — ZOLPIDEM TARTRATE 10 MG PO TABS: ORAL_TABLET | ORAL | Status: DC

## 2014-05-02 NOTE — Telephone Encounter (Signed)
rx faxed

## 2014-05-12 ENCOUNTER — Emergency Department (HOSPITAL_BASED_OUTPATIENT_CLINIC_OR_DEPARTMENT_OTHER)
Admission: EM | Admit: 2014-05-12 | Discharge: 2014-05-12 | Disposition: A | Discharge status: Home / Self Care | Payer: BC Managed Care – PPO | Attending: Emergency Medicine | Admitting: Emergency Medicine

## 2014-05-12 ENCOUNTER — Other Ambulatory Visit: Payer: Self-pay | Admitting: Family Medicine

## 2014-05-12 ENCOUNTER — Encounter (HOSPITAL_BASED_OUTPATIENT_CLINIC_OR_DEPARTMENT_OTHER): Payer: Self-pay | Admitting: Emergency Medicine

## 2014-05-12 DIAGNOSIS — Z8639 Personal history of other endocrine, nutritional and metabolic disease: Secondary | ICD-10-CM | POA: Insufficient documentation

## 2014-05-12 DIAGNOSIS — Y9389 Activity, other specified: Secondary | ICD-10-CM | POA: Insufficient documentation

## 2014-05-12 DIAGNOSIS — Z79899 Other long term (current) drug therapy: Secondary | ICD-10-CM | POA: Diagnosis not present

## 2014-05-12 DIAGNOSIS — S50861A Insect bite (nonvenomous) of right forearm, initial encounter: Secondary | ICD-10-CM | POA: Insufficient documentation

## 2014-05-12 DIAGNOSIS — Z720 Tobacco use: Secondary | ICD-10-CM | POA: Diagnosis not present

## 2014-05-12 DIAGNOSIS — Z8709 Personal history of other diseases of the respiratory system: Secondary | ICD-10-CM | POA: Insufficient documentation

## 2014-05-12 DIAGNOSIS — Z791 Long term (current) use of non-steroidal anti-inflammatories (NSAID): Secondary | ICD-10-CM | POA: Diagnosis not present

## 2014-05-12 DIAGNOSIS — W57XXXA Bitten or stung by nonvenomous insect and other nonvenomous arthropods, initial encounter: Secondary | ICD-10-CM | POA: Insufficient documentation

## 2014-05-12 DIAGNOSIS — K219 Gastro-esophageal reflux disease without esophagitis: Secondary | ICD-10-CM | POA: Insufficient documentation

## 2014-05-12 DIAGNOSIS — Z7951 Long term (current) use of inhaled steroids: Secondary | ICD-10-CM | POA: Insufficient documentation

## 2014-05-12 DIAGNOSIS — E119 Type 2 diabetes mellitus without complications: Secondary | ICD-10-CM | POA: Diagnosis not present

## 2014-05-12 DIAGNOSIS — E663 Overweight: Secondary | ICD-10-CM | POA: Insufficient documentation

## 2014-05-12 DIAGNOSIS — F418 Other specified anxiety disorders: Secondary | ICD-10-CM | POA: Diagnosis not present

## 2014-05-12 DIAGNOSIS — Y9289 Other specified places as the place of occurrence of the external cause: Secondary | ICD-10-CM | POA: Insufficient documentation

## 2014-05-12 DIAGNOSIS — G43909 Migraine, unspecified, not intractable, without status migrainosus: Secondary | ICD-10-CM | POA: Diagnosis not present

## 2014-05-12 DIAGNOSIS — Z8742 Personal history of other diseases of the female genital tract: Secondary | ICD-10-CM | POA: Diagnosis not present

## 2014-05-12 DIAGNOSIS — Z8541 Personal history of malignant neoplasm of cervix uteri: Secondary | ICD-10-CM | POA: Insufficient documentation

## 2014-05-12 MED ORDER — IBUPROFEN 800 MG PO TABS: 800.0000 mg | ORAL_TABLET | Freq: Three times a day (TID) | ORAL | Status: DC

## 2014-05-12 NOTE — ED Notes (Signed)
Pt states she felt something bit her right forearm yesterday-now is red/swelling

## 2014-05-12 NOTE — Discharge Instructions (Signed)

## 2014-05-12 NOTE — ED Notes (Signed)
Pt was advised to keep a check on her BP this week and take in to her PCP appt next week -agreed

## 2014-05-12 NOTE — ED Provider Notes (Signed)
CSN: [id number]     Arrival date & time 05/12/14  1126 History   First MD Initiated Contact with Patient 05/12/14 1202     Chief Complaint  Patient presents with  . Cellulitis     (Consider location/radiation/quality/duration/timing/severity/associated sxs/prior Treatment) Patient is a 38 y.o. female presenting with extremity pain. The history is provided by the patient. No language interpreter was used.  Extremity Pain This is a new problem. The current episode started yesterday. The problem occurs constantly. The problem has been gradually worsening. Pertinent negatives include no abdominal pain, chest pain, fever, nausea, numbness or vomiting. Associated symptoms comments: Pain and swelling to posterior right forearm since yesterday. She was sitting indoors and felt something sting her and today noticed a large area of redness and swelling. No drainage. No nausea, vomiting, other pain..    Past Medical History  Diagnosis Date  . Allergy     seasonal  . Acute low back pain 12/19/2010  . Overweight(278.02) 10/16/2010  . SNORING 10/16/2010  . UTI'S, HX OF 10/16/2010  . INSOMNIA 10/16/2010  . HIATAL HERNIA WITH REFLUX, HX OF 10/16/2010  . ALLERGIC RHINITIS 10/16/2010  . Hyperlipidemia 04/21/2011  . Elevated BP 04/21/2011  . Sleep apnea 06/07/2011  . Anxiety and depression 06/07/2011  . Amenorrhea 07/11/2011  . Diabetes mellitus 10/30/2011  . Knee pain, left 03/11/2012  . Reflux 04/22/2012  . Acute sinusitis 08/06/2012  . Migraine 03/07/2013  . Cervical cancer screening 01/10/2014    Menarche at 8 Regular and moderate flow, became irregular until OCPs  history of abnormal pap in past, repeat was normal Last pap roughly 3 years ago G0P0, s/p No history of abnormal MGM, no h/o MGM  No concerns today no gyn surgeries LMP May 2013  . CTS (carpal tunnel syndrome) 01/15/2014  . Preventative health care 01/15/2014   History reviewed. No pertinent past surgical history. Family History  Problem Relation Age  of Onset  . Lupus Mother   . Arthritis Maternal Grandmother   . Scleroderma Maternal Grandmother   . Glaucoma Maternal Grandmother   . Cataracts Maternal Grandmother   . Cancer Maternal Grandfather     prostate  . Coronary artery disease Maternal Grandfather     s/p angioplasty  . Diabetes Maternal Grandfather     Type 2  . Cancer Paternal Grandmother     Breast ca- dx in 450's  . Thyroid disease Paternal Grandmother   . Diabetes Paternal Grandfather   . Thyroid disease Sister   . Asthma Sister   . Allergies Brother   . Arthritis Other    History  Substance Use Topics  . Smoking status: Current Some Day Smoker -- 0.10 packs/day    Types: Cigars  . Smokeless tobacco: Never Used     Comment: 1 black and milds  . Alcohol Use: Yes     Comment: twice/year   OB History   Grav Para Term Preterm Abortions TAB SAB Ect Mult Living                 Review of Systems  Constitutional: Negative for fever.  Respiratory: Negative for shortness of breath.   Cardiovascular: Negative for chest pain.  Gastrointestinal: Negative for nausea, vomiting and abdominal pain.  Musculoskeletal:       See HPI.  Skin:       See HPI.  Neurological: Negative for numbness.      Allergies  Review of patient's allergies indicates no known allergies.  Home Medications  Prior to Admission medications   Medication Sig Start Date End Date Taking? Authorizing Provider  ALPRAZolam Prudy Feeler) 0.5 MG tablet Take 1 tablet (0.5 mg total) by mouth 3 (three) times daily as needed for sleep or anxiety. 04/04/14   [name], MD  BAYER MICROLET LANCETS lancets Use as directed to test daily and as needed for symptoms 10/14/13   [name], MD  Blood Glucose Monitoring Suppl (CONTOUR BLOOD GLUCOSE SYSTEM) DEVI Use as directed to test daily and as needed for symptoms 05/20/12   [name], MD  cyclobenzaprine (FLEXERIL) 10 MG tablet Take 1 tablet (10 mg total) by mouth 2 (two) times daily as needed for  muscle spasms. 05/20/13   [name], MD  fexofenadine (ALLEGRA) 180 MG tablet Take 1 tablet (180 mg total) by mouth daily as needed. 10/14/13 11/12/14  [name], MD  fluticasone (FLONASE) 50 MCG/ACT nasal spray INSTILL 2 SPRAYS IN EACH NOSTRIL DAILY    [name], MD  glucose blood (BAYER CONTOUR TEST) test strip Use as directed to test daily and as needed for symptoms 01/10/14   [name], MD  HYDROcodone-acetaminophen (NORCO/VICODIN) 5-325 MG per tablet Take 1 tablet by mouth every 6 (six) hours as needed. 04/14/14   [name], MD  meloxicam (MOBIC) 15 MG tablet TAKE 1 TABLET BY MOUTH DAILY 03/27/14   [name], MD  metFORMIN (GLUCOPHAGE) 500 MG tablet Take 1 tablet (500 mg total) by mouth 3 (three) times daily. 01/10/14   [name], MD  metroNIDAZOLE (FLAGYL) 500 MG tablet Take 1 tablet (500 mg total) by mouth 2 (two) times daily. 01/18/14   [name], MD  montelukast (SINGULAIR) 10 MG tablet TAKE 1 TABLET AT BEDTIME 02/17/14   [name], MD  omeprazole (PRILOSEC) 20 MG capsule TAKE 1 CAPSULE BY MOUTH TWICE A DAY AS NEEDED FORREFLUX 05/12/14   [name], MD  promethazine (PHENERGAN) 25 MG tablet Take 1 tablet (25 mg total) by mouth every 8 (eight) hours as needed for nausea or vomiting. 10/14/13   [name], MD  ranitidine (ZANTAC) 300 MG tablet TAKE 1 TABLET AT BEDTIME 02/17/14   [name], MD  Saline 0.65 % (SOLN) SOLN Place 2 Squirts into the nose 2 (two) times daily. 07/11/11   [name], MD  SUMAtriptan (IMITREX) 50 MG tablet TAKE 1 TABLET BY MOUTH EVERY 2 HOURS AS NEEDED FOR MIGRAINE - NO MORE THAN 2 PER 24 HOURS 10/14/13   [name], MD  venlafaxine (EFFEXOR) 50 MG tablet Take 1 tablet (50 mg total) by mouth 3 (three) times daily with meals. 10/14/13   [name], MD  zolpidem (AMBIEN) 10 MG tablet TAKE 1 TABLET BY MOUTH ONCE DAILY AT BEDTIME AS NEEDED 05/02/14   [name], MD   BP 167/105  Pulse 81  Temp(Src) 97.7 F (36.5 C)  (Other (Comment))  Resp 18  Ht 5\' 3"  (1.6 m)  Wt 195 lb (88.451 kg)  BMI 34.55 kg/m2  SpO2 99%  LMP 12/10/2012 Physical Exam  Constitutional: She is oriented to person, place, and time. She appears well-developed and well-nourished. No distress.  Musculoskeletal:  Right forearm has a well demarcated area of mildly erythematous induration with multiple small raised areas consistent with multiple insect bites in appearance. No blistering. Minimally tender. No fluctuance suggesting abscess.   Neurological: She is alert and oriented to person, place, and time.  ED Course  Procedures (including critical care time) Labs Review Labs Reviewed - No data to display  Imaging Review No results found.   EKG Interpretation None      MDM   Final diagnoses:  None    1. Insect bite  Recommended warm compresses, ibuprofen for localized reaction.    Arnoldo Hooker, PA-C 05/12/14 1229

## 2014-05-13 NOTE — ED Provider Notes (Signed)
Medical screening examination/treatment/procedure(s) were performed by non-physician practitioner and as supervising physician I was immediately available for consultation/collaboration.   EKG Interpretation None        Sulay Brymer, MD 05/13/14 0934 

## 2014-05-19 ENCOUNTER — Encounter: Payer: Self-pay | Admitting: Family Medicine

## 2014-05-19 ENCOUNTER — Ambulatory Visit (INDEPENDENT_AMBULATORY_CARE_PROVIDER_SITE_OTHER): Payer: BC Managed Care – PPO | Admitting: Family Medicine

## 2014-05-19 VITALS — BP 134/93 | HR 109 | Temp 98.1°F | Ht 63.0 in | Wt 201.0 lb

## 2014-05-19 DIAGNOSIS — F419 Anxiety disorder, unspecified: Secondary | ICD-10-CM

## 2014-05-19 DIAGNOSIS — Z23 Encounter for immunization: Secondary | ICD-10-CM

## 2014-05-19 DIAGNOSIS — F418 Other specified anxiety disorders: Secondary | ICD-10-CM

## 2014-05-19 DIAGNOSIS — R030 Elevated blood-pressure reading, without diagnosis of hypertension: Secondary | ICD-10-CM

## 2014-05-19 DIAGNOSIS — M25562 Pain in left knee: Secondary | ICD-10-CM

## 2014-05-19 DIAGNOSIS — E119 Type 2 diabetes mellitus without complications: Secondary | ICD-10-CM

## 2014-05-19 DIAGNOSIS — F32A Depression, unspecified: Secondary | ICD-10-CM

## 2014-05-19 DIAGNOSIS — IMO0001 Reserved for inherently not codable concepts without codable children: Secondary | ICD-10-CM

## 2014-05-19 DIAGNOSIS — K219 Gastro-esophageal reflux disease without esophagitis: Secondary | ICD-10-CM

## 2014-05-19 DIAGNOSIS — E1165 Type 2 diabetes mellitus with hyperglycemia: Secondary | ICD-10-CM

## 2014-05-19 DIAGNOSIS — F329 Major depressive disorder, single episode, unspecified: Secondary | ICD-10-CM

## 2014-05-19 DIAGNOSIS — R748 Abnormal levels of other serum enzymes: Secondary | ICD-10-CM

## 2014-05-19 DIAGNOSIS — E663 Overweight: Secondary | ICD-10-CM

## 2014-05-19 DIAGNOSIS — E785 Hyperlipidemia, unspecified: Secondary | ICD-10-CM

## 2014-05-19 LAB — CBC
HCT: 37.7 % (ref 36.0–46.0)
Hemoglobin: 12.7 g/dL (ref 12.0–15.0)
MCH: 28 pg (ref 26.0–34.0)
MCHC: 33.7 g/dL (ref 30.0–36.0)
MCV: 83.2 fL (ref 78.0–100.0)
PLATELETS: 236 10*3/uL (ref 150–400)
RBC: 4.53 MIL/uL (ref 3.87–5.11)
RDW: 15.9 % — ABNORMAL HIGH (ref 11.5–15.5)
WBC: 5.8 10*3/uL (ref 4.0–10.5)

## 2014-05-19 LAB — HEPATIC FUNCTION PANEL
ALK PHOS: 135 U/L — AB (ref 39–117)
ALT: 14 U/L (ref 0–35)
AST: 13 U/L (ref 0–37)
Albumin: 4.5 g/dL (ref 3.5–5.2)
Bilirubin, Direct: <0.1 mg/dL (ref 0.0–0.3)
Total Bilirubin: 0.2 mg/dL (ref 0.2–1.2)
Total Protein: 7.8 g/dL (ref 6.0–8.3)

## 2014-05-19 LAB — LIPID PANEL
Cholesterol: 200 mg/dL (ref 0–200)
HDL: 62 mg/dL (ref >39)
LDL Cholesterol: 102 mg/dL — ABNORMAL HIGH (ref 0–99)
Total CHOL/HDL Ratio: 3.2 Ratio
Triglycerides: 180 mg/dL — ABNORMAL HIGH (ref <150)
VLDL: 36 mg/dL (ref 0–40)

## 2014-05-19 LAB — RENAL FUNCTION PANEL
Albumin: 4.5 g/dL (ref 3.5–5.2)
BUN: 14 mg/dL (ref 6–23)
CALCIUM: 10 mg/dL (ref 8.4–10.5)
CHLORIDE: 103 meq/L (ref 96–112)
CO2: 22 mEq/L (ref 19–32)
Creat: 0.74 mg/dL (ref 0.50–1.10)
Glucose, Bld: 74 mg/dL (ref 70–99)
PHOSPHORUS: 3.5 mg/dL (ref 2.3–4.6)
POTASSIUM: 3.9 meq/L (ref 3.5–5.3)
Sodium: 138 mEq/L (ref 135–145)

## 2014-05-19 LAB — TSH: TSH: 0.816 u[IU]/mL (ref 0.350–4.500)

## 2014-05-19 MED ORDER — ALPRAZOLAM 0.5 MG PO TABS: 0.5000 mg | ORAL_TABLET | Freq: Three times a day (TID) | ORAL | Status: DC | PRN

## 2014-05-19 MED ORDER — HYDROCODONE-ACETAMINOPHEN 5-325 MG PO TABS: 1.0000 | ORAL_TABLET | Freq: Four times a day (QID) | ORAL | Status: DC | PRN

## 2014-05-19 MED ORDER — VENLAFAXINE HCL ER 75 MG PO CP24: 75.0000 mg | ORAL_CAPSULE | Freq: Every day | ORAL | Status: DC

## 2014-05-19 NOTE — Patient Instructions (Addendum)
Calms Forte for stress by [employer], [email or web address]   Basic Carbohydrate Counting for Diabetes Mellitus Carbohydrate counting is a method for keeping track of the amount of carbohydrates you eat. Eating carbohydrates naturally increases the level of sugar (glucose) in your blood, so it is important for you to know the amount that is okay for you to have in every meal. Carbohydrate counting helps keep the level of glucose in your blood within normal limits. The amount of carbohydrates allowed is different for every person. A dietitian can help you calculate the amount that is right for you. Once you know the amount of carbohydrates you can have, you can count the carbohydrates in the foods you want to eat. Carbohydrates are found in the following foods:  Grains, such as breads and cereals.  Dried beans and soy products.  Starchy vegetables, such as potatoes, peas, and corn.  Fruit and fruit juices.  Milk and yogurt.  Sweets and snack foods, such as cake, cookies, candy, chips, soft drinks, and fruit drinks. CARBOHYDRATE COUNTING There are two ways to count the carbohydrates in your food. You can use either of the methods or a combination of both. Reading the "Nutrition Facts" on Packaged Food The "Nutrition Facts" is an area that is included on the labels of almost all packaged food and beverages in the [place]. It includes the serving size of that food or beverage and information about the nutrients in each serving of the food, including the grams (g) of carbohydrate per serving.  Decide the number of servings of this food or beverage that you will be able to eat or drink. Multiply that number of servings by the number of grams of carbohydrate that is listed on the label for that serving. The total will be the amount of carbohydrates you will be having when you eat or drink this food or beverage. Learning Standard Serving Sizes of Food When you eat food that is not packaged or does  not include "Nutrition Facts" on the label, you need to measure the servings in order to count the amount of carbohydrates.A serving of most carbohydrate-rich foods contains about 15 g of carbohydrates. The following list includes serving sizes of carbohydrate-rich foods that provide 15 g ofcarbohydrate per serving:   1 slice of bread (1 oz) or 1 six-inch tortilla.    of a hamburger bun or English muffin.  4-6 crackers.   cup unsweetened dry cereal.    cup hot cereal.   cup rice or pasta.    cup mashed potatoes or  of a large baked potato.  1 cup fresh fruit or one small piece of fruit.    cup canned or frozen fruit or fruit juice.  1 cup milk.   cup plain fat-free yogurt or yogurt sweetened with artificial sweeteners.   cup cooked dried beans or starchy vegetable, such as peas, corn, or potatoes.  Decide the number of standard-size servings that you will eat. Multiply that number of servings by 15 (the grams of carbohydrates in that serving). For example, if you eat 2 cups of strawberries, you will have eaten 2 servings and 30 g of carbohydrates (2 servings x 15 g = 30 g). For foods such as soups and casseroles, in which more than one food is mixed in, you will need to count the carbohydrates in each food that is included. EXAMPLE OF CARBOHYDRATE COUNTING Sample Dinner  3 oz chicken breast.   cup of brown rice.   cup of corn.  1 cup milk.   1 cup strawberries with sugar-free whipped topping.  Carbohydrate Calculation Step 1: Identify the foods that contain carbohydrates:   Rice.   Corn.   Milk.   Strawberries. Step 2:Calculate the number of servings eaten of each:   2 servings of rice.   1 serving of corn.   1 serving of milk.   1 serving of strawberries. Step 3: Multiply each of those number of servings by 15 g:   2 servings of rice x 15 g = 30 g.   1 serving of corn x 15 g = 15 g.   1 serving of milk x 15 g = 15 g.   1  serving of strawberries x 15 g = 15 g. Step 4: Add together all of the amounts to find the total grams of carbohydrates eaten: 30 g + 15 g + 15 g + 15 g = 75 g. Document Released: 07/28/2005 Document Revised: 12/12/2013 Document Reviewed: 06/24/2013 [facility] Patient Information 2015 [employer] [place], [place]. This information is not intended to replace advice given to you by your health care provider. Make sure you discuss any questions you have with your health care provider.

## 2014-05-19 NOTE — Progress Notes (Signed)
Pre visit review using our clinic review tool, if applicable. No additional management support is needed unless otherwise documented below in the visit note. 

## 2014-05-20 LAB — HEMOGLOBIN A1C
HEMOGLOBIN A1C: 6.5 % — AB (ref <5.7)
MEAN PLASMA GLUCOSE: 140 mg/dL — AB (ref <117)

## 2014-05-21 ENCOUNTER — Encounter: Payer: Self-pay | Admitting: Family Medicine

## 2014-05-21 DIAGNOSIS — R748 Abnormal levels of other serum enzymes: Secondary | ICD-10-CM

## 2014-05-21 HISTORY — DX: Abnormal levels of other serum enzymes: R74.8

## 2014-05-21 NOTE — Assessment & Plan Note (Signed)
Due to busy schedule isses frequent doses of her Venlafaxine will try switching to the once daily version.

## 2014-05-21 NOTE — Assessment & Plan Note (Signed)
Mild but persistent will proceed with abdominal ultrasound if insurance will approve.

## 2014-05-21 NOTE — Assessment & Plan Note (Signed)
Encouraged DASH diet, decrease po intake and increase exercise as tolerated. Needs 7-8 hours of sleep nightly. Avoid trans fats, eat small, frequent meals every 4-5 hours with lean proteins, complex carbs and healthy fats. Minimize simple carbs, GMO foods. 

## 2014-05-21 NOTE — Assessment & Plan Note (Signed)
Avoid offending foods, start probiotics. Do not eat large meals in late evening and consider raising head of bed.  

## 2014-05-21 NOTE — Assessment & Plan Note (Signed)
Well controlled. Encouraged heart healthy diet such as the DASH diet and exercise as tolerated.  

## 2014-05-21 NOTE — Progress Notes (Signed)
Patient ID: Nicole Ball, female   DOB: 01-Jun-1976, 38 y.o.   MRN: [id number] Nicole Ball [id number] 01-Jun-1976 05/21/2014      Progress Note-Follow Up  Subjective  Chief Complaint  Chief Complaint  Patient presents with  . Follow-up    3 month    HPI  Patient is a 38 year old female in today for routine medical care. Patient is in today for followup. Continues to work or school for struggle with her husbands illness. As a result he had a flare. She is taking venlafaxine or secretions the 20 no recent illness. No fevers. Denies CP/palp/SOB/HA/congestion/fevers/GI or GU c/o. Taking meds as prescribed Past Medical History  Diagnosis Date  . Allergy     seasonal  . Acute low back pain 12/19/2010  . Overweight(278.02) 10/16/2010  . SNORING 10/16/2010  . UTI'S, HX OF 10/16/2010  . INSOMNIA 10/16/2010  . HIATAL HERNIA WITH REFLUX, HX OF 10/16/2010  . ALLERGIC RHINITIS 10/16/2010  . Hyperlipidemia 04/21/2011  . Elevated BP 04/21/2011  . Sleep apnea 06/07/2011  . Anxiety and depression 06/07/2011  . Amenorrhea 07/11/2011  . Diabetes mellitus 10/30/2011  . Knee pain, left 03/11/2012  . Reflux 04/22/2012  . Acute sinusitis 08/06/2012  . Migraine 03/07/2013  . Cervical cancer screening 01/10/2014    Menarche at 8 Regular and moderate flow, became irregular until OCPs  history of abnormal pap in past, repeat was normal Last pap roughly 3 years ago G0P0, s/p No history of abnormal MGM, no h/o MGM  No concerns today no gyn surgeries LMP May 2013  . CTS (carpal tunnel syndrome) 01/15/2014  . Preventative health care 01/15/2014  . Abnormal serum level of alkaline phosphatase 05/21/2014    History reviewed. No pertinent past surgical history.  Family History  Problem Relation Age of Onset  . Lupus Mother   . Arthritis Maternal Grandmother   . Scleroderma Maternal Grandmother   . Glaucoma Maternal Grandmother   . Cataracts Maternal Grandmother   . Cancer Maternal Grandfather     prostate  .  Coronary artery disease Maternal Grandfather     s/p angioplasty  . Diabetes Maternal Grandfather     Type 2  . Cancer Paternal Grandmother     Breast ca- dx in 3350's  . Thyroid disease Paternal Grandmother   . Diabetes Paternal Grandfather   . Thyroid disease Sister   . Asthma Sister   . Allergies Brother   . Arthritis Other     History   Social History  . Marital Status: Married    Spouse Name: N/A    Number of Children: N/A  . Years of Education: N/A   Occupational History  . Not on file.   Social History Main Topics  . Smoking status: Current Some Day Smoker -- 0.10 packs/day    Types: Cigars  . Smokeless tobacco: Never Used     Comment: 1 black and milds  . Alcohol Use: Yes     Comment: twice/year  . Drug Use: No  . Sexual Activity: Not on file   Other Topics Concern  . Not on file   Social History Narrative  . No narrative on file    Current Outpatient Prescriptions on File Prior to Visit  Medication Sig Dispense Refill  . BAYER MICROLET LANCETS lancets Use as directed to test daily and as needed for symptoms  100 each  1  . Blood Glucose Monitoring Suppl (CONTOUR BLOOD GLUCOSE SYSTEM) DEVI Use as directed to test  daily and as needed for symptoms  1 Device  0  . cyclobenzaprine (FLEXERIL) 10 MG tablet Take 1 tablet (10 mg total) by mouth 2 (two) times daily as needed for muscle spasms.  60 tablet  1  . fexofenadine (ALLEGRA) 180 MG tablet Take 1 tablet (180 mg total) by mouth daily as needed.  30 tablet  5  . fluticasone (FLONASE) 50 MCG/ACT nasal spray INSTILL 2 SPRAYS IN EACH NOSTRIL DAILY  16 g  3  . glucose blood (BAYER CONTOUR TEST) test strip Use as directed to test daily and as needed for symptoms  100 each  5  . ibuprofen (ADVIL,MOTRIN) 800 MG tablet Take 1 tablet (800 mg total) by mouth 3 (three) times daily.  21 tablet  0  . meloxicam (MOBIC) 15 MG tablet TAKE 1 TABLET BY MOUTH DAILY  90 tablet  0  . metFORMIN (GLUCOPHAGE) 500 MG tablet Take 1 tablet  (500 mg total) by mouth 3 (three) times daily.  90 tablet  3  . metroNIDAZOLE (FLAGYL) 500 MG tablet Take 1 tablet (500 mg total) by mouth 2 (two) times daily.  14 tablet  0  . montelukast (SINGULAIR) 10 MG tablet TAKE 1 TABLET AT BEDTIME  30 tablet  3  . omeprazole (PRILOSEC) 20 MG capsule TAKE 1 CAPSULE BY MOUTH TWICE A DAY AS NEEDED FORREFLUX  60 capsule  0  . promethazine (PHENERGAN) 25 MG tablet Take 1 tablet (25 mg total) by mouth every 8 (eight) hours as needed for nausea or vomiting.  20 tablet  2  . ranitidine (ZANTAC) 300 MG tablet TAKE 1 TABLET AT BEDTIME  30 tablet  1  . Saline 0.65 % (SOLN) SOLN Place 2 Squirts into the nose 2 (two) times daily.  1 Bottle  0  . SUMAtriptan (IMITREX) 50 MG tablet TAKE 1 TABLET BY MOUTH EVERY 2 HOURS AS NEEDED FOR MIGRAINE - NO MORE THAN 2 PER 24 HOURS  10 tablet  2  . zolpidem (AMBIEN) 10 MG tablet TAKE 1 TABLET BY MOUTH ONCE DAILY AT BEDTIME AS NEEDED  30 tablet  5   No current facility-administered medications on file prior to visit.    No Known Allergies  Review of Systems  Review of Systems  Constitutional: Positive for malaise/fatigue. Negative for fever.  HENT: Negative for congestion.   Eyes: Negative for discharge.  Respiratory: Negative for shortness of breath.   Cardiovascular: Negative for chest pain, palpitations and leg swelling.  Gastrointestinal: Negative for nausea, abdominal pain and diarrhea.  Genitourinary: Negative for dysuria.  Musculoskeletal: Positive for back pain and myalgias. Negative for falls.  Skin: Negative for rash.  Neurological: Negative for loss of consciousness and headaches.  Endo/Heme/Allergies: Negative for polydipsia.  Psychiatric/Behavioral: Positive for depression. Negative for suicidal ideas. The patient is nervous/anxious. The patient does not have insomnia.     Objective  BP 134/93  Pulse 109  Temp(Src) 98.1 F (36.7 C) (Oral)  Ht 5\' 3"  (1.6 m)  Wt 201 lb (91.173 kg)  BMI 35.61 kg/m2   SpO2 95%  LMP 12/10/2012  Physical Exam  Physical Exam  Constitutional: She is oriented to person, place, and time and well-developed, well-nourished, and in no distress. No distress.  HENT:  Head: Normocephalic and atraumatic.  Eyes: Conjunctivae are normal.  Neck: Neck supple. No thyromegaly present.  Cardiovascular: Normal rate, regular rhythm and normal heart sounds.   No murmur heard. Pulmonary/Chest: Effort normal and breath sounds normal. She has no wheezes.  Abdominal: She exhibits no distension and no mass.  Musculoskeletal: She exhibits no edema.  Lymphadenopathy:    She has no cervical adenopathy.  Neurological: She is alert and oriented to person, place, and time.  Skin: Skin is warm and dry. No rash noted. She is not diaphoretic.  Psychiatric: Memory, affect and judgment normal.    Lab Results  Component Value Date   TSH 0.816 05/19/2014   Lab Results  Component Value Date   WBC 5.8 05/19/2014   HGB 12.7 05/19/2014   HCT 37.7 05/19/2014   MCV 83.2 05/19/2014   PLT 236 05/19/2014   Lab Results  Component Value Date   CREATININE 0.74 05/19/2014   BUN 14 05/19/2014   NA 138 05/19/2014   K 3.9 05/19/2014   CL 103 05/19/2014   CO2 22 05/19/2014   Lab Results  Component Value Date   ALT 14 05/19/2014   AST 13 05/19/2014   ALKPHOS 135* 05/19/2014   BILITOT 0.2 05/19/2014   Lab Results  Component Value Date   CHOL 200 05/19/2014   Lab Results  Component Value Date   HDL 62 05/19/2014   Lab Results  Component Value Date   LDLCALC 102* 05/19/2014   Lab Results  Component Value Date   TRIG 180* 05/19/2014   Lab Results  Component Value Date   CHOLHDL 3.2 05/19/2014     Assessment & Plan  Elevated BP Well controlled. Encouraged heart healthy diet such as the DASH diet and exercise as tolerated.   Overweight Encouraged DASH diet, decrease po intake and increase exercise as tolerated. Needs 7-8 hours of sleep nightly. Avoid trans fats, eat small, frequent  meals every 4-5 hours with lean proteins, complex carbs and healthy fats. Minimize simple carbs, GMO foods.  Reflux Avoid offending foods, start probiotics. Do not eat large meals in late evening and consider raising head of bed.   Anxiety and depression Due to busy schedule isses frequent doses of her Venlafaxine will try switching to the once daily version.  Abnormal serum level of alkaline phosphatase Mild but persistent will proceed with abdominal ultrasound if insurance will approve.  Diabetes mellitus hgba1c acceptable, minimize simple carbs. Increase exercise as tolerated. Continue current meds  Hyperlipidemia Encouraged heart healthy diet, increase exercise, avoid trans fats, consider a krill oil cap daily

## 2014-05-21 NOTE — Assessment & Plan Note (Signed)
Encouraged heart healthy diet, increase exercise, avoid trans fats, consider a krill oil cap daily 

## 2014-05-21 NOTE — Assessment & Plan Note (Signed)
hgba1c acceptable, minimize simple carbs. Increase exercise as tolerated. Continue current meds 

## 2014-05-28 ENCOUNTER — Other Ambulatory Visit: Payer: Self-pay | Admitting: Family Medicine

## 2014-05-28 MED ORDER — TRIAMCINOLONE ACETONIDE 0.1 % EX CREA: 1.0000 "application " | TOPICAL_CREAM | Freq: Two times a day (BID) | CUTANEOUS | Status: DC | PRN

## 2014-05-28 MED ORDER — VENLAFAXINE HCL ER 150 MG PO CP24: 150.0000 mg | ORAL_CAPSULE | Freq: Every day | ORAL | Status: DC

## 2014-05-31 ENCOUNTER — Telehealth: Payer: Self-pay | Admitting: Family Medicine

## 2014-05-31 NOTE — Telephone Encounter (Signed)
Caller name: Lajuan LinesGriffin, Lizzet Z Relation to pt: self  Call back number: [phone]   Reason for call:  Pt would like to discuss unknown foreign rash outside of left ear advised pt to make an appointment pt stated she would like to speak with Uplandhristy first.

## 2014-06-01 ENCOUNTER — Telehealth: Payer: Self-pay | Admitting: Family Medicine

## 2014-06-01 NOTE — Telephone Encounter (Signed)
Pt informed that Dr Abner GreenspanBlyth sent in cream on 05-28-14 and that we left a message for her to return our call on 05-29-14.  Pt states that she will go get the cream to see if it helps

## 2014-06-01 NOTE — Telephone Encounter (Signed)
Caller name:Nicole Ball, Hurley Z Relation to pt: self  Call back number:[phone]  Reason for call:   Pt requesting a letter excusing her from school do to the fact she was assisting her husband who is also a pt of yours [name], [name] 05/05/1981 he was admitted to the hospital (all dates below noted in the system). Just a generic letter pt requesting form by Monday 06/05/14. Please advise    03/31/14 04/27/14 05/12/14 05/19/14 05/23/14 05/31/14 06/01/14

## 2014-06-02 NOTE — Telephone Encounter (Signed)
OK to write letter for excusing from school for husband's recent illness, seen in medical facility on these days.

## 2014-06-05 NOTE — Telephone Encounter (Signed)
Left a message on patients vm stating that letter is ready to be picked up

## 2014-06-26 ENCOUNTER — Other Ambulatory Visit: Payer: Self-pay | Admitting: Family Medicine

## 2014-07-11 ENCOUNTER — Telehealth: Payer: Self-pay

## 2014-07-11 DIAGNOSIS — M25562 Pain in left knee: Secondary | ICD-10-CM

## 2014-07-11 MED ORDER — HYDROCODONE-ACETAMINOPHEN 5-325 MG PO TABS: 1.0000 | ORAL_TABLET | Freq: Four times a day (QID) | ORAL | Status: DC | PRN

## 2014-07-11 NOTE — Telephone Encounter (Signed)
Please call and inform pt this is ready to be picked up   Last RX done on 05-19-14 quantity 100 with 0 refills  Printed for md to sign

## 2014-07-11 NOTE — Telephone Encounter (Signed)
Nicole Ball  [id number] [facility]  Nicole Ball called to say she needs refills on her HYDROcodone-acetaminophen (NORCO/VICODIN) 5-325 MG per tablet / zolpidem (AMBIEN) 10 MG tablet

## 2014-07-12 NOTE — Telephone Encounter (Signed)
Informed patient of this.  °

## 2014-08-02 ENCOUNTER — Telehealth: Payer: Self-pay | Admitting: Family Medicine

## 2014-08-02 DIAGNOSIS — F419 Anxiety disorder, unspecified: Secondary | ICD-10-CM

## 2014-08-02 MED ORDER — ALPRAZOLAM 0.5 MG PO TABS: 0.5000 mg | ORAL_TABLET | Freq: Three times a day (TID) | ORAL | Status: DC | PRN

## 2014-08-02 NOTE — Telephone Encounter (Signed)
Leaving town tonight and is out of medication and needs [facility]ill [facility]

## 2014-08-02 NOTE — Telephone Encounter (Signed)
Alprazolam Last filled:  05/19/14 Amt:  90, 2 refills No contract or UDS on file   Please advise Abner Greenspan([facility] patient).

## 2014-08-02 NOTE — Telephone Encounter (Signed)
Patient requesting alprazolam refill as well

## 2014-08-02 NOTE — Telephone Encounter (Signed)
Pt requesting a refill of omeprazole (PRILOSEC) 20 MG capsule & montelukast (SINGULAIR) 10 MG tablet & [facility] [address], [place], [place] [id number] [phone]

## 2014-08-02 NOTE — Telephone Encounter (Signed)
Ok to send in #90 tabs zero refills of alprazolam.

## 2014-08-02 NOTE — Telephone Encounter (Signed)
[facility], notified pt.

## 2014-08-21 ENCOUNTER — Ambulatory Visit: Payer: Self-pay | Admitting: Family Medicine

## 2014-09-12 ENCOUNTER — Ambulatory Visit (INDEPENDENT_AMBULATORY_CARE_PROVIDER_SITE_OTHER): Payer: BLUE CROSS/BLUE SHIELD | Admitting: Family Medicine

## 2014-09-12 ENCOUNTER — Encounter: Payer: Self-pay | Admitting: Family Medicine

## 2014-09-12 VITALS — BP 131/90 | HR 89 | Temp 97.9°F | Resp 16 | Wt 201.8 lb

## 2014-09-12 DIAGNOSIS — K219 Gastro-esophageal reflux disease without esophagitis: Secondary | ICD-10-CM

## 2014-09-12 DIAGNOSIS — E119 Type 2 diabetes mellitus without complications: Secondary | ICD-10-CM

## 2014-09-12 DIAGNOSIS — E669 Obesity, unspecified: Secondary | ICD-10-CM

## 2014-09-12 DIAGNOSIS — K449 Diaphragmatic hernia without obstruction or gangrene: Secondary | ICD-10-CM

## 2014-09-12 DIAGNOSIS — E1169 Type 2 diabetes mellitus with other specified complication: Secondary | ICD-10-CM

## 2014-09-12 DIAGNOSIS — E785 Hyperlipidemia, unspecified: Secondary | ICD-10-CM

## 2014-09-12 DIAGNOSIS — R748 Abnormal levels of other serum enzymes: Secondary | ICD-10-CM

## 2014-09-12 DIAGNOSIS — G4700 Insomnia, unspecified: Secondary | ICD-10-CM

## 2014-09-12 DIAGNOSIS — M25562 Pain in left knee: Secondary | ICD-10-CM

## 2014-09-12 DIAGNOSIS — F329 Major depressive disorder, single episode, unspecified: Secondary | ICD-10-CM

## 2014-09-12 DIAGNOSIS — F418 Other specified anxiety disorders: Secondary | ICD-10-CM

## 2014-09-12 DIAGNOSIS — F419 Anxiety disorder, unspecified: Secondary | ICD-10-CM

## 2014-09-12 DIAGNOSIS — F32A Depression, unspecified: Secondary | ICD-10-CM

## 2014-09-12 DIAGNOSIS — R1031 Right lower quadrant pain: Secondary | ICD-10-CM

## 2014-09-12 DIAGNOSIS — R000 Tachycardia, unspecified: Secondary | ICD-10-CM

## 2014-09-12 DIAGNOSIS — E782 Mixed hyperlipidemia: Secondary | ICD-10-CM

## 2014-09-12 MED ORDER — METOPROLOL SUCCINATE ER 25 MG PO TB24: 25.0000 mg | ORAL_TABLET | Freq: Every day | ORAL | Status: DC

## 2014-09-12 MED ORDER — HYDROCODONE-ACETAMINOPHEN 5-325 MG PO TABS: 1.0000 | ORAL_TABLET | Freq: Four times a day (QID) | ORAL | Status: DC | PRN

## 2014-09-12 MED ORDER — ALPRAZOLAM 0.5 MG PO TABS: 0.5000 mg | ORAL_TABLET | Freq: Three times a day (TID) | ORAL | Status: DC | PRN

## 2014-09-12 MED ORDER — AMITRIPTYLINE HCL 10 MG PO TABS: 10.0000 mg | ORAL_TABLET | Freq: Every evening | ORAL | Status: DC | PRN

## 2014-09-12 NOTE — Progress Notes (Signed)
Nicole Ball  [id number] 06/18/1976 09/12/2014      Progress Note-Follow Up  Subjective  Chief Complaint  Chief Complaint  Patient presents with  . Follow-up    3 month  . Medication Refill    would like to talk about Xanax dose    HPI  Patient is a 39 y.o. female in today for routine medical care. And headache. No fevers or chills. No malaise or myalgias. Continues to manage her stressors despite being in school and working. No recent illness. Denies CP/palp/SOB/HA/congestion/fevers/GI or GU c/o. Taking meds as prescribed  Past Medical History  Diagnosis Date  . Allergy     seasonal  . Acute low back pain 12/19/2010  . Overweight(278.02) 10/16/2010  . SNORING 10/16/2010  . UTI'S, HX OF 10/16/2010  . INSOMNIA 10/16/2010  . HIATAL HERNIA WITH REFLUX, HX OF 10/16/2010  . ALLERGIC RHINITIS 10/16/2010  . Hyperlipidemia 04/21/2011  . Elevated BP 04/21/2011  . Sleep apnea 06/07/2011  . Anxiety and depression 06/07/2011  . Amenorrhea 07/11/2011  . Diabetes mellitus 10/30/2011  . Knee pain, left 03/11/2012  . Reflux 04/22/2012  . Acute sinusitis 08/06/2012  . Migraine 03/07/2013  . Cervical cancer screening 01/10/2014    Menarche at 8 Regular and moderate flow, became irregular until OCPs  history of abnormal pap in past, repeat was normal Last pap roughly 3 years ago G0P0, s/p No history of abnormal MGM, no h/o MGM  No concerns today no gyn surgeries LMP May 2013  . CTS (carpal tunnel syndrome) 01/15/2014  . Preventative health care 01/15/2014  . Abnormal serum level of alkaline phosphatase 05/21/2014    History reviewed. No pertinent past surgical history.  Family History  Problem Relation Age of Onset  . Lupus Mother   . Arthritis Maternal Grandmother   . Scleroderma Maternal Grandmother   . Glaucoma Maternal Grandmother   . Cataracts Maternal Grandmother   . Cancer Maternal Grandfather     prostate  . Coronary artery disease Maternal Grandfather     s/p angioplasty  . Diabetes  Maternal Grandfather     Type 2  . Cancer Paternal Grandmother     Breast ca- dx in 650's  . Thyroid disease Paternal Grandmother   . Diabetes Paternal Grandfather   . Thyroid disease Sister   . Asthma Sister   . Allergies Brother   . Arthritis Other     History   Social History  . Marital Status: Married    Spouse Name: N/A    Number of Children: N/A  . Years of Education: N/A   Occupational History  . Not on file.   Social History Main Topics  . Smoking status: Current Some Day Smoker -- 0.10 packs/day    Types: Cigars  . Smokeless tobacco: Never Used     Comment: 1 black and milds  . Alcohol Use: Yes     Comment: twice/year  . Drug Use: No  . Sexual Activity: Not on file   Other Topics Concern  . Not on file   Social History Narrative    Current Outpatient Prescriptions on File Prior to Visit  Medication Sig Dispense Refill  . ALPRAZolam (XANAX) 0.5 MG tablet Take 1 tablet (0.5 mg total) by mouth 3 (three) times daily as needed for sleep or anxiety. 90 tablet 0  . BAYER MICROLET LANCETS lancets Use as directed to test daily and as needed for symptoms 100 each 1  . Blood Glucose Monitoring Suppl (CONTOUR BLOOD GLUCOSE  SYSTEM) DEVI Use as directed to test daily and as needed for symptoms 1 Device 0  . cyclobenzaprine (FLEXERIL) 10 MG tablet Take 1 tablet (10 mg total) by mouth 2 (two) times daily as needed for muscle spasms. 60 tablet 1  . fexofenadine (ALLEGRA) 180 MG tablet Take 1 tablet (180 mg total) by mouth daily as needed. 30 tablet 5  . fluticasone (FLONASE) 50 MCG/ACT nasal spray INSTILL 2 SPRAYS IN EACH NOSTRIL DAILY 16 g 3  . glucose blood (BAYER CONTOUR TEST) test strip Use as directed to test daily and as needed for symptoms 100 each 5  . HYDROcodone-acetaminophen (NORCO/VICODIN) 5-325 MG per tablet Take 1 tablet by mouth every 6 (six) hours as needed. 100 tablet 0  . ibuprofen (ADVIL,MOTRIN) 800 MG tablet Take 1 tablet (800 mg total) by mouth 3 (three)  times daily. 21 tablet 0  . meloxicam (MOBIC) 15 MG tablet TAKE 1 TABLET BY MOUTH DAILY 90 tablet 0  . metFORMIN (GLUCOPHAGE) 500 MG tablet Take 1 tablet (500 mg total) by mouth 3 (three) times daily. 90 tablet 3  . montelukast (SINGULAIR) 10 MG tablet TAKE 1 TABLET AT BEDTIME 30 tablet 3  . omeprazole (PRILOSEC) 20 MG capsule TAKE 1 CAPSULE BY MOUTH TWICE A DAY AS NEEDED FORREFLUX 60 capsule 0  . promethazine (PHENERGAN) 25 MG tablet Take 1 tablet (25 mg total) by mouth every 8 (eight) hours as needed for nausea or vomiting. 20 tablet 2  . ranitidine (ZANTAC) 300 MG tablet TAKE 1 TABLET AT BEDTIME 30 tablet 1  . Saline 0.65 % (SOLN) SOLN Place 2 Squirts into the nose 2 (two) times daily. 1 Bottle 0  . SUMAtriptan (IMITREX) 50 MG tablet TAKE 1 TABLET BY MOUTH EVERY 2 HOURS AS NEEDED FOR MIGRAINE - NO MORE THAN 2 PER 24 HOURS 10 tablet 2  . venlafaxine XR (EFFEXOR XR) 150 MG 24 hr capsule Take 1 capsule (150 mg total) by mouth daily with breakfast. 30 capsule 2  . zolpidem (AMBIEN) 10 MG tablet TAKE 1 TABLET BY MOUTH ONCE DAILY AT BEDTIME AS NEEDED 30 tablet 5   No current facility-administered medications on file prior to visit.    No Known Allergies  Review of Systems  Review of Systems  Constitutional: Negative for fever and malaise/fatigue.  HENT: Positive for congestion. Negative for hearing loss.   Eyes: Negative for discharge.  Respiratory: Negative for shortness of breath.   Cardiovascular: Negative for chest pain, palpitations and leg swelling.  Gastrointestinal: Negative for nausea, abdominal pain and diarrhea.  Genitourinary: Negative for dysuria.  Musculoskeletal: Negative for falls.  Skin: Negative for rash.  Neurological: Positive for headaches. Negative for loss of consciousness.  Endo/Heme/Allergies: Negative for polydipsia.  Psychiatric/Behavioral: Negative for depression and suicidal ideas. The patient is not nervous/anxious and does not have insomnia.      Objective  BP 131/90 mmHg  Pulse 89  Temp(Src) 97.9 F (36.6 C) (Oral)  Resp 16  Wt 201 lb 12.8 oz (91.536 kg)  SpO2 99%  LMP 12/10/2012  Physical Exam  Physical Exam  Constitutional: She is oriented to person, place, and time and well-developed, well-nourished, and in no distress. No distress.  HENT:  Head: Normocephalic and atraumatic.  Eyes: Conjunctivae are normal.  Neck: Neck supple. No thyromegaly present.  Cardiovascular: Normal rate, regular rhythm and normal heart sounds.   No murmur heard. Pulmonary/Chest: Effort normal and breath sounds normal. She has no wheezes.  Abdominal: She exhibits no distension and no  mass.  Musculoskeletal: She exhibits no edema.  Lymphadenopathy:    She has no cervical adenopathy.  Neurological: She is alert and oriented to person, place, and time.  Skin: Skin is warm and dry. No rash noted. She is not diaphoretic.  Psychiatric: Memory, affect and judgment normal.    Lab Results  Component Value Date   TSH 0.816 05/19/2014   Lab Results  Component Value Date   WBC 5.8 05/19/2014   HGB 12.7 05/19/2014   HCT 37.7 05/19/2014   MCV 83.2 05/19/2014   PLT 236 05/19/2014   Lab Results  Component Value Date   CREATININE 0.74 05/19/2014   BUN 14 05/19/2014   NA 138 05/19/2014   K 3.9 05/19/2014   CL 103 05/19/2014   CO2 22 05/19/2014   Lab Results  Component Value Date   ALT 14 05/19/2014   AST 13 05/19/2014   ALKPHOS 135* 05/19/2014   BILITOT 0.2 05/19/2014   Lab Results  Component Value Date   CHOL 200 05/19/2014   Lab Results  Component Value Date   HDL 62 05/19/2014   Lab Results  Component Value Date   LDLCALC 102* 05/19/2014   Lab Results  Component Value Date   TRIG 180* 05/19/2014   Lab Results  Component Value Date   CHOLHDL 3.2 05/19/2014     Assessment & Plan  Diabetes mellitus type 2 in obese hgba1c acceptable, up to 7.1, minimize simple carbs. Increase exercise as tolerated. Continue  current meds   Hyperlipidemia Encouraged heart healthy diet, increase exercise, avoid trans fats, consider a krill oil cap daily   Abnormal serum level of alkaline phosphatase Increased with no obvious cause, will encourage increased hydration and recheck in 1 month if still up will proceed with further testing   Anxiety and depression Allowed refill on Alprazolam, continues on Venlafaxine and is juggling home, school and work well   Hiatal hernia with gastroesophageal reflux Avoid offending foods, start probiotics. Do not eat large meals in late evening and consider raising head of bed.

## 2014-09-12 NOTE — Patient Instructions (Signed)
Consider Elderberry/Zinc liquid whenever you feel more congested, can buy online at [employer], [employer] company is a good source for this.   Basic Carbohydrate Counting for Diabetes Mellitus Carbohydrate counting is a method for keeping track of the amount of carbohydrates you eat. Eating carbohydrates naturally increases the level of sugar (glucose) in your blood, so it is important for you to know the amount that is okay for you to have in every meal. Carbohydrate counting helps keep the level of glucose in your blood within normal limits. The amount of carbohydrates allowed is different for every person. A dietitian can help you calculate the amount that is right for you. Once you know the amount of carbohydrates you can have, you can count the carbohydrates in the foods you want to eat. Carbohydrates are found in the following foods:  Grains, such as breads and cereals.  Dried beans and soy products.  Starchy vegetables, such as potatoes, peas, and corn.  Fruit and fruit juices.  Milk and yogurt.  Sweets and snack foods, such as cake, cookies, candy, chips, soft drinks, and fruit drinks. CARBOHYDRATE COUNTING There are two ways to count the carbohydrates in your food. You can use either of the methods or a combination of both. Reading the "Nutrition Facts" on Packaged Food The "Nutrition Facts" is an area that is included on the labels of almost all packaged food and beverages in the [place]. It includes the serving size of that food or beverage and information about the nutrients in each serving of the food, including the grams (g) of carbohydrate per serving.  Decide the number of servings of this food or beverage that you will be able to eat or drink. Multiply that number of servings by the number of grams of carbohydrate that is listed on the label for that serving. The total will be the amount of carbohydrates you will be having when you eat or drink this food or  beverage. Learning Standard Serving Sizes of Food When you eat food that is not packaged or does not include "Nutrition Facts" on the label, you need to measure the servings in order to count the amount of carbohydrates.A serving of most carbohydrate-rich foods contains about 15 g of carbohydrates. The following list includes serving sizes of carbohydrate-rich foods that provide 15 g ofcarbohydrate per serving:   1 slice of bread (1 oz) or 1 six-inch tortilla.    of a hamburger bun or English muffin.  4-6 crackers.   cup unsweetened dry cereal.    cup hot cereal.   cup rice or pasta.    cup mashed potatoes or  of a large baked potato.  1 cup fresh fruit or one small piece of fruit.    cup canned or frozen fruit or fruit juice.  1 cup milk.   cup plain fat-free yogurt or yogurt sweetened with artificial sweeteners.   cup cooked dried beans or starchy vegetable, such as peas, corn, or potatoes.  Decide the number of standard-size servings that you will eat. Multiply that number of servings by 15 (the grams of carbohydrates in that serving). For example, if you eat 2 cups of strawberries, you will have eaten 2 servings and 30 g of carbohydrates (2 servings x 15 g = 30 g). For foods such as soups and casseroles, in which more than one food is mixed in, you will need to count the carbohydrates in each food that is included. EXAMPLE OF CARBOHYDRATE COUNTING Sample Dinner  3 oz  chicken breast.   cup of brown rice.   cup of corn.  1 cup milk.   1 cup strawberries with sugar-free whipped topping.  Carbohydrate Calculation Step 1: Identify the foods that contain carbohydrates:   Rice.   Corn.   Milk.   Strawberries. Step 2:Calculate the number of servings eaten of each:   2 servings of rice.   1 serving of corn.   1 serving of milk.   1 serving of strawberries. Step 3: Multiply each of those number of servings by 15 g:   2 servings of  rice x 15 g = 30 g.   1 serving of corn x 15 g = 15 g.   1 serving of milk x 15 g = 15 g.   1 serving of strawberries x 15 g = 15 g. Step 4: Add together all of the amounts to find the total grams of carbohydrates eaten: 30 g + 15 g + 15 g + 15 g = 75 g. Document Released: 07/28/2005 Document Revised: 12/12/2013 Document Reviewed: 06/24/2013 [facility] Patient Information 2015 [place], [place]. This information is not intended to replace advice given to you by your health care provider. Make sure you discuss any questions you have with your health care provider.

## 2014-09-12 NOTE — Progress Notes (Signed)
Pre visit review using our clinic review tool, if applicable. No additional management support is needed unless otherwise documented below in the visit note. 

## 2014-09-13 LAB — URINALYSIS
BILIRUBIN URINE: NEGATIVE
Hgb urine dipstick: NEGATIVE
Ketones, ur: NEGATIVE
Leukocytes, UA: NEGATIVE
Nitrite: NEGATIVE
Specific Gravity, Urine: 1.01 (ref 1.000–1.030)
Total Protein, Urine: NEGATIVE
URINE GLUCOSE: NEGATIVE
Urobilinogen, UA: 0.2 (ref 0.0–1.0)
pH: 7 (ref 5.0–8.0)

## 2014-09-13 LAB — COMPREHENSIVE METABOLIC PANEL
ALT: 23 U/L (ref 0–35)
AST: 17 U/L (ref 0–37)
Albumin: 4.4 g/dL (ref 3.5–5.2)
Alkaline Phosphatase: 162 U/L — ABNORMAL HIGH (ref 39–117)
BUN: 14 mg/dL (ref 6–23)
CHLORIDE: 104 meq/L (ref 96–112)
CO2: 25 mEq/L (ref 19–32)
CREATININE: 0.81 mg/dL (ref 0.40–1.20)
Calcium: 10.5 mg/dL (ref 8.4–10.5)
GFR: 100.57 mL/min (ref >60.00)
GLUCOSE: 118 mg/dL — AB (ref 70–99)
Potassium: 3.6 mEq/L (ref 3.5–5.1)
Sodium: 139 mEq/L (ref 135–145)
Total Bilirubin: 0.2 mg/dL (ref 0.2–1.2)
Total Protein: 7.6 g/dL (ref 6.0–8.3)

## 2014-09-13 LAB — CBC
HCT: 37.8 % (ref 36.0–46.0)
Hemoglobin: 12.4 g/dL (ref 12.0–15.0)
MCHC: 32.9 g/dL (ref 30.0–36.0)
MCV: 85.7 fl (ref 78.0–100.0)
Platelets: 206 K/uL (ref 150.0–400.0)
RBC: 4.41 Mil/uL (ref 3.87–5.11)
RDW: 16.1 % — ABNORMAL HIGH (ref 11.5–15.5)
WBC: 7.1 K/uL (ref 4.0–10.5)

## 2014-09-13 LAB — HEMOGLOBIN A1C: Hgb A1c MFr Bld: 7.1 % — ABNORMAL HIGH (ref 4.6–6.5)

## 2014-09-13 LAB — LIPID PANEL
CHOLESTEROL: 175 mg/dL (ref 0–200)
HDL: 67.6 mg/dL (ref >39.00)
LDL Cholesterol: 79 mg/dL (ref 0–99)
NONHDL: 107.4
TRIGLYCERIDES: 142 mg/dL (ref 0.0–149.0)
Total CHOL/HDL Ratio: 3
VLDL: 28.4 mg/dL (ref 0.0–40.0)

## 2014-09-13 LAB — TSH: TSH: 1.43 u[IU]/mL (ref 0.35–4.50)

## 2014-09-13 LAB — URINE CULTURE: Colony Count: >=100000

## 2014-09-14 ENCOUNTER — Ambulatory Visit: Payer: Self-pay | Admitting: Family Medicine

## 2014-09-17 ENCOUNTER — Encounter: Payer: Self-pay | Admitting: Family Medicine

## 2014-09-17 NOTE — Assessment & Plan Note (Signed)
hgba1c acceptable, up to 7.1, minimize simple carbs. Increase exercise as tolerated. Continue current meds

## 2014-09-17 NOTE — Assessment & Plan Note (Signed)
Avoid offending foods, start probiotics. Do not eat large meals in late evening and consider raising head of bed.  

## 2014-09-17 NOTE — Assessment & Plan Note (Signed)
Allowed refill on Alprazolam, continues on Venlafaxine and is juggling home, school and work well

## 2014-09-17 NOTE — Assessment & Plan Note (Signed)
Increased with no obvious cause, will encourage increased hydration and recheck in 1 month if still up will proceed with further testing

## 2014-09-17 NOTE — Assessment & Plan Note (Signed)
Encouraged heart healthy diet, increase exercise, avoid trans fats, consider a krill oil cap daily 

## 2014-09-21 ENCOUNTER — Telehealth: Payer: Self-pay | Admitting: *Deleted

## 2014-09-21 DIAGNOSIS — R748 Abnormal levels of other serum enzymes: Secondary | ICD-10-CM

## 2014-09-21 NOTE — Telephone Encounter (Signed)
-----  Message from Mosie Lukes, MD sent at 09/17/2014  7:54 PM EST ----- Notify Alk Phos up some. Increase hydration and recheck in 2-4 weeks.

## 2014-09-21 NOTE — Telephone Encounter (Signed)
Called and lmom @ 11:31am @ [phone]) asking the pt to RTC regarding lab results.//AB/CMA

## 2014-09-22 ENCOUNTER — Other Ambulatory Visit: Payer: BLUE CROSS/BLUE SHIELD

## 2014-09-26 NOTE — Telephone Encounter (Signed)
Called and spoke with the pt and informed her of note below.  Pt verbalized understanding and agreed.  Lab appt scheduled for Friday(10/06/14 @ 4:00pm).  Future lab ordered and send.//AB/CMA

## 2014-09-29 ENCOUNTER — Other Ambulatory Visit: Payer: BLUE CROSS/BLUE SHIELD

## 2014-10-06 ENCOUNTER — Other Ambulatory Visit: Payer: BLUE CROSS/BLUE SHIELD

## 2014-10-09 ENCOUNTER — Other Ambulatory Visit: Payer: Self-pay | Admitting: Family Medicine

## 2014-10-09 MED ORDER — VENLAFAXINE HCL ER 150 MG PO CP24: 150.0000 mg | ORAL_CAPSULE | Freq: Every day | ORAL | Status: DC

## 2014-10-09 MED ORDER — METFORMIN HCL 500 MG PO TABS: 500.0000 mg | ORAL_TABLET | Freq: Three times a day (TID) | ORAL | Status: DC

## 2014-10-13 ENCOUNTER — Telehealth: Payer: Self-pay | Admitting: Family Medicine

## 2014-10-13 ENCOUNTER — Other Ambulatory Visit: Payer: BLUE CROSS/BLUE SHIELD

## 2014-10-13 DIAGNOSIS — R1031 Right lower quadrant pain: Secondary | ICD-10-CM

## 2014-10-13 DIAGNOSIS — M25562 Pain in left knee: Secondary | ICD-10-CM

## 2014-10-13 DIAGNOSIS — G4700 Insomnia, unspecified: Secondary | ICD-10-CM

## 2014-10-13 DIAGNOSIS — E1169 Type 2 diabetes mellitus with other specified complication: Secondary | ICD-10-CM

## 2014-10-13 DIAGNOSIS — F419 Anxiety disorder, unspecified: Secondary | ICD-10-CM

## 2014-10-13 DIAGNOSIS — E669 Obesity, unspecified: Secondary | ICD-10-CM

## 2014-10-13 DIAGNOSIS — E782 Mixed hyperlipidemia: Secondary | ICD-10-CM

## 2014-10-13 NOTE — Telephone Encounter (Signed)
Last OV 09/12/14 Last Refill 09/12/14 #100 with 0 refills

## 2014-10-13 NOTE — Telephone Encounter (Signed)
Caller name: Lajuan LinesGriffin, Karrigan Z Relation to pt: self  Call back number: [phone]   Reason for call:  Pt requesting a refill HYDROcodone-acetaminophen (NORCO/VICODIN) 5-325 MG per tablet. Requesting to pick up RX today

## 2014-10-15 NOTE — Telephone Encounter (Signed)
OK to refill meds with same sig and same number, does she need a UDS? Contract?

## 2014-10-16 MED ORDER — HYDROCODONE-ACETAMINOPHEN 5-325 MG PO TABS: 1.0000 | ORAL_TABLET | Freq: Four times a day (QID) | ORAL | Status: DC | PRN

## 2014-10-16 NOTE — Telephone Encounter (Signed)
Printed prescription as instructed by PCP and checked in chart patient has signed a contract but needs UDS. Called the patient left message to call back

## 2014-10-16 NOTE — Telephone Encounter (Signed)
Called the patient left a detailed message hardcopy is ready for pickup and to do UDS before getting prescription. 

## 2014-10-17 ENCOUNTER — Other Ambulatory Visit (INDEPENDENT_AMBULATORY_CARE_PROVIDER_SITE_OTHER): Payer: BLUE CROSS/BLUE SHIELD

## 2014-10-17 DIAGNOSIS — R748 Abnormal levels of other serum enzymes: Secondary | ICD-10-CM

## 2014-10-17 DIAGNOSIS — R8290 Unspecified abnormal findings in urine: Secondary | ICD-10-CM

## 2014-10-17 NOTE — Addendum Note (Signed)
Addended by: Eustace QuailEABOLD, Isla Sabree J on: 10/17/2014 05:05 PM   Modules accepted: Orders

## 2014-10-18 LAB — COMPREHENSIVE METABOLIC PANEL
ALK PHOS: 133 U/L — AB (ref 39–117)
ALT: 16 U/L (ref 0–35)
AST: 14 U/L (ref 0–37)
Albumin: 4.1 g/dL (ref 3.5–5.2)
BILIRUBIN TOTAL: 0.2 mg/dL (ref 0.2–1.2)
BUN: 11 mg/dL (ref 6–23)
CO2: 25 meq/L (ref 19–32)
Calcium: 9.7 mg/dL (ref 8.4–10.5)
Chloride: 105 mEq/L (ref 96–112)
Creatinine, Ser: 0.69 mg/dL (ref 0.40–1.20)
GFR: 120.95 mL/min (ref >60.00)
GLUCOSE: 70 mg/dL (ref 70–99)
Potassium: 4 mEq/L (ref 3.5–5.1)
Sodium: 135 mEq/L (ref 135–145)
TOTAL PROTEIN: 7.3 g/dL (ref 6.0–8.3)

## 2014-10-18 LAB — URINALYSIS
BILIRUBIN URINE: NEGATIVE
HGB URINE DIPSTICK: NEGATIVE
KETONES UR: NEGATIVE
Leukocytes, UA: NEGATIVE
Nitrite: NEGATIVE
Specific Gravity, Urine: 1.025 (ref 1.000–1.030)
TOTAL PROTEIN, URINE-UPE24: NEGATIVE
URINE GLUCOSE: NEGATIVE
Urobilinogen, UA: 0.2 (ref 0.0–1.0)
pH: 6 (ref 5.0–8.0)

## 2014-10-18 LAB — URINE CULTURE: Colony Count: 70000

## 2014-11-04 ENCOUNTER — Other Ambulatory Visit: Payer: Self-pay | Admitting: Family Medicine

## 2014-11-11 ENCOUNTER — Other Ambulatory Visit: Payer: Self-pay | Admitting: Family Medicine

## 2014-11-13 ENCOUNTER — Telehealth: Payer: Self-pay | Admitting: *Deleted

## 2014-11-13 NOTE — Telephone Encounter (Signed)
Xanax 0.5 Last OV-09/12/14 Last refilled- 09/12/14 # 90 / 0 rf  UDS- 10/17/14 Low risk

## 2014-11-13 NOTE — Telephone Encounter (Signed)
OK to refill Alprazolam same sig, same strength, same #90 no refills

## 2014-11-14 MED ORDER — ALPRAZOLAM 0.5 MG PO TABS: 0.5000 mg | ORAL_TABLET | Freq: Three times a day (TID) | ORAL | Status: DC | PRN

## 2014-11-14 NOTE — Telephone Encounter (Signed)
Done//AB/CMA 

## 2014-11-14 NOTE — Telephone Encounter (Signed)
Rx printed, signed by Dr. Abner GreenspanBlyth and [facility] to [facility].

## 2014-11-16 ENCOUNTER — Telehealth: Payer: Self-pay | Admitting: Family Medicine

## 2014-11-16 ENCOUNTER — Telehealth: Payer: Self-pay | Admitting: *Deleted

## 2014-11-16 DIAGNOSIS — G4700 Insomnia, unspecified: Secondary | ICD-10-CM

## 2014-11-16 DIAGNOSIS — E669 Obesity, unspecified: Secondary | ICD-10-CM

## 2014-11-16 DIAGNOSIS — R1031 Right lower quadrant pain: Secondary | ICD-10-CM

## 2014-11-16 DIAGNOSIS — E1169 Type 2 diabetes mellitus with other specified complication: Secondary | ICD-10-CM

## 2014-11-16 DIAGNOSIS — E782 Mixed hyperlipidemia: Secondary | ICD-10-CM

## 2014-11-16 DIAGNOSIS — F419 Anxiety disorder, unspecified: Secondary | ICD-10-CM

## 2014-11-16 DIAGNOSIS — M25562 Pain in left knee: Secondary | ICD-10-CM

## 2014-11-16 MED ORDER — HYDROCODONE-ACETAMINOPHEN 5-325 MG PO TABS: 1.0000 | ORAL_TABLET | Freq: Four times a day (QID) | ORAL | Status: DC | PRN

## 2014-11-16 NOTE — Telephone Encounter (Signed)
Verbal order from Dr. Abner GreenspanBlyth that patient is OK for Norco and to print, she will sign.

## 2014-11-16 NOTE — Telephone Encounter (Signed)
Norco:   Last refill 10/16/14 for 100 and 0 LOV: 09/12/14 UDS 10/17/14- low risk  Controlled Substance Contract 10/17/14  Please advise

## 2014-11-16 NOTE — Telephone Encounter (Signed)
Caller name: Hilario Quarryaishia Relation to pt: self Call back number: [phone] Pharmacy:  Reason for call:   Requesting norco refill. Patient husband has appointment this afternoon with Dr. Laury AxonLowne and is hoping that he can pick rx up at that time.

## 2014-12-01 ENCOUNTER — Other Ambulatory Visit: Payer: Self-pay | Admitting: Family Medicine

## 2014-12-08 ENCOUNTER — Other Ambulatory Visit: Payer: Self-pay | Admitting: Family Medicine

## 2014-12-19 ENCOUNTER — Ambulatory Visit: Payer: BLUE CROSS/BLUE SHIELD | Admitting: Family Medicine

## 2014-12-19 DIAGNOSIS — Z0289 Encounter for other administrative examinations: Secondary | ICD-10-CM

## 2014-12-25 ENCOUNTER — Telehealth: Payer: Self-pay | Admitting: Family Medicine

## 2014-12-25 NOTE — Telephone Encounter (Signed)
Pt was no show for appt on 12/19/14- called 12/20/14 and rescheduled. No letter sent- charge the pt?

## 2014-12-26 ENCOUNTER — Ambulatory Visit: Payer: BLUE CROSS/BLUE SHIELD | Admitting: Family Medicine

## 2014-12-26 NOTE — Telephone Encounter (Signed)
Yes charge 

## 2014-12-28 ENCOUNTER — Other Ambulatory Visit: Payer: Self-pay | Admitting: Family Medicine

## 2014-12-28 DIAGNOSIS — M549 Dorsalgia, unspecified: Secondary | ICD-10-CM

## 2014-12-28 DIAGNOSIS — M25562 Pain in left knee: Secondary | ICD-10-CM

## 2014-12-28 DIAGNOSIS — R1031 Right lower quadrant pain: Secondary | ICD-10-CM

## 2014-12-28 DIAGNOSIS — F419 Anxiety disorder, unspecified: Secondary | ICD-10-CM

## 2014-12-28 DIAGNOSIS — E1169 Type 2 diabetes mellitus with other specified complication: Secondary | ICD-10-CM

## 2014-12-28 DIAGNOSIS — E669 Obesity, unspecified: Secondary | ICD-10-CM

## 2014-12-28 DIAGNOSIS — G4700 Insomnia, unspecified: Secondary | ICD-10-CM

## 2014-12-28 DIAGNOSIS — E782 Mixed hyperlipidemia: Secondary | ICD-10-CM

## 2014-12-28 MED ORDER — HYDROCODONE-ACETAMINOPHEN 5-325 MG PO TABS: 1.0000 | ORAL_TABLET | Freq: Four times a day (QID) | ORAL | Status: DC | PRN

## 2014-12-28 MED ORDER — CYCLOBENZAPRINE HCL 10 MG PO TABS: 10.0000 mg | ORAL_TABLET | Freq: Two times a day (BID) | ORAL | Status: DC | PRN

## 2014-12-28 MED ORDER — PROMETHAZINE HCL 25 MG PO TABS: 25.0000 mg | ORAL_TABLET | Freq: Three times a day (TID) | ORAL | Status: DC | PRN

## 2014-12-28 MED ORDER — ALPRAZOLAM 0.5 MG PO TABS: 0.5000 mg | ORAL_TABLET | Freq: Three times a day (TID) | ORAL | Status: DC | PRN

## 2014-12-28 NOTE — Telephone Encounter (Signed)
Last Office Visit 09/12/14  Last refill on Alprazolam 11/14/14  #90  Last refill on Hydrocodone 11/14/14  #100 Last refill on promethazine  #20 with 2 refills on 10/14/13 Last refill on flexeril #60 with 1 refill on 05/20/13  Advise if ok to refill

## 2014-12-28 NOTE — Telephone Encounter (Signed)
Caller name: Burnetta Sabinishia Steidle Relationship to patient: self  Can be reached: [phone] [facility]acy[facility] [address]  Reason for call: Pt requesting refills.  ALPRAZolam (XANAX) 0.5 MG tablet, 2 days left, takes 1-3 day prn HYDROcodone-acetaminophen (NORCO/VICODIN) 5-325 MG per tablet, 2 days left, takes q6h prn cyclobenzaprine (FLEXERIL) 10 MG tablet, out, uses prn promethazine (PHENERGAN) 25 MG tablet, out, uses prn

## 2014-12-29 ENCOUNTER — Other Ambulatory Visit: Payer: Self-pay | Admitting: Family Medicine

## 2014-12-29 NOTE — Telephone Encounter (Signed)
Called the patient informed refills have been done but will have to pickup hardcopy for hydrocodone and Alprazolam at the front desk.  Left a detailed message

## 2015-01-09 ENCOUNTER — Ambulatory Visit: Payer: BLUE CROSS/BLUE SHIELD | Admitting: Family Medicine

## 2015-01-19 ENCOUNTER — Ambulatory Visit (INDEPENDENT_AMBULATORY_CARE_PROVIDER_SITE_OTHER): Payer: BLUE CROSS/BLUE SHIELD | Admitting: Family Medicine

## 2015-01-19 ENCOUNTER — Encounter: Payer: Self-pay | Admitting: Family Medicine

## 2015-01-19 VITALS — BP 120/84 | HR 103 | Temp 98.7°F | Ht 63.0 in | Wt 207.0 lb

## 2015-01-19 DIAGNOSIS — R1031 Right lower quadrant pain: Secondary | ICD-10-CM

## 2015-01-19 DIAGNOSIS — R000 Tachycardia, unspecified: Secondary | ICD-10-CM

## 2015-01-19 DIAGNOSIS — E1169 Type 2 diabetes mellitus with other specified complication: Secondary | ICD-10-CM

## 2015-01-19 DIAGNOSIS — R0789 Other chest pain: Secondary | ICD-10-CM | POA: Diagnosis not present

## 2015-01-19 DIAGNOSIS — G4700 Insomnia, unspecified: Secondary | ICD-10-CM | POA: Diagnosis not present

## 2015-01-19 DIAGNOSIS — G43809 Other migraine, not intractable, without status migrainosus: Secondary | ICD-10-CM

## 2015-01-19 DIAGNOSIS — E785 Hyperlipidemia, unspecified: Secondary | ICD-10-CM

## 2015-01-19 DIAGNOSIS — E119 Type 2 diabetes mellitus without complications: Secondary | ICD-10-CM | POA: Diagnosis not present

## 2015-01-19 DIAGNOSIS — E663 Overweight: Secondary | ICD-10-CM

## 2015-01-19 DIAGNOSIS — M25562 Pain in left knee: Secondary | ICD-10-CM | POA: Diagnosis not present

## 2015-01-19 DIAGNOSIS — Z1239 Encounter for other screening for malignant neoplasm of breast: Secondary | ICD-10-CM

## 2015-01-19 DIAGNOSIS — E782 Mixed hyperlipidemia: Secondary | ICD-10-CM

## 2015-01-19 DIAGNOSIS — K219 Gastro-esophageal reflux disease without esophagitis: Secondary | ICD-10-CM

## 2015-01-19 DIAGNOSIS — K449 Diaphragmatic hernia without obstruction or gangrene: Secondary | ICD-10-CM

## 2015-01-19 DIAGNOSIS — IMO0001 Reserved for inherently not codable concepts without codable children: Secondary | ICD-10-CM

## 2015-01-19 DIAGNOSIS — R030 Elevated blood-pressure reading, without diagnosis of hypertension: Secondary | ICD-10-CM

## 2015-01-19 DIAGNOSIS — R002 Palpitations: Secondary | ICD-10-CM

## 2015-01-19 DIAGNOSIS — F419 Anxiety disorder, unspecified: Secondary | ICD-10-CM | POA: Diagnosis not present

## 2015-01-19 DIAGNOSIS — E669 Obesity, unspecified: Secondary | ICD-10-CM

## 2015-01-19 MED ORDER — HYDROCODONE-ACETAMINOPHEN 5-325 MG PO TABS
1.0000 | ORAL_TABLET | Freq: Four times a day (QID) | ORAL | Status: DC | PRN
Start: 2015-01-19 — End: 2015-03-16

## 2015-01-19 MED ORDER — ALPRAZOLAM 0.5 MG PO TABS: 0.5000 mg | ORAL_TABLET | Freq: Three times a day (TID) | ORAL | Status: DC | PRN

## 2015-01-19 MED ORDER — METOPROLOL SUCCINATE ER 50 MG PO TB24: 50.0000 mg | ORAL_TABLET | Freq: Every day | ORAL | Status: DC

## 2015-01-19 MED ORDER — METOPROLOL SUCCINATE ER 25 MG PO TB24: 25.0000 mg | ORAL_TABLET | Freq: Every day | ORAL | Status: DC

## 2015-01-19 NOTE — Progress Notes (Signed)
Pre visit review using our clinic review tool, if applicable. No additional management support is needed unless otherwise documented below in the visit note. 

## 2015-01-19 NOTE — Progress Notes (Signed)
Nicole Ball  [id number] 12-22-1975 01/19/2015      Progress Note-Follow Up  Subjective  Chief Complaint  Chief Complaint  Patient presents with  . Follow-up    HPI  Patient is a 39 y.o. female in today for routine medical care. Patient is in today for follow-up. Is finished with her current degree and reports feeling somewhat better. Decreased headaches since graduation. Unfortunately she continues to struggle with episodes of palpitations roughly 4-5 times a week usually when she is feeling stressed. No associated symptoms at the same time but does have roughly 2 episodes of chest discomfort each week as well. She reports these episodes of chest discomfort are generally left-sided and relieved with belching. There are no associated symptoms with these episodes either. She denies shortness of breath, diaphoresis or nausea. No recent fevers or acute illness. Denies CP/palp/SOB/HA/congestion/fevers/GI or GU c/o. Taking meds as prescribed  Past Medical History  Diagnosis Date  . Allergy     seasonal  . Acute low back pain 12/19/2010  . Overweight(278.02) 10/16/2010  . SNORING 10/16/2010  . UTI'S, HX OF 10/16/2010  . INSOMNIA 10/16/2010  . HIATAL HERNIA WITH REFLUX, HX OF 10/16/2010  . ALLERGIC RHINITIS 10/16/2010  . Hyperlipidemia 04/21/2011  . Elevated BP 04/21/2011  . Sleep apnea 06/07/2011  . Anxiety and depression 06/07/2011  . Amenorrhea 07/11/2011  . Diabetes mellitus 10/30/2011  . Knee pain, left 03/11/2012  . Reflux 04/22/2012  . Acute sinusitis 08/06/2012  . Migraine 03/07/2013  . Cervical cancer screening 01/10/2014    Menarche at 8 Regular and moderate flow, became irregular until OCPs  history of abnormal pap in past, repeat was normal Last pap roughly 3 years ago G0P0, s/p No history of abnormal MGM, no h/o MGM  No concerns today no gyn surgeries LMP May 2013  . CTS (carpal tunnel syndrome) 01/15/2014  . Preventative health care 01/15/2014  . Abnormal serum level of alkaline  phosphatase 05/21/2014  . Diabetes mellitus type 2 in obese 10/30/2011    History reviewed. No pertinent past surgical history.  Family History  Problem Relation Age of Onset  . Lupus Mother   . Arthritis Maternal Grandmother   . Scleroderma Maternal Grandmother   . Glaucoma Maternal Grandmother   . Cataracts Maternal Grandmother   . Cancer Maternal Grandfather     prostate  . Coronary artery disease Maternal Grandfather     s/p angioplasty  . Diabetes Maternal Grandfather     Type 2  . Cancer Paternal Grandmother     Breast ca- dx in 75's  . Thyroid disease Paternal Grandmother   . Diabetes Paternal Grandfather   . Thyroid disease Sister   . Asthma Sister   . Allergies Brother   . Arthritis Other     History   Social History  . Marital Status: Married    Spouse Name: N/A  . Number of Children: N/A  . Years of Education: N/A   Occupational History  . Not on file.   Social History Main Topics  . Smoking status: Current Some Day Smoker -- 0.10 packs/day    Types: Cigars  . Smokeless tobacco: Never Used     Comment: 1 black and milds  . Alcohol Use: Yes     Comment: twice/year  . Drug Use: No  . Sexual Activity: Not on file   Other Topics Concern  . Not on file   Social History Narrative    Current Outpatient Prescriptions on File Prior to  Visit  Medication Sig Dispense Refill  . ALPRAZolam (XANAX) 0.5 MG tablet Take 1 tablet (0.5 mg total) by mouth 3 (three) times daily as needed for anxiety. 90 tablet 0  . amitriptyline (ELAVIL) 10 MG tablet Take 1-2 tablets (10-20 mg total) by mouth at bedtime as needed for sleep. 60 tablet 2  . BAYER MICROLET LANCETS lancets Use as directed to test daily and as needed for symptoms 100 each 1  . Blood Glucose Monitoring Suppl (CONTOUR BLOOD GLUCOSE SYSTEM) DEVI Use as directed to test daily and as needed for symptoms 1 Device 0  . cyclobenzaprine (FLEXERIL) 10 MG tablet Take 1 tablet (10 mg total) by mouth 2 (two) times  daily as needed for muscle spasms. 60 tablet 1  . fluticasone (FLONASE) 50 MCG/ACT nasal spray INHALE 2 SPRAYS IN EACH NOSTRIL ONCE DAILY 16 g 4  . glucose blood (BAYER CONTOUR TEST) test strip Use as directed to test daily and as needed for symptoms 100 each 5  . HYDROcodone-acetaminophen (NORCO/VICODIN) 5-325 MG per tablet Take 1 tablet by mouth every 6 (six) hours as needed. 100 tablet 0  . ibuprofen (ADVIL,MOTRIN) 800 MG tablet Take 1 tablet (800 mg total) by mouth 3 (three) times daily. 21 tablet 0  . meloxicam (MOBIC) 15 MG tablet TAKE 1 TABLET BY MOUTH DAILY 90 tablet 0  . metFORMIN (GLUCOPHAGE) 500 MG tablet Take 1 tablet (500 mg total) by mouth 3 (three) times daily. 90 tablet 3  . montelukast (SINGULAIR) 10 MG tablet TAKE 1 TABLET BY MOUTH EVERY NIGHT AT BEDTIME 30 tablet 6  . omeprazole (PRILOSEC) 20 MG capsule TAKE ONE CAPSULE BY MOUTH TWICE DAILY AS NEEDED FOR REFLUX 60 capsule 6  . promethazine (PHENERGAN) 25 MG tablet Take 1 tablet (25 mg total) by mouth every 8 (eight) hours as needed for nausea or vomiting. 20 tablet 2  . ranitidine (ZANTAC) 300 MG tablet TAKE 1 TABLET BY MOUTH AT BEDTIME 30 tablet 6  . Saline 0.65 % (SOLN) SOLN Place 2 Squirts into the nose 2 (two) times daily. 1 Bottle 0  . SUMAtriptan (IMITREX) 50 MG tablet TAKE 1 TABLET BY MOUTH EVERY 2 HOURS AS NEEDED FOR MIGRAINE - NO MORE THAN 2 PER 24 HOURS 10 tablet 2  . venlafaxine XR (EFFEXOR XR) 150 MG 24 hr capsule Take 1 capsule (150 mg total) by mouth daily with breakfast. 30 capsule 6  . fexofenadine (ALLEGRA) 180 MG tablet Take 1 tablet (180 mg total) by mouth daily as needed. 30 tablet 5   No current facility-administered medications on file prior to visit.    No Known Allergies  Review of Systems  Review of Systems  Constitutional: Negative for fever and malaise/fatigue.  HENT: Negative for congestion.   Eyes: Negative for discharge.  Respiratory: Negative for shortness of breath.   Cardiovascular:  Positive for chest pain and palpitations. Negative for leg swelling.  Gastrointestinal: Positive for heartburn. Negative for nausea, abdominal pain and diarrhea.  Genitourinary: Negative for dysuria.  Musculoskeletal: Positive for back pain. Negative for falls.  Skin: Negative for rash.  Neurological: Positive for headaches. Negative for loss of consciousness.  Endo/Heme/Allergies: Negative for polydipsia.  Psychiatric/Behavioral: Positive for depression. Negative for suicidal ideas. The patient is nervous/anxious. The patient does not have insomnia.     Objective  BP 120/84 mmHg  Pulse 103  Temp(Src) 98.7 F (37.1 C) (Oral)  Ht  (1.6 m)  Wt 207 lb (93.895 kg)  BMI 36.68 kg/m2  SpO2  95%  LMP 12/10/2012  Physical Exam  Physical Exam  Constitutional: She is oriented to person, place, and time and well-developed, well-nourished, and in no distress. No distress.  HENT:  Head: Normocephalic and atraumatic.  Eyes: Conjunctivae are normal.  Neck: Neck supple. No thyromegaly present.  Cardiovascular: Normal rate, regular rhythm and normal heart sounds.   No murmur heard. Pulmonary/Chest: Effort normal and breath sounds normal. She has no wheezes.  Abdominal: She exhibits no distension and no mass.  Musculoskeletal: She exhibits no edema.  Lymphadenopathy:    She has no cervical adenopathy.  Neurological: She is alert and oriented to person, place, and time.  Skin: Skin is warm and dry. No rash noted. She is not diaphoretic.  Psychiatric: Memory, affect and judgment normal.    Lab Results  Component Value Date   TSH 1.43 09/12/2014   Lab Results  Component Value Date   WBC 7.1 09/12/2014   HGB 12.4 09/12/2014   HCT 37.8 09/12/2014   MCV 85.7 09/12/2014   PLT 206.0 09/12/2014   Lab Results  Component Value Date   CREATININE 0.69 10/17/2014   BUN 11 10/17/2014   NA 135 10/17/2014   K 4.0 10/17/2014   CL 105 10/17/2014   CO2 25 10/17/2014   Lab Results    Component Value Date   ALT 16 10/17/2014   AST 14 10/17/2014   ALKPHOS 133* 10/17/2014   BILITOT 0.2 10/17/2014   Lab Results  Component Value Date   CHOL 175 09/12/2014   Lab Results  Component Value Date   HDL 67.60 09/12/2014   Lab Results  Component Value Date   LDLCALC 79 09/12/2014   Lab Results  Component Value Date   TRIG 142.0 09/12/2014   Lab Results  Component Value Date   CHOLHDL 3 09/12/2014     Assessment & Plan  Migraine Encouraged increased hydration, 64 ounces of clear fluids daily. Minimize alcohol and caffeine. Eat small frequent meals with lean proteins and complex carbs. Avoid high and low blood sugars. Get adequate sleep, 7-8 hours a night. Needs exercise daily preferably in the morning.  Hiatal hernia with gastroesophageal reflux Avoid offending foods, start probiotics. Do not eat large meals in late evening and consider raising head of bed.   Diabetes mellitus type 2 in obese hgba1c acceptable, minimize simple carbs. Increase exercise as tolerated. Continue current meds  Overweight Encouraged DASH diet, decrease po intake and increase exercise as tolerated. Needs 7-8 hours of sleep nightly. Avoid trans fats, eat small, frequent meals every 4-5 hours with lean proteins, complex carbs and healthy fats. Minimize simple carbs  Hyperlipidemia Encouraged heart healthy diet, increase exercise, avoid trans fats, consider a krill oil cap daily  Palpitations 4-5 episodes each week, she reports usually when she is feeling stressed, no associated symptoms although she has had separate episodes of chest pain, she describes pressure that is relieved by belching. Likely related to reflux but with risk factors will refer to cardiology for further testing to rule out any cardiac concerns.  Elevated BP Well controlled. Encouraged heart healthy diet such as the DASH diet and exercise as tolerated. EKG WNL

## 2015-01-25 ENCOUNTER — Encounter: Payer: Self-pay | Admitting: Cardiology

## 2015-01-28 ENCOUNTER — Encounter: Payer: Self-pay | Admitting: Family Medicine

## 2015-01-28 DIAGNOSIS — R002 Palpitations: Secondary | ICD-10-CM

## 2015-01-28 HISTORY — DX: Palpitations: R00.2

## 2015-01-28 NOTE — Assessment & Plan Note (Signed)
Encouraged heart healthy diet, increase exercise, avoid trans fats, consider a krill oil cap daily 

## 2015-01-28 NOTE — Assessment & Plan Note (Signed)
Encouraged increased hydration, 64 ounces of clear fluids daily. Minimize alcohol and caffeine. Eat small frequent meals with lean proteins and complex carbs. Avoid high and low blood sugars. Get adequate sleep, 7-8 hours a night. Needs exercise daily preferably in the morning.  

## 2015-01-28 NOTE — Assessment & Plan Note (Signed)
Avoid offending foods, start probiotics. Do not eat large meals in late evening and consider raising head of bed.  

## 2015-01-28 NOTE — Assessment & Plan Note (Signed)
4-5 episodes each week, she reports usually when she is feeling stressed, no associated symptoms although she has had separate episodes of chest pain, she describes pressure that is relieved by belching. Likely related to reflux but with risk factors will refer to cardiology for further testing to rule out any cardiac concerns.

## 2015-01-28 NOTE — Assessment & Plan Note (Signed)
hgba1c acceptable, minimize simple carbs. Increase exercise as tolerated. Continue current meds 

## 2015-01-28 NOTE — Assessment & Plan Note (Signed)
Well controlled. Encouraged heart healthy diet such as the DASH diet and exercise as tolerated. EKG WNL

## 2015-01-28 NOTE — Assessment & Plan Note (Signed)
Encouraged DASH diet, decrease po intake and increase exercise as tolerated. Needs 7-8 hours of sleep nightly. Avoid trans fats, eat small, frequent meals every 4-5 hours with lean proteins, complex carbs and healthy fats. Minimize simple carbs 

## 2015-02-01 ENCOUNTER — Telehealth: Payer: Self-pay | Admitting: Family Medicine

## 2015-02-01 DIAGNOSIS — E782 Mixed hyperlipidemia: Secondary | ICD-10-CM

## 2015-02-01 DIAGNOSIS — E119 Type 2 diabetes mellitus without complications: Secondary | ICD-10-CM

## 2015-02-01 NOTE — Telephone Encounter (Signed)
Caller name: selena Relation to pt: self Call back number: [phone] Pharmacy:  Reason for call:   Patient states that she was supposed to have labs done a few weeks back but I do not see any orders. Also, patient states that her bp has been running high and would like to know what she should do? Patient thinks that it is her machine though and wants to know if we could provide her with a machine to check bp   Patient states that she does feel better with the higher dose of metoprolol. It is working better.

## 2015-02-01 NOTE — Telephone Encounter (Signed)
I do not have a bp monitor to give her and her insurance will not cover it. She can come in here for a bp check and she should minimize sodium.

## 2015-02-02 NOTE — Telephone Encounter (Signed)
Got it. Lipid, cmp, cbc, tsh, hgba1c for HTN, diabetes 2 obes, hyperlipidemia, mixed

## 2015-02-02 NOTE — Telephone Encounter (Signed)
Labs entered in computer.  Called the patient left a detailed message ok to schedule lab appt. Along with nurse visit for BP check.

## 2015-02-02 NOTE — Telephone Encounter (Signed)
Patient informed of PCP instructions.  She will call back later this am to schedule nurse visit appt. For BP. BUT, the main reason for her call was labs were to be ordered (she thought) to be done one week or so after her last appt. With you, if she is correct please let me know I can order and she can coordinate lab appt/ with nurse visit appt. To check BP.

## 2015-02-08 ENCOUNTER — Other Ambulatory Visit: Payer: Self-pay | Admitting: Family Medicine

## 2015-02-08 MED ORDER — AMITRIPTYLINE HCL 10 MG PO TABS: 10.0000 mg | ORAL_TABLET | Freq: Every evening | ORAL | Status: DC | PRN

## 2015-02-09 ENCOUNTER — Ambulatory Visit: Payer: BLUE CROSS/BLUE SHIELD | Admitting: Family Medicine

## 2015-02-13 ENCOUNTER — Telehealth: Payer: Self-pay | Admitting: Family Medicine

## 2015-02-13 NOTE — Telephone Encounter (Signed)
No charge, her husband was in the the hospital

## 2015-02-13 NOTE — Telephone Encounter (Signed)
Pt was no show 02/09/15 8:15am, she called in and rescheduled for labs 02/15/15. Pt states her husband was in the hospital and he has hospital f/u with you 02/15/15. She will be with him for that appt. She apologized for missing the appt. Charge no show?

## 2015-02-15 ENCOUNTER — Other Ambulatory Visit (INDEPENDENT_AMBULATORY_CARE_PROVIDER_SITE_OTHER): Payer: BLUE CROSS/BLUE SHIELD

## 2015-02-15 DIAGNOSIS — E782 Mixed hyperlipidemia: Secondary | ICD-10-CM

## 2015-02-15 DIAGNOSIS — E119 Type 2 diabetes mellitus without complications: Secondary | ICD-10-CM

## 2015-02-16 LAB — CBC WITH DIFFERENTIAL/PLATELET
BASOS PCT: 0.7 % (ref 0.0–3.0)
Basophils Absolute: 0 10*3/uL (ref 0.0–0.1)
Eosinophils Absolute: 0.1 10*3/uL (ref 0.0–0.7)
Eosinophils Relative: 2.5 % (ref 0.0–5.0)
HCT: 38.4 % (ref 36.0–46.0)
Hemoglobin: 12.8 g/dL (ref 12.0–15.0)
LYMPHS ABS: 2.4 10*3/uL (ref 0.7–4.0)
Lymphocytes Relative: 43.6 % (ref 12.0–46.0)
MCHC: 33.4 g/dL (ref 30.0–36.0)
MCV: 86.2 fl (ref 78.0–100.0)
Monocytes Absolute: 0.3 10*3/uL (ref 0.1–1.0)
Monocytes Relative: 6.2 % (ref 3.0–12.0)
NEUTROS PCT: 47 % (ref 43.0–77.0)
Neutro Abs: 2.6 10*3/uL (ref 1.4–7.7)
PLATELETS: 215 10*3/uL (ref 150.0–400.0)
RBC: 4.46 Mil/uL (ref 3.87–5.11)
RDW: 15.6 % — AB (ref 11.5–15.5)
WBC: 5.5 10*3/uL (ref 4.0–10.5)

## 2015-02-16 LAB — COMPREHENSIVE METABOLIC PANEL
ALT: 20 U/L (ref 0–35)
AST: 16 U/L (ref 0–37)
Albumin: 4 g/dL (ref 3.5–5.2)
Alkaline Phosphatase: 129 U/L — ABNORMAL HIGH (ref 39–117)
BUN: 10 mg/dL (ref 6–23)
CALCIUM: 9.8 mg/dL (ref 8.4–10.5)
CO2: 27 meq/L (ref 19–32)
Chloride: 103 mEq/L (ref 96–112)
Creatinine, Ser: 0.74 mg/dL (ref 0.40–1.20)
GFR: 111.39 mL/min (ref >60.00)
Glucose, Bld: 92 mg/dL (ref 70–99)
POTASSIUM: 3.6 meq/L (ref 3.5–5.1)
SODIUM: 138 meq/L (ref 135–145)
TOTAL PROTEIN: 7.3 g/dL (ref 6.0–8.3)
Total Bilirubin: 0.3 mg/dL (ref 0.2–1.2)

## 2015-02-16 LAB — LIPID PANEL
CHOL/HDL RATIO: 3
Cholesterol: 169 mg/dL (ref 0–200)
HDL: 51 mg/dL (ref >39.00)
LDL Cholesterol: 89 mg/dL (ref 0–99)
NonHDL: 118
Triglycerides: 143 mg/dL (ref 0.0–149.0)
VLDL: 28.6 mg/dL (ref 0.0–40.0)

## 2015-02-16 LAB — HEMOGLOBIN A1C: HEMOGLOBIN A1C: 7.1 % — AB (ref 4.6–6.5)

## 2015-02-16 LAB — TSH: TSH: 1 u[IU]/mL (ref 0.35–4.50)

## 2015-03-08 ENCOUNTER — Encounter: Payer: Self-pay | Admitting: Family Medicine

## 2015-03-08 ENCOUNTER — Ambulatory Visit (INDEPENDENT_AMBULATORY_CARE_PROVIDER_SITE_OTHER): Payer: BLUE CROSS/BLUE SHIELD | Admitting: Family Medicine

## 2015-03-08 VITALS — BP 130/90 | HR 78 | Temp 98.1°F | Ht 63.0 in | Wt 206.5 lb

## 2015-03-08 DIAGNOSIS — F418 Other specified anxiety disorders: Secondary | ICD-10-CM

## 2015-03-08 DIAGNOSIS — K449 Diaphragmatic hernia without obstruction or gangrene: Secondary | ICD-10-CM

## 2015-03-08 DIAGNOSIS — R1013 Epigastric pain: Secondary | ICD-10-CM

## 2015-03-08 DIAGNOSIS — F419 Anxiety disorder, unspecified: Secondary | ICD-10-CM

## 2015-03-08 DIAGNOSIS — E663 Overweight: Secondary | ICD-10-CM

## 2015-03-08 DIAGNOSIS — K219 Gastro-esophageal reflux disease without esophagitis: Secondary | ICD-10-CM

## 2015-03-08 DIAGNOSIS — F32A Depression, unspecified: Secondary | ICD-10-CM

## 2015-03-08 DIAGNOSIS — E785 Hyperlipidemia, unspecified: Secondary | ICD-10-CM

## 2015-03-08 DIAGNOSIS — F329 Major depressive disorder, single episode, unspecified: Secondary | ICD-10-CM

## 2015-03-08 MED ORDER — VENLAFAXINE HCL ER 37.5 MG PO CP24: 37.5000 mg | ORAL_CAPSULE | Freq: Every day | ORAL | Status: DC

## 2015-03-08 NOTE — Progress Notes (Signed)
Pre visit review using our clinic review tool, if applicable. No additional management support is needed unless otherwise documented below in the visit note. 

## 2015-03-08 NOTE — Patient Instructions (Addendum)
mylanta as needed for epigastric pain NOW company 10 strain probiotic daily, order online at [email or web address]  Cholelithiasis Cholelithiasis (also called gallstones) is a form of gallbladder disease in which gallstones form in your gallbladder. The gallbladder is an organ that stores bile made in the liver, which helps digest fats. Gallstones begin as small crystals and slowly grow into stones. Gallstone pain occurs when the gallbladder spasms and a gallstone is blocking the duct. Pain can also occur when a stone passes out of the duct.  RISK FACTORS  Being female.   Having multiple pregnancies. Health care providers sometimes advise removing diseased gallbladders before future pregnancies.   Being obese.  Eating a diet heavy in fried foods and fat.   Being older than 60 years and increasing age.   Prolonged use of medicines containing female hormones.   Having diabetes mellitus.   Rapidly losing weight.   Having a family history of gallstones (heredity).  SYMPTOMS  Nausea.   Vomiting.  Abdominal pain.   Yellowing of the skin (jaundice).   Sudden pain. It may persist from several minutes to several hours.  Fever.   Tenderness to the touch. In some cases, when gallstones do not move into the bile duct, people have no pain or symptoms. These are called "silent" gallstones.  TREATMENT Silent gallstones do not need treatment. In severe cases, emergency surgery may be required. Options for treatment include:  Surgery to remove the gallbladder. This is the most common treatment.  Medicines. These do not always work and may take 6-12 months or more to work.  Shock wave treatment (extracorporeal biliary lithotripsy). In this treatment an ultrasound machine sends shock waves to the gallbladder to break gallstones into smaller pieces that can pass into the intestines or be dissolved by medicine. HOME CARE INSTRUCTIONS   Only take over-the-counter or prescription  medicines for pain, discomfort, or fever as directed by your health care provider.   Follow a low-fat diet until seen again by your health care provider. Fat causes the gallbladder to contract, which can result in pain.   Follow up with your health care provider as directed. Attacks are almost always recurrent and surgery is usually required for permanent treatment.  SEEK IMMEDIATE MEDICAL CARE IF:   Your pain increases and is not controlled by medicines.   You have a fever or persistent symptoms for more than 2-3 days.   You have a fever and your symptoms suddenly get worse.   You have persistent nausea and vomiting.  MAKE SURE YOU:   Understand these instructions.  Will watch your condition.  Will get help right away if you are not doing well or get worse. Document Released: 07/24/2005 Document Revised: 03/30/2013 Document Reviewed: 01/19/2013 [facility] Patient Information 2015 [place], [place]. This information is not intended to replace advice given to you by your health care provider. Make sure you discuss any questions you have with your health care provider.

## 2015-03-13 ENCOUNTER — Ambulatory Visit (HOSPITAL_BASED_OUTPATIENT_CLINIC_OR_DEPARTMENT_OTHER)
Admission: RE | Admit: 2015-03-13 | Discharge: 2015-03-13 | Disposition: A | Discharge status: Home / Self Care | Payer: BLUE CROSS/BLUE SHIELD | Source: Ambulatory Visit | Attending: Family Medicine | Admitting: Family Medicine

## 2015-03-13 DIAGNOSIS — R1013 Epigastric pain: Secondary | ICD-10-CM

## 2015-03-13 DIAGNOSIS — K8020 Calculus of gallbladder without cholecystitis without obstruction: Secondary | ICD-10-CM | POA: Insufficient documentation

## 2015-03-14 ENCOUNTER — Other Ambulatory Visit: Payer: Self-pay | Admitting: Family Medicine

## 2015-03-14 ENCOUNTER — Telehealth: Payer: Self-pay

## 2015-03-14 DIAGNOSIS — K760 Fatty (change of) liver, not elsewhere classified: Secondary | ICD-10-CM

## 2015-03-14 DIAGNOSIS — K8020 Calculus of gallbladder without cholecystitis without obstruction: Secondary | ICD-10-CM

## 2015-03-14 NOTE — Telephone Encounter (Signed)
Pt was given 3 mo supply of metformin 1 tab 3 times a day 10/06/14 Last A1C 7.1on 02/15/15 Dr. Abner Greenspan states labs would be discussed at visit but no changes made. Please advise.

## 2015-03-14 NOTE — Telephone Encounter (Signed)
-----   Message from [name], MD sent at 03/13/2015 11:42 PM EDT ----- Notify us shows gallstones and fatty liver, I will refer her to general surgery for consideration of surgery if she is willling

## 2015-03-14 NOTE — Telephone Encounter (Signed)
Pt states she will consult with general surgery. No further questions at this time. General Surgery consult placed

## 2015-03-16 ENCOUNTER — Other Ambulatory Visit: Payer: Self-pay | Admitting: Family Medicine

## 2015-03-16 DIAGNOSIS — E1169 Type 2 diabetes mellitus with other specified complication: Secondary | ICD-10-CM

## 2015-03-16 DIAGNOSIS — F419 Anxiety disorder, unspecified: Secondary | ICD-10-CM

## 2015-03-16 DIAGNOSIS — R1031 Right lower quadrant pain: Secondary | ICD-10-CM

## 2015-03-16 DIAGNOSIS — E782 Mixed hyperlipidemia: Secondary | ICD-10-CM

## 2015-03-16 DIAGNOSIS — G4700 Insomnia, unspecified: Secondary | ICD-10-CM

## 2015-03-16 DIAGNOSIS — E669 Obesity, unspecified: Secondary | ICD-10-CM

## 2015-03-16 DIAGNOSIS — M25562 Pain in left knee: Secondary | ICD-10-CM

## 2015-03-16 MED ORDER — HYDROCODONE-ACETAMINOPHEN 5-325 MG PO TABS: 1.0000 | ORAL_TABLET | Freq: Four times a day (QID) | ORAL | Status: DC | PRN

## 2015-03-16 NOTE — Telephone Encounter (Signed)
Requesting: Hydrocodone Contract 08/18/14 UDS done Low Risk Last OV  03/08/15 Last Refill  #100 with 0 refills on 01/19/15  Please Advise

## 2015-03-16 NOTE — Telephone Encounter (Signed)
Caller name:Saggese, Hani Relation to ZO:XWRU Call back number:[phone] Pharmacy:  Reason for call: pt is needing rx  HYDROcodone-acetaminophen (NORCO/VICODIN) 5-325 MG per tablet , please call when available for pick up, pt states she has 5 pills left.

## 2015-03-18 NOTE — Assessment & Plan Note (Signed)
Encouraged good sleep hygiene such as dark, quiet room. No blue/green glowing lights such as computer screens in bedroom. No alcohol or stimulants in evening. Cut down on caffeine as able. Regular exercise is helpful but not just prior to bed time. May use Amitriptyline prn

## 2015-03-18 NOTE — Assessment & Plan Note (Signed)
Avoid offending foods, start probiotics. Do not eat large meals in late evening and consider raising head of bed.  

## 2015-03-18 NOTE — Assessment & Plan Note (Signed)
Encouraged heart healthy diet, increase exercise, avoid trans fats, consider a krill oil cap daily 

## 2015-03-18 NOTE — Progress Notes (Signed)
GLENDA SPELMAN  [id number] 11-Jan-1976 03/18/2015      Progress Note-Follow Up  Subjective  Chief Complaint  Chief Complaint  Patient presents with  . Follow-up    HPI  Patient is a 39 y.o. female in today for routine medical care. Patient is in today for follow-up and she is under a great deal of stress has been hospitalized numerous times and is in need of cardiac surgery. She struggles with low mood, anxiety and insomnia. No recent illness. No acute complaints otherwise. Denies CP/palp/SOB/HA/congestion/fevers/GI or GU c/o. Taking meds as prescribed  Past Medical History  Diagnosis Date  . Allergy     seasonal  . Acute low back pain 12/19/2010  . Overweight(278.02) 10/16/2010  . SNORING 10/16/2010  . UTI'S, HX OF 10/16/2010  . INSOMNIA 10/16/2010  . HIATAL HERNIA WITH REFLUX, HX OF 10/16/2010  . ALLERGIC RHINITIS 10/16/2010  . Hyperlipidemia 04/21/2011  . Elevated BP 04/21/2011  . Sleep apnea 06/07/2011  . Anxiety and depression 06/07/2011  . Amenorrhea 07/11/2011  . Diabetes mellitus 10/30/2011  . Knee pain, left 03/11/2012  . Reflux 04/22/2012  . Acute sinusitis 08/06/2012  . Migraine 03/07/2013  . Cervical cancer screening 01/10/2014    Menarche at 8 Regular and moderate flow, became irregular until OCPs  history of abnormal pap in past, repeat was normal Last pap roughly 3 years ago G0P0, s/p No history of abnormal MGM, no h/o MGM  No concerns today no gyn surgeries LMP May 2013  . CTS (carpal tunnel syndrome) 01/15/2014  . Preventative health care 01/15/2014  . Abnormal serum level of alkaline phosphatase 05/21/2014  . Diabetes mellitus type 2 in obese 10/30/2011  . Palpitations 01/28/2015    No past surgical history on file.  Family History  Problem Relation Age of Onset  . Lupus Mother   . Arthritis Maternal Grandmother   . Scleroderma Maternal Grandmother   . Glaucoma Maternal Grandmother   . Cataracts Maternal Grandmother   . Cancer Maternal Grandfather     prostate  .  Coronary artery disease Maternal Grandfather     s/p angioplasty  . Diabetes Maternal Grandfather     Type 2  . Cancer Paternal Grandmother     Breast ca- dx in 10's  . Thyroid disease Paternal Grandmother   . Diabetes Paternal Grandfather   . Thyroid disease Sister   . Asthma Sister   . Allergies Brother   . Arthritis Other     History   Social History  . Marital Status: Married    Spouse Name: N/A  . Number of Children: N/A  . Years of Education: N/A   Occupational History  . Not on file.   Social History Main Topics  . Smoking status: Current Some Day Smoker -- 0.10 packs/day    Types: Cigars  . Smokeless tobacco: Never Used     Comment: 1 black and milds  . Alcohol Use: Yes     Comment: twice/year  . Drug Use: No  . Sexual Activity: Not on file   Other Topics Concern  . Not on file   Social History Narrative    Current Outpatient Prescriptions on File Prior to Visit  Medication Sig Dispense Refill  . ALPRAZolam (XANAX) 0.5 MG tablet Take 1 tablet (0.5 mg total) by mouth 3 (three) times daily as needed for anxiety. 90 tablet 1  . amitriptyline (ELAVIL) 10 MG tablet Take 1-2 tablets (10-20 mg total) by mouth at bedtime as needed for sleep.  60 tablet 2  . BAYER MICROLET LANCETS lancets Use as directed to test daily and as needed for symptoms 100 each 1  . Blood Glucose Monitoring Suppl (CONTOUR BLOOD GLUCOSE SYSTEM) DEVI Use as directed to test daily and as needed for symptoms 1 Device 0  . cyclobenzaprine (FLEXERIL) 10 MG tablet Take 1 tablet (10 mg total) by mouth 2 (two) times daily as needed for muscle spasms. 60 tablet 1  . fluticasone (FLONASE) 50 MCG/ACT nasal spray INHALE 2 SPRAYS IN EACH NOSTRIL ONCE DAILY 16 g 4  . glucose blood (BAYER CONTOUR TEST) test strip Use as directed to test daily and as needed for symptoms 100 each 5  . ibuprofen (ADVIL,MOTRIN) 800 MG tablet Take 1 tablet (800 mg total) by mouth 3 (three) times daily. 21 tablet 0  . meloxicam  (MOBIC) 15 MG tablet TAKE 1 TABLET BY MOUTH DAILY 90 tablet 0  . metoprolol succinate (TOPROL XL) 50 MG 24 hr tablet Take 1 tablet (50 mg total) by mouth daily. Take with or immediately following a meal. 30 tablet 2  . metoprolol succinate (TOPROL-XL) 25 MG 24 hr tablet Take 1 tablet (25 mg total) by mouth daily. 30 tablet 8  . montelukast (SINGULAIR) 10 MG tablet TAKE 1 TABLET BY MOUTH EVERY NIGHT AT BEDTIME 30 tablet 6  . omeprazole (PRILOSEC) 20 MG capsule TAKE ONE CAPSULE BY MOUTH TWICE DAILY AS NEEDED FOR REFLUX 60 capsule 6  . promethazine (PHENERGAN) 25 MG tablet Take 1 tablet (25 mg total) by mouth every 8 (eight) hours as needed for nausea or vomiting. 20 tablet 2  . ranitidine (ZANTAC) 300 MG tablet TAKE 1 TABLET BY MOUTH AT BEDTIME 30 tablet 6  . Saline 0.65 % (SOLN) SOLN Place 2 Squirts into the nose 2 (two) times daily. 1 Bottle 0  . SUMAtriptan (IMITREX) 50 MG tablet TAKE 1 TABLET BY MOUTH EVERY 2 HOURS AS NEEDED FOR MIGRAINE - NO MORE THAN 2 PER 24 HOURS 10 tablet 2  . fexofenadine (ALLEGRA) 180 MG tablet Take 1 tablet (180 mg total) by mouth daily as needed. 30 tablet 5   No current facility-administered medications on file prior to visit.    No Known Allergies  Review of Systems  Review of Systems  Constitutional: Negative for fever and malaise/fatigue.  HENT: Negative for congestion.   Eyes: Negative for discharge.  Respiratory: Negative for shortness of breath.   Cardiovascular: Negative for chest pain, palpitations and leg swelling.  Gastrointestinal: Negative for nausea, abdominal pain and diarrhea.  Genitourinary: Negative for dysuria.  Musculoskeletal: Positive for back pain. Negative for falls.  Skin: Negative for rash.  Neurological: Negative for loss of consciousness and headaches.  Endo/Heme/Allergies: Negative for polydipsia.  Psychiatric/Behavioral: Positive for depression. Negative for suicidal ideas. The patient is nervous/anxious and has insomnia.      Objective  BP 130/90 mmHg  Pulse 78  Temp(Src) 98.1 F (36.7 C) (Oral)  Ht 5\' 3"  (1.6 m)  Wt 206 lb 8 oz (93.668 kg)  BMI 36.59 kg/m2  SpO2 96%  LMP 12/10/2012  Physical Exam  Physical Exam  Constitutional: She is oriented to person, place, and time and well-developed, well-nourished, and in no distress. No distress.  HENT:  Head: Normocephalic and atraumatic.  Eyes: Conjunctivae are normal.  Neck: Neck supple. No thyromegaly present.  Cardiovascular: Normal rate, regular rhythm and normal heart sounds.   No murmur heard. Pulmonary/Chest: Effort normal and breath sounds normal. She has no wheezes.  Abdominal: She  exhibits no distension and no mass.  Musculoskeletal: She exhibits no edema.  Lymphadenopathy:    She has no cervical adenopathy.  Neurological: She is alert and oriented to person, place, and time.  Skin: Skin is warm and dry. No rash noted. She is not diaphoretic.  Psychiatric: Memory, affect and judgment normal.    Lab Results  Component Value Date   TSH 1.00 02/15/2015   Lab Results  Component Value Date   WBC 5.5 02/15/2015   HGB 12.8 02/15/2015   HCT 38.4 02/15/2015   MCV 86.2 02/15/2015   PLT 215.0 02/15/2015   Lab Results  Component Value Date   CREATININE 0.74 02/15/2015   BUN 10 02/15/2015   NA 138 02/15/2015   K 3.6 02/15/2015   CL 103 02/15/2015   CO2 27 02/15/2015   Lab Results  Component Value Date   ALT 20 02/15/2015   AST 16 02/15/2015   ALKPHOS 129* 02/15/2015   BILITOT 0.3 02/15/2015   Lab Results  Component Value Date   CHOL 169 02/15/2015   Lab Results  Component Value Date   HDL 51.00 02/15/2015   Lab Results  Component Value Date   LDLCALC 89 02/15/2015   Lab Results  Component Value Date   TRIG 143.0 02/15/2015   Lab Results  Component Value Date   CHOLHDL 3 02/15/2015     Assessment & Plan  Overweight Encouraged DASH diet, decrease po intake and increase exercise as tolerated. Needs 7-8 hours  of sleep nightly. Avoid trans fats, eat small, frequent meals every 4-5 hours with lean proteins, complex carbs and healthy fats. Minimize simple carbs, GMO foods.  Hyperlipidemia Encouraged heart healthy diet, increase exercise, avoid trans fats, consider a krill oil cap daily  Anxiety and depression Under a great deal of stress will try Venlafaxine XR 37.5 and titrate up to 75 mg as tolerated.  Hiatal hernia with gastroesophageal reflux Avoid offending foods, start probiotics. Do not eat large meals in late evening and consider raising head of bed.   INSOMNIA Encouraged good sleep hygiene such as dark, quiet room. No blue/green glowing lights such as computer screens in bedroom. No alcohol or stimulants in evening. Cut down on caffeine as able. Regular exercise is helpful but not just prior to bed time. May use Amitriptyline prn

## 2015-03-18 NOTE — Assessment & Plan Note (Signed)
Encouraged DASH diet, decrease po intake and increase exercise as tolerated. Needs 7-8 hours of sleep nightly. Avoid trans fats, eat small, frequent meals every 4-5 hours with lean proteins, complex carbs and healthy fats. Minimize simple carbs, GMO foods. 

## 2015-03-18 NOTE — Assessment & Plan Note (Addendum)
Under a great deal of stress will try Venlafaxine XR 37.5 and titrate up to 75 mg as tolerated.

## 2015-03-19 NOTE — Telephone Encounter (Signed)
Called the patient left a detailed message that hardcopy for Hydrocodone is ready for pickup at the front desk.

## 2015-03-22 ENCOUNTER — Other Ambulatory Visit: Payer: Self-pay | Admitting: Family Medicine

## 2015-03-22 MED ORDER — METFORMIN HCL 500 MG PO TABS: 500.0000 mg | ORAL_TABLET | Freq: Three times a day (TID) | ORAL | Status: DC

## 2015-03-30 ENCOUNTER — Other Ambulatory Visit: Payer: Self-pay | Admitting: Family Medicine

## 2015-03-30 ENCOUNTER — Ambulatory Visit (INDEPENDENT_AMBULATORY_CARE_PROVIDER_SITE_OTHER): Payer: Self-pay | Admitting: Cardiology

## 2015-03-30 ENCOUNTER — Encounter: Payer: Self-pay | Admitting: Cardiology

## 2015-03-30 VITALS — BP 124/76 | HR 106 | Ht 63.0 in | Wt 208.0 lb

## 2015-03-30 DIAGNOSIS — R0600 Dyspnea, unspecified: Secondary | ICD-10-CM

## 2015-03-30 DIAGNOSIS — R002 Palpitations: Secondary | ICD-10-CM

## 2015-03-30 DIAGNOSIS — R0609 Other forms of dyspnea: Secondary | ICD-10-CM

## 2015-03-30 DIAGNOSIS — R079 Chest pain, unspecified: Secondary | ICD-10-CM

## 2015-03-30 DIAGNOSIS — I10 Essential (primary) hypertension: Secondary | ICD-10-CM

## 2015-03-30 DIAGNOSIS — E785 Hyperlipidemia, unspecified: Secondary | ICD-10-CM

## 2015-03-30 MED ORDER — ATORVASTATIN CALCIUM 10 MG PO TABS: 10.0000 mg | ORAL_TABLET | Freq: Every day | ORAL | Status: DC

## 2015-03-30 NOTE — Progress Notes (Signed)
Patient ID: Nicole Ball, female   DOB: [birth year], 39 y.o.   MRN: [id number]      Cardiology Office Note  Date:  03/30/2015   ID:  Nicole Ball, DOB [birth year], MRN [id number]  PCP:  Danise Edge, MD  Cardiologist:  Lars Masson, MD   Chief complain: Palpitations chest pain and DOE   History of Present Illness: Nicole Ball is a 39 y.o. female with h/o DM 2 (for the last 2-3 years with PCOS and early menopause), HTN, anxiety who has noticed palpitations and tachycardia in March 2016, she was started on metoprolol and her symptoms improved however she continues to be tachycardic. They are very uncomfortable, but no dizziness or syncope.  She was recently diagnosed with fatty liver and gallbladder stone and she is supposed to see a surgeon the next week.  She is undergoing significant stress with her husband who is also our patient requires myectomy for HCM. She has noticed worsening DOE. She also has non-exertional chest pain mostly after food that is most probably related to gallstones. No syncope.  H/O MI in grandparents.   Past Medical History  Diagnosis Date  . Allergy     seasonal  . Acute low back pain 12/19/2010  . Overweight(278.02) 10/16/2010  . SNORING 10/16/2010  . UTI'S, HX OF 10/16/2010  . INSOMNIA 10/16/2010  . HIATAL HERNIA WITH REFLUX, HX OF 10/16/2010  . ALLERGIC RHINITIS 10/16/2010  . Hyperlipidemia 04/21/2011  . Elevated BP 04/21/2011  . Sleep apnea 06/07/2011  . Anxiety and depression 06/07/2011  . Amenorrhea 07/11/2011  . Diabetes mellitus 10/30/2011  . Knee pain, left 03/11/2012  . Reflux 04/22/2012  . Acute sinusitis 08/06/2012  . Migraine 03/07/2013  . Cervical cancer screening 01/10/2014    Menarche at 8 Regular and moderate flow, became irregular until OCPs  history of abnormal pap in past, repeat was normal Last pap roughly 3 years ago G0P0, s/p No history of abnormal MGM, no h/o MGM  No concerns today no gyn surgeries LMP May 2013  . CTS (carpal  tunnel syndrome) 01/15/2014  . Preventative health care 01/15/2014  . Abnormal serum level of alkaline phosphatase 05/21/2014  . Diabetes mellitus type 2 in obese 10/30/2011  . Palpitations 01/28/2015   No past surgical history on file.  Current Outpatient Prescriptions  Medication Sig Dispense Refill  . ALPRAZolam (XANAX) 0.5 MG tablet Take 1 tablet (0.5 mg total) by mouth 3 (three) times daily as needed for anxiety. 90 tablet 1  . amitriptyline (ELAVIL) 10 MG tablet Take 1-2 tablets (10-20 mg total) by mouth at bedtime as needed for sleep. 60 tablet 2  . BAYER MICROLET LANCETS lancets Use as directed to test daily and as needed for symptoms 100 each 1  . Blood Glucose Monitoring Suppl (CONTOUR BLOOD GLUCOSE SYSTEM) DEVI Use as directed to test daily and as needed for symptoms 1 Device 0  . cyclobenzaprine (FLEXERIL) 10 MG tablet Take 1 tablet (10 mg total) by mouth 2 (two) times daily as needed for muscle spasms. 60 tablet 1  . fluticasone (FLONASE) 50 MCG/ACT nasal spray INHALE 2 SPRAYS IN EACH NOSTRIL ONCE DAILY 16 g 4  . glucose blood (BAYER CONTOUR TEST) test strip Use as directed to test daily and as needed for symptoms 100 each 5  . HYDROcodone-acetaminophen (NORCO/VICODIN) 5-325 MG per tablet Take 1 tablet by mouth every 6 (six) hours as needed. 100 tablet 0  . ibuprofen (ADVIL,MOTRIN) 800 MG tablet Take 1 tablet (  800 mg total) by mouth 3 (three) times daily. 21 tablet 0  . meloxicam (MOBIC) 15 MG tablet TAKE 1 TABLET BY MOUTH DAILY 90 tablet 2  . metFORMIN (GLUCOPHAGE) 500 MG tablet Take 1 tablet (500 mg total) by mouth 3 (three) times daily. 90 tablet 0  . metoprolol succinate (TOPROL XL) 50 MG 24 hr tablet Take 1 tablet (50 mg total) by mouth daily. Take with or immediately following a meal. 30 tablet 2  . montelukast (SINGULAIR) 10 MG tablet TAKE 1 TABLET BY MOUTH EVERY NIGHT AT BEDTIME 30 tablet 6  . omeprazole (PRILOSEC) 20 MG capsule TAKE ONE CAPSULE BY MOUTH TWICE DAILY AS NEEDED  FOR REFLUX 60 capsule 6  . promethazine (PHENERGAN) 25 MG tablet Take 1 tablet (25 mg total) by mouth every 8 (eight) hours as needed for nausea or vomiting. 20 tablet 2  . ranitidine (ZANTAC) 300 MG tablet TAKE 1 TABLET BY MOUTH AT BEDTIME 30 tablet 6  . Saline 0.65 % (SOLN) SOLN Place 2 Squirts into the nose 2 (two) times daily. 1 Bottle 0  . SUMAtriptan (IMITREX) 50 MG tablet TAKE 1 TABLET BY MOUTH EVERY 2 HOURS AS NEEDED FOR MIGRAINE - NO MORE THAN 2 PER 24 HOURS 10 tablet 2  . venlafaxine XR (EFFEXOR XR) 37.5 MG 24 hr capsule Take 1-2 capsules (37.5-75 mg total) by mouth daily with breakfast. 60 capsule 1  . fexofenadine (ALLEGRA) 180 MG tablet Take 1 tablet (180 mg total) by mouth daily as needed. 30 tablet 5   No current facility-administered medications for this visit.   Allergies:   Review of patient's allergies indicates no known allergies.    Social History:  The patient  reports that she has been smoking Cigars.  She has never used smokeless tobacco. She reports that she drinks alcohol. She reports that she does not use illicit drugs.   Family History:  The patient's family history includes Allergies in her brother; Arthritis in her maternal grandmother and other; Asthma in her sister; Cancer in her maternal grandfather and paternal grandmother; Cataracts in her maternal grandmother; Coronary artery disease in her maternal grandfather; Diabetes in her maternal grandfather and paternal grandfather; Glaucoma in her maternal grandmother; Lupus in her mother; Scleroderma in her maternal grandmother; Thyroid disease in her paternal grandmother and sister.    ROS:  Please see the history of present illness.   Otherwise, review of systems are positive for none.   All other systems are reviewed and negative.    PHYSICAL EXAM: VS:  BP 124/76 mmHg  Pulse 106  Ht  (1.6 m)  Wt 208 lb (94.348 kg)  BMI 36.85 kg/m2  SpO2 99%  LMP 12/10/2012 , BMI Body mass index is 36.85 kg/(m^2). GEN:  Well nourished, well developed, in no acute distress HEENT: normal Neck: no JVD, carotid bruits, or masses Cardiac: RRR; no murmurs, rubs, or gallops,no edema  Respiratory:  clear to auscultation bilaterally, normal work of breathing GI: soft, nontender, nondistended, + BS MS: no deformity or atrophy Skin: warm and dry, no rash Neuro:  Strength and sensation are intact Psych: euthymic mood, full affect  EKG:  EKG is not ordered today. The ekg ordered today demonstrates Sinus tachycardia  Recent Labs: 02/15/2015: ALT 20; BUN 10; Creatinine, Ser 0.74; Hemoglobin 12.8; Platelets 215.0; Potassium 3.6; Sodium 138; TSH 1.00   Lipid Panel    Component Value Date/Time   CHOL 169 02/15/2015 1603   TRIG 143.0 02/15/2015 1603   HDL 51.00  02/15/2015 1603   CHOLHDL 3 02/15/2015 1603   VLDL 28.6 02/15/2015 1603   LDLCALC 89 02/15/2015 1603   Wt Readings from Last 3 Encounters:  03/30/15 208 lb (94.348 kg)  03/08/15 206 lb 8 oz (93.668 kg)  01/19/15 207 lb (93.895 kg)      ASSESSMENT AND PLAN:  1.  DOE - risk factors include DM, HLP, HTN, early menopause - schedule ETT  2. Chest pain - atypical - most probably related to gallstones  3. HTN - controlled  4. Palpitations - seems like inappropriate sinus tachycardia - advised to increase fluid intake and drink gatorade on hot days, continue toprol, TSH normal, check echocardiogram.  5. HLP - start atorvastatin 10 mg po daily, check CMP and lipids in 1 month.  Follow up in 2 months.  Signed, Lars Masson, MD  03/30/2015 2:17 PM    [facility] [address] [place], [place], [place]  [address] Phone: [phone]; Fax: [phone]

## 2015-03-30 NOTE — Patient Instructions (Signed)
Medication Instructions:   START ATORVASTATIN 10 MG ONCE DAILY    Labwork:  ONE MONTH TO CHECK A CMET AND LIPIDS---PLEASE COME FASTING TO THIS APPOINTMENT    Testing/Procedures:  Your physician has requested that you have an exercise tolerance test. For further information please visit [email or web address] Please also follow instruction sheet, as given.  PLEASE HAVE THIS SCHEDULED AS SOON AS POSSIBLE, FOR YOUR SURGICAL CLEARANCE.    Your physician has requested that you have an echocardiogram. Echocardiography is a painless test that uses sound waves to create images of your heart. It provides your doctor with information about the size and shape of your heart and how well your heart's chambers and valves are working. This procedure takes approximately one hour. There are no restrictions for this procedure.    Follow-Up:  2 MONTHS WITH DR Delton See

## 2015-04-06 ENCOUNTER — Ambulatory Visit (HOSPITAL_COMMUNITY): Payer: Self-pay | Attending: Cardiovascular Disease

## 2015-04-06 ENCOUNTER — Other Ambulatory Visit: Payer: Self-pay

## 2015-04-06 DIAGNOSIS — Z6836 Body mass index (BMI) 36.0-36.9, adult: Secondary | ICD-10-CM | POA: Insufficient documentation

## 2015-04-06 DIAGNOSIS — I517 Cardiomegaly: Secondary | ICD-10-CM | POA: Insufficient documentation

## 2015-04-06 DIAGNOSIS — E119 Type 2 diabetes mellitus without complications: Secondary | ICD-10-CM | POA: Insufficient documentation

## 2015-04-06 DIAGNOSIS — E669 Obesity, unspecified: Secondary | ICD-10-CM | POA: Insufficient documentation

## 2015-04-06 DIAGNOSIS — E785 Hyperlipidemia, unspecified: Secondary | ICD-10-CM | POA: Insufficient documentation

## 2015-04-06 DIAGNOSIS — I10 Essential (primary) hypertension: Secondary | ICD-10-CM

## 2015-04-06 DIAGNOSIS — R002 Palpitations: Secondary | ICD-10-CM | POA: Insufficient documentation

## 2015-04-06 DIAGNOSIS — R079 Chest pain, unspecified: Secondary | ICD-10-CM

## 2015-04-30 ENCOUNTER — Other Ambulatory Visit (INDEPENDENT_AMBULATORY_CARE_PROVIDER_SITE_OTHER): Payer: Self-pay | Admitting: *Deleted

## 2015-04-30 ENCOUNTER — Other Ambulatory Visit: Payer: Self-pay | Admitting: Family Medicine

## 2015-04-30 DIAGNOSIS — E785 Hyperlipidemia, unspecified: Secondary | ICD-10-CM

## 2015-04-30 DIAGNOSIS — R1031 Right lower quadrant pain: Secondary | ICD-10-CM

## 2015-04-30 DIAGNOSIS — E782 Mixed hyperlipidemia: Secondary | ICD-10-CM

## 2015-04-30 DIAGNOSIS — E1169 Type 2 diabetes mellitus with other specified complication: Secondary | ICD-10-CM

## 2015-04-30 DIAGNOSIS — M25562 Pain in left knee: Secondary | ICD-10-CM

## 2015-04-30 DIAGNOSIS — I10 Essential (primary) hypertension: Secondary | ICD-10-CM

## 2015-04-30 DIAGNOSIS — F419 Anxiety disorder, unspecified: Secondary | ICD-10-CM

## 2015-04-30 DIAGNOSIS — R002 Palpitations: Secondary | ICD-10-CM

## 2015-04-30 DIAGNOSIS — G4700 Insomnia, unspecified: Secondary | ICD-10-CM

## 2015-04-30 DIAGNOSIS — R079 Chest pain, unspecified: Secondary | ICD-10-CM

## 2015-04-30 DIAGNOSIS — E669 Obesity, unspecified: Secondary | ICD-10-CM

## 2015-04-30 LAB — LIPID PANEL
Cholesterol: 130 mg/dL (ref 0–200)
HDL: 59 mg/dL (ref >39.00)
LDL Cholesterol: 53 mg/dL (ref 0–99)
NonHDL: 71.2
Total CHOL/HDL Ratio: 2
Triglycerides: 91 mg/dL (ref 0.0–149.0)
VLDL: 18.2 mg/dL (ref 0.0–40.0)

## 2015-04-30 LAB — COMPREHENSIVE METABOLIC PANEL
ALT: 22 U/L (ref 0–35)
AST: 16 U/L (ref 0–37)
Albumin: 3.9 g/dL (ref 3.5–5.2)
Alkaline Phosphatase: 123 U/L — ABNORMAL HIGH (ref 39–117)
BUN: 13 mg/dL (ref 6–23)
CO2: 26 mEq/L (ref 19–32)
Calcium: 9.7 mg/dL (ref 8.4–10.5)
Chloride: 103 mEq/L (ref 96–112)
Creatinine, Ser: 0.71 mg/dL (ref 0.40–1.20)
GFR: 116.72 mL/min (ref >60.00)
Glucose, Bld: 163 mg/dL — ABNORMAL HIGH (ref 70–99)
Potassium: 3.6 mEq/L (ref 3.5–5.1)
Sodium: 137 mEq/L (ref 135–145)
Total Bilirubin: 0.2 mg/dL (ref 0.2–1.2)
Total Protein: 7.2 g/dL (ref 6.0–8.3)

## 2015-04-30 MED ORDER — HYDROCODONE-ACETAMINOPHEN 5-325 MG PO TABS: 1.0000 | ORAL_TABLET | Freq: Four times a day (QID) | ORAL | Status: DC | PRN

## 2015-04-30 MED ORDER — SUMATRIPTAN SUCCINATE 50 MG PO TABS: ORAL_TABLET | ORAL | Status: DC

## 2015-04-30 NOTE — Telephone Encounter (Signed)
1. Pt needing refill on SUMAtriptan . She's been having a lot of stress and headaches. Send to [facility] pharmacy. 2. Pt needing refill on hydrocodone. Pt taking 3-4/day. Has 3 days left. Please call when RX ready to pick up. 3. Pt states she has been tiered down to lower dose of Effexor and was going to transition to Prozac. She is wanting to know when that prescription will be changed.   Best # [phone].

## 2015-04-30 NOTE — Telephone Encounter (Signed)
Called the patient informed hardcopy is ready for pickup at the front desk. 

## 2015-04-30 NOTE — Telephone Encounter (Signed)
Requesting:  Hydrocodone Contract   3.8.16 UDS  Low Risk Last OV     7.28.16 Last Refill   #100 with 0 refills 03/16/15  Please Advise  On ALL 3 REQUEST,

## 2015-04-30 NOTE — Addendum Note (Signed)
Addended by: Tonita Phoenix on: 04/30/2015 02:16 PM   Modules accepted: Orders

## 2015-05-01 ENCOUNTER — Other Ambulatory Visit: Payer: Self-pay | Admitting: Family Medicine

## 2015-05-01 ENCOUNTER — Telehealth: Payer: Self-pay | Admitting: Cardiology

## 2015-05-01 MED ORDER — AMITRIPTYLINE HCL 10 MG PO TABS: 10.0000 mg | ORAL_TABLET | Freq: Every evening | ORAL | Status: DC | PRN

## 2015-05-01 NOTE — Telephone Encounter (Signed)
New message ° ° °Pt calling for lab results °

## 2015-05-01 NOTE — Telephone Encounter (Signed)
Notified the pt that per Dr Delton See her labs showed that she is having a great response to atorvastatin, now all lipids at goal and normal LFTs.  Pt verbalized understanding and pleased with this news.

## 2015-05-01 NOTE — Telephone Encounter (Signed)
Requesting: Amitryptyline Contract  10/17/14 UDS     Low Risk Last OV   03/08/15 Last Refill   02/08/15  #60 with 2 refills   Please Advise---[facility] refill request have never refilled this before and request new script.

## 2015-05-03 ENCOUNTER — Encounter: Payer: Self-pay | Admitting: Physician Assistant

## 2015-05-04 ENCOUNTER — Telehealth: Payer: Self-pay | Admitting: Cardiology

## 2015-05-04 NOTE — Telephone Encounter (Signed)
New problem   Pt need to speak to nurse concerning why she didn't make it to her GXT test on 9.22.16. She doesn't want to be charge the No Show Fee. Please call pt.

## 2015-05-04 NOTE — Telephone Encounter (Signed)
Patient actually wanted to speak with billing department Will forward to [facility] in billing

## 2015-05-07 ENCOUNTER — Other Ambulatory Visit: Payer: Self-pay | Admitting: Family Medicine

## 2015-05-07 MED ORDER — DESVENLAFAXINE SUCCINATE ER 50 MG PO TB24
50.0000 mg | ORAL_TABLET | Freq: Every day | ORAL | Status: DC
Start: 2015-05-07 — End: 2015-07-03

## 2015-05-11 ENCOUNTER — Encounter: Payer: Self-pay | Admitting: Physician Assistant

## 2015-05-21 ENCOUNTER — Encounter: Payer: Self-pay | Admitting: Cardiology

## 2015-05-21 ENCOUNTER — Ambulatory Visit (INDEPENDENT_AMBULATORY_CARE_PROVIDER_SITE_OTHER): Payer: Self-pay | Admitting: Cardiology

## 2015-05-21 VITALS — BP 130/74 | HR 93 | Ht 63.0 in | Wt 208.0 lb

## 2015-05-21 DIAGNOSIS — I10 Essential (primary) hypertension: Secondary | ICD-10-CM

## 2015-05-21 DIAGNOSIS — R072 Precordial pain: Secondary | ICD-10-CM

## 2015-05-21 DIAGNOSIS — R002 Palpitations: Secondary | ICD-10-CM | POA: Insufficient documentation

## 2015-05-21 DIAGNOSIS — E785 Hyperlipidemia, unspecified: Secondary | ICD-10-CM

## 2015-05-21 NOTE — Patient Instructions (Signed)
Medication Instructions:  Your physician recommends that you continue on your current medications as directed. Please refer to the Current Medication list given to you today.   Labwork: None orderd  Testing/Procedures: Your physician has requested that you have an exercise tolerance test. For further information please visit [email or web address] Please also follow instruction sheet, as given.    Follow-Up: Your physician wants you to follow-up in: 1 year with Dr.Nelson You will receive a reminder letter in the mail two months in advance. If you don't receive a letter, please call our office to schedule the follow-up appointment.   Any Other Special Instructions Will Be Listed Below (If Applicable).

## 2015-05-21 NOTE — Progress Notes (Signed)
Patient ID: Nicole Ball, female   DOB: 14-Nov-1975, 39 y.o.   MRN: [id number]      Cardiology Office Note  Date:  05/21/2015   ID:  Nicole Ball, DOB 07-10-1976, MRN [id number]  PCP:  Danise Edge, MD  Cardiologist:  Lars Masson, MD   Chief complain: Palpitations chest pain and DOE   History of Present Illness: Nicole Ball is a 39 y.o. female with h/o DM 2 (for the last 2-3 years with PCOS and early menopause), HTN, anxiety who has noticed palpitations and tachycardia in March 2016, she was started on metoprolol and her symptoms improved however she continues to be tachycardic. They are very uncomfortable, but no dizziness or syncope.  She was recently diagnosed with fatty liver and gallbladder stone and she is supposed to see a surgeon the next week.  She is undergoing significant stress with her husband who is also our patient requires myectomy for HCM. She has noticed worsening DOE. She also has non-exertional chest pain mostly after food that is most probably related to gallstones. No syncope.  H/O MI in grandparents.   Past Medical History  Diagnosis Date  . Allergy     seasonal  . Acute low back pain 12/19/2010  . Overweight(278.02) 10/16/2010  . SNORING 10/16/2010  . UTI'S, HX OF 10/16/2010  . INSOMNIA 10/16/2010  . HIATAL HERNIA WITH REFLUX, HX OF 10/16/2010  . ALLERGIC RHINITIS 10/16/2010  . Hyperlipidemia 04/21/2011  . Elevated BP 04/21/2011  . Sleep apnea 06/07/2011  . Anxiety and depression 06/07/2011  . Amenorrhea 07/11/2011  . Diabetes mellitus 10/30/2011  . Knee pain, left 03/11/2012  . Reflux 04/22/2012  . Acute sinusitis 08/06/2012  . Migraine 03/07/2013  . Cervical cancer screening 01/10/2014    Menarche at 8 Regular and moderate flow, became irregular until OCPs  history of abnormal pap in past, repeat was normal Last pap roughly 3 years ago G0P0, s/p No history of abnormal MGM, no h/o MGM  No concerns today no gyn surgeries LMP May 2013  . CTS (carpal  tunnel syndrome) 01/15/2014  . Preventative health care 01/15/2014  . Abnormal serum level of alkaline phosphatase 05/21/2014  . Diabetes mellitus type 2 in obese (HCC) 10/30/2011  . Palpitations 01/28/2015   No past surgical history on file.  Current Outpatient Prescriptions  Medication Sig Dispense Refill  . ALPRAZolam (XANAX) 0.5 MG tablet Take 1 tablet (0.5 mg total) by mouth 3 (three) times daily as needed for anxiety. 90 tablet 1  . amitriptyline (ELAVIL) 10 MG tablet Take 1-2 tablets (10-20 mg total) by mouth at bedtime as needed for sleep. 60 tablet 2  . atorvastatin (LIPITOR) 10 MG tablet Take 1 tablet (10 mg total) by mouth daily. 90 tablet 3  . BAYER MICROLET LANCETS lancets Use as directed to test daily and as needed for symptoms 100 each 1  . Blood Glucose Monitoring Suppl (CONTOUR BLOOD GLUCOSE SYSTEM) DEVI Use as directed to test daily and as needed for symptoms 1 Device 0  . cyclobenzaprine (FLEXERIL) 10 MG tablet Take 1 tablet (10 mg total) by mouth 2 (two) times daily as needed for muscle spasms. 60 tablet 1  . desvenlafaxine (PRISTIQ) 50 MG 24 hr tablet Take 1 tablet (50 mg total) by mouth daily. 30 tablet 2  . fexofenadine (ALLEGRA) 180 MG tablet Take 1 tablet (180 mg total) by mouth daily as needed. 30 tablet 5  . fluticasone (FLONASE) 50 MCG/ACT nasal spray INHALE 2 SPRAYS IN  EACH NOSTRIL ONCE DAILY 16 g 4  . glucose blood (BAYER CONTOUR TEST) test strip Use as directed to test daily and as needed for symptoms 100 each 5  . HYDROcodone-acetaminophen (NORCO/VICODIN) 5-325 MG per tablet Take 1 tablet by mouth every 6 (six) hours as needed. 100 tablet 0  . ibuprofen (ADVIL,MOTRIN) 800 MG tablet Take 1 tablet (800 mg total) by mouth 3 (three) times daily. 21 tablet 0  . meloxicam (MOBIC) 15 MG tablet TAKE 1 TABLET BY MOUTH DAILY 90 tablet 2  . metFORMIN (GLUCOPHAGE) 500 MG tablet Take 1 tablet (500 mg total) by mouth 3 (three) times daily. 90 tablet 0  . metoprolol succinate  (TOPROL XL) 50 MG 24 hr tablet Take 1 tablet (50 mg total) by mouth daily. Take with or immediately following a meal. 30 tablet 2  . montelukast (SINGULAIR) 10 MG tablet TAKE 1 TABLET BY MOUTH EVERY NIGHT AT BEDTIME 30 tablet 6  . omeprazole (PRILOSEC) 20 MG capsule TAKE ONE CAPSULE BY MOUTH TWICE DAILY AS NEEDED FOR REFLUX 60 capsule 6  . promethazine (PHENERGAN) 25 MG tablet Take 1 tablet (25 mg total) by mouth every 8 (eight) hours as needed for nausea or vomiting. 20 tablet 2  . ranitidine (ZANTAC) 300 MG tablet TAKE 1 TABLET BY MOUTH AT BEDTIME 30 tablet 6  . Saline 0.65 % (SOLN) SOLN Place 2 Squirts into the nose 2 (two) times daily. 1 Bottle 0  . SUMAtriptan (IMITREX) 50 MG tablet TAKE 1 TABLET BY MOUTH EVERY 2 HOURS AS NEEDED FOR MIGRAINE - NO MORE THAN 2 PER 24 HOURS 10 tablet 2   No current facility-administered medications for this visit.   Allergies:   Review of patient's allergies indicates no known allergies.    Social History:  The patient  reports that she has been smoking Cigars.  She has never used smokeless tobacco. She reports that she drinks alcohol. She reports that she does not use illicit drugs.   Family History:  The patient's family history includes Allergies in her brother; Arthritis in her maternal grandmother and other; Asthma in her sister; Cancer in her maternal grandfather and paternal grandmother; Cataracts in her maternal grandmother; Coronary artery disease in her maternal grandfather; Diabetes in her maternal grandfather and paternal grandfather; Glaucoma in her maternal grandmother; Lupus in her mother; Scleroderma in her maternal grandmother; Thyroid disease in her paternal grandmother and sister.    ROS:  Please see the history of present illness.   Otherwise, review of systems are positive for none.   All other systems are reviewed and negative.    PHYSICAL EXAM: VS:  BP 130/74 mmHg  Pulse 93  Ht  (1.6 m)  Wt 208 lb (94.348 kg)  BMI 36.85 kg/m2   SpO2 98%  LMP 12/10/2012 , BMI Body mass index is 36.85 kg/(m^2). GEN: Well nourished, well developed, in no acute distress HEENT: normal Neck: no JVD, carotid bruits, or masses Cardiac: RRR; no murmurs, rubs, or gallops,no edema  Respiratory:  clear to auscultation bilaterally, normal work of breathing GI: soft, nontender, nondistended, + BS MS: no deformity or atrophy Skin: warm and dry, no rash Neuro:  Strength and sensation are intact Psych: euthymic mood, full affect  EKG:  EKG is not ordered today. The ekg ordered today demonstrates Sinus tachycardia  Recent Labs: 02/15/2015: Hemoglobin 12.8; Platelets 215.0; TSH 1.00 04/30/2015: ALT 22; BUN 13; Creatinine, Ser 0.71; Potassium 3.6; Sodium 137   Lipid Panel    Component Value  Date/Time   CHOL 130 04/30/2015 1417   TRIG 91.0 04/30/2015 1417   HDL 59.00 04/30/2015 1417   CHOLHDL 2 04/30/2015 1417   VLDL 18.2 04/30/2015 1417   LDLCALC 53 04/30/2015 1417   Wt Readings from Last 3 Encounters:  05/21/15 208 lb (94.348 kg)  03/30/15 208 lb (94.348 kg)  03/08/15 206 lb 8 oz (93.668 kg)    TTE:  Left ventricle: The cavity size was normal. Systolic function was normal. The estimated ejection fraction was in the range of 55% to 60%. Wall motion was normal; there were no regional wall motion abnormalities. - Left atrium: The atrium was mildly dilated. - Atrial septum: No defect or patent foramen ovale was identified     ASSESSMENT AND PLAN:  1.  DOE - risk factors include DM, HLP, HTN, early menopause - schedule ETT  2. Chest pain - atypical - most probably related to gallstones  3. HTN - controlled  4. Palpitations - seems like inappropriate sinus tachycardia - advised to increase fluid intake and drink gatorade on hot days, continue toprol, TSH normal, check echocardiogram. Improved tachycardia with decreasing the dose of Flexeryl.  5. HLP - start atorvastatin 10 mg po daily, excellent response with  atorvastatin, all lipids at goal now, we will continue the same regimen.   If normal stress test, follow up in 1 year.Rico Sheehan, MD  05/21/2015 9:19 AM    [facility] [address] [place], [place], [place]  [address] Phone: [phone]; Fax: [phone]

## 2015-05-30 ENCOUNTER — Ambulatory Visit: Payer: Self-pay | Admitting: Cardiology

## 2015-05-31 ENCOUNTER — Telehealth: Payer: Self-pay | Admitting: Family Medicine

## 2015-05-31 DIAGNOSIS — E1169 Type 2 diabetes mellitus with other specified complication: Secondary | ICD-10-CM

## 2015-05-31 DIAGNOSIS — F419 Anxiety disorder, unspecified: Secondary | ICD-10-CM

## 2015-05-31 DIAGNOSIS — E669 Obesity, unspecified: Secondary | ICD-10-CM

## 2015-05-31 DIAGNOSIS — R002 Palpitations: Secondary | ICD-10-CM

## 2015-05-31 DIAGNOSIS — G4700 Insomnia, unspecified: Secondary | ICD-10-CM

## 2015-05-31 DIAGNOSIS — M25562 Pain in left knee: Secondary | ICD-10-CM

## 2015-05-31 DIAGNOSIS — R1031 Right lower quadrant pain: Secondary | ICD-10-CM

## 2015-05-31 DIAGNOSIS — E782 Mixed hyperlipidemia: Secondary | ICD-10-CM

## 2015-05-31 NOTE — Telephone Encounter (Signed)
Requesting:  Alprazolam and Hydrocodone Contract  Signed on 10/27/14 UDS   DUE NOW Last OV  03/08/15 Last Refill  Alprazolam   #90 with 1 refill on 01/19/15                    Hydrocodone  #100 with 0 refills on 04/30/15  ALSO, Patient states Pristiq too expensive, needs generic    Please Advise

## 2015-05-31 NOTE — Telephone Encounter (Signed)
Relation to VH:QIONpt:self Call back number:  [phone]  Pharmacy: [facility] [place]   Reason for call:   Patient requesting ALPRAZolam (XANAX) 0.5 MG tablet and HYDROcodone-acetaminophen (NORCO/VICODIN) 5-325 MG per tablet and patient states desvenlafaxine (PRISTIQ) 50 MG 24 hr tablet is to expensive requesting generic brand. Please advise

## 2015-05-31 NOTE — Telephone Encounter (Signed)
I already offered a prescription for Prozac to replace Pristiq due to cost. Although I have realized I think we have copay cards or she could download one if she wants to stay on Pristiq. Also OK to refill Hydrocodone and Alprazolam

## 2015-05-31 NOTE — Telephone Encounter (Signed)
Have her start Fluoxetine 20 mg tab, 1 tab po daily, disp #30 with 3 rf

## 2015-05-31 NOTE — Telephone Encounter (Signed)
Pt said the desvenlafaxine is too expensive. She's tried zoloft, citalopram, effexor and she thinks another. She is wanting to try something else and said that she discussed Prozac with Dr. Abner GreenspanBlyth. She is ok with whatever Dr. Abner GreenspanBlyth feels would be good for her. She has gone without a week without anything.

## 2015-06-01 ENCOUNTER — Telehealth: Payer: Self-pay | Admitting: Family Medicine

## 2015-06-01 MED ORDER — HYDROCODONE-ACETAMINOPHEN 5-325 MG PO TABS: 1.0000 | ORAL_TABLET | Freq: Four times a day (QID) | ORAL | Status: DC | PRN

## 2015-06-01 MED ORDER — ALPRAZOLAM 0.5 MG PO TABS: 0.5000 mg | ORAL_TABLET | Freq: Three times a day (TID) | ORAL | Status: DC | PRN

## 2015-06-01 MED ORDER — FLUOXETINE HCL 20 MG PO TABS: 20.0000 mg | ORAL_TABLET | Freq: Every day | ORAL | Status: DC

## 2015-06-01 NOTE — Telephone Encounter (Signed)
Caller name: Sharyl NimrodMeredith   Relationship to patient: Pharmacy   Can be reached: [phone]  Pharmacy: [facility] [place] - [address]  Reason for call: She says that the script is for tablets (which are very expensive) she want to know if it is okay to switch the script to capsules  Instead?

## 2015-06-01 NOTE — Telephone Encounter (Signed)
Called pharmacy informed ok to switch to capsules

## 2015-06-01 NOTE — Telephone Encounter (Signed)
Called the patient informed PCP approved refill of Alprazolam and Hydrocodone, but was not in the office today to sign.  But that on Monday once PCP is back in the office will sign and will be put at the front desk for pickup on Monday 06/04/15.  Prozac sent in already by other office staff.

## 2015-06-01 NOTE — Telephone Encounter (Signed)
Printed both Alprazolam and Hydrocodone and on counter for signature by PCP once back in the office.

## 2015-06-01 NOTE — Telephone Encounter (Signed)
Ok to switch 

## 2015-06-01 NOTE — Telephone Encounter (Addendum)
Pt updated on new Rx.  Rx printed and placed in provider's red folder for review and signature.  Will notify pt once rx is signed.  Pt would like to pick up all scripts at the same time.

## 2015-06-01 NOTE — Telephone Encounter (Signed)
Advise please the prescription in reference here is Prozac 20 mg tablets.

## 2015-06-01 NOTE — Telephone Encounter (Signed)
Left a message for call back.  

## 2015-06-04 NOTE — Telephone Encounter (Signed)
Vladimir Croftsobin B Ewing, CMA at 06/01/2015 11:39 AM     Status: Signed       Expand All Collapse All   Called the patient informed PCP approved refill of Alprazolam and Hydrocodone, but was not in the office today to sign. But that on Monday once PCP is back in the office will sign and will be put at the front desk for pickup on Monday 06/04/15.  Prozac sent in already by other office staff.            Vladimir Croftsobin B Ewing, CMA at 06/01/2015 7:04 AM     Status: Signed       Expand All Collapse All   Printed both Alprazolam and Hydrocodone and on counter for signature by PCP once back in the office.

## 2015-06-06 ENCOUNTER — Telehealth: Payer: Self-pay | Admitting: Family Medicine

## 2015-06-06 NOTE — Telephone Encounter (Signed)
Spoke with Nicole Ball at pharmacy and he will cancel future refills. Pt was given #90 and they will not give additional refills.

## 2015-06-06 NOTE — Telephone Encounter (Signed)
Caller name: Romeo AppleBen @ [facility] Can be reached: [phone]  Reason for call: Romeo AppleBen states there was a note on the RX for Alprazolam. He filled the med but not sure if the notation was "BOXO" or "30x0" meaning possibly 30ud with 0 refills. He is faxing up a copy of the original that I will place on Robin's desk. He would like you to notify him once you are available.

## 2015-06-12 ENCOUNTER — Encounter: Payer: Self-pay | Admitting: Physician Assistant

## 2015-06-21 ENCOUNTER — Encounter: Payer: Self-pay | Admitting: Physician Assistant

## 2015-06-26 ENCOUNTER — Ambulatory Visit: Payer: Self-pay | Admitting: Family Medicine

## 2015-07-03 ENCOUNTER — Ambulatory Visit (INDEPENDENT_AMBULATORY_CARE_PROVIDER_SITE_OTHER): Payer: Self-pay | Admitting: Family Medicine

## 2015-07-03 ENCOUNTER — Encounter: Payer: Self-pay | Admitting: Family Medicine

## 2015-07-03 VITALS — BP 118/64 | HR 103 | Temp 99.1°F | Ht 63.0 in | Wt 206.4 lb

## 2015-07-03 DIAGNOSIS — F419 Anxiety disorder, unspecified: Secondary | ICD-10-CM

## 2015-07-03 DIAGNOSIS — M25562 Pain in left knee: Secondary | ICD-10-CM

## 2015-07-03 DIAGNOSIS — E119 Type 2 diabetes mellitus without complications: Secondary | ICD-10-CM

## 2015-07-03 DIAGNOSIS — E782 Mixed hyperlipidemia: Secondary | ICD-10-CM

## 2015-07-03 DIAGNOSIS — R002 Palpitations: Secondary | ICD-10-CM

## 2015-07-03 DIAGNOSIS — G4700 Insomnia, unspecified: Secondary | ICD-10-CM

## 2015-07-03 DIAGNOSIS — E663 Overweight: Secondary | ICD-10-CM

## 2015-07-03 DIAGNOSIS — E1169 Type 2 diabetes mellitus with other specified complication: Secondary | ICD-10-CM

## 2015-07-03 DIAGNOSIS — K449 Diaphragmatic hernia without obstruction or gangrene: Secondary | ICD-10-CM

## 2015-07-03 DIAGNOSIS — R1031 Right lower quadrant pain: Secondary | ICD-10-CM

## 2015-07-03 DIAGNOSIS — B379 Candidiasis, unspecified: Secondary | ICD-10-CM

## 2015-07-03 DIAGNOSIS — I10 Essential (primary) hypertension: Secondary | ICD-10-CM

## 2015-07-03 DIAGNOSIS — E785 Hyperlipidemia, unspecified: Secondary | ICD-10-CM

## 2015-07-03 DIAGNOSIS — K219 Gastro-esophageal reflux disease without esophagitis: Secondary | ICD-10-CM

## 2015-07-03 DIAGNOSIS — E669 Obesity, unspecified: Secondary | ICD-10-CM

## 2015-07-03 DIAGNOSIS — Z23 Encounter for immunization: Secondary | ICD-10-CM

## 2015-07-03 MED ORDER — FLUCONAZOLE 150 MG PO TABS: 150.0000 mg | ORAL_TABLET | ORAL | Status: DC

## 2015-07-03 MED ORDER — FLUOXETINE HCL 40 MG PO CAPS: 40.0000 mg | ORAL_CAPSULE | Freq: Every day | ORAL | Status: DC

## 2015-07-03 MED ORDER — HYDROCODONE-ACETAMINOPHEN 5-325 MG PO TABS: 1.0000 | ORAL_TABLET | Freq: Four times a day (QID) | ORAL | Status: DC | PRN

## 2015-07-03 MED ORDER — ALPRAZOLAM 0.5 MG PO TABS: 0.5000 mg | ORAL_TABLET | Freq: Three times a day (TID) | ORAL | Status: DC | PRN

## 2015-07-03 NOTE — Progress Notes (Signed)
Pre visit review using our clinic review tool, if applicable. No additional management support is needed unless otherwise documented below in the visit note. 

## 2015-07-03 NOTE — Patient Instructions (Addendum)
Melatonin 2-10 mg or L Tryptophan for sleep   Basic Carbohydrate Counting for Diabetes Mellitus Carbohydrate counting is a method for keeping track of the amount of carbohydrates you eat. Eating carbohydrates naturally increases the level of sugar (glucose) in your blood, so it is important for you to know the amount that is okay for you to have in every meal. Carbohydrate counting helps keep the level of glucose in your blood within normal limits. The amount of carbohydrates allowed is different for every person. A dietitian can help you calculate the amount that is right for you. Once you know the amount of carbohydrates you can have, you can count the carbohydrates in the foods you want to eat. Carbohydrates are found in the following foods:  Grains, such as breads and cereals.  Dried beans and soy products.  Starchy vegetables, such as potatoes, peas, and corn.  Fruit and fruit juices.  Milk and yogurt.  Sweets and snack foods, such as cake, cookies, candy, chips, soft drinks, and fruit drinks. CARBOHYDRATE COUNTING There are two ways to count the carbohydrates in your food. You can use either of the methods or a combination of both. Reading the "Nutrition Facts" on Packaged Food The "Nutrition Facts" is an area that is included on the labels of almost all packaged food and beverages in the [place]. It includes the serving size of that food or beverage and information about the nutrients in each serving of the food, including the grams (g) of carbohydrate per serving.  Decide the number of servings of this food or beverage that you will be able to eat or drink. Multiply that number of servings by the number of grams of carbohydrate that is listed on the label for that serving. The total will be the amount of carbohydrates you will be having when you eat or drink this food or beverage. Learning Standard Serving Sizes of Food When you eat food that is not packaged or does not include  "Nutrition Facts" on the label, you need to measure the servings in order to count the amount of carbohydrates.A serving of most carbohydrate-rich foods contains about 15 g of carbohydrates. The following list includes serving sizes of carbohydrate-rich foods that provide 15 g ofcarbohydrate per serving:   1 slice of bread (1 oz) or 1 six-inch tortilla.    of a hamburger bun or English muffin.  4-6 crackers.   cup unsweetened dry cereal.    cup hot cereal.   cup rice or pasta.    cup mashed potatoes or  of a large baked potato.  1 cup fresh fruit or one small piece of fruit.    cup canned or frozen fruit or fruit juice.  1 cup milk.   cup plain fat-free yogurt or yogurt sweetened with artificial sweeteners.   cup cooked dried beans or starchy vegetable, such as peas, corn, or potatoes.  Decide the number of standard-size servings that you will eat. Multiply that number of servings by 15 (the grams of carbohydrates in that serving). For example, if you eat 2 cups of strawberries, you will have eaten 2 servings and 30 g of carbohydrates (2 servings x 15 g = 30 g). For foods such as soups and casseroles, in which more than one food is mixed in, you will need to count the carbohydrates in each food that is included. EXAMPLE OF CARBOHYDRATE COUNTING Sample Dinner  3 oz chicken breast.   cup of brown rice.   cup of corn.  1 cup milk.   1 cup strawberries with sugar-free whipped topping.  Carbohydrate Calculation Step 1: Identify the foods that contain carbohydrates:   Rice.   Corn.   Milk.   Strawberries. Step 2:Calculate the number of servings eaten of each:   2 servings of rice.   1 serving of corn.   1 serving of milk.   1 serving of strawberries. Step 3: Multiply each of those number of servings by 15 g:   2 servings of rice x 15 g = 30 g.   1 serving of corn x 15 g = 15 g.   1 serving of milk x 15 g = 15 g.   1 serving of  strawberries x 15 g = 15 g. Step 4: Add together all of the amounts to find the total grams of carbohydrates eaten: 30 g + 15 g + 15 g + 15 g = 75 g.   This information is not intended to replace advice given to you by your health care provider. Make sure you discuss any questions you have with your health care provider.   Document Released: 07/28/2005 Document Revised: 08/18/2014 Document Reviewed: 06/24/2013 Elsevier Interactive Patient Education [employer].

## 2015-07-08 NOTE — Assessment & Plan Note (Signed)
Encouraged DASH diet, decrease po intake and increase exercise as tolerated. Needs 7-8 hours of sleep nightly. Avoid trans fats, eat small, frequent meals every 4-5 hours with lean proteins, complex carbs and healthy fats. Minimize simple carbs 

## 2015-07-08 NOTE — Assessment & Plan Note (Signed)
Symptoms improving s/p cholecystectomy last week. Avoid offending foods, start probiotics. Do not eat large meals in late evening and consider raising head of bed.

## 2015-07-08 NOTE — Assessment & Plan Note (Signed)
hgba1c acceptable, minimize simple carbs. Increase exercise as tolerated.  

## 2015-07-08 NOTE — Progress Notes (Signed)
Patient ID: Nicole Ball, female   DOB: 12-03-1975, 39 y.o.   MRN: [id number]   Subjective:    Patient ID: Nicole Ball, female    DOB: 1975/11/09, 39 y.o.   MRN: [id number]  Chief Complaint  Patient presents with  . Follow-up    HPI Patient is in today for follow-up. She underwent Colace cystectomy last week for acute cholelithiasis. Is feeling much better. Her abdominal pain and nausea are greatly improved. Heartburn is improving as well. No fevers or chills. She is moving her bowels well. No urinary complaints. She continues to deal with a great deal of stress between her health and her husband's health but she's had no new concerns otherwise noted. Denies CP/palp/SOB/HA/congestion/fevers or GU c/o. Taking meds as prescribed  Past Medical History  Diagnosis Date  . Allergy     seasonal  . Acute low back pain 12/19/2010  . Overweight(278.02) 10/16/2010  . SNORING 10/16/2010  . UTI'S, HX OF 10/16/2010  . INSOMNIA 10/16/2010  . HIATAL HERNIA WITH REFLUX, HX OF 10/16/2010  . ALLERGIC RHINITIS 10/16/2010  . Hyperlipidemia 04/21/2011  . Elevated BP 04/21/2011  . Sleep apnea 06/07/2011  . Anxiety and depression 06/07/2011  . Amenorrhea 07/11/2011  . Diabetes mellitus 10/30/2011  . Knee pain, left 03/11/2012  . Reflux 04/22/2012  . Acute sinusitis 08/06/2012  . Migraine 03/07/2013  . Cervical cancer screening 01/10/2014    Menarche at 8 Regular and moderate flow, became irregular until OCPs  history of abnormal pap in past, repeat was normal Last pap roughly 3 years ago G0P0, s/p No history of abnormal MGM, no h/o MGM  No concerns today no gyn surgeries LMP May 2013  . CTS (carpal tunnel syndrome) 01/15/2014  . Preventative health care 01/15/2014  . Abnormal serum level of alkaline phosphatase 05/21/2014  . Diabetes mellitus type 2 in obese (HCC) 10/30/2011  . Palpitations 01/28/2015    No past surgical history on file.  Family History  Problem Relation Age of Onset  . Lupus Mother   .  Arthritis Maternal Grandmother   . Scleroderma Maternal Grandmother   . Glaucoma Maternal Grandmother   . Cataracts Maternal Grandmother   . Cancer Maternal Grandfather     prostate  . Coronary artery disease Maternal Grandfather     s/p angioplasty  . Diabetes Maternal Grandfather     Type 2  . Cancer Paternal Grandmother     Breast ca- dx in 58's  . Thyroid disease Paternal Grandmother   . Diabetes Paternal Grandfather   . Thyroid disease Sister   . Asthma Sister   . Allergies Brother   . Arthritis Other     Social History   Social History  . Marital Status: Married    Spouse Name: N/A  . Number of Children: N/A  . Years of Education: N/A   Occupational History  . Not on file.   Social History Main Topics  . Smoking status: Current Some Day Smoker -- 0.10 packs/day    Types: Cigars  . Smokeless tobacco: Never Used     Comment: 1 black and milds  . Alcohol Use: Yes     Comment: twice/year  . Drug Use: No  . Sexual Activity: Not on file   Other Topics Concern  . Not on file   Social History Narrative    Outpatient Prescriptions Prior to Visit  Medication Sig Dispense Refill  . amitriptyline (ELAVIL) 10 MG tablet Take 1-2 tablets (10-20 mg total) by mouth  at bedtime as needed for sleep. 60 tablet 2  . atorvastatin (LIPITOR) 10 MG tablet Take 1 tablet (10 mg total) by mouth daily. 90 tablet 3  . BAYER MICROLET LANCETS lancets Use as directed to test daily and as needed for symptoms 100 each 1  . Blood Glucose Monitoring Suppl (CONTOUR BLOOD GLUCOSE SYSTEM) DEVI Use as directed to test daily and as needed for symptoms 1 Device 0  . cyclobenzaprine (FLEXERIL) 10 MG tablet Take 1 tablet (10 mg total) by mouth 2 (two) times daily as needed for muscle spasms. 60 tablet 1  . fexofenadine (ALLEGRA) 180 MG tablet Take 1 tablet (180 mg total) by mouth daily as needed. 30 tablet 5  . fluticasone (FLONASE) 50 MCG/ACT nasal spray INHALE 2 SPRAYS IN EACH NOSTRIL ONCE DAILY 16  g 4  . glucose blood (BAYER CONTOUR TEST) test strip Use as directed to test daily and as needed for symptoms 100 each 5  . ibuprofen (ADVIL,MOTRIN) 800 MG tablet Take 1 tablet (800 mg total) by mouth 3 (three) times daily. 21 tablet 0  . meloxicam (MOBIC) 15 MG tablet TAKE 1 TABLET BY MOUTH DAILY 90 tablet 2  . metFORMIN (GLUCOPHAGE) 500 MG tablet Take 1 tablet (500 mg total) by mouth 3 (three) times daily. 90 tablet 0  . metoprolol succinate (TOPROL XL) 50 MG 24 hr tablet Take 1 tablet (50 mg total) by mouth daily. Take with or immediately following a meal. 30 tablet 2  . montelukast (SINGULAIR) 10 MG tablet TAKE 1 TABLET BY MOUTH EVERY NIGHT AT BEDTIME 30 tablet 6  . omeprazole (PRILOSEC) 20 MG capsule TAKE ONE CAPSULE BY MOUTH TWICE DAILY AS NEEDED FOR REFLUX 60 capsule 6  . promethazine (PHENERGAN) 25 MG tablet Take 1 tablet (25 mg total) by mouth every 8 (eight) hours as needed for nausea or vomiting. 20 tablet 2  . ranitidine (ZANTAC) 300 MG tablet TAKE 1 TABLET BY MOUTH AT BEDTIME 30 tablet 6  . Saline 0.65 % (SOLN) SOLN Place 2 Squirts into the nose 2 (two) times daily. 1 Bottle 0  . SUMAtriptan (IMITREX) 50 MG tablet TAKE 1 TABLET BY MOUTH EVERY 2 HOURS AS NEEDED FOR MIGRAINE - NO MORE THAN 2 PER 24 HOURS 10 tablet 2  . ALPRAZolam (XANAX) 0.5 MG tablet Take 1 tablet (0.5 mg total) by mouth 3 (three) times daily as needed for anxiety. 90 tablet 1  . desvenlafaxine (PRISTIQ) 50 MG 24 hr tablet Take 1 tablet (50 mg total) by mouth daily. 30 tablet 2  . FLUoxetine (PROZAC) 20 MG tablet Take 1 tablet (20 mg total) by mouth daily. 30 tablet 3  . HYDROcodone-acetaminophen (NORCO/VICODIN) 5-325 MG tablet Take 1 tablet by mouth every 6 (six) hours as needed. 100 tablet 0   No facility-administered medications prior to visit.    No Known Allergies  Review of Systems  Constitutional: Negative for fever and malaise/fatigue.  HENT: Negative for congestion.   Eyes: Negative for discharge.    Respiratory: Negative for shortness of breath.   Cardiovascular: Negative for chest pain, palpitations and leg swelling.  Gastrointestinal: Positive for heartburn, nausea and abdominal pain.  Genitourinary: Negative for dysuria.  Musculoskeletal: Negative for falls.  Skin: Negative for rash.  Neurological: Negative for loss of consciousness and headaches.  Endo/Heme/Allergies: Negative for environmental allergies.  Psychiatric/Behavioral: Positive for depression. The patient is nervous/anxious and has insomnia.        Objective:    Physical Exam  BP 118/64 mmHg  Pulse 103  Temp(Src) 99.1 F (37.3 C) (Oral)  Ht 5\' 3"  (1.6 m)  Wt 206 lb 6 oz (93.611 kg)  BMI 36.57 kg/m2  SpO2 97%  LMP 12/10/2012 Wt Readings from Last 3 Encounters:  07/03/15 206 lb 6 oz (93.611 kg)  05/21/15 208 lb (94.348 kg)  03/30/15 208 lb (94.348 kg)     Lab Results  Component Value Date   WBC 5.5 02/15/2015   HGB 12.8 02/15/2015   HCT 38.4 02/15/2015   PLT 215.0 02/15/2015   GLUCOSE 163* 04/30/2015   CHOL 130 04/30/2015   TRIG 91.0 04/30/2015   HDL 59.00 04/30/2015   LDLCALC 53 04/30/2015   ALT 22 04/30/2015   AST 16 04/30/2015   NA 137 04/30/2015   K 3.6 04/30/2015   CL 103 04/30/2015   CREATININE 0.71 04/30/2015   BUN 13 04/30/2015   CO2 26 04/30/2015   TSH 1.00 02/15/2015   HGBA1C 7.1* 02/15/2015    Lab Results  Component Value Date   TSH 1.00 02/15/2015   Lab Results  Component Value Date   WBC 5.5 02/15/2015   HGB 12.8 02/15/2015   HCT 38.4 02/15/2015   MCV 86.2 02/15/2015   PLT 215.0 02/15/2015   Lab Results  Component Value Date   NA 137 04/30/2015   K 3.6 04/30/2015   CO2 26 04/30/2015   GLUCOSE 163* 04/30/2015   BUN 13 04/30/2015   CREATININE 0.71 04/30/2015   BILITOT 0.2 04/30/2015   ALKPHOS 123* 04/30/2015   AST 16 04/30/2015   ALT 22 04/30/2015   PROT 7.2 04/30/2015   ALBUMIN 3.9 04/30/2015   CALCIUM 9.7 04/30/2015   GFR 116.72 04/30/2015   Lab  Results  Component Value Date   CHOL 130 04/30/2015   Lab Results  Component Value Date   HDL 59.00 04/30/2015   Lab Results  Component Value Date   LDLCALC 53 04/30/2015   Lab Results  Component Value Date   TRIG 91.0 04/30/2015   Lab Results  Component Value Date   CHOLHDL 2 04/30/2015   Lab Results  Component Value Date   HGBA1C 7.1* 02/15/2015       Assessment & Plan:   Problem List Items Addressed This Visit    Diabetes mellitus type 2 in obese (HCC)    hgba1c acceptable, minimize simple carbs. Increase exercise as tolerated      Relevant Medications   HYDROcodone-acetaminophen (NORCO/VICODIN) 5-325 MG tablet   Other Relevant Orders   CBC   TSH   Comprehensive metabolic panel   Lipid panel   Hemoglobin A1c   Microalbumin / creatinine urine ratio   Hiatal hernia with gastroesophageal reflux    Symptoms improving s/p cholecystectomy last week. Avoid offending foods, start probiotics. Do not eat large meals in late evening and consider raising head of bed.       HTN (hypertension)    Well controlled, no changes to meds. Encouraged heart healthy diet such as the DASH diet and exercise as tolerated.       Relevant Orders   CBC   TSH   Comprehensive metabolic panel   Lipid panel   Hemoglobin A1c   Microalbumin / creatinine urine ratio   Hyperlipidemia   Relevant Orders   CBC   TSH   Comprehensive metabolic panel   Lipid panel   Hemoglobin A1c   Microalbumin / creatinine urine ratio   INSOMNIA    Encouraged good sleep hygiene such as dark, quiet room. No  blue/green glowing lights such as computer screens in bedroom. No alcohol or stimulants in evening. Cut down on caffeine as able. Regular exercise is helpful but not just prior to bed time.       Knee pain, left   Relevant Medications   HYDROcodone-acetaminophen (NORCO/VICODIN) 5-325 MG tablet   Other Relevant Orders   CBC   TSH   Comprehensive metabolic panel   Lipid panel   Hemoglobin A1c     Microalbumin / creatinine urine ratio   Overweight    Encouraged DASH diet, decrease po intake and increase exercise as tolerated. Needs 7-8 hours of sleep nightly. Avoid trans fats, eat small, frequent meals every 4-5 hours with lean proteins, complex carbs and healthy fats. Minimize simple carbs      Palpitations - Primary   Relevant Medications   ALPRAZolam (XANAX) 0.5 MG tablet   Other Relevant Orders   CBC   TSH   Comprehensive metabolic panel   Lipid panel   Hemoglobin A1c   Microalbumin / creatinine urine ratio    Other Visit Diagnoses    Insomnia        Relevant Medications    HYDROcodone-acetaminophen (NORCO/VICODIN) 5-325 MG tablet    Other Relevant Orders    CBC    TSH    Comprehensive metabolic panel    Lipid panel    Hemoglobin A1c    Microalbumin / creatinine urine ratio    Anxiety        Relevant Medications    ALPRAZolam (XANAX) 0.5 MG tablet    HYDROcodone-acetaminophen (NORCO/VICODIN) 5-325 MG tablet    FLUoxetine (PROZAC) 40 MG capsule    Other Relevant Orders    CBC    TSH    Comprehensive metabolic panel    Lipid panel    Hemoglobin A1c    Microalbumin / creatinine urine ratio    Hyperlipidemia, mixed        Relevant Medications    HYDROcodone-acetaminophen (NORCO/VICODIN) 5-325 MG tablet    Other Relevant Orders    CBC    TSH    Comprehensive metabolic panel    Lipid panel    Hemoglobin A1c    Microalbumin / creatinine urine ratio    RLQ discomfort        Relevant Medications    HYDROcodone-acetaminophen (NORCO/VICODIN) 5-325 MG tablet    Other Relevant Orders    CBC    TSH    Comprehensive metabolic panel    Lipid panel    Hemoglobin A1c    Microalbumin / creatinine urine ratio    Encounter for immunization        Candidiasis        Relevant Medications    fluconazole (DIFLUCAN) 150 MG tablet       I have discontinued Ms. Kwan's desvenlafaxine and FLUoxetine. I am also having her start on FLUoxetine and fluconazole.  Additionally, I am having her maintain her Saline, CONTOUR BLOOD GLUCOSE SYSTEM, fexofenadine, BAYER MICROLET LANCETS, glucose blood, ibuprofen, fluticasone, montelukast, omeprazole, ranitidine, promethazine, cyclobenzaprine, metoprolol succinate, metFORMIN, meloxicam, atorvastatin, SUMAtriptan, amitriptyline, ALPRAZolam, and HYDROcodone-acetaminophen.  Meds ordered this encounter  Medications  . ALPRAZolam (XANAX) 0.5 MG tablet    Sig: Take 1 tablet (0.5 mg total) by mouth 3 (three) times daily as needed for anxiety.    Dispense:  90 tablet    Refill:  1  . HYDROcodone-acetaminophen (NORCO/VICODIN) 5-325 MG tablet    Sig: Take 1 tablet by mouth every 6 (six) hours as needed.  Dispense:  100 tablet    Refill:  0  . FLUoxetine (PROZAC) 40 MG capsule    Sig: Take 1 capsule (40 mg total) by mouth daily.    Dispense:  30 capsule    Refill:  3  . fluconazole (DIFLUCAN) 150 MG tablet    Sig: Take 1 tablet (150 mg total) by mouth once a week.    Dispense:  2 tablet    Refill:  02     Danise EdgeBLYTH, Harlean Regula, MD

## 2015-07-08 NOTE — Assessment & Plan Note (Signed)
Encouraged good sleep hygiene such as dark, quiet room. No blue/green glowing lights such as computer screens in bedroom. No alcohol or stimulants in evening. Cut down on caffeine as able. Regular exercise is helpful but not just prior to bed time.  

## 2015-07-08 NOTE — Assessment & Plan Note (Signed)
Well controlled, no changes to meds. Encouraged heart healthy diet such as the DASH diet and exercise as tolerated.  °

## 2015-07-30 ENCOUNTER — Other Ambulatory Visit: Payer: Self-pay | Admitting: Family Medicine

## 2015-07-30 DIAGNOSIS — F419 Anxiety disorder, unspecified: Secondary | ICD-10-CM

## 2015-07-30 DIAGNOSIS — M25562 Pain in left knee: Secondary | ICD-10-CM

## 2015-07-30 DIAGNOSIS — G4700 Insomnia, unspecified: Secondary | ICD-10-CM

## 2015-07-30 DIAGNOSIS — E1169 Type 2 diabetes mellitus with other specified complication: Secondary | ICD-10-CM

## 2015-07-30 DIAGNOSIS — E669 Obesity, unspecified: Secondary | ICD-10-CM

## 2015-07-30 DIAGNOSIS — R1031 Right lower quadrant pain: Secondary | ICD-10-CM

## 2015-07-30 DIAGNOSIS — E782 Mixed hyperlipidemia: Secondary | ICD-10-CM

## 2015-07-30 MED ORDER — HYDROCODONE-ACETAMINOPHEN 5-325 MG PO TABS: 1.0000 | ORAL_TABLET | Freq: Four times a day (QID) | ORAL | Status: DC | PRN

## 2015-07-30 NOTE — Telephone Encounter (Signed)
Can be reached: [phone]  Reason for call: Pt needs refill on hydrocodone. She has a few days left but pt husband, Marcial Pacasimothy, will be in tomorrow and she would like us to give him the RX when he comes in.

## 2015-07-30 NOTE — Telephone Encounter (Signed)
Requesting:Hydrocodone Contract  10/17/2014 UDS Low risk Last OV  07/03/2015 Last Refill  3100 with 0 refills on 07/03/2015  Please Advise

## 2015-07-30 NOTE — Telephone Encounter (Signed)
Called the patient informed to pickup hardcopy at the front desk. 

## 2015-08-28 MED FILL — FLUoxetine HCL 40 MG CAPS: 40 | 30 days supply | Qty: 30 | Fill #1

## 2015-08-28 MED FILL — MONTELUKAST SOD 10 MG TAB: 10 | 30 days supply | Qty: 30 | Fill #3

## 2015-08-31 ENCOUNTER — Other Ambulatory Visit: Payer: Self-pay | Admitting: Family Medicine

## 2015-08-31 DIAGNOSIS — M25562 Pain in left knee: Secondary | ICD-10-CM

## 2015-08-31 DIAGNOSIS — E782 Mixed hyperlipidemia: Secondary | ICD-10-CM

## 2015-08-31 DIAGNOSIS — F419 Anxiety disorder, unspecified: Secondary | ICD-10-CM

## 2015-08-31 DIAGNOSIS — E669 Obesity, unspecified: Secondary | ICD-10-CM

## 2015-08-31 DIAGNOSIS — E1169 Type 2 diabetes mellitus with other specified complication: Secondary | ICD-10-CM

## 2015-08-31 DIAGNOSIS — G4700 Insomnia, unspecified: Secondary | ICD-10-CM

## 2015-08-31 DIAGNOSIS — R1031 Right lower quadrant pain: Secondary | ICD-10-CM

## 2015-08-31 MED ORDER — HYDROCODONE-ACETAMINOPHEN 5-325 MG PO TABS: 1.0000 | ORAL_TABLET | Freq: Four times a day (QID) | ORAL | Status: DC | PRN

## 2015-08-31 MED FILL — HYDROCODON-APAP 5-325: 5-325 | 25 days supply | Qty: 100 | Fill #0

## 2015-08-31 NOTE — Telephone Encounter (Signed)
Requesting:  Hydrocodone Contract   10/17/2014 UDS   Low RIsk Last OV   07/03/2015 Last Refill   #100 with 0 refills on 07/30/2015  Please Advise

## 2015-08-31 NOTE — Telephone Encounter (Signed)
Caller name: Self  Can be reached: [phone]  Reason for call: Request refill on HYDROcodone-acetaminophen (NORCO/VICODIN) 5-325 MG tablet [[id number]]

## 2015-08-31 NOTE — Telephone Encounter (Signed)
Called the patient informed to pickup hardcopy of hydrocodone at the front desk. 

## 2015-09-07 ENCOUNTER — Other Ambulatory Visit: Payer: Self-pay | Admitting: Family Medicine

## 2015-09-07 MED FILL — OMEPRAZOLE DR 20 MG CAPSULE: 20 | 30 days supply | Qty: 60 | Fill #1

## 2015-09-07 MED FILL — ALPRAZolam 0.5 MG TABS: 0.5 | 30 days supply | Qty: 90 | Fill #1

## 2015-09-07 MED FILL — metFORMIN HCL 500 MG TABS: 500 | 30 days supply | Qty: 90 | Fill #0

## 2015-09-07 MED FILL — METOPROLOL SUCC ER 50 MG TA: 50 | 30 days supply | Qty: 30 | Fill #0 | Status: TO

## 2015-09-17 ENCOUNTER — Other Ambulatory Visit (INDEPENDENT_AMBULATORY_CARE_PROVIDER_SITE_OTHER): Payer: Self-pay

## 2015-09-17 DIAGNOSIS — G4700 Insomnia, unspecified: Secondary | ICD-10-CM

## 2015-09-17 DIAGNOSIS — I10 Essential (primary) hypertension: Secondary | ICD-10-CM

## 2015-09-17 DIAGNOSIS — E785 Hyperlipidemia, unspecified: Secondary | ICD-10-CM

## 2015-09-17 DIAGNOSIS — E1169 Type 2 diabetes mellitus with other specified complication: Secondary | ICD-10-CM

## 2015-09-17 DIAGNOSIS — R002 Palpitations: Secondary | ICD-10-CM

## 2015-09-17 DIAGNOSIS — E119 Type 2 diabetes mellitus without complications: Secondary | ICD-10-CM

## 2015-09-17 DIAGNOSIS — R1031 Right lower quadrant pain: Secondary | ICD-10-CM

## 2015-09-17 DIAGNOSIS — F419 Anxiety disorder, unspecified: Secondary | ICD-10-CM

## 2015-09-17 DIAGNOSIS — E782 Mixed hyperlipidemia: Secondary | ICD-10-CM

## 2015-09-17 DIAGNOSIS — E669 Obesity, unspecified: Secondary | ICD-10-CM

## 2015-09-17 DIAGNOSIS — M25562 Pain in left knee: Secondary | ICD-10-CM

## 2015-09-18 LAB — CBC
HEMATOCRIT: 38.3 % (ref 36.0–46.0)
HEMOGLOBIN: 12.2 g/dL (ref 12.0–15.0)
MCHC: 31.9 g/dL (ref 30.0–36.0)
MCV: 87.2 fl (ref 78.0–100.0)
PLATELETS: 201 10*3/uL (ref 150.0–400.0)
RBC: 4.39 Mil/uL (ref 3.87–5.11)
RDW: 16.3 % — AB (ref 11.5–15.5)
WBC: 5.3 10*3/uL (ref 4.0–10.5)

## 2015-09-18 LAB — COMPREHENSIVE METABOLIC PANEL
ALK PHOS: 160 U/L — AB (ref 39–117)
ALT: 19 U/L (ref 0–35)
AST: 15 U/L (ref 0–37)
Albumin: 4.3 g/dL (ref 3.5–5.2)
BUN: 11 mg/dL (ref 6–23)
CALCIUM: 10 mg/dL (ref 8.4–10.5)
CO2: 28 meq/L (ref 19–32)
Chloride: 105 mEq/L (ref 96–112)
Creatinine, Ser: 0.7 mg/dL (ref 0.40–1.20)
GFR: 118.42 mL/min (ref >60.00)
GLUCOSE: 79 mg/dL (ref 70–99)
POTASSIUM: 3.8 meq/L (ref 3.5–5.1)
Sodium: 138 mEq/L (ref 135–145)
TOTAL PROTEIN: 7.7 g/dL (ref 6.0–8.3)
Total Bilirubin: 0.3 mg/dL (ref 0.2–1.2)

## 2015-09-18 LAB — HEMOGLOBIN A1C: Hgb A1c MFr Bld: 7.6 % — ABNORMAL HIGH (ref 4.6–6.5)

## 2015-09-18 LAB — LIPID PANEL
Cholesterol: 131 mg/dL (ref 0–200)
HDL: 61.9 mg/dL (ref >39.00)
LDL Cholesterol: 57 mg/dL (ref 0–99)
NONHDL: 68.85
TRIGLYCERIDES: 61 mg/dL (ref 0.0–149.0)
Total CHOL/HDL Ratio: 2
VLDL: 12.2 mg/dL (ref 0.0–40.0)

## 2015-09-18 LAB — MICROALBUMIN / CREATININE URINE RATIO
CREATININE, U: 74.2 mg/dL
MICROALB/CREAT RATIO: 0.9 mg/g (ref 0.0–30.0)
Microalb, Ur: <0.7 mg/dL (ref 0.0–1.9)

## 2015-09-18 LAB — TSH: TSH: 1.14 u[IU]/mL (ref 0.35–4.50)

## 2015-10-09 ENCOUNTER — Other Ambulatory Visit: Payer: Self-pay | Admitting: Family Medicine

## 2015-10-09 DIAGNOSIS — M25562 Pain in left knee: Secondary | ICD-10-CM

## 2015-10-09 DIAGNOSIS — R1031 Right lower quadrant pain: Secondary | ICD-10-CM

## 2015-10-09 DIAGNOSIS — E782 Mixed hyperlipidemia: Secondary | ICD-10-CM

## 2015-10-09 DIAGNOSIS — E1169 Type 2 diabetes mellitus with other specified complication: Secondary | ICD-10-CM

## 2015-10-09 DIAGNOSIS — E669 Obesity, unspecified: Secondary | ICD-10-CM

## 2015-10-09 DIAGNOSIS — G4700 Insomnia, unspecified: Secondary | ICD-10-CM

## 2015-10-09 DIAGNOSIS — F419 Anxiety disorder, unspecified: Secondary | ICD-10-CM

## 2015-10-09 MED FILL — ATORVASTATIN 10 MG TABLET: 10 | 90 days supply | Qty: 90 | Fill #2

## 2015-10-09 MED FILL — MONTELUKAST SOD 10 MG TAB: 10 | 30 days supply | Qty: 30 | Fill #0 | Status: TO

## 2015-10-09 NOTE — Telephone Encounter (Signed)
Caller name: Self  Can be reached: [phone]   Reason for call: Request refill on HYDROcodone-acetaminophen (NORCO/VICODIN) 5-325 MG tablet [[id number]]

## 2015-10-09 NOTE — Telephone Encounter (Signed)
Requesting: Hydrocodone Contract  10/17/14 UDS  Low risk, due Last OV  07/03/15 Last Refill #100 with 0 refills on 08/31/15  Please Advise

## 2015-10-10 MED ORDER — HYDROCODONE-ACETAMINOPHEN 5-325 MG PO TABS: 1.0000 | ORAL_TABLET | Freq: Four times a day (QID) | ORAL | Status: DC | PRN

## 2015-10-12 ENCOUNTER — Telehealth: Payer: Self-pay | Admitting: Family Medicine

## 2015-10-12 ENCOUNTER — Other Ambulatory Visit: Payer: Self-pay | Admitting: Family Medicine

## 2015-10-12 DIAGNOSIS — R002 Palpitations: Secondary | ICD-10-CM

## 2015-10-12 MED ORDER — ALPRAZOLAM 0.5 MG PO TABS: 0.5000 mg | ORAL_TABLET | Freq: Three times a day (TID) | ORAL | Status: DC | PRN

## 2015-10-12 MED FILL — FLUoxetine HCL 40 MG CAPS: 40 | 30 days supply | Qty: 30 | Fill #2

## 2015-10-12 MED FILL — OMEPRAZOLE DR 20 MG CAPSULE: 20 | 30 days supply | Qty: 60 | Fill #2

## 2015-10-12 MED FILL — AMITRIPTYLINE HCL 10 MG TAB: 10 | 30 days supply | Qty: 60 | Fill #2

## 2015-10-12 MED FILL — METOPROLOL SUCC ER 50 MG TA: 50 | 30 days supply | Qty: 30 | Fill #1 | Status: TO

## 2015-10-12 MED FILL — HYDROCODON-APAP 5-325: 5-325 | 25 days supply | Qty: 100 | Fill #0

## 2015-10-12 NOTE — Telephone Encounter (Signed)
Pt dropped off paperwork to be filled out Physiological scientist(Office Of Public Relationship and Communication Form)

## 2015-10-12 NOTE — Telephone Encounter (Signed)
Pt gave the wrong document and brought back the Health Screen form to be filled out.

## 2015-10-12 NOTE — Telephone Encounter (Signed)
PCP did ok to refill and [facility].

## 2015-10-16 NOTE — Telephone Encounter (Signed)
Received Health Screening For; completed and forwarded to provider for sig/SLS 03/07

## 2015-10-17 ENCOUNTER — Telehealth: Payer: Self-pay | Admitting: Family Medicine

## 2015-10-17 NOTE — Telephone Encounter (Signed)
Caller name: Self  Can be reached: [phone]   Reason for call: Patient called to inform Dr. B that the date on her form can be left as they are OR dated back. Either way [facility]

## 2015-10-17 NOTE — Telephone Encounter (Signed)
Faxed form to [phone].    Patient informed that form faxed.  Copied/scanned and mailed to the patient as well. paitent aware.

## 2015-10-23 ENCOUNTER — Telehealth: Payer: Self-pay | Admitting: Family Medicine

## 2015-10-23 NOTE — Telephone Encounter (Signed)
Called the patient to inform she failed her UDS.  PCP did offer to have rechecked in one month. Scheduled the patient a lab appointment for 11/23/2015 for a repeat UDS.

## 2015-11-02 ENCOUNTER — Telehealth: Payer: Self-pay | Admitting: Family Medicine

## 2015-11-02 ENCOUNTER — Ambulatory Visit: Payer: Self-pay | Admitting: Family Medicine

## 2015-11-02 MED ORDER — SUMATRIPTAN SUCCINATE 50 MG PO TABS: ORAL_TABLET | ORAL | Status: DC

## 2015-11-02 NOTE — Telephone Encounter (Signed)
Caller name: Self  Can be reached: [phone] Pharmacy:  [id number] - [place], [place] - [address] AT [facility] [phone] (Phone) [phone] (Fax)        Reason for call: Refill SUMAtriptan (IMITREX) 50 MG tablet

## 2015-11-02 NOTE — Telephone Encounter (Signed)
[facility] - Client TELEPHONE ADVICE RECORD [facility] Patient Name: Nicole SabinISHIA Ball DOB: 04/08/1976 Initial Comment Caller States she has dull jaw pain that is moving towards her ears, severe headache, throat burning, she does suffer from migraines. did just eat but had the samething she has had before. Nurse Assessment Nurse: Izola PriceMyers, RN, Cala BradfordKimberly Date/Time Lamount Cohen(Eastern Time): 11/02/2015 3:37:19 PM Confirm and document reason for call. If symptomatic, describe symptoms. You must click the next button to save text entered. ---Caller States she has dull jaw pain that is moving towards her ears, severe headache, throat burning, she does suffer from migraines. did just eat but had eaten a sauce that she had never had. throat isn't closing up but is just sore. No chest pain. Dull pain from headache. Has the patient traveled out of the country within the last 30 days? ---Not Applicable Does the patient have any new or worsening symptoms? ---Yes Will a triage be completed? ---Yes Related visit to physician within the last 2 weeks? ---No Does the PT have any chronic conditions? (i.e. diabetes, asthma, etc.) ---Yes List chronic conditions. ---htn, diabetic, Is the patient pregnant or possibly pregnant? (Ask all females between the ages of 8512-55) ---No Is this a behavioral health or substance abuse call? ---No Guidelines Guideline Title Affirmed Question Affirmed Notes Headache Loss of vision or double vision (Exception: same as prior migraines) Final Disposition User Go to ED Now (or PCP triage) Izola PriceMyers, RN, Cala BradfordKimberly Referrals GO TO FACILITY REFUSED Disagree/Comply: Disagree Disagree/Comply Reason: Wait and see

## 2015-11-02 NOTE — Telephone Encounter (Signed)
Pt called in stating she has a severe headache, throat burning, and jaw pain. [facility] call to [facility].

## 2015-11-06 ENCOUNTER — Telehealth: Payer: Self-pay | Admitting: Behavioral Health

## 2015-11-06 NOTE — Telephone Encounter (Signed)
[facility] note received via fax  Call Date: 11/02/15  Time: 3:37 PM   Caller: Kelise Z. Valentina LucksGriffin Return number: [phone]  Nurse: Karen ChafeKimberly Myers, RN  Chief Complaint: Headache  Reason for call: Caller states she has dull jaw pain that is moving towards her ears, severe headache, throat burning, she does suffer from migraines; did just eat but had eaten a sauce that she had never had; throat isn't closing up but is just sore. No chest pain. Dull pain from headache.  Related visit to physician within the last 2 weeks: No  Guideline: Headache; loss of vision or double vision (Exception: same as prior migraines)  Disposition: Go to ED Now (or PCP triage)  Disagree/Comply: Disagree  Disagree/Comply Reason: Wait and see    Attempted to reach patient regarding the above symptoms. Left message for patient to return call when available.   Patient has a follow-up visit scheduled for 11/08/15 at 3:15 PM.

## 2015-11-08 ENCOUNTER — Ambulatory Visit: Payer: Self-pay | Admitting: Family Medicine

## 2015-11-08 ENCOUNTER — Telehealth: Payer: Self-pay | Admitting: Family Medicine

## 2015-11-08 MED FILL — FLUoxetine HCL 40 MG CAPS: 40 | 30 days supply | Qty: 30 | Fill #3

## 2015-11-08 MED FILL — ALPRAZolam 0.5 MG TABS: 0.5 | 30 days supply | Qty: 90 | Fill #0 | Status: TO

## 2015-11-08 MED FILL — MONTELUKAST SOD 10 MG TAB: 10 | 30 days supply | Qty: 30 | Fill #1 | Status: TO

## 2015-11-08 MED FILL — OMEPRAZOLE DR 20 MG CAPSULE: 20 | 30 days supply | Qty: 60 | Fill #3

## 2015-11-08 MED FILL — METOPROLOL SUCC ER 50 MG TA: 50 | 30 days supply | Qty: 30 | Fill #2 | Status: TO

## 2015-11-14 NOTE — Telephone Encounter (Signed)
Pt was no show for follow up 11/08/15 3:15pm, pt has not rescheduled, 2 no show w/in 12 months, charge or no charge?

## 2015-11-14 NOTE — Telephone Encounter (Signed)
charge 

## 2015-11-15 ENCOUNTER — Encounter: Payer: Self-pay | Admitting: Family Medicine

## 2015-11-15 NOTE — Telephone Encounter (Signed)
Marked to charge and mailing no show letter °

## 2015-11-22 ENCOUNTER — Telehealth: Payer: Self-pay | Admitting: Family Medicine

## 2015-11-22 DIAGNOSIS — F419 Anxiety disorder, unspecified: Secondary | ICD-10-CM

## 2015-11-22 DIAGNOSIS — E782 Mixed hyperlipidemia: Secondary | ICD-10-CM

## 2015-11-22 DIAGNOSIS — G4700 Insomnia, unspecified: Secondary | ICD-10-CM

## 2015-11-22 DIAGNOSIS — R1031 Right lower quadrant pain: Secondary | ICD-10-CM

## 2015-11-22 DIAGNOSIS — E1169 Type 2 diabetes mellitus with other specified complication: Secondary | ICD-10-CM

## 2015-11-22 DIAGNOSIS — M25562 Pain in left knee: Secondary | ICD-10-CM

## 2015-11-22 DIAGNOSIS — E669 Obesity, unspecified: Secondary | ICD-10-CM

## 2015-11-22 MED ORDER — HYDROCODONE-ACETAMINOPHEN 5-325 MG PO TABS: 1.0000 | ORAL_TABLET | Freq: Four times a day (QID) | ORAL | Status: DC | PRN

## 2015-11-22 NOTE — Telephone Encounter (Signed)
Pt is requesting refill on Hydrocodone. Dr. Mariel AloeBlyth's Pt.  Last OV: 07/03/2015 Last Fill: 10/10/2015 #100 and 0RF Pt sig: 1 tablet q6h PRN UDS: 10/17/2014 Low risk, UDS sampled given 10/12/2015 pending results  Please advise.

## 2015-11-22 NOTE — Telephone Encounter (Signed)
Received request to refill her hydrocodone NCCSR shows her last fill of hydrocodone 5mg , #100 on 3/3. She also received xanax on a regular basis.   No Rx not from Dr. Abner GreenspanBlyth. Will do her RF today as Dr. Abner GreenspanBlyth is away Called pt and let her know she can pick up her rx at the front desk Forwarded to Dr. Abner GreenspanBlyth  Meds ordered this encounter  Medications  . HYDROcodone-acetaminophen (NORCO/VICODIN) 5-325 MG tablet    Sig: Take 1 tablet by mouth every 6 (six) hours as needed.    Dispense:  100 tablet    Refill:  0

## 2015-11-22 NOTE — Telephone Encounter (Signed)
Relation to WU:JWJXpt:self  Call back number:[phone]   Reason for call:  Patient requesting a refill HYDROcodone-acetaminophen (NORCO/VICODIN) 5-325 MG tablet

## 2015-11-23 ENCOUNTER — Other Ambulatory Visit: Payer: Self-pay

## 2015-11-30 ENCOUNTER — Telehealth: Payer: Self-pay | Admitting: Family Medicine

## 2015-11-30 ENCOUNTER — Other Ambulatory Visit: Payer: Self-pay | Admitting: Family Medicine

## 2015-11-30 ENCOUNTER — Other Ambulatory Visit: Payer: Self-pay

## 2015-11-30 MED ORDER — METFORMIN HCL 500 MG PO TABS: 500.0000 mg | ORAL_TABLET | Freq: Three times a day (TID) | ORAL | Status: DC

## 2015-11-30 NOTE — Telephone Encounter (Signed)
Prescription sent in and patient aware 

## 2015-11-30 NOTE — Telephone Encounter (Signed)
Caller name: Nicole Ball Relation to ZO:XWRUpt:self Call back number: [phone] Pharmacy: [facility] on [address]  Reason for call: Pt is needing refill on rx for metFORMIN (GLUCOPHAGE) 500 MG tablet, [facility] but since she not coming back to this direction, wants [facility] rx [facility] [address] ([place] to home). Please advise.

## 2015-12-04 ENCOUNTER — Other Ambulatory Visit: Payer: Self-pay | Admitting: Family Medicine

## 2015-12-13 ENCOUNTER — Ambulatory Visit: Payer: Self-pay | Admitting: Family Medicine

## 2015-12-27 ENCOUNTER — Other Ambulatory Visit: Payer: Self-pay | Admitting: Family Medicine

## 2015-12-28 ENCOUNTER — Encounter: Payer: Self-pay | Admitting: Family Medicine

## 2015-12-28 ENCOUNTER — Ambulatory Visit (INDEPENDENT_AMBULATORY_CARE_PROVIDER_SITE_OTHER): Payer: 59 | Admitting: Family Medicine

## 2015-12-28 VITALS — BP 112/82 | HR 84 | Temp 98.4°F | Ht 63.0 in | Wt 207.1 lb

## 2015-12-28 DIAGNOSIS — F419 Anxiety disorder, unspecified: Secondary | ICD-10-CM | POA: Diagnosis not present

## 2015-12-28 DIAGNOSIS — M25562 Pain in left knee: Secondary | ICD-10-CM | POA: Diagnosis not present

## 2015-12-28 DIAGNOSIS — G4700 Insomnia, unspecified: Secondary | ICD-10-CM | POA: Diagnosis not present

## 2015-12-28 DIAGNOSIS — E782 Mixed hyperlipidemia: Secondary | ICD-10-CM

## 2015-12-28 DIAGNOSIS — E1169 Type 2 diabetes mellitus with other specified complication: Secondary | ICD-10-CM

## 2015-12-28 DIAGNOSIS — I10 Essential (primary) hypertension: Secondary | ICD-10-CM

## 2015-12-28 DIAGNOSIS — Z114 Encounter for screening for human immunodeficiency virus [HIV]: Secondary | ICD-10-CM

## 2015-12-28 DIAGNOSIS — R1031 Right lower quadrant pain: Secondary | ICD-10-CM

## 2015-12-28 DIAGNOSIS — Z23 Encounter for immunization: Secondary | ICD-10-CM | POA: Diagnosis not present

## 2015-12-28 DIAGNOSIS — E663 Overweight: Secondary | ICD-10-CM

## 2015-12-28 DIAGNOSIS — E119 Type 2 diabetes mellitus without complications: Secondary | ICD-10-CM | POA: Diagnosis not present

## 2015-12-28 DIAGNOSIS — R079 Chest pain, unspecified: Secondary | ICD-10-CM

## 2015-12-28 DIAGNOSIS — E669 Obesity, unspecified: Secondary | ICD-10-CM

## 2015-12-28 DIAGNOSIS — G43809 Other migraine, not intractable, without status migrainosus: Secondary | ICD-10-CM

## 2015-12-28 DIAGNOSIS — E785 Hyperlipidemia, unspecified: Secondary | ICD-10-CM

## 2015-12-28 DIAGNOSIS — T7840XD Allergy, unspecified, subsequent encounter: Secondary | ICD-10-CM

## 2015-12-28 DIAGNOSIS — R002 Palpitations: Secondary | ICD-10-CM

## 2015-12-28 MED ORDER — BUTALBITAL-ACETAMINOPHEN 50-325 MG PO TABS: 1.0000 | ORAL_TABLET | Freq: Two times a day (BID) | ORAL | Status: DC | PRN

## 2015-12-28 MED ORDER — ATORVASTATIN CALCIUM 10 MG PO TABS: 10.0000 mg | ORAL_TABLET | Freq: Every day | ORAL | Status: DC

## 2015-12-28 MED ORDER — HYDROCODONE-ACETAMINOPHEN 5-325 MG PO TABS: 1.0000 | ORAL_TABLET | Freq: Four times a day (QID) | ORAL | Status: DC | PRN

## 2015-12-28 MED ORDER — AMITRIPTYLINE HCL 10 MG PO TABS: 10.0000 mg | ORAL_TABLET | Freq: Every evening | ORAL | Status: DC | PRN

## 2015-12-28 MED ORDER — ALPRAZOLAM 0.5 MG PO TABS: 0.5000 mg | ORAL_TABLET | Freq: Three times a day (TID) | ORAL | Status: DC | PRN

## 2015-12-28 MED ORDER — METOPROLOL SUCCINATE ER 50 MG PO TB24: ORAL_TABLET | ORAL | Status: DC

## 2015-12-28 MED ORDER — FLUOXETINE HCL 40 MG PO CAPS: 40.0000 mg | ORAL_CAPSULE | Freq: Every day | ORAL | Status: DC

## 2015-12-28 MED ORDER — ESTROGENS, CONJUGATED 0.625 MG/GM VA CREA: 1.0000 | TOPICAL_CREAM | Freq: Every day | VAGINAL | Status: DC

## 2015-12-28 MED FILL — BUTALBITAL/ACETAMINOPHEN 50: 50-325 | 30 days supply | Qty: 60 | Fill #0 | Status: TO

## 2015-12-28 MED FILL — FLUoxetine HCL 40 MG CAPS: 40 | 30 days supply | Qty: 30 | Fill #0 | Status: TO

## 2015-12-28 MED FILL — METOPROLOL SUCC ER 50 MG TA: 50 | 30 days supply | Qty: 30 | Fill #0 | Status: TO

## 2015-12-28 MED FILL — AMITRIPTYLINE HCL 10 MG TAB: 10 | 30 days supply | Qty: 60 | Fill #0 | Status: TO

## 2015-12-28 MED FILL — ATORVASTATIN 10 MG TABLET: 10 | 30 days supply | Qty: 30 | Fill #0 | Status: TO

## 2015-12-28 NOTE — Patient Instructions (Addendum)
NOW Probiotic Vitamin.10 strains.  Available at [facility].   Basic Carbohydrate Counting for Diabetes Mellitus Carbohydrate counting is a method for keeping track of the amount of carbohydrates you eat. Eating carbohydrates naturally increases the level of sugar (glucose) in your blood, so it is important for you to know the amount that is okay for you to have in every meal. Carbohydrate counting helps keep the level of glucose in your blood within normal limits. The amount of carbohydrates allowed is different for every person. A dietitian can help you calculate the amount that is right for you. Once you know the amount of carbohydrates you can have, you can count the carbohydrates in the foods you want to eat. Carbohydrates are found in the following foods:  Grains, such as breads and cereals.  Dried beans and soy products.  Starchy vegetables, such as potatoes, peas, and corn.  Fruit and fruit juices.  Milk and yogurt.  Sweets and snack foods, such as cake, cookies, candy, chips, soft drinks, and fruit drinks. CARBOHYDRATE COUNTING There are two ways to count the carbohydrates in your food. You can use either of the methods or a combination of both. Reading the "Nutrition Facts" on Packaged Food The "Nutrition Facts" is an area that is included on the labels of almost all packaged food and beverages in the [place]. It includes the serving size of that food or beverage and information about the nutrients in each serving of the food, including the grams (g) of carbohydrate per serving.  Decide the number of servings of this food or beverage that you will be able to eat or drink. Multiply that number of servings by the number of grams of carbohydrate that is listed on the label for that serving. The total will be the amount of carbohydrates you will be having when you eat or drink this food or beverage. Learning Standard Serving Sizes of Food When you eat food that is not  packaged or does not include "Nutrition Facts" on the label, you need to measure the servings in order to count the amount of carbohydrates.A serving of most carbohydrate-rich foods contains about 15 g of carbohydrates. The following list includes serving sizes of carbohydrate-rich foods that provide 15 g ofcarbohydrate per serving:   1 slice of bread (1 oz) or 1 six-inch tortilla.    of a hamburger bun or English muffin.  4-6 crackers.   cup unsweetened dry cereal.    cup hot cereal.   cup rice or pasta.    cup mashed potatoes or  of a large baked potato.  1 cup fresh fruit or one small piece of fruit.    cup canned or frozen fruit or fruit juice.  1 cup milk.   cup plain fat-free yogurt or yogurt sweetened with artificial sweeteners.   cup cooked dried beans or starchy vegetable, such as peas, corn, or potatoes.  Decide the number of standard-size servings that you will eat. Multiply that number of servings by 15 (the grams of carbohydrates in that serving). For example, if you eat 2 cups of strawberries, you will have eaten 2 servings and 30 g of carbohydrates (2 servings x 15 g = 30 g). For foods such as soups and casseroles, in which more than one food is mixed in, you will need to count the carbohydrates in each food that is included. EXAMPLE OF CARBOHYDRATE COUNTING Sample Dinner  3 oz chicken breast.   cup of brown rice.   cup of  corn.  1 cup milk.   1 cup strawberries with sugar-free whipped topping.  Carbohydrate Calculation Step 1: Identify the foods that contain carbohydrates:   Rice.   Corn.   Milk.   Strawberries. Step 2:Calculate the number of servings eaten of each:   2 servings of rice.   1 serving of corn.   1 serving of milk.   1 serving of strawberries. Step 3: Multiply each of those number of servings by 15 g:   2 servings of rice x 15 g = 30 g.   1 serving of corn x 15 g = 15 g.   1 serving of milk x 15  g = 15 g.   1 serving of strawberries x 15 g = 15 g. Step 4: Add together all of the amounts to find the total grams of carbohydrates eaten: 30 g + 15 g + 15 g + 15 g = 75 g.   This information is not intended to replace advice given to you by your health care provider. Make sure you discuss any questions you have with your health care provider.   Document Released: 07/28/2005 Document Revised: 08/18/2014 Document Reviewed: 06/24/2013 Elsevier Interactive Patient Education [employer]

## 2015-12-28 NOTE — Assessment & Plan Note (Signed)
hgba1c acceptable, minimize simple carbs. Increase exercise as tolerated. Continue current meds 

## 2015-12-28 NOTE — Assessment & Plan Note (Signed)
Well controlled, no changes to meds. Encouraged heart healthy diet such as the DASH diet and exercise as tolerated.  °

## 2015-12-28 NOTE — Assessment & Plan Note (Signed)
Uses antihistamines daily, Singulair, Flonase daily and consider adding Nasal saline prn..Marland Kitchen

## 2015-12-28 NOTE — Progress Notes (Signed)
Pre visit review using our clinic review tool, if applicable. No additional management support is needed unless otherwise documented below in the visit note. 

## 2015-12-28 NOTE — Assessment & Plan Note (Signed)
Encouraged DASH diet, decrease po intake and increase exercise as tolerated. Needs 7-8 hours of sleep nightly. Avoid trans fats, eat small, frequent meals every 4-5 hours with lean proteins, complex carbs and healthy fats. Minimize simple carbs, GMO foods. 

## 2015-12-28 NOTE — Assessment & Plan Note (Signed)
Has been under a great deal of stress with her father and husband being very sick, a new job as a Veterinary surgeoncounselor at [employer] and was in school so she has not been eating, drinking well, sleeping well or exercising. Reminded to concentrate on that and given  Bupap to use prn due to symptoms can still use a Triptan prn and consider a neurology referral if persists

## 2015-12-28 NOTE — Progress Notes (Signed)
Subjective:    Patient ID: Nicole Ball, female    DOB: [birth year], 40 y.o.   MRN: [id number]  Chief Complaint  Patient presents with  . Follow-up    HPI Patient is in today for follow up.  Patient reports she has had worsening migraines that come in clusters and medication seems to be doing well but has not had enough to cover he ongoing episodes of headaches.  Patient has been struggling with heartburn more lately.  Patient has also been tolerating otc medications for allergies well.  Patient has still been having some issues with vaginal dryness.  Denies CP/palp/SOB/congestion/fevers/GI or GU c/o. Taking meds as prescribed.   Past Medical History  Diagnosis Date  . Allergy     seasonal  . Acute low back pain 12/19/2010  . Overweight(278.02) 10/16/2010  . SNORING 10/16/2010  . UTI'S, HX OF 10/16/2010  . INSOMNIA 10/16/2010  . HIATAL HERNIA WITH REFLUX, HX OF 10/16/2010  . ALLERGIC RHINITIS 10/16/2010  . Hyperlipidemia 04/21/2011  . Elevated BP 04/21/2011  . Sleep apnea 06/07/2011  . Anxiety and depression 06/07/2011  . Amenorrhea 07/11/2011  . Diabetes mellitus 10/30/2011  . Knee pain, left 03/11/2012  . Reflux 04/22/2012  . Acute sinusitis 08/06/2012  . Migraine 03/07/2013  . Cervical cancer screening 01/10/2014    Menarche at 8 Regular and moderate flow, became irregular until OCPs  history of abnormal pap in past, repeat was normal Last pap roughly 3 years ago G0P0, s/p No history of abnormal MGM, no h/o MGM  No concerns today no gyn surgeries LMP May 2013  . CTS (carpal tunnel syndrome) 01/15/2014  . Preventative health care 01/15/2014  . Abnormal serum level of alkaline phosphatase 05/21/2014  . Diabetes mellitus type 2 in obese ([place]) 10/30/2011  . Palpitations 01/28/2015    No past surgical history on file.  Family History  Problem Relation Age of Onset  . Lupus Mother   . Arthritis Maternal Grandmother   . Scleroderma Maternal Grandmother   . Glaucoma Maternal Grandmother   .  Cataracts Maternal Grandmother   . Cancer Maternal Grandfather     prostate  . Coronary artery disease Maternal Grandfather     s/p angioplasty  . Diabetes Maternal Grandfather     Type 2  . Cancer Paternal Grandmother     Breast ca- dx in 33's  . Thyroid disease Paternal Grandmother   . Diabetes Paternal Grandfather   . Thyroid disease Sister   . Asthma Sister   . Allergies Brother   . Arthritis Other     Social History   Social History  . Marital Status: Married    Spouse Name: N/A  . Number of Children: N/A  . Years of Education: N/A   Occupational History  . Not on file.   Social History Main Topics  . Smoking status: Current Some Day Smoker -- 0.10 packs/day    Types: Cigars  . Smokeless tobacco: Never Used     Comment: 1 black and milds  . Alcohol Use: Yes     Comment: twice/year  . Drug Use: No  . Sexual Activity: Not on file   Other Topics Concern  . Not on file   Social History Narrative   Married lives w/ husband, works at [employer] Prescriptions Prior to Visit  Medication Sig Dispense Refill  . BAYER MICROLET LANCETS lancets Use as directed to test daily and as needed for symptoms 100 each 1  .  Blood Glucose Monitoring Suppl (CONTOUR BLOOD GLUCOSE SYSTEM) DEVI Use as directed to test daily and as needed for symptoms 1 Device 0  . cyclobenzaprine (FLEXERIL) 10 MG tablet Take 1 tablet (10 mg total) by mouth 2 (two) times daily as needed for muscle spasms. 60 tablet 1  . fexofenadine (ALLEGRA) 180 MG tablet Take 1 tablet (180 mg total) by mouth daily as needed. 30 tablet 5  . fluconazole (DIFLUCAN) 150 MG tablet Take 1 tablet (150 mg total) by mouth once a week. 2 tablet 02  . fluticasone (FLONASE) 50 MCG/ACT nasal spray INHALE 2 SPRAYS IN EACH NOSTRIL ONCE DAILY 16 g 4  . glucose blood (BAYER CONTOUR TEST) test strip Use as directed to test daily and as needed for symptoms 100 each 5  . ibuprofen (ADVIL,MOTRIN) 800 MG tablet Take 1  tablet (800 mg total) by mouth 3 (three) times daily. 21 tablet 0  . meloxicam (MOBIC) 15 MG tablet TAKE 1 TABLET BY MOUTH DAILY 90 tablet 2  . metFORMIN (GLUCOPHAGE) 500 MG tablet TAKE 1 TABLET(500 MG) BY MOUTH THREE TIMES DAILY 90 tablet 0  . montelukast (SINGULAIR) 10 MG tablet TAKE 1 TABLET BY MOUTH DAILY AT BEDTIME 30 tablet 3  . omeprazole (PRILOSEC) 20 MG capsule TAKE ONE CAPSULE BY MOUTH TWICE DAILY AS NEEDED FOR REFLUX 60 capsule 6  . promethazine (PHENERGAN) 25 MG tablet Take 1 tablet (25 mg total) by mouth every 8 (eight) hours as needed for nausea or vomiting. 20 tablet 2  . ranitidine (ZANTAC) 300 MG tablet TAKE 1 TABLET BY MOUTH AT BEDTIME 30 tablet 6  . Saline 0.65 % (SOLN) SOLN Place 2 Squirts into the nose 2 (two) times daily. 1 Bottle 0  . SUMAtriptan (IMITREX) 50 MG tablet TAKE 1 TABLET BY MOUTH EVERY 2 HOURS AS NEEDED FOR MIGRAINE - NO MORE THAN 2 PER 24 HOURS 10 tablet 2  . ALPRAZolam (XANAX) 0.5 MG tablet Take 1 tablet (0.5 mg total) by mouth 3 (three) times daily as needed for anxiety. 90 tablet 1  . amitriptyline (ELAVIL) 10 MG tablet Take 1-2 tablets (10-20 mg total) by mouth at bedtime as needed for sleep. 60 tablet 2  . atorvastatin (LIPITOR) 10 MG tablet Take 1 tablet (10 mg total) by mouth daily. 90 tablet 3  . FLUoxetine (PROZAC) 40 MG capsule Take 1 capsule (40 mg total) by mouth daily. 30 capsule 3  . HYDROcodone-acetaminophen (NORCO/VICODIN) 5-325 MG tablet Take 1 tablet by mouth every 6 (six) hours as needed. 100 tablet 0  . metoprolol succinate (TOPROL-XL) 50 MG 24 hr tablet TAKE 1 TABLET BY MOUTH DAILY TAKE WITH OR IMMEDIATELY FOLLOWING A MEAL 30 tablet 2  . metFORMIN (GLUCOPHAGE) 500 MG tablet TAKE 1 TABLET BY MOUTH 3 TIMES DAILY 90 tablet 0   No facility-administered medications prior to visit.    No Known Allergies  Review of Systems  Constitutional: Negative for fever and malaise/fatigue.  HENT: Negative for congestion.   Eyes: Negative for blurred  vision.  Respiratory: Negative for shortness of breath.   Cardiovascular: Negative for chest pain, palpitations and leg swelling.  Gastrointestinal: Negative for nausea, abdominal pain and blood in stool.  Genitourinary: Negative for dysuria and frequency.  Musculoskeletal: Negative for falls.  Skin: Negative for rash.  Neurological: Positive for headaches. Negative for dizziness and loss of consciousness.  Endo/Heme/Allergies: Negative for environmental allergies.  Psychiatric/Behavioral: Negative for depression. The patient is not nervous/anxious.        Objective:  Physical Exam  Constitutional: She is oriented to person, place, and time. She appears well-developed and well-nourished. No distress.  HENT:  Head: Normocephalic and atraumatic.  Eyes: Conjunctivae are normal.  Neck: Neck supple. No thyromegaly present.  Cardiovascular: Normal rate, regular rhythm and normal heart sounds.   No murmur heard. Pulmonary/Chest: Effort normal and breath sounds normal. No respiratory distress.  Abdominal: Soft. Bowel sounds are normal. She exhibits no distension and no mass. There is no tenderness.  Musculoskeletal: She exhibits no edema.  Lymphadenopathy:    She has no cervical adenopathy.  Neurological: She is alert and oriented to person, place, and time.  Skin: Skin is warm and dry.  Psychiatric: She has a normal mood and affect. Her behavior is normal.    BP 112/82 mmHg  Pulse 84  Temp(Src) 98.4 F (36.9 C) (Oral)  Ht _0  (1.6 m)  Wt 207 lb 2 oz (93.951 kg)  BMI 36.70 kg/m2  SpO2 99%  LMP 12/10/2012 Wt Readings from Last 3 Encounters:  12/28/15 207 lb 2 oz (93.951 kg)  07/03/15 206 lb 6 oz (93.611 kg)  05/21/15 208 lb (94.348 kg)     Lab Results  Component Value Date   WBC 5.3 09/17/2015   HGB 12.2 09/17/2015   HCT 38.3 09/17/2015   PLT 201.0 09/17/2015   GLUCOSE 79 09/17/2015   CHOL 131 09/17/2015   TRIG 61.0 09/17/2015   HDL 61.90 09/17/2015   LDLCALC 57  09/17/2015   ALT 19 09/17/2015   AST 15 09/17/2015   NA 138 09/17/2015   K 3.8 09/17/2015   CL 105 09/17/2015   CREATININE 0.70 09/17/2015   BUN 11 09/17/2015   CO2 28 09/17/2015   TSH 1.14 09/17/2015   HGBA1C 7.6* 09/17/2015   MICROALBUR <0.7 09/17/2015    Lab Results  Component Value Date   TSH 1.14 09/17/2015   Lab Results  Component Value Date   WBC 5.3 09/17/2015   HGB 12.2 09/17/2015   HCT 38.3 09/17/2015   MCV 87.2 09/17/2015   PLT 201.0 09/17/2015   Lab Results  Component Value Date   NA 138 09/17/2015   K 3.8 09/17/2015   CO2 28 09/17/2015   GLUCOSE 79 09/17/2015   BUN 11 09/17/2015   CREATININE 0.70 09/17/2015   BILITOT 0.3 09/17/2015   ALKPHOS 160* 09/17/2015   AST 15 09/17/2015   ALT 19 09/17/2015   PROT 7.7 09/17/2015   ALBUMIN 4.3 09/17/2015   CALCIUM 10.0 09/17/2015   GFR 118.42 09/17/2015   Lab Results  Component Value Date   CHOL 131 09/17/2015   Lab Results  Component Value Date   HDL 61.90 09/17/2015   Lab Results  Component Value Date   LDLCALC 57 09/17/2015   Lab Results  Component Value Date   TRIG 61.0 09/17/2015   Lab Results  Component Value Date   CHOLHDL 2 09/17/2015   Lab Results  Component Value Date   HGBA1C 7.6* 09/17/2015       Assessment & Plan:   Problem List Items Addressed This Visit    Palpitations    Occasional episodes and tachycardia is noted. Metoprolol XL 50 mg daily      Relevant Medications   ALPRAZolam (XANAX) 0.5 MG tablet   atorvastatin (LIPITOR) 10 MG tablet   Other Relevant Orders   HIV antibody (with reflex)   CBC   Hemoglobin A1c   Comp Met (CMET)   Lipid panel   TSH   Overweight  Encouraged DASH diet, decrease po intake and increase exercise as tolerated. Needs 7-8 hours of sleep nightly. Avoid trans fats, eat small, frequent meals every 4-5 hours with lean proteins, complex carbs and healthy fats. Minimize simple carbs, GMO foods.      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 Reminded to concentrate on that and given  Bupap to use prn due to symptoms can still use a Triptan prn and consider a neurology referral if persists      Relevant Medications   metoprolol succinate (TOPROL-XL) 50 MG 24 hr tablet   FLUoxetine (PROZAC) 40 MG capsule   amitriptyline (ELAVIL) 10 MG tablet   HYDROcodone-acetaminophen (NORCO/VICODIN) 5-325 MG tablet   atorvastatin (LIPITOR) 10 MG tablet   ACETAMINOPHEN-BUTALBITAL 50-325 MG TABS   Other Relevant Orders   HIV antibody (with reflex)   CBC   Hemoglobin A1c   Comp Met (CMET)   Lipid panel   TSH   Knee pain, left   Relevant Medications   HYDROcodone-acetaminophen (NORCO/VICODIN) 5-325 MG tablet   Other Relevant Orders   HIV antibody (with reflex)   CBC   Hemoglobin A1c   Comp Met (CMET)   Lipid panel   TSH   Hyperlipidemia   Relevant Medications   metoprolol succinate (TOPROL-XL) 50 MG 24 hr tablet   atorvastatin (LIPITOR) 10 MG tablet   Other Relevant Orders   HIV antibody (with reflex)   CBC   Hemoglobin A1c   Comp Met (CMET)   Lipid panel   TSH   HTN (hypertension)    Well controlled, no changes to meds. Encouraged heart healthy diet such as the DASH diet and exercise as tolerated.       Relevant Medications   metoprolol succinate (TOPROL-XL) 50 MG 24 hr tablet   atorvastatin (LIPITOR) 10 MG tablet   Other Relevant Orders   HIV antibody (with reflex)   CBC   Hemoglobin A1c   Comp Met (CMET)   Lipid panel   TSH   Heart palpitations    Infrequent at this time.      Diabetes mellitus type 2 in obese ([facility])    hgba1c acceptable, minimize simple carbs. Increase exercise as tolerated. Continue current  meds      Relevant Medications   HYDROcodone-acetaminophen (NORCO/VICODIN) 5-325 MG tablet   atorvastatin (LIPITOR) 10 MG tablet   Other Relevant Orders   HIV antibody (with reflex)   CBC   Hemoglobin A1c   Comp Met (CMET)   Lipid panel   TSH   Chest pain   Relevant Medications   atorvastatin (LIPITOR) 10 MG tablet   Other Relevant Orders   HIV antibody (with reflex)   CBC   Hemoglobin A1c   Comp Met (CMET)   Lipid panel   TSH   Allergic state    Uses antihistamines daily, Singulair, Flonase daily and consider adding Nasal saline prn..       Relevant Orders   HIV antibody (with reflex)   CBC   Hemoglobin A1c   Comp Met (CMET)   Lipid panel   TSH  Other Visit Diagnoses    Anxiety    -  Primary    Relevant Medications    FLUoxetine (PROZAC) 40 MG capsule    amitriptyline (ELAVIL) 10 MG tablet    HYDROcodone-acetaminophen (NORCO/VICODIN) 5-325 MG tablet    ALPRAZolam (XANAX) 0.5 MG tablet    Other Relevant Orders    HIV antibody (with reflex)    CBC    Hemoglobin A1c    Comp Met (CMET)    Lipid panel    TSH    Insomnia        Relevant Medications    HYDROcodone-acetaminophen (NORCO/VICODIN) 5-325 MG tablet    Other Relevant Orders    HIV antibody (with reflex)    CBC    Hemoglobin A1c    Comp Met (CMET)    Lipid panel    TSH    Hyperlipidemia, mixed        Relevant Medications    metoprolol succinate (TOPROL-XL) 50 MG 24 hr tablet    HYDROcodone-acetaminophen (NORCO/VICODIN) 5-325 MG tablet    atorvastatin (LIPITOR) 10 MG tablet    Other Relevant Orders    HIV antibody (with reflex)    CBC    Hemoglobin A1c    Comp Met (CMET)    Lipid panel    TSH    RLQ discomfort        Relevant Medications    HYDROcodone-acetaminophen (NORCO/VICODIN) 5-325 MG tablet    Other Relevant Orders    HIV antibody (with reflex)    CBC    Hemoglobin A1c    Comp Met (CMET)    Lipid panel    TSH    Need for vaccination with 13-polyvalent pneumococcal conjugate  vaccine        Relevant Orders    Pneumococcal conjugate vaccine 13-valent IM (Completed)    HIV antibody (with reflex)    CBC    Hemoglobin A1c    Comp Met (CMET)    Lipid panel    TSH    Screening for HIV (human immunodeficiency virus)        Relevant Orders    HIV antibody (with reflex)    CBC    Hemoglobin A1c    Comp Met (CMET)    Lipid panel    TSH       I am having Ms. Mannella start on ACETAMINOPHEN-BUTALBITAL and conjugated estrogens. I am also having her maintain her Saline, CONTOUR BLOOD GLUCOSE SYSTEM, fexofenadine, BAYER MICROLET LANCETS, glucose blood, ibuprofen, fluticasone, omeprazole, ranitidine, promethazine, cyclobenzaprine, meloxicam, fluconazole, montelukast, SUMAtriptan, metFORMIN, metoprolol succinate, FLUoxetine, amitriptyline, HYDROcodone-acetaminophen, ALPRAZolam, and atorvastatin.  Meds ordered this encounter  Medications  . metoprolol succinate (TOPROL-XL) 50 MG 24 hr tablet    Sig: TAKE 1 TABLET BY MOUTH DAILY TAKE WITH OR IMMEDIATELY FOLLOWING A MEAL    Dispense:  30 tablet    Refill:  5  . FLUoxetine (PROZAC) 40 MG capsule    Sig: Take 1 capsule (40 mg total) by mouth daily.    Dispense:  30 capsule    Refill:  5  . amitriptyline (ELAVIL) 10 MG tablet    Sig: Take 1-2 tablets (10-20 mg total) by mouth at bedtime as needed for sleep.    Dispense:  60 tablet    Refill:  2  . HYDROcodone-acetaminophen (NORCO/VICODIN) 5-325 MG tablet    Sig: Take 1 tablet by mouth every 6 (six) hours as needed.    Dispense:  100 tablet    Refill:  0  .  ALPRAZolam (XANAX) 0.5 MG tablet    Sig: Take 1 tablet (0.5 mg total) by mouth 3 (three) times daily as needed for anxiety.    Dispense:  90 tablet    Refill:  1  . atorvastatin (LIPITOR) 10 MG tablet    Sig: Take 1 tablet (10 mg total) by mouth daily.    Dispense:  90 tablet    Refill:  3  . ACETAMINOPHEN-BUTALBITAL 50-325 MG TABS    Sig: Take 1 tablet by mouth 2 (two) times daily as needed (HEADACHES).     Dispense:  120 each    Refill:  0  . conjugated estrogens (PREMARIN) vaginal cream    Sig: Place 1 Applicatorful vaginally daily.    Dispense:  42.5 g    Refill:  12     Penni Homans, MD

## 2015-12-31 NOTE — Assessment & Plan Note (Signed)
Occasional episodes and tachycardia is noted. Metoprolol XL 50 mg daily

## 2015-12-31 NOTE — Assessment & Plan Note (Signed)
Infrequent at this time. 

## 2016-01-31 ENCOUNTER — Telehealth: Payer: Self-pay | Admitting: Family Medicine

## 2016-01-31 MED ORDER — RANITIDINE HCL 300 MG PO TABS: 300.0000 mg | ORAL_TABLET | Freq: Every day | ORAL | Status: DC

## 2016-01-31 MED ORDER — FEXOFENADINE HCL 180 MG PO TABS: 180.0000 mg | ORAL_TABLET | Freq: Every day | ORAL | Status: DC | PRN

## 2016-01-31 MED ORDER — OMEPRAZOLE 20 MG PO CPDR: DELAYED_RELEASE_CAPSULE | ORAL | Status: DC

## 2016-01-31 MED ORDER — FLUTICASONE PROPIONATE 50 MCG/ACT NA SUSP: 2.0000 | Freq: Every day | NASAL | Status: DC

## 2016-01-31 NOTE — Telephone Encounter (Signed)
Refills requested have been all sent in.

## 2016-01-31 NOTE — Telephone Encounter (Signed)
Relation to NW:GNFApt:self Call back number:[phone]  Pharmacy: [facility] [id number] - [place], [place] - [address] AT [facility] [address] [phone] (Phone) [phone] (Fax)         Reason for call:  Patient requesting a refill omeprazole (PRILOSEC) 20 MG capsule, ranitidine (ZANTAC) 300 MG tablet, fluticasone (FLONASE) 50 MCG/ACT nasal spray and fexofenadine (ALLEGRA) 180 MG tablet

## 2016-02-01 ENCOUNTER — Other Ambulatory Visit: Payer: Self-pay | Admitting: Family Medicine

## 2016-02-07 ENCOUNTER — Encounter: Payer: Self-pay | Admitting: Family Medicine

## 2016-02-08 ENCOUNTER — Other Ambulatory Visit: Payer: Self-pay | Admitting: Family Medicine

## 2016-02-08 DIAGNOSIS — M25562 Pain in left knee: Secondary | ICD-10-CM

## 2016-02-08 DIAGNOSIS — R1031 Right lower quadrant pain: Secondary | ICD-10-CM

## 2016-02-08 DIAGNOSIS — E1169 Type 2 diabetes mellitus with other specified complication: Secondary | ICD-10-CM

## 2016-02-08 DIAGNOSIS — E782 Mixed hyperlipidemia: Secondary | ICD-10-CM

## 2016-02-08 DIAGNOSIS — G4700 Insomnia, unspecified: Secondary | ICD-10-CM

## 2016-02-08 DIAGNOSIS — F419 Anxiety disorder, unspecified: Secondary | ICD-10-CM

## 2016-02-08 DIAGNOSIS — E669 Obesity, unspecified: Secondary | ICD-10-CM

## 2016-02-08 MED ORDER — HYDROCODONE-ACETAMINOPHEN 5-325 MG PO TABS: 1.0000 | ORAL_TABLET | Freq: Four times a day (QID) | ORAL | Status: DC | PRN

## 2016-02-08 MED FILL — HYDROCODON-APAP 5-325: 5-325 | 25 days supply | Qty: 100 | Fill #0

## 2016-02-08 NOTE — Telephone Encounter (Signed)
Called the patient informed to pickup hardcopy at the front desk. 

## 2016-02-08 NOTE — Telephone Encounter (Signed)
Refill request for HYDROcodone  CB: [phone]  Pt says that she will be going out of town tomorrow, family emergency (death in family) and would like to know if she could have refill today. Pt says that she wasn't going to request but feels that she should to be on the safe side.

## 2016-02-08 NOTE — Telephone Encounter (Signed)
Requesting:  Hydrocodone Contract  3.8.2016 UDS  moderate Last OV  12/28/2015 Last Refill  #100 with 0 refills on 12/28/15  Please Advise

## 2016-03-07 ENCOUNTER — Other Ambulatory Visit: Payer: Self-pay | Admitting: Family Medicine

## 2016-03-14 ENCOUNTER — Other Ambulatory Visit: Payer: Self-pay

## 2016-03-14 MED ORDER — METFORMIN HCL 500 MG PO TABS: ORAL_TABLET | ORAL | 4 refills | Status: DC

## 2016-03-19 ENCOUNTER — Other Ambulatory Visit: Payer: Self-pay | Admitting: Family Medicine

## 2016-03-19 DIAGNOSIS — R1031 Right lower quadrant pain: Secondary | ICD-10-CM

## 2016-03-19 DIAGNOSIS — E782 Mixed hyperlipidemia: Secondary | ICD-10-CM

## 2016-03-19 DIAGNOSIS — F419 Anxiety disorder, unspecified: Secondary | ICD-10-CM

## 2016-03-19 DIAGNOSIS — E669 Obesity, unspecified: Secondary | ICD-10-CM

## 2016-03-19 DIAGNOSIS — E1169 Type 2 diabetes mellitus with other specified complication: Secondary | ICD-10-CM

## 2016-03-19 DIAGNOSIS — G4700 Insomnia, unspecified: Secondary | ICD-10-CM

## 2016-03-19 DIAGNOSIS — M25562 Pain in left knee: Secondary | ICD-10-CM

## 2016-03-20 ENCOUNTER — Telehealth: Payer: Self-pay | Admitting: Family Medicine

## 2016-03-20 MED ORDER — BAYER MICROLET LANCETS MISC: 1 refills | Status: DC

## 2016-03-20 MED ORDER — GLUCOSE BLOOD VI STRP: ORAL_STRIP | 5 refills | Status: DC

## 2016-03-20 MED ORDER — BAYER CONTOUR MONITOR DEVI: 0 refills | Status: DC

## 2016-03-20 NOTE — Telephone Encounter (Signed)
Pt also need a refill on ranitidine Rx.   Pt would like a call back for confirmation when refilled.

## 2016-03-21 ENCOUNTER — Other Ambulatory Visit: Payer: Self-pay | Admitting: Family Medicine

## 2016-03-21 DIAGNOSIS — G4700 Insomnia, unspecified: Secondary | ICD-10-CM

## 2016-03-21 DIAGNOSIS — E669 Obesity, unspecified: Secondary | ICD-10-CM

## 2016-03-21 DIAGNOSIS — M25562 Pain in left knee: Secondary | ICD-10-CM

## 2016-03-21 DIAGNOSIS — E1169 Type 2 diabetes mellitus with other specified complication: Secondary | ICD-10-CM

## 2016-03-21 DIAGNOSIS — R1031 Right lower quadrant pain: Secondary | ICD-10-CM

## 2016-03-21 DIAGNOSIS — E119 Type 2 diabetes mellitus without complications: Secondary | ICD-10-CM

## 2016-03-21 DIAGNOSIS — R002 Palpitations: Secondary | ICD-10-CM

## 2016-03-21 DIAGNOSIS — F419 Anxiety disorder, unspecified: Secondary | ICD-10-CM

## 2016-03-21 DIAGNOSIS — M549 Dorsalgia, unspecified: Secondary | ICD-10-CM

## 2016-03-21 DIAGNOSIS — E782 Mixed hyperlipidemia: Secondary | ICD-10-CM

## 2016-03-21 MED ORDER — RANITIDINE HCL 300 MG PO TABS: 300.0000 mg | ORAL_TABLET | Freq: Every day | ORAL | 6 refills | Status: DC

## 2016-03-21 MED ORDER — ALPRAZOLAM 0.5 MG PO TABS: 0.5000 mg | ORAL_TABLET | Freq: Three times a day (TID) | ORAL | 1 refills | Status: DC | PRN

## 2016-03-21 MED ORDER — CYCLOBENZAPRINE HCL 10 MG PO TABS: 10.0000 mg | ORAL_TABLET | Freq: Two times a day (BID) | ORAL | 1 refills | Status: DC | PRN

## 2016-03-21 MED ORDER — HYDROCODONE-ACETAMINOPHEN 5-325 MG PO TABS: 1.0000 | ORAL_TABLET | Freq: Four times a day (QID) | ORAL | 0 refills | Status: DC | PRN

## 2016-03-21 MED FILL — HYDROCODON-APAP 5-325: 5-325 | 25 days supply | Qty: 100 | Fill #0

## 2016-03-21 MED FILL — ALPRAZolam 0.5 MG TABS: 0.5 | 30 days supply | Qty: 90 | Fill #0

## 2016-03-21 NOTE — Addendum Note (Signed)
Addended by: Scharlene GlossEWING, Afomia Blackley B on: 03/21/2016 07:35 AM   Modules accepted: Orders

## 2016-03-21 NOTE — Telephone Encounter (Signed)
Refill done and patient informed 

## 2016-03-30 ENCOUNTER — Emergency Department (HOSPITAL_BASED_OUTPATIENT_CLINIC_OR_DEPARTMENT_OTHER): Payer: 59

## 2016-03-30 ENCOUNTER — Emergency Department (HOSPITAL_BASED_OUTPATIENT_CLINIC_OR_DEPARTMENT_OTHER)
Admission: EM | Admit: 2016-03-30 | Discharge: 2016-03-30 | Discharge status: Against Medical Advice | Payer: 59 | Attending: Dermatology | Admitting: Dermatology

## 2016-03-30 ENCOUNTER — Encounter (HOSPITAL_BASED_OUTPATIENT_CLINIC_OR_DEPARTMENT_OTHER): Payer: Self-pay | Admitting: *Deleted

## 2016-03-30 DIAGNOSIS — Z8541 Personal history of malignant neoplasm of cervix uteri: Secondary | ICD-10-CM | POA: Insufficient documentation

## 2016-03-30 DIAGNOSIS — E119 Type 2 diabetes mellitus without complications: Secondary | ICD-10-CM | POA: Insufficient documentation

## 2016-03-30 DIAGNOSIS — F17210 Nicotine dependence, cigarettes, uncomplicated: Secondary | ICD-10-CM | POA: Insufficient documentation

## 2016-03-30 DIAGNOSIS — M79671 Pain in right foot: Secondary | ICD-10-CM | POA: Diagnosis present

## 2016-03-30 DIAGNOSIS — Z7984 Long term (current) use of oral hypoglycemic drugs: Secondary | ICD-10-CM | POA: Insufficient documentation

## 2016-03-30 DIAGNOSIS — Z5321 Procedure and treatment not carried out due to patient leaving prior to being seen by health care provider: Secondary | ICD-10-CM | POA: Diagnosis not present

## 2016-03-30 NOTE — ED Notes (Signed)
Pt informed RN she was leaving and she would get results from PCP tomorrow.  Told pt if she needed to stay we would get to her as soon as we could and apologized for the wait.  Pt not angry and left

## 2016-03-31 ENCOUNTER — Telehealth: Payer: Self-pay | Admitting: Family Medicine

## 2016-03-31 NOTE — Telephone Encounter (Signed)
°  Relationship to patient: Self  Can be reached: [phone]    Reason for call: Request call back with results of XRay that was taken yesterday in the [facility]. Patient states she left before they gave her the results.

## 2016-03-31 NOTE — Telephone Encounter (Signed)
Notify negative for fracture or acute concerns

## 2016-04-01 NOTE — Telephone Encounter (Signed)
Called; informed of results.

## 2016-04-04 ENCOUNTER — Other Ambulatory Visit: Payer: Self-pay | Admitting: Family Medicine

## 2016-05-07 ENCOUNTER — Other Ambulatory Visit: Payer: Self-pay | Admitting: Family Medicine

## 2016-05-07 DIAGNOSIS — F419 Anxiety disorder, unspecified: Secondary | ICD-10-CM

## 2016-05-07 DIAGNOSIS — R1031 Right lower quadrant pain: Secondary | ICD-10-CM

## 2016-05-07 DIAGNOSIS — G4700 Insomnia, unspecified: Secondary | ICD-10-CM

## 2016-05-07 DIAGNOSIS — E1169 Type 2 diabetes mellitus with other specified complication: Secondary | ICD-10-CM

## 2016-05-07 DIAGNOSIS — M25562 Pain in left knee: Secondary | ICD-10-CM

## 2016-05-07 DIAGNOSIS — E669 Obesity, unspecified: Secondary | ICD-10-CM

## 2016-05-07 DIAGNOSIS — E782 Mixed hyperlipidemia: Secondary | ICD-10-CM

## 2016-05-08 MED ORDER — HYDROCODONE-ACETAMINOPHEN 5-325 MG PO TABS: 1.0000 | ORAL_TABLET | Freq: Four times a day (QID) | ORAL | 0 refills | Status: DC | PRN

## 2016-05-08 NOTE — Telephone Encounter (Signed)
Called the patient informed hardcopy of hydrocodone ready for pickup.

## 2016-05-13 ENCOUNTER — Other Ambulatory Visit: Payer: Self-pay | Admitting: Family Medicine

## 2016-05-13 MED ORDER — MONTELUKAST SODIUM 10 MG PO TABS: 10.0000 mg | ORAL_TABLET | Freq: Every day | ORAL | 6 refills | Status: DC

## 2016-06-23 ENCOUNTER — Other Ambulatory Visit: Payer: Self-pay | Admitting: Family Medicine

## 2016-06-23 DIAGNOSIS — M25562 Pain in left knee: Secondary | ICD-10-CM

## 2016-06-23 DIAGNOSIS — R1031 Right lower quadrant pain: Secondary | ICD-10-CM

## 2016-06-23 DIAGNOSIS — F419 Anxiety disorder, unspecified: Secondary | ICD-10-CM

## 2016-06-23 DIAGNOSIS — R002 Palpitations: Secondary | ICD-10-CM

## 2016-06-23 DIAGNOSIS — E1169 Type 2 diabetes mellitus with other specified complication: Secondary | ICD-10-CM

## 2016-06-23 DIAGNOSIS — E782 Mixed hyperlipidemia: Secondary | ICD-10-CM

## 2016-06-23 DIAGNOSIS — E669 Obesity, unspecified: Secondary | ICD-10-CM

## 2016-06-23 DIAGNOSIS — G4700 Insomnia, unspecified: Secondary | ICD-10-CM

## 2016-06-23 MED ORDER — ALPRAZOLAM 0.5 MG PO TABS: 0.5000 mg | ORAL_TABLET | Freq: Three times a day (TID) | ORAL | 0 refills | Status: DC | PRN

## 2016-06-23 MED ORDER — HYDROCODONE-ACETAMINOPHEN 5-325 MG PO TABS: 1.0000 | ORAL_TABLET | Freq: Four times a day (QID) | ORAL | 0 refills | Status: DC | PRN

## 2016-06-23 NOTE — Telephone Encounter (Signed)
Printed on prescriptions needs appt. For further refills. Called the patient left a detailed message hardcopy's of both alprazolam and hydrocodone ready for pickup and to scheduled appt.

## 2016-06-23 NOTE — Telephone Encounter (Signed)
Requesting: Alprazolam and hydrocodone Contract   10/17/2014 UDS  Moderate Last OV    12/28/2015 Last Refill   Alprazolam---#90 with 1 refill on 03/21/16                     Hydrocodone ---#100 with 0 refills on 9/28/201  Please Advise

## 2016-06-23 NOTE — Telephone Encounter (Signed)
I will approve a one month supply on both meds but she has not been seen in nearly 6 months so will need an appt for more refills

## 2016-06-23 NOTE — Telephone Encounter (Signed)
Caller name: Relationship to patient: Self Can be reached: [phone]  Pharmacy:  Reason for call: Refill HYDROcodone-acetaminophen (NORCO/VICODIN) 5-325 MG tablet [[id number]  ALPRAZolam (XANAX) 0.5 MG tablet [[id number]

## 2016-07-21 ENCOUNTER — Other Ambulatory Visit: Payer: Self-pay | Admitting: Family Medicine

## 2016-07-21 DIAGNOSIS — F419 Anxiety disorder, unspecified: Secondary | ICD-10-CM

## 2016-07-21 MED ORDER — FLUOXETINE HCL 40 MG PO CAPS: 40.0000 mg | ORAL_CAPSULE | Freq: Every day | ORAL | 5 refills | Status: DC

## 2016-07-29 ENCOUNTER — Other Ambulatory Visit: Payer: Self-pay | Admitting: Family Medicine

## 2016-07-29 DIAGNOSIS — E782 Mixed hyperlipidemia: Secondary | ICD-10-CM

## 2016-07-29 DIAGNOSIS — M25562 Pain in left knee: Secondary | ICD-10-CM

## 2016-07-29 DIAGNOSIS — E1169 Type 2 diabetes mellitus with other specified complication: Secondary | ICD-10-CM

## 2016-07-29 DIAGNOSIS — E669 Obesity, unspecified: Secondary | ICD-10-CM

## 2016-07-29 DIAGNOSIS — R002 Palpitations: Secondary | ICD-10-CM

## 2016-07-29 DIAGNOSIS — R1031 Right lower quadrant pain: Secondary | ICD-10-CM

## 2016-07-29 DIAGNOSIS — G4700 Insomnia, unspecified: Secondary | ICD-10-CM

## 2016-07-29 DIAGNOSIS — E785 Hyperlipidemia, unspecified: Secondary | ICD-10-CM

## 2016-07-29 DIAGNOSIS — F419 Anxiety disorder, unspecified: Secondary | ICD-10-CM

## 2016-07-29 DIAGNOSIS — I10 Essential (primary) hypertension: Secondary | ICD-10-CM

## 2016-07-29 MED ORDER — FEXOFENADINE HCL 180 MG PO TABS: 180.0000 mg | ORAL_TABLET | Freq: Every day | ORAL | 6 refills | Status: DC | PRN

## 2016-07-29 MED ORDER — FLUOXETINE HCL 40 MG PO CAPS: 40.0000 mg | ORAL_CAPSULE | Freq: Every day | ORAL | 1 refills | Status: DC

## 2016-07-29 MED ORDER — HYDROCODONE-ACETAMINOPHEN 5-325 MG PO TABS: 1.0000 | ORAL_TABLET | Freq: Four times a day (QID) | ORAL | 0 refills | Status: DC | PRN

## 2016-07-29 MED ORDER — ATORVASTATIN CALCIUM 10 MG PO TABS: 10.0000 mg | ORAL_TABLET | Freq: Every day | ORAL | 0 refills | Status: DC

## 2016-07-29 MED ORDER — MONTELUKAST SODIUM 10 MG PO TABS: 10.0000 mg | ORAL_TABLET | Freq: Every day | ORAL | 6 refills | Status: DC

## 2016-07-29 MED ORDER — METFORMIN HCL 500 MG PO TABS: ORAL_TABLET | ORAL | 4 refills | Status: DC

## 2016-07-29 MED ORDER — AMITRIPTYLINE HCL 10 MG PO TABS: 10.0000 mg | ORAL_TABLET | Freq: Every evening | ORAL | 2 refills | Status: DC | PRN

## 2016-07-29 MED ORDER — METOPROLOL SUCCINATE ER 50 MG PO TB24: ORAL_TABLET | ORAL | 1 refills | Status: DC

## 2016-07-29 MED ORDER — OMEPRAZOLE 20 MG PO CPDR: DELAYED_RELEASE_CAPSULE | ORAL | 2 refills | Status: DC

## 2016-07-29 MED ORDER — ALPRAZOLAM 0.5 MG PO TABS: 0.5000 mg | ORAL_TABLET | Freq: Three times a day (TID) | ORAL | 1 refills | Status: DC | PRN

## 2016-07-29 MED ORDER — SUMATRIPTAN SUCCINATE 50 MG PO TABS: ORAL_TABLET | ORAL | 0 refills | Status: DC

## 2016-07-29 MED ORDER — FLUTICASONE PROPIONATE 50 MCG/ACT NA SUSP: 2.0000 | Freq: Every day | NASAL | 6 refills | Status: DC

## 2016-07-29 MED ORDER — PROMETHAZINE HCL 25 MG PO TABS: 25.0000 mg | ORAL_TABLET | Freq: Three times a day (TID) | ORAL | 2 refills | Status: DC | PRN

## 2016-07-29 NOTE — Telephone Encounter (Signed)
Refilled alprazolam and hydrocodone per PCP instruction. Advise on the elavil as well.

## 2016-08-15 ENCOUNTER — Encounter: Payer: Self-pay | Admitting: Family Medicine

## 2016-08-15 ENCOUNTER — Ambulatory Visit (INDEPENDENT_AMBULATORY_CARE_PROVIDER_SITE_OTHER): Payer: 59 | Admitting: Family Medicine

## 2016-08-15 VITALS — BP 118/82 | HR 79 | Temp 97.5°F | Resp 16 | Ht 63.0 in | Wt 210.2 lb

## 2016-08-15 DIAGNOSIS — T7840XD Allergy, unspecified, subsequent encounter: Secondary | ICD-10-CM

## 2016-08-15 DIAGNOSIS — E669 Obesity, unspecified: Secondary | ICD-10-CM | POA: Diagnosis not present

## 2016-08-15 DIAGNOSIS — H04123 Dry eye syndrome of bilateral lacrimal glands: Secondary | ICD-10-CM

## 2016-08-15 DIAGNOSIS — E782 Mixed hyperlipidemia: Secondary | ICD-10-CM | POA: Diagnosis not present

## 2016-08-15 DIAGNOSIS — Z23 Encounter for immunization: Secondary | ICD-10-CM

## 2016-08-15 DIAGNOSIS — N952 Postmenopausal atrophic vaginitis: Secondary | ICD-10-CM

## 2016-08-15 DIAGNOSIS — F418 Other specified anxiety disorders: Secondary | ICD-10-CM

## 2016-08-15 DIAGNOSIS — F329 Major depressive disorder, single episode, unspecified: Secondary | ICD-10-CM

## 2016-08-15 DIAGNOSIS — I10 Essential (primary) hypertension: Secondary | ICD-10-CM

## 2016-08-15 DIAGNOSIS — Z0000 Encounter for general adult medical examination without abnormal findings: Secondary | ICD-10-CM | POA: Diagnosis not present

## 2016-08-15 DIAGNOSIS — E1169 Type 2 diabetes mellitus with other specified complication: Secondary | ICD-10-CM | POA: Diagnosis not present

## 2016-08-15 DIAGNOSIS — E663 Overweight: Secondary | ICD-10-CM

## 2016-08-15 DIAGNOSIS — F32A Depression, unspecified: Secondary | ICD-10-CM

## 2016-08-15 DIAGNOSIS — R3915 Urgency of urination: Secondary | ICD-10-CM

## 2016-08-15 DIAGNOSIS — F419 Anxiety disorder, unspecified: Secondary | ICD-10-CM

## 2016-08-15 LAB — LIPID PANEL
Cholesterol: 132 mg/dL (ref 0–200)
HDL: 63.1 mg/dL (ref >39.00)
LDL CALC: 52 mg/dL (ref 0–99)
NONHDL: 68.55
Total CHOL/HDL Ratio: 2
Triglycerides: 84 mg/dL (ref 0.0–149.0)
VLDL: 16.8 mg/dL (ref 0.0–40.0)

## 2016-08-15 LAB — COMPREHENSIVE METABOLIC PANEL
ALBUMIN: 4.2 g/dL (ref 3.5–5.2)
ALT: 18 U/L (ref 0–35)
AST: 16 U/L (ref 0–37)
Alkaline Phosphatase: 159 U/L — ABNORMAL HIGH (ref 39–117)
BUN: 13 mg/dL (ref 6–23)
CALCIUM: 9.8 mg/dL (ref 8.4–10.5)
CO2: 24 mEq/L (ref 19–32)
Chloride: 106 mEq/L (ref 96–112)
Creatinine, Ser: 0.71 mg/dL (ref 0.40–1.20)
GFR: 115.98 mL/min (ref >60.00)
Glucose, Bld: 123 mg/dL — ABNORMAL HIGH (ref 70–99)
POTASSIUM: 3.8 meq/L (ref 3.5–5.1)
SODIUM: 140 meq/L (ref 135–145)
TOTAL PROTEIN: 7.6 g/dL (ref 6.0–8.3)
Total Bilirubin: 0.2 mg/dL (ref 0.2–1.2)

## 2016-08-15 LAB — HEMOGLOBIN A1C: Hgb A1c MFr Bld: 8 % — ABNORMAL HIGH (ref 4.6–6.5)

## 2016-08-15 LAB — CBC
HEMATOCRIT: 40.2 % (ref 36.0–46.0)
HEMOGLOBIN: 13.2 g/dL (ref 12.0–15.0)
MCHC: 32.8 g/dL (ref 30.0–36.0)
MCV: 87.1 fl (ref 78.0–100.0)
PLATELETS: 169 10*3/uL (ref 150.0–400.0)
RBC: 4.62 Mil/uL (ref 3.87–5.11)
RDW: 15.8 % — ABNORMAL HIGH (ref 11.5–15.5)
WBC: 5.4 10*3/uL (ref 4.0–10.5)

## 2016-08-15 LAB — HIV ANTIBODY (ROUTINE TESTING W REFLEX): HIV 1&2 Ab, 4th Generation: NONREACTIVE

## 2016-08-15 LAB — TSH: TSH: 1.07 u[IU]/mL (ref 0.35–4.50)

## 2016-08-15 NOTE — Progress Notes (Signed)
Subjective:    Patient ID: Nicole Ball, female    DOB: August 15, 1975, 41 y.o.   MRN: [id number]  Chief Complaint  Patient presents with  . Anxiety    follow up    HPI Patient is in today for a follow up patient c/o a weak bladder. She denies any urinary frequency, dysuria or hematuria but notes when she has an urge now she has less time to make it to the bathroom. No abdominal pain but does still note some intermittent low back pain. No radicular symptoms or falls. She is settling into her new job and thus has less anxiety. Her husband's health is imrpvoing. Denies CP/palp/SOB/HA/congestion/fevers/GI or GU c/o. Taking meds as prescribed  Past Medical History:  Diagnosis Date  . Abnormal serum level of alkaline phosphatase 05/21/2014  . Acute low back pain 12/19/2010  . Acute sinusitis 08/06/2012  . ALLERGIC RHINITIS 10/16/2010  . Allergy    seasonal  . Amenorrhea 07/11/2011  . Anxiety and depression 06/07/2011  . Atrophic vaginitis 08/17/2016  . Cervical cancer screening 01/10/2014   Menarche at 8 Regular and moderate flow, became irregular until OCPs  history of abnormal pap in past, repeat was normal Last pap roughly 3 years ago G0P0, s/p No history of abnormal MGM, no h/o MGM  No concerns today no gyn surgeries LMP May 2013  . CTS (carpal tunnel syndrome) 01/15/2014  . Diabetes mellitus 10/30/2011  . Diabetes mellitus type 2 in obese (HCC) 10/30/2011  . Dry eyes 08/17/2016  . Elevated BP 04/21/2011  . HIATAL HERNIA WITH REFLUX, HX OF 10/16/2010  . Hyperlipidemia 04/21/2011  . INSOMNIA 10/16/2010  . Knee pain, left 03/11/2012  . Migraine 03/07/2013  . Overweight(278.02) 10/16/2010  . Palpitations 01/28/2015  . Preventative health care 01/15/2014  . Reflux 04/22/2012  . Sleep apnea 06/07/2011  . SNORING 10/16/2010  . Urinary urgency 08/17/2016  . UTI'S, HX OF 10/16/2010    Past Surgical History:  Procedure Laterality Date  . CHOLECYSTECTOMY      Family History  Problem Relation Age of Onset    . Lupus Mother   . Arthritis Maternal Grandmother   . Scleroderma Maternal Grandmother   . Glaucoma Maternal Grandmother   . Cataracts Maternal Grandmother   . Cancer Maternal Grandfather     prostate  . Coronary artery disease Maternal Grandfather     s/p angioplasty  . Diabetes Maternal Grandfather     Type 2  . Cancer Paternal Grandmother     Breast ca- dx in 3350's  . Thyroid disease Paternal Grandmother   . Diabetes Paternal Grandfather   . Thyroid disease Sister   . Asthma Sister   . Allergies Brother   . Arthritis Other     Social History   Social History  . Marital status: Married    Spouse name: N/A  . Number of children: N/A  . Years of education: N/A   Occupational History  . Not on file.   Social History Main Topics  . Smoking status: Current Some Day Smoker    Packs/day: 0.10    Types: Cigars  . Smokeless tobacco: Never Used     Comment: 1 black and milds  . Alcohol use Yes     Comment: twice/year  . Drug use: No  . Sexual activity: Not on file   Other Topics Concern  . Not on file   Social History Narrative   Married lives w/ husband, works at [employer]  Outpatient Medications Prior to Visit  Medication Sig Dispense Refill  . ACETAMINOPHEN-BUTALBITAL 50-325 MG TABS Take 1 tablet by mouth 2 (two) times daily as needed (HEADACHES). 120 each 0  . ALPRAZolam (XANAX) 0.5 MG tablet Take 1 tablet (0.5 mg total) by mouth 3 (three) times daily as needed for anxiety. 90 tablet 1  . amitriptyline (ELAVIL) 10 MG tablet Take 1-2 tablets (10-20 mg total) by mouth at bedtime as needed for sleep. 60 tablet 2  . atorvastatin (LIPITOR) 10 MG tablet Take 1 tablet (10 mg total) by mouth daily. 90 tablet 0  . BAYER MICROLET LANCETS lancets Use as directed once daily to check blood sugar.   DX E11.9 100 each 1  . Blood Glucose Monitoring Suppl (CONTOUR BLOOD GLUCOSE SYSTEM) DEVI Use daily to check blood sugar. DX E11.9 1 Device 0  . conjugated estrogens  (PREMARIN) vaginal cream Place 1 Applicatorful vaginally daily. 42.5 g 12  . cyclobenzaprine (FLEXERIL) 10 MG tablet Take 1 tablet (10 mg total) by mouth 2 (two) times daily as needed for muscle spasms. 60 tablet 1  . fexofenadine (ALLEGRA) 180 MG tablet Take 1 tablet (180 mg total) by mouth daily as needed. 30 tablet 6  . fluconazole (DIFLUCAN) 150 MG tablet Take 1 tablet (150 mg total) by mouth once a week. 2 tablet 02  . FLUoxetine (PROZAC) 40 MG capsule Take 1 capsule (40 mg total) by mouth daily. 30 capsule 1  . fluticasone (FLONASE) 50 MCG/ACT nasal spray Place 2 sprays into both nostrils daily. 16 g 6  . glucose blood (BAYER CONTOUR TEST) test strip Use as directed once daily to check blood sugar.  DX E11.9 100 each 5  . HYDROcodone-acetaminophen (NORCO/VICODIN) 5-325 MG tablet Take 1 tablet by mouth every 6 (six) hours as needed. Patient needs appointment for further refills. 100 tablet 0  . ibuprofen (ADVIL,MOTRIN) 800 MG tablet Take 1 tablet (800 mg total) by mouth 3 (three) times daily. 21 tablet 0  . meloxicam (MOBIC) 15 MG tablet TAKE 1 TABLET BY MOUTH EVERY DAY 90 tablet 0  . metFORMIN (GLUCOPHAGE) 500 MG tablet TAKE 1 TABLET(500 MG) BY MOUTH THREE TIMES DAILY 90 tablet 4  . metoprolol succinate (TOPROL-XL) 50 MG 24 hr tablet TAKE 1 TABLET BY MOUTH DAILY TAKE WITH OR IMMEDIATELY FOLLOWING A MEAL 30 tablet 1  . montelukast (SINGULAIR) 10 MG tablet Take 1 tablet (10 mg total) by mouth at bedtime. 30 tablet 6  . omeprazole (PRILOSEC) 20 MG capsule TAKE ONE CAPSULE BY MOUTH TWICE DAILY AS NEEDED FOR REFLUX 60 capsule 2  . promethazine (PHENERGAN) 25 MG tablet Take 1 tablet (25 mg total) by mouth every 8 (eight) hours as needed for nausea or vomiting. 20 tablet 2  . ranitidine (ZANTAC) 300 MG tablet Take 1 tablet (300 mg total) by mouth at bedtime. 30 tablet 6  . Saline 0.65 % (SOLN) SOLN Place 2 Squirts into the nose 2 (two) times daily. 1 Bottle 0  . SUMAtriptan (IMITREX) 50 MG tablet  TAKE 1 TABLET BY MOUTH EVERY 2 HOURS AS NEEDED FOR MIGRAINE - NO MORE THAN 2 PER 24 HOURS 10 tablet 0   No facility-administered medications prior to visit.     No Known Allergies  Review of Systems  Unable to perform ROS: Severe respiratory distress  Constitutional: Positive for malaise/fatigue. Negative for fever.  HENT: Negative for congestion.   Eyes: Negative for blurred vision.  Respiratory: Negative for cough.   Cardiovascular: Negative for chest pain  and palpitations.  Gastrointestinal: Negative for vomiting.  Genitourinary: Positive for frequency.  Musculoskeletal: Positive for back pain.  Skin: Negative for rash.  Neurological: Negative for loss of consciousness and headaches.  Psychiatric/Behavioral: Negative for depression, substance abuse and suicidal ideas. The patient is nervous/anxious.   All other systems reviewed and are negative.      Objective:    Physical Exam  Constitutional: She is oriented to person, place, and time. She appears well-developed and well-nourished. No distress.  HENT:  Head: Normocephalic and atraumatic.  Eyes: Conjunctivae are normal. Pupils are equal, round, and reactive to light.  Neck: Normal range of motion. No thyromegaly present.  Cardiovascular: Normal rate and regular rhythm.   Pulmonary/Chest: Effort normal and breath sounds normal. She has no wheezes.  Abdominal: Soft. Bowel sounds are normal. She exhibits no mass. There is no tenderness. There is no guarding.  Musculoskeletal: Normal range of motion. She exhibits no edema or deformity.  Neurological: She is alert and oriented to person, place, and time.  Skin: Skin is warm and dry. She is not diaphoretic.  Psychiatric: She has a normal mood and affect.    BP 118/82 (BP Location: Right Arm, Patient Position: Sitting, Cuff Size: Large)   Pulse 79   Temp 97.5 F (36.4 C)   Resp 16   Ht 5\' 3"  (1.6 m)   Wt 210 lb 3.2 oz (95.3 kg)   LMP 12/10/2012   SpO2 99%   BMI 37.24  kg/m  Wt Readings from Last 3 Encounters:  08/15/16 210 lb 3.2 oz (95.3 kg)  03/30/16 205 lb (93 kg)  12/28/15 207 lb 2 oz (94 kg)     Lab Results  Component Value Date   WBC 5.4 08/15/2016   HGB 13.2 08/15/2016   HCT 40.2 08/15/2016   PLT 169.0 08/15/2016   GLUCOSE 123 (H) 08/15/2016   CHOL 132 08/15/2016   TRIG 84.0 08/15/2016   HDL 63.10 08/15/2016   LDLCALC 52 08/15/2016   ALT 18 08/15/2016   AST 16 08/15/2016   NA 140 08/15/2016   K 3.8 08/15/2016   CL 106 08/15/2016   CREATININE 0.71 08/15/2016   BUN 13 08/15/2016   CO2 24 08/15/2016   TSH 1.07 08/15/2016   HGBA1C 8.0 (H) 08/15/2016   MICROALBUR <0.7 09/17/2015    Lab Results  Component Value Date   TSH 1.07 08/15/2016   Lab Results  Component Value Date   WBC 5.4 08/15/2016   HGB 13.2 08/15/2016   HCT 40.2 08/15/2016   MCV 87.1 08/15/2016   PLT 169.0 08/15/2016   Lab Results  Component Value Date   NA 140 08/15/2016   K 3.8 08/15/2016   CO2 24 08/15/2016   GLUCOSE 123 (H) 08/15/2016   BUN 13 08/15/2016   CREATININE 0.71 08/15/2016   BILITOT 0.2 08/15/2016   ALKPHOS 159 (H) 08/15/2016   AST 16 08/15/2016   ALT 18 08/15/2016   PROT 7.6 08/15/2016   ALBUMIN 4.2 08/15/2016   CALCIUM 9.8 08/15/2016   GFR 115.98 08/15/2016   Lab Results  Component Value Date   CHOL 132 08/15/2016   Lab Results  Component Value Date   HDL 63.10 08/15/2016   Lab Results  Component Value Date   LDLCALC 52 08/15/2016   Lab Results  Component Value Date   TRIG 84.0 08/15/2016   Lab Results  Component Value Date   CHOLHDL 2 08/15/2016   Lab Results  Component Value Date   HGBA1C 8.0 (  H) 08/15/2016       Assessment & Plan:   Problem List Items Addressed This Visit    Overweight    Encouraged DASH diet, decrease po intake and increase exercise as tolerated. Needs 7-8 hours of sleep nightly. Avoid trans fats, eat small, frequent meals every 4-5 hours with lean proteins, complex carbs and healthy  fats. Minimize simple carbs, GMO foods.      Allergic state    Still has consistent PND, cough despite some improvement with Singulair and other meds. Referred to ENT for further consideration      Relevant Orders   Ambulatory referral to ENT   Hyperlipidemia    Encouraged heart healthy diet, increase exercise, avoid trans fats, consider a krill oil cap daily      Relevant Orders   Lipid panel (Completed)   Anxiety and depression    Doing well in new job now that she has settled in, is allowed rf of Alprazolam to use prn      Diabetes mellitus type 2 in obese (HCC)    hgba1c acceptable, minimize simple carbs. Increase exercise as tolerated. Continue current meds      Relevant Orders   HgB A1c (Completed)   Preventative health care - Primary   Relevant Orders   HIV antibody (with reflex) (Completed)   Flu Vaccine QUAD 36+ mos PF IM (Fluarix & Fluzone Quad PF) (Completed)   HTN (hypertension)    Well controlled, no changes to meds. Encouraged heart healthy diet such as the DASH diet and exercise as tolerated.       Relevant Orders   Comprehensive metabolic panel (Completed)   CBC (Completed)   TSH (Completed)   Dry eyes    New, encouraged increased hydration and try OTC hydrating drops. If persists should return to her opthamologist for evalaution      Atrophic vaginitis    Premarin cream twice weekly as directed      Urinary urgency    Suspect pelvic floor dysfunction, encouraged kegel exercises twice daily consider meds or referral if no improvement         I am having Ms. Leap maintain her Saline, ibuprofen, fluconazole, ACETAMINOPHEN-BUTALBITAL, conjugated estrogens, CONTOUR BLOOD GLUCOSE SYSTEM, glucose blood, BAYER MICROLET LANCETS, cyclobenzaprine, ranitidine, meloxicam, ALPRAZolam, HYDROcodone-acetaminophen, amitriptyline, atorvastatin, FLUoxetine, fluticasone, metFORMIN, metoprolol succinate, omeprazole, promethazine, SUMAtriptan, montelukast, and  fexofenadine.  No orders of the defined types were placed in this encounter.  CMA served as Neurosurgeon during this visit. History, Physical and Plan performed by medical provider. Documentation and orders reviewed and attested to.  Danise Edge, MD

## 2016-08-15 NOTE — Assessment & Plan Note (Signed)
Notes some recent urinary urgency lately encouraged kegel's bid. If no improvement. May need referral

## 2016-08-15 NOTE — Assessment & Plan Note (Signed)
Well controlled, no changes to meds. Encouraged heart healthy diet such as the DASH diet and exercise as tolerated.  °

## 2016-08-15 NOTE — Progress Notes (Signed)
Pre visit review using our clinic review tool, if applicable. No additional management support is needed unless otherwise documented below in the visit note. 

## 2016-08-15 NOTE — Assessment & Plan Note (Signed)
Encouraged heart healthy diet, increase exercise, avoid trans fats, consider a krill oil cap daily 

## 2016-08-15 NOTE — Assessment & Plan Note (Signed)
Still has consistent PND, cough despite some improvement with Singulair and other meds. Referred to ENT for further consideration

## 2016-08-15 NOTE — Assessment & Plan Note (Signed)
Uses cpap nightly. Encouraged to clean equipment regularly

## 2016-08-15 NOTE — Assessment & Plan Note (Signed)
Encouraged DASH diet, decrease po intake and increase exercise as tolerated. Needs 7-8 hours of sleep nightly. Avoid trans fats, eat small, frequent meals every 4-5 hours with lean proteins, complex carbs and healthy fats. Minimize simple carbs, GMO foods. 

## 2016-08-15 NOTE — Patient Instructions (Signed)
Carbohydrate Counting for Diabetes Mellitus, Adult Carbohydrate counting is a method for keeping track of how many carbohydrates you eat. Eating carbohydrates naturally increases the amount of sugar (glucose) in the blood. Counting how many carbohydrates you eat helps keep your blood glucose within normal limits, which helps you manage your diabetes (diabetes mellitus). It is important to know how many carbohydrates you can safely have in each meal. This is different for every person. A diet and nutrition specialist (registered dietitian) can help you make a meal plan and calculate how many carbohydrates you should have at each meal and snack. Carbohydrates are found in the following foods:  Grains, such as breads and cereals.  Dried beans and soy products.  Starchy vegetables, such as potatoes, peas, and corn.  Fruit and fruit juices.  Milk and yogurt.  Sweets and snack foods, such as cake, cookies, candy, chips, and soft drinks. How do I count carbohydrates? There are two ways to count carbohydrates in food. You can use either of the methods or a combination of both. Reading "Nutrition Facts" on packaged food  The "Nutrition Facts" list is included on the labels of almost all packaged foods and beverages in the [place] It includes:  The serving size.  Information about nutrients in each serving, including the grams (g) of carbohydrate per serving. To use the "Nutrition Facts":  Decide how many servings you will have.  Multiply the number of servings by the number of carbohydrates per serving.  The resulting number is the total amount of carbohydrates that you will be having. Learning standard serving sizes of other foods  When you eat foods containing carbohydrates that are not packaged or do not include "Nutrition Facts" on the label, you need to measure the servings in order to count the amount of carbohydrates:  Measure the foods that you will eat with a food scale or measuring  cup, if needed.  Decide how many standard-size servings you will eat.  Multiply the number of servings by 15. Most carbohydrate-rich foods have about 15 g of carbohydrates per serving.  For example, if you eat 8 oz (170 g) of strawberries, you will have eaten 2 servings and 30 g of carbohydrates (2 servings x 15 g = 30 g).  For foods that have more than one food mixed, such as soups and casseroles, you must count the carbohydrates in each food that is included. The following list contains standard serving sizes of common carbohydrate-rich foods. Each of these servings has about 15 g of carbohydrates:   hamburger bun or  English muffin.   oz (15 mL) syrup.   oz (14 g) jelly.  1 slice of bread.  1 six-inch tortilla.  3 oz (85 g) cooked rice or pasta.  4 oz (113 g) cooked dried beans.  4 oz (113 g) starchy vegetable, such as peas, corn, or potatoes.  4 oz (113 g) hot cereal.  4 oz (113 g) mashed potatoes or  of a large baked potato.  4 oz (113 g) canned or frozen fruit.  4 oz (120 mL) fruit juice.  4-6 crackers.  6 chicken nuggets.  6 oz (170 g) unsweetened dry cereal.  6 oz (170 g) plain fat-free yogurt or yogurt sweetened with artificial sweeteners.  8 oz (240 mL) milk.  8 oz (170 g) fresh fruit or one small piece of fruit.  24 oz (680 g) popped popcorn. Example of carbohydrate counting Sample meal  3 oz (85 g) chicken breast.  6 oz (  170 g) brown rice.  4 oz (113 g) corn.  8 oz (240 mL) milk.  8 oz (170 g) strawberries with sugar-free whipped topping. Carbohydrate calculation 1. Identify the foods that contain carbohydrates:  Rice.  Corn.  Milk.  Strawberries. 2. Calculate how many servings you have of each food:  2 servings rice.  1 serving corn.  1 serving milk.  1 serving strawberries. 3. Multiply each number of servings by 15 g:  2 servings rice x 15 g = 30 g.  1 serving corn x 15 g = 15 g.  1 serving milk x 15 g = 15  g.  1 serving strawberries x 15 g = 15 g. 4. Add together all of the amounts to find the total grams of carbohydrates eaten:  30 g + 15 g + 15 g + 15 g = 75 g of carbohydrates total. This information is not intended to replace advice given to you by your health care provider. Make sure you discuss any questions you have with your health care provider. Document Released: 07/28/2005 Document Revised: 02/15/2016 Document Reviewed: 01/09/2016 Elsevier Interactive Patient Education  2017 [employer]  

## 2016-08-15 NOTE — Assessment & Plan Note (Signed)
hgba1c acceptable, minimize simple carbs. Increase exercise as tolerated. Continue current meds 

## 2016-08-17 ENCOUNTER — Encounter: Payer: Self-pay | Admitting: Family Medicine

## 2016-08-17 DIAGNOSIS — H04123 Dry eye syndrome of bilateral lacrimal glands: Secondary | ICD-10-CM

## 2016-08-17 DIAGNOSIS — N952 Postmenopausal atrophic vaginitis: Secondary | ICD-10-CM

## 2016-08-17 DIAGNOSIS — R3915 Urgency of urination: Secondary | ICD-10-CM | POA: Insufficient documentation

## 2016-08-17 HISTORY — DX: Urgency of urination: R39.15

## 2016-08-17 HISTORY — DX: Postmenopausal atrophic vaginitis: N95.2

## 2016-08-17 HISTORY — DX: Dry eye syndrome of bilateral lacrimal glands: H04.123

## 2016-08-17 NOTE — Assessment & Plan Note (Signed)
New, encouraged increased hydration and try OTC hydrating drops. If persists should return to her opthamologist for evalaution

## 2016-08-17 NOTE — Assessment & Plan Note (Signed)
Doing well in new job now that she has settled in, is allowed rf of Alprazolam to use prn

## 2016-08-17 NOTE — Assessment & Plan Note (Signed)
Suspect pelvic floor dysfunction, encouraged kegel exercises twice daily consider meds or referral if no improvement

## 2016-08-17 NOTE — Assessment & Plan Note (Signed)
Premarin cream twice weekly as directed

## 2016-08-18 ENCOUNTER — Encounter: Payer: Self-pay | Admitting: Family Medicine

## 2016-08-18 ENCOUNTER — Other Ambulatory Visit: Payer: Self-pay

## 2016-08-18 DIAGNOSIS — Z79899 Other long term (current) drug therapy: Secondary | ICD-10-CM | POA: Diagnosis not present

## 2016-08-18 DIAGNOSIS — Z79891 Long term (current) use of opiate analgesic: Secondary | ICD-10-CM | POA: Diagnosis not present

## 2016-08-18 MED ORDER — METFORMIN HCL 500 MG PO TABS: ORAL_TABLET | ORAL | 3 refills | Status: DC

## 2016-08-18 NOTE — Telephone Encounter (Signed)
If she was not taking the metformin tid it is OK to start doing that and recheck labs at 3 month mark

## 2016-08-18 NOTE — Telephone Encounter (Signed)
Per the patient she was never taking the medication correctly to begin with. Patient states that she wants to go back to taking 1 tablet tid instead of 2 tablets bid.  Please advise  PC

## 2016-08-28 DIAGNOSIS — J069 Acute upper respiratory infection, unspecified: Secondary | ICD-10-CM | POA: Diagnosis not present

## 2016-08-29 ENCOUNTER — Telehealth: Payer: Self-pay | Admitting: Family Medicine

## 2016-08-29 DIAGNOSIS — F419 Anxiety disorder, unspecified: Secondary | ICD-10-CM

## 2016-08-29 DIAGNOSIS — G4700 Insomnia, unspecified: Secondary | ICD-10-CM

## 2016-08-29 DIAGNOSIS — E669 Obesity, unspecified: Secondary | ICD-10-CM

## 2016-08-29 DIAGNOSIS — E782 Mixed hyperlipidemia: Secondary | ICD-10-CM

## 2016-08-29 DIAGNOSIS — R1031 Right lower quadrant pain: Secondary | ICD-10-CM

## 2016-08-29 DIAGNOSIS — M25562 Pain in left knee: Secondary | ICD-10-CM

## 2016-08-29 DIAGNOSIS — E1169 Type 2 diabetes mellitus with other specified complication: Secondary | ICD-10-CM

## 2016-08-29 MED ORDER — HYDROCODONE-ACETAMINOPHEN 5-325 MG PO TABS: 1.0000 | ORAL_TABLET | Freq: Four times a day (QID) | ORAL | 0 refills | Status: DC | PRN

## 2016-08-29 NOTE — Telephone Encounter (Signed)
Requesting:  Hydrocodone Contract   10/17/2014 UDS   Low risk--next is due 02/12/2017 Last OV   08/15/2016 Last Refill   #100 with 0 refills on 07/29/2016  Please Advise

## 2016-08-29 NOTE — Telephone Encounter (Signed)
Printed hardcopy/provider signed. UDS is up to date/ Hardcopy given to her husband who was in the office today for appt.

## 2016-08-29 NOTE — Telephone Encounter (Signed)
Patient is a requesting a refill of  HYDROcodone-acetaminophen (NORCO/VICODIN) 5-325 MG tablet, Patient's husband has an appt today and she would like to know if this can be ready for him to pick up when he comes in at 3:45pm Please advise  Phone: [phone]

## 2016-08-29 NOTE — Telephone Encounter (Signed)
Chart reviewed. OK to refill. Does not appear she regularly uses Xanax at this time. Let's make sure she has an UTD controlled substance agreement with our office.

## 2016-09-19 DIAGNOSIS — G4489 Other headache syndrome: Secondary | ICD-10-CM | POA: Diagnosis not present

## 2016-09-19 DIAGNOSIS — J309 Allergic rhinitis, unspecified: Secondary | ICD-10-CM | POA: Diagnosis not present

## 2016-09-19 DIAGNOSIS — G43909 Migraine, unspecified, not intractable, without status migrainosus: Secondary | ICD-10-CM | POA: Diagnosis not present

## 2016-09-19 DIAGNOSIS — K219 Gastro-esophageal reflux disease without esophagitis: Secondary | ICD-10-CM | POA: Diagnosis not present

## 2016-10-03 ENCOUNTER — Other Ambulatory Visit: Payer: Self-pay | Admitting: Family Medicine

## 2016-10-14 ENCOUNTER — Telehealth: Payer: Self-pay | Admitting: Family Medicine

## 2016-10-14 DIAGNOSIS — E1169 Type 2 diabetes mellitus with other specified complication: Secondary | ICD-10-CM

## 2016-10-14 DIAGNOSIS — F419 Anxiety disorder, unspecified: Secondary | ICD-10-CM

## 2016-10-14 DIAGNOSIS — E669 Obesity, unspecified: Secondary | ICD-10-CM

## 2016-10-14 DIAGNOSIS — R1031 Right lower quadrant pain: Secondary | ICD-10-CM

## 2016-10-14 DIAGNOSIS — G4700 Insomnia, unspecified: Secondary | ICD-10-CM

## 2016-10-14 DIAGNOSIS — E782 Mixed hyperlipidemia: Secondary | ICD-10-CM

## 2016-10-14 DIAGNOSIS — M25562 Pain in left knee: Secondary | ICD-10-CM

## 2016-10-14 NOTE — Telephone Encounter (Signed)
Caller name: Relationship to patient:Self Can be reached: [phone] Pharmacy:  Reason for call: Refill HYDROcodone-acetaminophen (NORCO/VICODIN) 5-325 MG tablet [[id number] Will pick up Thursday 3/8

## 2016-10-14 NOTE — Telephone Encounter (Signed)
Requesting:   Hydrocodone Contract    Signed on 08/25/2016 UDS   Done on 08/15/2016 was low risk--next is due on 02/22/2017 Last OV    08/15/2016----next scheduled appointment is 02/10/2017 Last Refill     #100 with 0 refills on 08/29/2016  Please Advise

## 2016-10-14 NOTE — Telephone Encounter (Signed)
Ok to refill requested med 

## 2016-10-16 ENCOUNTER — Other Ambulatory Visit: Payer: Self-pay | Admitting: Family Medicine

## 2016-10-16 MED ORDER — HYDROCODONE-ACETAMINOPHEN 5-325 MG PO TABS: 1.0000 | ORAL_TABLET | Freq: Four times a day (QID) | ORAL | 0 refills | Status: DC | PRN

## 2016-10-16 NOTE — Addendum Note (Signed)
Addended by: Scharlene GlossEWING, Thereasa Iannello B on: 10/16/2016 07:48 AM   Modules accepted: Orders

## 2016-10-16 NOTE — Telephone Encounter (Signed)
Printed and on counter for signature. Called the patient informed to pickup at the front desk. 

## 2016-10-23 ENCOUNTER — Other Ambulatory Visit: Payer: Self-pay | Admitting: Family Medicine

## 2016-11-05 ENCOUNTER — Other Ambulatory Visit: Payer: Self-pay | Admitting: Family Medicine

## 2016-11-17 ENCOUNTER — Telehealth: Payer: Self-pay | Admitting: Family Medicine

## 2016-11-17 ENCOUNTER — Other Ambulatory Visit (INDEPENDENT_AMBULATORY_CARE_PROVIDER_SITE_OTHER): Payer: 59

## 2016-11-17 ENCOUNTER — Encounter: Payer: Self-pay | Admitting: Family Medicine

## 2016-11-17 ENCOUNTER — Other Ambulatory Visit: Payer: Self-pay | Admitting: Emergency Medicine

## 2016-11-17 DIAGNOSIS — G4700 Insomnia, unspecified: Secondary | ICD-10-CM

## 2016-11-17 DIAGNOSIS — E669 Obesity, unspecified: Secondary | ICD-10-CM | POA: Diagnosis not present

## 2016-11-17 DIAGNOSIS — E782 Mixed hyperlipidemia: Secondary | ICD-10-CM

## 2016-11-17 DIAGNOSIS — E1169 Type 2 diabetes mellitus with other specified complication: Secondary | ICD-10-CM

## 2016-11-17 DIAGNOSIS — R002 Palpitations: Secondary | ICD-10-CM

## 2016-11-17 DIAGNOSIS — F419 Anxiety disorder, unspecified: Secondary | ICD-10-CM

## 2016-11-17 DIAGNOSIS — R1031 Right lower quadrant pain: Secondary | ICD-10-CM

## 2016-11-17 DIAGNOSIS — M25562 Pain in left knee: Secondary | ICD-10-CM

## 2016-11-17 LAB — COMPREHENSIVE METABOLIC PANEL
ALBUMIN: 4.2 g/dL (ref 3.5–5.2)
ALT: 18 U/L (ref 0–35)
AST: 15 U/L (ref 0–37)
Alkaline Phosphatase: 142 U/L — ABNORMAL HIGH (ref 39–117)
BUN: 12 mg/dL (ref 6–23)
CHLORIDE: 104 meq/L (ref 96–112)
CO2: 22 meq/L (ref 19–32)
CREATININE: 0.72 mg/dL (ref 0.40–1.20)
Calcium: 10 mg/dL (ref 8.4–10.5)
GFR: 113.99 mL/min (ref >60.00)
Glucose, Bld: 151 mg/dL — ABNORMAL HIGH (ref 70–99)
Potassium: 3.8 mEq/L (ref 3.5–5.1)
SODIUM: 137 meq/L (ref 135–145)
Total Bilirubin: 0.3 mg/dL (ref 0.2–1.2)
Total Protein: 8 g/dL (ref 6.0–8.3)

## 2016-11-17 LAB — HEMOGLOBIN A1C: HEMOGLOBIN A1C: 8.1 % — AB (ref 4.6–6.5)

## 2016-11-17 MED ORDER — HYDROCODONE-ACETAMINOPHEN 5-325 MG PO TABS: 1.0000 | ORAL_TABLET | Freq: Four times a day (QID) | ORAL | 0 refills | Status: DC | PRN

## 2016-11-17 MED ORDER — ALPRAZOLAM 0.5 MG PO TABS: 0.5000 mg | ORAL_TABLET | Freq: Three times a day (TID) | ORAL | 1 refills | Status: DC | PRN

## 2016-11-17 NOTE — Telephone Encounter (Signed)
Pt req scripts for aprazolam and norco. Low back pain 3 weeks. Call pt when ready. verfd ph# on file

## 2016-11-17 NOTE — Telephone Encounter (Signed)
Printed /PCP signed Called the patient left a detailed message hardcopy of both are ready for pickup at the front desk.

## 2016-11-17 NOTE — Telephone Encounter (Signed)
Pt req scripts for

## 2016-11-17 NOTE — Telephone Encounter (Signed)
Requesting:  Alprazolam and hydrocodone Contract   08/15/2016 UDS   Low risk next is due on 02/12/2017 Last OV   08/15/2016----future appt is on 02/10/2017 Last Refill  Alprazolam   #90 with 1 refill on 07/29/2016                  Hydrocodone  #100 no refills on 3.8.2018  Please Advise

## 2016-11-17 NOTE — Telephone Encounter (Signed)
OK to refill both meds with 3 month refill on the Alprazolam

## 2016-11-24 ENCOUNTER — Ambulatory Visit: Payer: 59 | Admitting: Family

## 2016-11-24 ENCOUNTER — Telehealth: Payer: Self-pay | Admitting: Family Medicine

## 2016-11-24 NOTE — Telephone Encounter (Signed)
No charge. 
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
## 2016-11-26 ENCOUNTER — Other Ambulatory Visit: Payer: Self-pay | Admitting: Family Medicine

## 2016-12-05 ENCOUNTER — Encounter: Payer: Self-pay | Admitting: Physician Assistant

## 2016-12-09 ENCOUNTER — Ambulatory Visit (INDEPENDENT_AMBULATORY_CARE_PROVIDER_SITE_OTHER): Payer: 59 | Admitting: Physician Assistant

## 2016-12-09 ENCOUNTER — Encounter: Payer: Self-pay | Admitting: Physician Assistant

## 2016-12-09 VITALS — BP 118/80 | HR 98 | Ht 60.0 in | Wt 209.0 lb

## 2016-12-09 DIAGNOSIS — L299 Pruritus, unspecified: Secondary | ICD-10-CM

## 2016-12-09 DIAGNOSIS — I10 Essential (primary) hypertension: Secondary | ICD-10-CM

## 2016-12-09 DIAGNOSIS — R000 Tachycardia, unspecified: Secondary | ICD-10-CM | POA: Diagnosis not present

## 2016-12-09 DIAGNOSIS — E785 Hyperlipidemia, unspecified: Secondary | ICD-10-CM | POA: Diagnosis not present

## 2016-12-09 DIAGNOSIS — G4733 Obstructive sleep apnea (adult) (pediatric): Secondary | ICD-10-CM

## 2016-12-09 MED ORDER — PROPRANOLOL HCL ER 60 MG PO CP24: 60.0000 mg | ORAL_CAPSULE | Freq: Every day | ORAL | 6 refills | Status: DC

## 2016-12-09 NOTE — Progress Notes (Signed)
Cardiology Office Note    Date:  12/09/2016  ID:  Nicole Ball, DOB 15-Nov-1975, MRN [id number] PCP:  Penni Homans, MD  Cardiologist:  Dr. Meda Coffee   Chief Complaint: elevated heart rate  History of Present Illness:  Nicole Ball is a 41 y.o. female with history of DM2, PCOS, early menopause, HTN, anxiety, fatty liver, gallbladder stones, sleep apnea, depression, migraines, obesity who presents for follow-up. She was previously evaluated for palpitations and tachycardia in March 2016 - per Dr. Francesca Oman note, inappropriate sinus tachycardia. She was started on metoprolol. At last OV 03/2015 she was reporting some DOE and thus ETT recommended but never completed by patient. 2D Echo 03/2015: EF 55-60%, no RWMA, mild LAE. She was also started on atorvastatin for hyperlipidemia. Most recent labs showed A1C 8.1, K 3.8, Cr 0.82, alk phos 142 and otherwise nl AST/ALT 11/2016, LDL 52, TSH wnl & Hgb 13.2 08/2016. Husband has HOCM.  She presents back for follow-up. Overall she is doing well. She never went for the ETT as above because her chest discomfort actually resolved with cholecystectomy. She has noticed increased awareness of her elevated HR for the last 3-6 months. She does not drink a lot of fluid. She has concerns about possibility of beta blocker elevating her blood sugar.     Past Medical History:  Diagnosis Date  . Abnormal serum level of alkaline phosphatase 05/21/2014  . Acute low back pain 12/19/2010  . ALLERGIC RHINITIS 10/16/2010  . Allergy    seasonal  . Anxiety and depression 06/07/2011  . Atrophic vaginitis 08/17/2016  . Cervical cancer screening 01/10/2014   Menarche at 8 Regular and moderate flow, became irregular until OCPs  history of abnormal pap in past, repeat was normal Last pap roughly 3 years ago G0P0, s/p No history of abnormal MGM, no h/o MGM  No concerns today no gyn surgeries LMP May 2013  . CTS (carpal tunnel syndrome) 01/15/2014  . Diabetes mellitus type 2 in obese  ([place]) 10/30/2011  . Dry eyes 08/17/2016  . Early menopause   . Elevated BP 04/21/2011  . Essential hypertension   . HIATAL HERNIA WITH REFLUX, HX OF 10/16/2010  . Hyperlipidemia 04/21/2011  . Inappropriate sinus tachycardia   . INSOMNIA 10/16/2010  . Knee pain, left 03/11/2012  . Migraine 03/07/2013  . Obesity   . Palpitations 01/28/2015  . Reflux 04/22/2012  . Sleep apnea 06/07/2011  . Urinary urgency 08/17/2016  . UTI'S, HX OF 10/16/2010    Past Surgical History:  Procedure Laterality Date  . CHOLECYSTECTOMY      Current Medications: Current Outpatient Prescriptions  Medication Sig Dispense Refill  . ACETAMINOPHEN-BUTALBITAL 50-325 MG TABS Take 1 tablet by mouth 2 (two) times daily as needed (HEADACHES). 120 each 0  . ALPRAZolam (XANAX) 0.5 MG tablet Take 1 tablet (0.5 mg total) by mouth 3 (three) times daily as needed for anxiety. 90 tablet 1  . amitriptyline (ELAVIL) 10 MG tablet TAKE 1 TO 2 TABLETS(10 TO 20 MG) BY MOUTH AT BEDTIME AS NEEDED FOR SLEEP 60 tablet 2  . atorvastatin (LIPITOR) 10 MG tablet Take 1 tablet (10 mg total) by mouth daily. 90 tablet 0  . BAYER MICROLET LANCETS lancets Use as directed once daily to check blood sugar.   DX E11.9 100 each 1  . Blood Glucose Monitoring Suppl (CONTOUR BLOOD GLUCOSE SYSTEM) DEVI Use daily to check blood sugar. DX E11.9 1 Device 0  . cyclobenzaprine (FLEXERIL) 10 MG tablet Take 1 tablet (10  mg total) by mouth 2 (two) times daily as needed for muscle spasms. 60 tablet 1  . FLUoxetine (PROZAC) 40 MG capsule Take 1 capsule (40 mg total) by mouth daily. 30 capsule 1  . fluticasone (FLONASE) 50 MCG/ACT nasal spray Place 2 sprays into both nostrils daily. 16 g 6  . glucose blood (BAYER CONTOUR TEST) test strip Use as directed once daily to check blood sugar.  DX E11.9 100 each 5  . HYDROcodone-acetaminophen (NORCO/VICODIN) 5-325 MG tablet Take 1 tablet by mouth every 6 (six) hours as needed. Patient needs appointment for further refills. 100 tablet 0    . ibuprofen (ADVIL,MOTRIN) 800 MG tablet Take 1 tablet (800 mg total) by mouth 3 (three) times daily. (Patient taking differently: Take 800 mg by mouth as needed. ) 21 tablet 0  . metFORMIN (GLUCOPHAGE) 500 MG tablet TAKE 2 TABLETS (500 MG) BY MOUTH TWO TIMES DAILY (Patient taking differently: 500 mg 3 (three) times daily. TAKE 2 TABLETS (500 MG) BY MOUTH TWO TIMES DAILY) 120 tablet 3  . montelukast (SINGULAIR) 10 MG tablet Take 1 tablet (10 mg total) by mouth at bedtime. 30 tablet 6  . omeprazole (PRILOSEC) 20 MG capsule TAKE ONE CAPSULE BY MOUTH TWICE DAILY AS NEEDED FOR REFLUX 60 capsule 2  . omeprazole (PRILOSEC) 20 MG capsule TAKE 1 CAPSULE BY MOUTH TWICE DAILY AS NEEDED FOR REFLUX 60 capsule 0  . promethazine (PHENERGAN) 25 MG tablet Take 1 tablet (25 mg total) by mouth every 8 (eight) hours as needed for nausea or vomiting. 20 tablet 2  . ranitidine (ZANTAC) 300 MG tablet Take 1 tablet (300 mg total) by mouth at bedtime. (Patient taking differently: Take 300 mg by mouth as needed. ) 30 tablet 6  . Saline 0.65 % (SOLN) SOLN Place 2 Squirts into the nose 2 (two) times daily. (Patient taking differently: Place 2 Squirts into the nose 3 (three) times a week. ) 1 Bottle 0  . SUMAtriptan (IMITREX) 50 MG tablet TAKE 1 TABLET BY MOUTH EVERY 2 HOURS AS NEEDED FOR MIGRAINE. NO MORE THAN 2 TABLETS PER 24 HOURS 10 tablet 0  . meloxicam (MOBIC) 15 MG tablet TAKE 1 TABLET BY MOUTH EVERY DAY (Patient not taking: Reported on 12/09/2016) 90 tablet 0  . metoprolol succinate (TOPROL-XL) 50 MG 24 hr tablet TAKE 1 TABLET BY MOUTH DAILY WITH OR IMMEDIATELY FOLLOWING A MEAL. 30 tablet 2   No current facility-administered medications for this visit.      Allergies:   Patient has no known allergies.   Social History   Social History  . Marital status: Married    Spouse name: N/A  . Number of children: N/A  . Years of education: N/A   Social History Main Topics  . Smoking status: Current Some Day Smoker     Packs/day: 0.10    Types: Cigars  . Smokeless tobacco: Never Used     Comment: 1 black and milds  . Alcohol use Yes     Comment: twice/year  . Drug use: No  . Sexual activity: Not Asked   Other Topics Concern  . None   Social History Narrative   Married lives w/ husband, works at [employer]     Family History:  Family History  Problem Relation Age of Onset  . Lupus Mother   . Arthritis Maternal Grandmother   . Scleroderma Maternal Grandmother   . Glaucoma Maternal Grandmother   . Cataracts Maternal Grandmother   . Cancer Maternal Grandfather  prostate  . Coronary artery disease Maternal Grandfather     s/p angioplasty  . Diabetes Maternal Grandfather     Type 2  . Cancer Paternal Grandmother     Breast ca- dx in 43's  . Thyroid disease Paternal Grandmother   . Diabetes Paternal Grandfather   . Thyroid disease Sister   . Asthma Sister   . Allergies Brother   . Arthritis Other     ROS:   Please see the history of present illness. Itchiness in right groin, prior rash present but no longer apparent All other systems are reviewed and otherwise negative.    PHYSICAL EXAM:   VS:  BP 118/80   Pulse 98   Ht 5' (1.524 m)   Wt 209 lb (94.8 kg)   LMP 01/08/2013   SpO2 98%   BMI 40.82 kg/m   BMI: Body mass index is 40.82 kg/m. GEN: Well nourished, well developed AAF, in no acute distress  HEENT: normocephalic, atraumatic Neck: no JVD, carotid bruits, or masses Cardiac: RRR; no murmurs, rubs, or gallops, no edema  Respiratory:  clear to auscultation bilaterally, normal work of breathing GI: soft, nontender, nondistended, + BS MS: no deformity or atrophy  Skin: warm and dry, no rash Neuro:  Alert and Oriented x 3, Strength and sensation are intact, follows commands Psych: euthymic mood, full affect  Wt Readings from Last 3 Encounters:  12/09/16 209 lb (94.8 kg)  08/15/16 210 lb 3.2 oz (95.3 kg)  03/30/16 205 lb (93 kg)      Studies/Labs Reviewed:    EKG:  EKG was ordered today and personally reviewed by me and demonstrates NSR 99bpm, no acute ST-T changes  Recent Labs: 08/15/2016: Hemoglobin 13.2; Platelets 169.0; TSH 1.07 11/17/2016: ALT 18; BUN 12; Creatinine, Ser 0.72; Potassium 3.8; Sodium 137   Lipid Panel    Component Value Date/Time   CHOL 132 08/15/2016 1118   TRIG 84.0 08/15/2016 1118   HDL 63.10 08/15/2016 1118   CHOLHDL 2 08/15/2016 1118   VLDL 16.8 08/15/2016 1118   LDLCALC 52 08/15/2016 1118    Additional studies/ records that were reviewed today include: Summarized above    ASSESSMENT & PLAN:   1. Inappropriate sinus tachycardia - prior workup unremarkable. We discussed that beta blockers can have both hyper- as well as hypoglycemia as side effect; difficult to predict which patients will fall into which categories. She has increased awareness of her heart rate recently. Given her h/o migraines I think it would be worthwhile to try a trial of propranolol in lieu of her metoprolol - will start 107m daily and d/c metoprolol. Discussed importance of observing for blood sugar swings. Also reviewed importance of staying well hydrated (at least 64oz of fluid per day) and reasonable liberalization of salt, as well as overall lifestyle modifications. 2. Essential HTN - controlled. Follow BP with med switch. 3. Sleep apnea - patient reports this isn't really followed by anyone in particular and she may need new equipment. Will refer to Dr. TRadford Paxfor management of this. 4. Hyperlipidemia - well controlled by recent values. 5. Itching - she also asked me to evaluate a possible rash in her right groin. I could not appreciate any rash, lesions, suppuration or significant abnormality. There is slight hyperpigmentation in the creases that may be normal variant from sitting or may represent acanthosis nigricans but I recommended she further f/u with her PCP to discuss.   Disposition: F/u with Dr. NMeda Coffeein 6 months. I think we  can  hold off ETT at this time, reserving testing for any recurrent chest pain.   Medication Adjustments/Labs and Tests Ordered: Current medicines are reviewed at length with the patient today.  Concerns regarding medicines are outlined above. Medication changes, Labs and Tests ordered today are summarized above and listed in the Patient Instructions accessible in Encounters.   Raechel Ache PA-C  12/09/2016 4:21 PM    [facility] [place], [place], [place]  [address] Phone: [phone]; Fax: [phone]

## 2016-12-09 NOTE — Patient Instructions (Signed)
Medication Instructions:  Your physician has recommended you make the following change in your medication: STOP the METOPROLOL. Please start the PROPRANOLOL 60 MG DAILY.   Labwork: None Ordered   Testing/Procedures: None Ordered   Follow-Up: Your physician recommends that you schedule a follow-up appointment in: 6 months with Dr. Delton See  Dr. Mayford Knife for sleep apnea.   Any Other Special Instructions Will Be Listed Below (If Applicable).     If you need a refill on your cardiac medications before your next appointment, please call your pharmacy.

## 2016-12-10 NOTE — Addendum Note (Signed)
Addended by: Luella Cook E on: 12/10/2016 11:53 AM   Modules accepted: Orders

## 2016-12-15 NOTE — Addendum Note (Signed)
Addended by: Baird LyonsPRICE, Noach Calvillo L on: 12/15/2016 05:55 PM   Modules accepted: Orders

## 2016-12-19 ENCOUNTER — Telehealth: Payer: Self-pay | Admitting: Family Medicine

## 2016-12-19 DIAGNOSIS — E669 Obesity, unspecified: Secondary | ICD-10-CM

## 2016-12-19 DIAGNOSIS — M25562 Pain in left knee: Secondary | ICD-10-CM

## 2016-12-19 DIAGNOSIS — R002 Palpitations: Secondary | ICD-10-CM

## 2016-12-19 DIAGNOSIS — M549 Dorsalgia, unspecified: Secondary | ICD-10-CM

## 2016-12-19 DIAGNOSIS — G4700 Insomnia, unspecified: Secondary | ICD-10-CM

## 2016-12-19 DIAGNOSIS — F419 Anxiety disorder, unspecified: Secondary | ICD-10-CM

## 2016-12-19 DIAGNOSIS — E1169 Type 2 diabetes mellitus with other specified complication: Secondary | ICD-10-CM

## 2016-12-19 DIAGNOSIS — R1031 Right lower quadrant pain: Secondary | ICD-10-CM

## 2016-12-19 DIAGNOSIS — E782 Mixed hyperlipidemia: Secondary | ICD-10-CM

## 2016-12-19 MED ORDER — HYDROCODONE-ACETAMINOPHEN 5-325 MG PO TABS: 1.0000 | ORAL_TABLET | Freq: Four times a day (QID) | ORAL | 0 refills | Status: DC | PRN

## 2016-12-19 MED ORDER — MELOXICAM 15 MG PO TABS: 15.0000 mg | ORAL_TABLET | Freq: Every day | ORAL | 0 refills | Status: DC
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
## 2016-12-19 NOTE — Telephone Encounter (Signed)
Will refill on behalf of PCP. 

## 2016-12-19 NOTE — Telephone Encounter (Signed)
Called the patient left message to call back 

## 2016-12-19 NOTE — Telephone Encounter (Signed)
Called the patient informed to pickup hardcopy of the prescripiton

## 2016-12-19 NOTE — Telephone Encounter (Signed)
Alprazolam and amitriptyline have refills---patient notified.  Requesting:  Hydrocodone and mobic Contract   08/15/2016 UDS    Low risk on 7/5 2018 Last OV    08/15/2016---future appt is on 02/10/2017 Last Refill   #100 no refills on 11/17/2016  Please Advise

## 2016-12-19 NOTE — Telephone Encounter (Signed)
Caller name: Relationship to patient: Self Can be reached: [phone]   [facility]  [phone] [place] - [address] AT [facility] [address] & [address] [phone] (Phone) [phone] (Fax)    Reason for call: Refill promethazine (PHENERGAN) 25 MG tablet [[id number]  amitriptyline (ELAVIL) 10 MG tablet [[id number]][[id number]]  meloxicam (MOBIC) 15 MG tablet [[id number]][[id number]]   ALPRAZolam (XANAX) 0.5 MG tablet [[id number]][[id number]]   HYDROcodone-acetaminophen (NORCO/VICODIN) 5-325 MG tablet [[id number]][[id number]]

## 2016-12-19 NOTE — Telephone Encounter (Signed)
Note on previous order in April stated she needs an appt before refill, is that what the July appt is for? Please make sure she is not using ibuprofen with Mobic. TY.

## 2016-12-19 NOTE — Telephone Encounter (Signed)
See telephone note 11/24/16

## 2016-12-22 MED FILL — HYDROCODON-APAP 5-325: 5-325 | 30 days supply | Qty: 100 | Fill #0

## 2017-01-08 ENCOUNTER — Ambulatory Visit: Payer: 59 | Admitting: Cardiology

## 2017-01-08 NOTE — Progress Notes (Deleted)
Cardiology Office Note    Date:  01/08/2017   ID:  Nicole Linesishia Z Larke, DOB September 12, 1975, MRN [id number]  PCP:  Bradd CanaryBlyth, Stacey A, MD  Cardiologist:  Armanda Magicraci Kareemah Grounds, MD   No chief complaint on file.   History of Present Illness:  Nicole Ball is a 41 y.o. female with a history of anxiety and depression, type 2 DM, HTN, hyperlipidemia and OSA.  She has not had an sleep MD following her OSA and is referred by Dr. Delton SeeNelson for evaluation and followup as she needs new supplies.  She does well with her CPAP device.  She tolerates the mask and feels the pressure is adequate. She denies any significant mouth or nasal dryness.  She feels rested in the am and has no significant daytime sleepiness.    Past Medical History:  Diagnosis Date  . Abnormal serum level of alkaline phosphatase 05/21/2014  . Acute low back pain 12/19/2010  . ALLERGIC RHINITIS 10/16/2010  . Allergy    seasonal  . Anxiety and depression 06/07/2011  . Atrophic vaginitis 08/17/2016  . Cervical cancer screening 01/10/2014   Menarche at 8 Regular and moderate flow, became irregular until OCPs  history of abnormal pap in past, repeat was normal Last pap roughly 3 years ago G0P0, s/p No history of abnormal MGM, no h/o MGM  No concerns today no gyn surgeries LMP May 2013  . CTS (carpal tunnel syndrome) 01/15/2014  . Diabetes mellitus type 2 in obese ([facility]) 10/30/2011  . Dry eyes 08/17/2016  . Early menopause   . Elevated BP 04/21/2011  . Essential hypertension   . HIATAL HERNIA WITH REFLUX, HX OF 10/16/2010  . Hyperlipidemia 04/21/2011  . Inappropriate sinus tachycardia   . INSOMNIA 10/16/2010  . Knee pain, left 03/11/2012  . Migraine 03/07/2013  . Obesity   . Palpitations 01/28/2015  . Reflux 04/22/2012  . Sleep apnea 06/07/2011  . Urinary urgency 08/17/2016  . UTI'S, HX OF 10/16/2010    Past Surgical History:  Procedure Laterality Date  . CHOLECYSTECTOMY      Current Medications: No outpatient prescriptions have been marked as taking  for the 01/08/17 encounter (Appointment) with Quintella Reicherturner, Irvan Tiedt R, MD.    Allergies:   Patient has no known allergies.   Social History   Social History  . Marital status: Married    Spouse name: N/A  . Number of children: N/A  . Years of education: N/A   Social History Main Topics  . Smoking status: Current Some Day Smoker    Packs/day: 0.10    Types: Cigars  . Smokeless tobacco: Never Used     Comment: 1 black and milds  . Alcohol use Yes     Comment: twice/year  . Drug use: No  . Sexual activity: Not on file   Other Topics Concern  . Not on file   Social History Narrative   Married lives w/ husband, works at [employer]     Family History:  The patient's family history includes Allergies in her brother; Arthritis in her maternal grandmother and other; Asthma in her sister; Cancer in her maternal grandfather and paternal grandmother; Cataracts in her maternal grandmother; Coronary artery disease in her maternal grandfather; Diabetes in her maternal grandfather and paternal grandfather; Glaucoma in her maternal grandmother; Lupus in her mother; Scleroderma in her maternal grandmother; Thyroid disease in her paternal grandmother and sister.   ROS:   Please see the history of present illness.    ROS All other  systems reviewed and are negative.  No flowsheet data found.     PHYSICAL EXAM:   VS:  LMP 01/08/2013    GEN: Well nourished, well developed, in no acute distress  HEENT: normal  Neck: no JVD, carotid bruits, or masses Cardiac: RRR; no murmurs, rubs, or gallops,no edema.  Intact distal pulses bilaterally.  Respiratory:  clear to auscultation bilaterally, normal work of breathing GI: soft, nontender, nondistended, + BS MS: no deformity or atrophy  Skin: warm and dry, no rash Neuro:  Alert and Oriented x 3, Strength and sensation are intact Psych: euthymic mood, full affect  Wt Readings from Last 3 Encounters:  12/09/16 209 lb (94.8 kg)  08/15/16 210 lb 3.2 oz  (95.3 kg)  03/30/16 205 lb (93 kg)      Studies/Labs Reviewed:   EKG:  EKG is not ordered today.    Recent Labs: 08/15/2016: Hemoglobin 13.2; Platelets 169.0; TSH 1.07 11/17/2016: ALT 18; BUN 12; Creatinine, Ser 0.72; Potassium 3.8; Sodium 137   Lipid Panel    Component Value Date/Time   CHOL 132 08/15/2016 1118   TRIG 84.0 08/15/2016 1118   HDL 63.10 08/15/2016 1118   CHOLHDL 2 08/15/2016 1118   VLDL 16.8 08/15/2016 1118   LDLCALC 52 08/15/2016 1118    Additional studies/ records that were reviewed today include:  CPAP download    ASSESSMENT:    1. OSA (obstructive sleep apnea)   2. Essential hypertension      PLAN:  In order of problems listed above:  OSA - the patient is tolerating PAP therapy well without any problems. The PAP download was reviewed today and showed an AHI of ***/hr on *** cm H2O with ***% compliance in using more than 4 hours nightly.  The patient has been using and benefiting from CPAP use and will continue to benefit from therapy.  HTN - her BP is adequately controlled on exam today.  She will continue on Inderal 60mg  daily.       Medication Adjustments/Labs and Tests Ordered: Current medicines are reviewed at length with the patient today.  Concerns regarding medicines are outlined above.  Medication changes, Labs and Tests ordered today are listed in the Patient Instructions below.  There are no Patient Instructions on file for this visit.   Signed, Armanda Magic, MD  01/08/2017 9:39 AM    [facility] [address] [place], [place], [place]  [address] Phone: [phone]; Fax: [phone]

## 2017-01-13 ENCOUNTER — Encounter: Payer: Self-pay | Admitting: Cardiology

## 2017-02-08 ENCOUNTER — Other Ambulatory Visit: Payer: Self-pay | Admitting: Family Medicine

## 2017-02-10 ENCOUNTER — Encounter: Payer: Self-pay | Admitting: Family Medicine

## 2017-02-10 ENCOUNTER — Ambulatory Visit (INDEPENDENT_AMBULATORY_CARE_PROVIDER_SITE_OTHER): Payer: 59 | Admitting: Family Medicine

## 2017-02-10 DIAGNOSIS — Z79899 Other long term (current) drug therapy: Secondary | ICD-10-CM | POA: Diagnosis not present

## 2017-02-10 DIAGNOSIS — G4700 Insomnia, unspecified: Secondary | ICD-10-CM

## 2017-02-10 DIAGNOSIS — R51 Headache: Secondary | ICD-10-CM | POA: Diagnosis not present

## 2017-02-10 DIAGNOSIS — M25562 Pain in left knee: Secondary | ICD-10-CM

## 2017-02-10 DIAGNOSIS — Z79891 Long term (current) use of opiate analgesic: Secondary | ICD-10-CM | POA: Diagnosis not present

## 2017-02-10 DIAGNOSIS — F329 Major depressive disorder, single episode, unspecified: Secondary | ICD-10-CM | POA: Diagnosis not present

## 2017-02-10 DIAGNOSIS — Z124 Encounter for screening for malignant neoplasm of cervix: Secondary | ICD-10-CM

## 2017-02-10 DIAGNOSIS — I10 Essential (primary) hypertension: Secondary | ICD-10-CM

## 2017-02-10 DIAGNOSIS — E663 Overweight: Secondary | ICD-10-CM | POA: Diagnosis not present

## 2017-02-10 DIAGNOSIS — R002 Palpitations: Secondary | ICD-10-CM

## 2017-02-10 DIAGNOSIS — F32A Depression, unspecified: Secondary | ICD-10-CM

## 2017-02-10 DIAGNOSIS — E1169 Type 2 diabetes mellitus with other specified complication: Secondary | ICD-10-CM

## 2017-02-10 DIAGNOSIS — F419 Anxiety disorder, unspecified: Secondary | ICD-10-CM | POA: Diagnosis not present

## 2017-02-10 DIAGNOSIS — Z0000 Encounter for general adult medical examination without abnormal findings: Secondary | ICD-10-CM | POA: Diagnosis not present

## 2017-02-10 DIAGNOSIS — E669 Obesity, unspecified: Secondary | ICD-10-CM

## 2017-02-10 DIAGNOSIS — R519 Headache, unspecified: Secondary | ICD-10-CM

## 2017-02-10 DIAGNOSIS — R1031 Right lower quadrant pain: Secondary | ICD-10-CM

## 2017-02-10 DIAGNOSIS — F17200 Nicotine dependence, unspecified, uncomplicated: Secondary | ICD-10-CM

## 2017-02-10 DIAGNOSIS — G43809 Other migraine, not intractable, without status migrainosus: Secondary | ICD-10-CM

## 2017-02-10 DIAGNOSIS — E782 Mixed hyperlipidemia: Secondary | ICD-10-CM

## 2017-02-10 HISTORY — DX: Nicotine dependence, unspecified, uncomplicated: F17.200

## 2017-02-10 MED ORDER — HYDROCODONE-ACETAMINOPHEN 5-325 MG PO TABS: 1.0000 | ORAL_TABLET | Freq: Four times a day (QID) | ORAL | 0 refills | Status: DC | PRN

## 2017-02-10 MED ORDER — KETOROLAC TROMETHAMINE 30 MG/ML IJ SOLN
30.0000 mg | Freq: Once | INTRAMUSCULAR | Status: AC
  Administered 2017-02-10: 30 mg via INTRAMUSCULAR

## 2017-02-10 MED ORDER — MELOXICAM 15 MG PO TABS: 15.0000 mg | ORAL_TABLET | Freq: Every day | ORAL | 1 refills | Status: DC | PRN

## 2017-02-10 MED ORDER — KETOROLAC TROMETHAMINE 30 MG/ML IJ SOLN: 30.0000 mg | Freq: Once | INTRAMUSCULAR | Status: DC

## 2017-02-10 MED ORDER — CYCLOBENZAPRINE HCL 10 MG PO TABS: 10.0000 mg | ORAL_TABLET | Freq: Two times a day (BID) | ORAL | 1 refills | Status: DC | PRN

## 2017-02-10 MED ORDER — BUSPIRONE HCL 10 MG PO TABS: 5.0000 mg | ORAL_TABLET | Freq: Three times a day (TID) | ORAL | 2 refills | Status: DC

## 2017-02-10 NOTE — Assessment & Plan Note (Signed)
Patient encouraged to maintain heart healthy diet, regular exercise, adequate sleep. Consider daily probiotics. Take medications as prescribed 

## 2017-02-10 NOTE — Assessment & Plan Note (Signed)
1/2 to 1 cigar a day

## 2017-02-10 NOTE — Patient Instructions (Addendum)
CoverMyMeds  DASH or MIND diet Preventive Care 40-64 Years, Female Preventive care refers to lifestyle choices and visits with your health care provider that can promote health and wellness. What does preventive care include?  A yearly physical exam. This is also called an annual well check.  Dental exams once or twice a year.  Routine eye exams. Ask your health care provider how often you should have your eyes checked.  Personal lifestyle choices, including: ? Daily care of your teeth and gums. ? Regular physical activity. ? Eating a healthy diet. ? Avoiding tobacco and drug use. ? Limiting alcohol use. ? Practicing safe sex. ? Taking low-dose aspirin daily starting at age 89. ? Taking vitamin and mineral supplements as recommended by your health care provider. What happens during an annual well check? The services and screenings done by your health care provider during your annual well check will depend on your age, overall health, lifestyle risk factors, and family history of disease. Counseling Your health care provider may ask you questions about your:  Alcohol use.  Tobacco use.  Drug use.  Emotional well-being.  Home and relationship well-being.  Sexual activity.  Eating habits.  Work and work Statistician.  Method of birth control.  Menstrual cycle.  Pregnancy history.  Screening You may have the following tests or measurements:  Height, weight, and BMI.  Blood pressure.  Lipid and cholesterol levels. These may be checked every 5 years, or more frequently if you are over 81 years old.  Skin check.  Lung cancer screening. You may have this screening every year starting at age 60 if you have a 30-pack-year history of smoking and currently smoke or have quit within the past 15 years.  Fecal occult blood test (FOBT) of the stool. You may have this test every year starting at age 62.  Flexible sigmoidoscopy or colonoscopy. You may have a sigmoidoscopy  every 5 years or a colonoscopy every 10 years starting at age 54.  Hepatitis C blood test.  Hepatitis B blood test.  Sexually transmitted disease (STD) testing.  Diabetes screening. This is done by checking your blood sugar (glucose) after you have not eaten for a while (fasting). You may have this done every 1-3 years.  Mammogram. This may be done every 1-2 years. Talk to your health care provider about when you should start having regular mammograms. This may depend on whether you have a family history of breast cancer.  BRCA-related cancer screening. This may be done if you have a family history of breast, ovarian, tubal, or peritoneal cancers.  Pelvic exam and Pap test. This may be done every 3 years starting at age 60. Starting at age 49, this may be done every 5 years if you have a Pap test in combination with an HPV test.  Bone density scan. This is done to screen for osteoporosis. You may have this scan if you are at high risk for osteoporosis.  Discuss your test results, treatment options, and if necessary, the need for more tests with your health care provider. Vaccines Your health care provider may recommend certain vaccines, such as:  Influenza vaccine. This is recommended every year.  Tetanus, diphtheria, and acellular pertussis (Tdap, Td) vaccine. You may need a Td booster every 10 years.  Varicella vaccine. You may need this if you have not been vaccinated.  Zoster vaccine. You may need this after age 30.  Measles, mumps, and rubella (MMR) vaccine. You may need at least one dose of  MMR if you were born in 1957 or later. You may also need a second dose.  Pneumococcal 13-valent conjugate (PCV13) vaccine. You may need this if you have certain conditions and were not previously vaccinated.  Pneumococcal polysaccharide (PPSV23) vaccine. You may need one or two doses if you smoke cigarettes or if you have certain conditions.  Meningococcal vaccine. You may need this if  you have certain conditions.  Hepatitis A vaccine. You may need this if you have certain conditions or if you travel or work in places where you may be exposed to hepatitis A.  Hepatitis B vaccine. You may need this if you have certain conditions or if you travel or work in places where you may be exposed to hepatitis B.  Haemophilus influenzae type b (Hib) vaccine. You may need this if you have certain conditions.  Talk to your health care provider about which screenings and vaccines you need and how often you need them. This information is not intended to replace advice given to you by your health care provider. Make sure you discuss any questions you have with your health care provider. Document Released: 08/24/2015 Document Revised: 04/16/2016 Document Reviewed: 05/29/2015 Elsevier Interactive Patient Education  2017 [employer].

## 2017-02-10 NOTE — Assessment & Plan Note (Signed)
Encouraged DASH diet, decrease po intake and increase exercise as tolerated. Needs 7-8 hours of sleep nightly. Avoid trans fats, eat small, frequent meals every 4-5 hours with lean proteins, complex carbs and healthy fats. Minimize simple carbs, bariatric referral 

## 2017-02-10 NOTE — Assessment & Plan Note (Signed)
hgba1c unacceptable, minimize simple carbs. Increase exercise as tolerated. Continue current meds,

## 2017-02-10 NOTE — Assessment & Plan Note (Signed)
Well controlled, no changes to meds. Encouraged heart healthy diet such as the DASH diet and exercise as tolerated. Was switched to Propranolol ER 40 mg  By her cardiologist Dr Delton SeeNelson

## 2017-02-10 NOTE — Progress Notes (Signed)
Subjective:  I acted as a Neurosurgeon for Dr. Abner Greenspan. [name], [place]  Patient ID: Nicole Ball, female    DOB: Oct 08, 1975, 41 y.o.   MRN: [id number]  No chief complaint on file.   HPI  Patient is in today for an annual exam. Patient c/o headaches in the frontal lobe. She has had the headache all day and is making her sensitive to light and noise. No recent febrile illness or hospitalization she has a pmh of anxiety, depression, hi cholesterol, hypertension, obesity, chronic pain, allergies and tachycardia. Her job has been very stressful lately and she has started a Veterinary surgeon and that is helpful. Denies CP/palp/SOB/HA/congestion/fevers/GI or GU c/o. Taking meds as prescribed  Patient Care Team: [name], MD as PCP - General (Family Medicine)   Past Medical History:  Diagnosis Date  . Abnormal serum level of alkaline phosphatase 05/21/2014  . Acute low back pain 12/19/2010  . ALLERGIC RHINITIS 10/16/2010  . Allergy    seasonal  . Anxiety and depression 06/07/2011  . Atrophic vaginitis 08/17/2016  . Cervical cancer screening 01/10/2014   Menarche at 8 Regular and moderate flow, became irregular until OCPs  history of abnormal pap in past, repeat was normal Last pap roughly 3 years ago G0P0, s/p No history of abnormal MGM, no h/o MGM  No concerns today no gyn surgeries LMP May 2013  . CTS (carpal tunnel syndrome) 01/15/2014  . Diabetes mellitus type 2 in obese (HCC) 10/30/2011  . Dry eyes 08/17/2016  . Early menopause   . Elevated BP 04/21/2011  . Essential hypertension   . HIATAL HERNIA WITH REFLUX, HX OF 10/16/2010  . Hyperlipidemia 04/21/2011  . Inappropriate sinus tachycardia   . INSOMNIA 10/16/2010  . Knee pain, left 03/11/2012  . Migraine 03/07/2013  . Obesity   . Palpitations 01/28/2015  . Reflux 04/22/2012  . Sleep apnea 06/07/2011  . Tobacco use disorder 02/10/2017  . Urinary urgency 08/17/2016  . UTI'S, HX OF 10/16/2010    Past Surgical History:  Procedure Laterality Date  .  CHOLECYSTECTOMY      Family History  Problem Relation Age of Onset  . Lupus Mother   . Arthritis Maternal Grandmother   . Scleroderma Maternal Grandmother   . Glaucoma Maternal Grandmother   . Cataracts Maternal Grandmother   . Cancer Maternal Grandfather        prostate  . Coronary artery disease Maternal Grandfather        s/p angioplasty  . Diabetes Maternal Grandfather        Type 2  . Cancer Paternal Grandmother        Breast ca- dx in 10's  . Thyroid disease Paternal Grandmother   . Diabetes Paternal Grandfather   . Thyroid disease Sister   . Asthma Sister   . Allergies Brother   . Arthritis Other     Social History   Social History  . Marital status: Married    Spouse name: N/A  . Number of children: N/A  . Years of education: N/A   Occupational History  . Not on file.   Social History Main Topics  . Smoking status: Current Some Day Smoker    Packs/day: 0.10    Types: Cigars  . Smokeless tobacco: Never Used     Comment: 1 black and milds  . Alcohol use Yes     Comment: twice/year  . Drug use: No  . Sexual activity: Not on file   Other Topics Concern  .  Not on file   Social History Narrative   Married lives w/ husband, works at [employer]    Outpatient Medications Prior to Visit  Medication Sig Dispense Refill  . ACETAMINOPHEN-BUTALBITAL 50-325 MG TABS Take 1 tablet by mouth 2 (two) times daily as needed (HEADACHES). 120 each 0  . ALPRAZolam (XANAX) 0.5 MG tablet Take 1 tablet (0.5 mg total) by mouth 3 (three) times daily as needed for anxiety. 90 tablet 1  . amitriptyline (ELAVIL) 10 MG tablet TAKE 1 TO 2 TABLETS(10 TO 20 MG) BY MOUTH AT BEDTIME AS NEEDED FOR SLEEP 60 tablet 2  . atorvastatin (LIPITOR) 10 MG tablet Take 1 tablet (10 mg total) by mouth daily. 90 tablet 0  . BAYER MICROLET LANCETS lancets Use as directed once daily to check blood sugar.   DX E11.9 100 each 1  . Blood Glucose Monitoring Suppl (CONTOUR BLOOD GLUCOSE SYSTEM) DEVI  Use daily to check blood sugar. DX E11.9 1 Device 0  . FLUoxetine (PROZAC) 40 MG capsule Take 1 capsule (40 mg total) by mouth daily. 30 capsule 1  . fluticasone (FLONASE) 50 MCG/ACT nasal spray Place 2 sprays into both nostrils daily. 16 g 6  . glucose blood (BAYER CONTOUR TEST) test strip Use as directed once daily to check blood sugar.  DX E11.9 100 each 5  . ibuprofen (ADVIL,MOTRIN) 800 MG tablet Take 1 tablet (800 mg total) by mouth 3 (three) times daily. (Patient taking differently: Take 800 mg by mouth as needed. ) 21 tablet 0  . metFORMIN (GLUCOPHAGE) 500 MG tablet TAKE 2 TABLETS (500 MG) BY MOUTH TWO TIMES DAILY (Patient taking differently: 500 mg 3 (three) times daily. TAKE 2 TABLETS (500 MG) BY MOUTH TWO TIMES DAILY) 120 tablet 3  . montelukast (SINGULAIR) 10 MG tablet Take 1 tablet (10 mg total) by mouth at bedtime. 30 tablet 6  . omeprazole (PRILOSEC) 20 MG capsule TAKE 1 CAPSULE BY MOUTH TWICE DAILY AS NEEDED FOR REFLUX 60 capsule 0  . promethazine (PHENERGAN) 25 MG tablet TAKE 1 TABLET(25 MG) BY MOUTH EVERY 8 HOURS AS NEEDED FOR NAUSEA OR VOMITING 20 tablet 0  . propranolol ER (INDERAL LA) 60 MG 24 hr capsule Take 1 capsule (60 mg total) by mouth daily. 30 capsule 6  . ranitidine (ZANTAC) 300 MG tablet Take 1 tablet (300 mg total) by mouth at bedtime. (Patient taking differently: Take 300 mg by mouth as needed. ) 30 tablet 6  . Saline 0.65 % (SOLN) SOLN Place 2 Squirts into the nose 2 (two) times daily. (Patient taking differently: Place 2 Squirts into the nose 3 (three) times a week. ) 1 Bottle 0  . SUMAtriptan (IMITREX) 50 MG tablet TAKE 1 TABLET BY MOUTH EVERY 2 HOURS AS NEEDED FOR MIGRAINE. NO MORE THAN 2 TABLETS PER 24 HOURS 10 tablet 0  . cyclobenzaprine (FLEXERIL) 10 MG tablet Take 1 tablet (10 mg total) by mouth 2 (two) times daily as needed for muscle spasms. 60 tablet 1  . HYDROcodone-acetaminophen (NORCO/VICODIN) 5-325 MG tablet Take 1 tablet by mouth every 6 (six) hours as  needed. Patient needs appointment for further refills. 100 tablet 0  . meloxicam (MOBIC) 15 MG tablet Take 1 tablet (15 mg total) by mouth daily. 90 tablet 0  . omeprazole (PRILOSEC) 20 MG capsule TAKE ONE CAPSULE BY MOUTH TWICE DAILY AS NEEDED FOR REFLUX 60 capsule 2   No facility-administered medications prior to visit.     No Known Allergies  Review of Systems  Constitutional: Positive for malaise/fatigue. Negative for fever.  HENT: Negative for congestion.   Eyes: Negative for blurred vision.  Respiratory: Negative for cough and shortness of breath.   Cardiovascular: Negative for chest pain, palpitations and leg swelling.  Gastrointestinal: Negative for vomiting.  Musculoskeletal: Positive for back pain and joint pain.  Skin: Negative for rash.  Neurological: Positive for headaches. Negative for loss of consciousness.  Psychiatric/Behavioral: Positive for depression. The patient is nervous/anxious.        Objective:    Physical Exam  Constitutional: She is oriented to person, place, and time. She appears well-developed and well-nourished. No distress.  HENT:  Head: Normocephalic and atraumatic.  Eyes: Conjunctivae are normal.  Neck: Normal range of motion. No thyromegaly present.  Cardiovascular: Normal rate and regular rhythm.   Pulmonary/Chest: Effort normal and breath sounds normal. She has no wheezes.  Abdominal: Soft. Bowel sounds are normal. There is no tenderness.  Musculoskeletal: Normal range of motion. She exhibits no edema or deformity.  Neurological: She is alert and oriented to person, place, and time.  Skin: Skin is warm and dry. She is not diaphoretic.  Psychiatric: She has a normal mood and affect.    LMP 01/08/2013  Wt Readings from Last 3 Encounters:  12/09/16 209 lb (94.8 kg)  08/15/16 210 lb 3.2 oz (95.3 kg)  03/30/16 205 lb (93 kg)   BP Readings from Last 3 Encounters:  12/09/16 118/80  08/15/16 118/82  03/30/16 130/90     Immunization  History  Administered Date(s) Administered  . Influenza Whole 04/18/2011  . Influenza,inj,Quad PF,36+ Mos 05/20/2013, 05/19/2014, 07/03/2015, 08/15/2016  . Pneumococcal Conjugate-13 12/28/2015  . Tdap 10/14/2013    Health Maintenance  Topic Date Due  . PNEUMOCOCCAL POLYSACCHARIDE VACCINE (1) 05/31/1976  . OPHTHALMOLOGY EXAM  05/24/2014  . URINE MICROALBUMIN  09/16/2016  . FOOT EXAM  12/27/2016  . PAP SMEAR  01/10/2017  . INFLUENZA VACCINE  03/11/2017  . HEMOGLOBIN A1C  05/19/2017  . TETANUS/TDAP  10/15/2023  . HIV Screening  Completed    Lab Results  Component Value Date   WBC 5.4 08/15/2016   HGB 13.2 08/15/2016   HCT 40.2 08/15/2016   PLT 169.0 08/15/2016   GLUCOSE 151 (H) 11/17/2016   CHOL 132 08/15/2016   TRIG 84.0 08/15/2016   HDL 63.10 08/15/2016   LDLCALC 52 08/15/2016   ALT 18 11/17/2016   AST 15 11/17/2016   NA 137 11/17/2016   K 3.8 11/17/2016   CL 104 11/17/2016   CREATININE 0.72 11/17/2016   BUN 12 11/17/2016   CO2 22 11/17/2016   TSH 1.07 08/15/2016   HGBA1C 8.1 (H) 11/17/2016   MICROALBUR <0.7 09/17/2015    Lab Results  Component Value Date   TSH 1.07 08/15/2016   Lab Results  Component Value Date   WBC 5.4 08/15/2016   HGB 13.2 08/15/2016   HCT 40.2 08/15/2016   MCV 87.1 08/15/2016   PLT 169.0 08/15/2016   Lab Results  Component Value Date   NA 137 11/17/2016   K 3.8 11/17/2016   CO2 22 11/17/2016   GLUCOSE 151 (H) 11/17/2016   BUN 12 11/17/2016   CREATININE 0.72 11/17/2016   BILITOT 0.3 11/17/2016   ALKPHOS 142 (H) 11/17/2016   AST 15 11/17/2016   ALT 18 11/17/2016   PROT 8.0 11/17/2016   ALBUMIN 4.2 11/17/2016   CALCIUM 10.0 11/17/2016   GFR 113.99 11/17/2016   Lab Results  Component Value Date   CHOL 132 08/15/2016  Lab Results  Component Value Date   HDL 63.10 08/15/2016   Lab Results  Component Value Date   LDLCALC 52 08/15/2016   Lab Results  Component Value Date   TRIG 84.0 08/15/2016   Lab Results    Component Value Date   CHOLHDL 2 08/15/2016   Lab Results  Component Value Date   HGBA1C 8.1 (H) 11/17/2016         Assessment & Plan:   Problem List Items Addressed This Visit    Overweight    Encouraged DASH diet, decrease po intake and increase exercise as tolerated. Needs 7-8 hours of sleep nightly. Avoid trans fats, eat small, frequent meals every 4-5 hours with lean proteins, complex carbs and healthy fats. Minimize simple carbs, bariatric referral      INSOMNIA   Anxiety and depression    Is under a great deal of stress but is willing to try and make the transition from Alprazolam to buspar 10 mg tab po tid.       Diabetes mellitus type 2 in obese (HCC)    hgba1c unacceptable, minimize simple carbs. Increase exercise as tolerated. Continue current meds,      Knee pain, left   Relevant Medications   HYDROcodone-acetaminophen (NORCO/VICODIN) 5-325 MG tablet   Migraine    Is given a shot of Toradol 30 mg in office and she will go home and take some Phenergan. Hydrate.       Relevant Medications   meloxicam (MOBIC) 15 MG tablet   cyclobenzaprine (FLEXERIL) 10 MG tablet   HYDROcodone-acetaminophen (NORCO/VICODIN) 5-325 MG tablet   ketorolac (TORADOL) 30 MG/ML injection 30 mg (Completed)   Cervical cancer screening   Preventative health care    Patient encouraged to maintain heart healthy diet, regular exercise, adequate sleep. Consider daily probiotics. Take medications as prescribed      Palpitations   HTN (hypertension)    Well controlled, no changes to meds. Encouraged heart healthy diet such as the DASH diet and exercise as tolerated. Was switched to Propranolol ER 40 mg  By her cardiologist Dr Delton SeeNelson      Tobacco use disorder    1/2 to 1 cigar a day       Other Visit Diagnoses    Nonintractable headache, unspecified chronicity pattern, unspecified headache type    -  Primary   Relevant Medications   meloxicam (MOBIC) 15 MG tablet   cyclobenzaprine  (FLEXERIL) 10 MG tablet   HYDROcodone-acetaminophen (NORCO/VICODIN) 5-325 MG tablet   ketorolac (TORADOL) 30 MG/ML injection 30 mg (Completed)   Anxiety       Relevant Medications   busPIRone (BUSPAR) 10 MG tablet   Hyperlipidemia, mixed       RLQ discomfort          I have changed Ms. Downs's meloxicam. I am also having her start on busPIRone. Additionally, I am having her maintain her Saline, ibuprofen, ACETAMINOPHEN-BUTALBITAL, CONTOUR BLOOD GLUCOSE SYSTEM, glucose blood, BAYER MICROLET LANCETS, ranitidine, atorvastatin, FLUoxetine, fluticasone, montelukast, metFORMIN, omeprazole, amitriptyline, ALPRAZolam, propranolol ER, SUMAtriptan, promethazine, cyclobenzaprine, and HYDROcodone-acetaminophen. We administered ketorolac.  Meds ordered this encounter  Medications  . meloxicam (MOBIC) 15 MG tablet    Sig: Take 1 tablet (15 mg total) by mouth daily as needed for pain.    Dispense:  90 tablet    Refill:  1  . cyclobenzaprine (FLEXERIL) 10 MG tablet    Sig: Take 1 tablet (10 mg total) by mouth 2 (two) times daily as needed for muscle  spasms.    Dispense:  60 tablet    Refill:  1  . HYDROcodone-acetaminophen (NORCO/VICODIN) 5-325 MG tablet    Sig: Take 1 tablet by mouth every 6 (six) hours as needed. Patient needs appointment for further refills.    Dispense:  100 tablet    Refill:  0  . busPIRone (BUSPAR) 10 MG tablet    Sig: Take 0.5-1 tablets (5-10 mg total) by mouth 3 (three) times daily.    Dispense:  90 tablet    Refill:  2  . DISCONTD: ketorolac (TORADOL) 30 MG/ML injection 30 mg  . DISCONTD: ketorolac (TORADOL) 30 MG/ML injection 30 mg  . ketorolac (TORADOL) 30 MG/ML injection 30 mg    CMA served as scribe during this visit. History, Physical and Plan performed by medical provider. Documentation and orders reviewed and attested to.  Danise Edge, MD

## 2017-02-11 NOTE — Assessment & Plan Note (Signed)
Is under a great deal of stress but is willing to try and make the transition from Alprazolam to buspar 10 mg tab po tid.

## 2017-02-11 NOTE — Assessment & Plan Note (Signed)
Is given a shot of Toradol 30 mg in office and she will go home and take some Phenergan. Hydrate.

## 2017-03-10 ENCOUNTER — Other Ambulatory Visit: Payer: Self-pay | Admitting: Family Medicine

## 2017-04-01 ENCOUNTER — Telehealth: Payer: Self-pay | Admitting: Family Medicine

## 2017-04-01 DIAGNOSIS — M25562 Pain in left knee: Secondary | ICD-10-CM

## 2017-04-01 NOTE — Telephone Encounter (Signed)
Caller name: Relation to ZP:HXTA Call back number:[phone] Pharmacy:  Reason for call: pt is needing rx HYDROcodone-acetaminophen (NORCO/VICODIN) 5-325 MG tablet, please call when available and ready for pick up

## 2017-04-02 MED ORDER — HYDROCODONE-ACETAMINOPHEN 5-325 MG PO TABS: 1.0000 | ORAL_TABLET | Freq: Four times a day (QID) | ORAL | 0 refills | Status: DC | PRN

## 2017-04-02 NOTE — Telephone Encounter (Signed)
rs printed patient notified   pc

## 2017-04-02 NOTE — Telephone Encounter (Signed)
Requesting:Norco Contract:yes UDS:low risk nxt scrn 02/12/17 Last OV:02/10/17 Next OV:04/14/17 Last Refill:#[phone]   Please advise

## 2017-04-02 NOTE — Telephone Encounter (Signed)
Ok to refill 

## 2017-04-09 ENCOUNTER — Telehealth: Payer: Self-pay | Admitting: Family Medicine

## 2017-04-09 NOTE — Telephone Encounter (Signed)
Pt will be faxing over a biometric form to pcp. Pt says that she have to have sent back in tomorrow as soon as possible.

## 2017-04-10 ENCOUNTER — Other Ambulatory Visit: Payer: Self-pay | Admitting: Family Medicine

## 2017-04-10 DIAGNOSIS — R002 Palpitations: Secondary | ICD-10-CM

## 2017-04-10 DIAGNOSIS — E785 Hyperlipidemia, unspecified: Secondary | ICD-10-CM

## 2017-04-10 DIAGNOSIS — I10 Essential (primary) hypertension: Secondary | ICD-10-CM

## 2017-04-10 NOTE — Telephone Encounter (Signed)
Faxed pt's biometric form to: [phone]

## 2017-04-14 ENCOUNTER — Encounter: Payer: Self-pay | Admitting: Family Medicine

## 2017-04-14 ENCOUNTER — Ambulatory Visit (INDEPENDENT_AMBULATORY_CARE_PROVIDER_SITE_OTHER): Payer: 59 | Admitting: Family Medicine

## 2017-04-14 DIAGNOSIS — G4700 Insomnia, unspecified: Secondary | ICD-10-CM | POA: Diagnosis not present

## 2017-04-14 DIAGNOSIS — E663 Overweight: Secondary | ICD-10-CM | POA: Diagnosis not present

## 2017-04-14 DIAGNOSIS — E669 Obesity, unspecified: Secondary | ICD-10-CM

## 2017-04-14 DIAGNOSIS — E785 Hyperlipidemia, unspecified: Secondary | ICD-10-CM

## 2017-04-14 DIAGNOSIS — E1169 Type 2 diabetes mellitus with other specified complication: Secondary | ICD-10-CM

## 2017-04-14 DIAGNOSIS — I10 Essential (primary) hypertension: Secondary | ICD-10-CM

## 2017-04-14 NOTE — Patient Instructions (Addendum)
Dr Erin FullingHarraway Smith in [facility], Dr Lum KeasSuzanne M Miller   Hypertension Hypertension, commonly called high blood pressure, is when the force of blood pumping through the arteries is too strong. The arteries are the blood vessels that carry blood from the heart throughout the body. Hypertension forces the heart to work harder to pump blood and may cause arteries to become narrow or stiff. Having untreated or uncontrolled hypertension can cause heart attacks, strokes, kidney disease, and other problems. A blood pressure reading consists of a higher number over a lower number. Ideally, your blood pressure should be below 120/80. The first ("top") number is called the systolic pressure. It is a measure of the pressure in your arteries as your heart beats. The second ("bottom") number is called the diastolic pressure. It is a measure of the pressure in your arteries as the heart relaxes. What are the causes? The cause of this condition is not known. What increases the risk? Some risk factors for high blood pressure are under your control. Others are not. Factors you can change  Smoking.  Having type 2 diabetes mellitus, high cholesterol, or both.  Not getting enough exercise or physical activity.  Being overweight.  Having too much fat, sugar, calories, or salt (sodium) in your diet.  Drinking too much alcohol. Factors that are difficult or impossible to change  Having chronic kidney disease.  Having a family history of high blood pressure.  Age. Risk increases with age.  Race. You may be at higher risk if you are African-American.  Gender. Men are at higher risk than women before age 43. After age 43, women are at higher risk than men.  Having obstructive sleep apnea.  Stress. What are the signs or symptoms? Extremely high blood pressure (hypertensive crisis) may cause:  Headache.  Anxiety.  Shortness of breath.  Nosebleed.  Nausea and vomiting.  Severe chest pain.  Jerky  movements you cannot control (seizures).  How is this diagnosed? This condition is diagnosed by measuring your blood pressure while you are seated, with your arm resting on a surface. The cuff of the blood pressure monitor will be placed directly against the skin of your upper arm at the level of your heart. It should be measured at least twice using the same arm. Certain conditions can cause a difference in blood pressure between your right and left arms. Certain factors can cause blood pressure readings to be lower or higher than normal (elevated) for a short period of time:  When your blood pressure is higher when you are in a health care provider's office than when you are at home, this is called white coat hypertension. Most people with this condition do not need medicines.  When your blood pressure is higher at home than when you are in a health care provider's office, this is called masked hypertension. Most people with this condition may need medicines to control blood pressure.  If you have a high blood pressure reading during one visit or you have normal blood pressure with other risk factors:  You may be asked to return on a different day to have your blood pressure checked again.  You may be asked to monitor your blood pressure at home for 1 week or longer.  If you are diagnosed with hypertension, you may have other blood or imaging tests to help your health care provider understand your overall risk for other conditions. How is this treated? This condition is treated by making healthy lifestyle changes, such as  eating healthy foods, exercising more, and reducing your alcohol intake. Your health care provider may prescribe medicine if lifestyle changes are not enough to get your blood pressure under control, and if:  Your systolic blood pressure is above 130.  Your diastolic blood pressure is above 80.  Your personal target blood pressure may vary depending on your medical  conditions, your age, and other factors. Follow these instructions at home: Eating and drinking  Eat a diet that is high in fiber and potassium, and low in sodium, added sugar, and fat. An example eating plan is called the DASH (Dietary Approaches to Stop Hypertension) diet. To eat this way: ? Eat plenty of fresh fruits and vegetables. Try to fill half of your plate at each meal with fruits and vegetables. ? Eat whole grains, such as whole wheat pasta, brown rice, or whole grain bread. Fill about one quarter of your plate with whole grains. ? Eat or drink low-fat dairy products, such as skim milk or low-fat yogurt. ? Avoid fatty cuts of meat, processed or cured meats, and poultry with skin. Fill about one quarter of your plate with lean proteins, such as fish, chicken without skin, beans, eggs, and tofu. ? Avoid premade and processed foods. These tend to be higher in sodium, added sugar, and fat.  Reduce your daily sodium intake. Most people with hypertension should eat less than 1,500 mg of sodium a day.  Limit alcohol intake to no more than 1 drink a day for nonpregnant women and 2 drinks a day for men. One drink equals 12 oz of beer, 5 oz of wine, or 1 oz of hard liquor. Lifestyle  Work with your health care provider to maintain a healthy body weight or to lose weight. Ask what an ideal weight is for you.  Get at least 30 minutes of exercise that causes your heart to beat faster (aerobic exercise) most days of the week. Activities may include walking, swimming, or biking.  Include exercise to strengthen your muscles (resistance exercise), such as pilates or lifting weights, as part of your weekly exercise routine. Try to do these types of exercises for 30 minutes at least 3 days a week.  Do not use any products that contain nicotine or tobacco, such as cigarettes and e-cigarettes. If you need help quitting, ask your health care provider.  Monitor your blood pressure at home as told by  your health care provider.  Keep all follow-up visits as told by your health care provider. This is important. Medicines  Take over-the-counter and prescription medicines only as told by your health care provider. Follow directions carefully. Blood pressure medicines must be taken as prescribed.  Do not skip doses of blood pressure medicine. Doing this puts you at risk for problems and can make the medicine less effective.  Ask your health care provider about side effects or reactions to medicines that you should watch for. Contact a health care provider if:  You think you are having a reaction to a medicine you are taking.  You have headaches that keep coming back (recurring).  You feel dizzy.  You have swelling in your ankles.  You have trouble with your vision. Get help right away if:  You develop a severe headache or confusion.  You have unusual weakness or numbness.  You feel faint.  You have severe pain in your chest or abdomen.  You vomit repeatedly.  You have trouble breathing. Summary  Hypertension is when the force of blood pumping  through your arteries is too strong. If this condition is not controlled, it may put you at risk for serious complications.  Your personal target blood pressure may vary depending on your medical conditions, your age, and other factors. For most people, a normal blood pressure is less than 120/80.  Hypertension is treated with lifestyle changes, medicines, or a combination of both. Lifestyle changes include weight loss, eating a healthy, low-sodium diet, exercising more, and limiting alcohol. This information is not intended to replace advice given to you by your health care provider. Make sure you discuss any questions you have with your health care provider. Document Released: 07/28/2005 Document Revised: 06/25/2016 Document Reviewed: 06/25/2016 Elsevier Interactive Patient Education  [employer].

## 2017-04-14 NOTE — Telephone Encounter (Signed)
Completed as much as possible; forwarded to provider/SLS 09/04

## 2017-04-14 NOTE — Telephone Encounter (Signed)
Pt has appt this afternoon/Rx will be addressed then/thx dmf

## 2017-04-14 NOTE — Assessment & Plan Note (Signed)
Well controlled, no changes to meds. Encouraged heart healthy diet such as the DASH diet and exercise as tolerated.  °

## 2017-04-14 NOTE — Assessment & Plan Note (Signed)
hgba1c acceptable but slightly increased, minimize simple carbs. Increase exercise as tolerated. Continue current meds

## 2017-04-14 NOTE — Assessment & Plan Note (Signed)
Encouraged good sleep hygiene such as dark, quiet room. No blue/green glowing lights such as computer screens in bedroom. No alcohol or stimulants in evening. Cut down on caffeine as able. Regular exercise is helpful but not just prior to bed time.  

## 2017-04-14 NOTE — Assessment & Plan Note (Signed)
Encouraged heart healthy diet, increase exercise, avoid trans fats, consider a krill oil cap daily 

## 2017-04-14 NOTE — Progress Notes (Signed)
Subjective:  I acted as a Neurosurgeon for Dr. Abner Greenspan. [name], [place]  Patient ID: Nicole Ball, female    DOB: 02-13-1976, 41 y.o.   MRN: [id number]  Chief Complaint  Patient presents with  . Follow-up    Patient is here today for a follow-up.  She states that she needs to get a PAP done and blood work.    HPI  Patient is in today for a follow up. She is struggling with chronic fatigue and stress but denies any recent febrile illness or hospitalization. Her job and home lives are both very hectic and stressful at this time. Notes anhedonia but no suicidal ideation. Denies CP/palp/SOB/HA/congestion/fevers/GI or GU c/o. Taking meds as prescribed  Patient Care Team: [name], MD as PCP - General (Family Medicine)   Past Medical History:  Diagnosis Date  . Abnormal serum level of alkaline phosphatase 05/21/2014  . Acute low back pain 12/19/2010  . ALLERGIC RHINITIS 10/16/2010  . Allergy    seasonal  . Anxiety and depression 06/07/2011  . Atrophic vaginitis 08/17/2016  . Cervical cancer screening 01/10/2014   Menarche at 8 Regular and moderate flow, became irregular until OCPs  history of abnormal pap in past, repeat was normal Last pap roughly 3 years ago G0P0, s/p No history of abnormal MGM, no h/o MGM  No concerns today no gyn surgeries LMP May 2013  . CTS (carpal tunnel syndrome) 01/15/2014  . Diabetes mellitus type 2 in obese (HCC) 10/30/2011  . Dry eyes 08/17/2016  . Early menopause   . Elevated BP 04/21/2011  . Essential hypertension   . HIATAL HERNIA WITH REFLUX, HX OF 10/16/2010  . Hyperlipidemia 04/21/2011  . Inappropriate sinus tachycardia   . INSOMNIA 10/16/2010  . Knee pain, left 03/11/2012  . Migraine 03/07/2013  . Obesity   . Palpitations 01/28/2015  . Reflux 04/22/2012  . Sleep apnea 06/07/2011  . Tobacco use disorder 02/10/2017  . Urinary urgency 08/17/2016  . UTI'S, HX OF 10/16/2010    Past Surgical History:  Procedure Laterality Date  . CHOLECYSTECTOMY      Family  History  Problem Relation Age of Onset  . Lupus Mother   . Arthritis Maternal Grandmother   . Scleroderma Maternal Grandmother   . Glaucoma Maternal Grandmother   . Cataracts Maternal Grandmother   . Cancer Maternal Grandfather        prostate  . Coronary artery disease Maternal Grandfather        s/p angioplasty  . Diabetes Maternal Grandfather        Type 2  . Cancer Paternal Grandmother        Breast ca- dx in 46's  . Thyroid disease Paternal Grandmother   . Diabetes Paternal Grandfather   . Thyroid disease Sister   . Asthma Sister   . Allergies Brother   . Arthritis Other     Social History   Social History  . Marital status: Married    Spouse name: N/A  . Number of children: N/A  . Years of education: N/A   Occupational History  . Not on file.   Social History Main Topics  . Smoking status: Current Some Day Smoker    Packs/day: 0.10    Types: Cigars  . Smokeless tobacco: Never Used     Comment: 1 black and milds  . Alcohol use Yes     Comment: twice/year  . Drug use: No  . Sexual activity: Not on file   Other Topics  Concern  . Not on file   Social History Narrative   Married lives w/ husband, works at [employer]    Outpatient Medications Prior to Visit  Medication Sig Dispense Refill  . ACETAMINOPHEN-BUTALBITAL 50-325 MG TABS Take 1 tablet by mouth 2 (two) times daily as needed (HEADACHES). 120 each 0  . amitriptyline (ELAVIL) 10 MG tablet TAKE 1 TO 2 TABLETS(10 TO 20 MG) BY MOUTH AT BEDTIME AS NEEDED FOR SLEEP 60 tablet 2  . atorvastatin (LIPITOR) 10 MG tablet Take 1 tablet (10 mg total) by mouth daily. 90 tablet 0  . BAYER MICROLET LANCETS lancets Use as directed once daily to check blood sugar.   DX E11.9 100 each 1  . Blood Glucose Monitoring Suppl (CONTOUR BLOOD GLUCOSE SYSTEM) DEVI Use daily to check blood sugar. DX E11.9 1 Device 0  . busPIRone (BUSPAR) 10 MG tablet Take 0.5-1 tablets (5-10 mg total) by mouth 3 (three) times daily. 90 tablet  2  . CONTOUR NEXT TEST test strip USE TO CHECK BLOOD SUGAR DAILY 100 each 2  . cyclobenzaprine (FLEXERIL) 10 MG tablet Take 1 tablet (10 mg total) by mouth 2 (two) times daily as needed for muscle spasms. 60 tablet 1  . FLUoxetine (PROZAC) 40 MG capsule Take 1 capsule (40 mg total) by mouth daily. 30 capsule 1  . fluticasone (FLONASE) 50 MCG/ACT nasal spray Place 2 sprays into both nostrils daily. 16 g 6  . HYDROcodone-acetaminophen (NORCO/VICODIN) 5-325 MG tablet Take 1 tablet by mouth every 6 (six) hours as needed. Patient needs appointment for further refills. 100 tablet 0  . meloxicam (MOBIC) 15 MG tablet Take 1 tablet (15 mg total) by mouth daily as needed for pain. 90 tablet 1  . metFORMIN (GLUCOPHAGE) 500 MG tablet TAKE 2 TABLETS (500 MG) BY MOUTH TWO TIMES DAILY (Patient taking differently: 500 mg 3 (three) times daily. TAKE 2 TABLETS (500 MG) BY MOUTH TWO TIMES DAILY) 120 tablet 3  . montelukast (SINGULAIR) 10 MG tablet Take 1 tablet (10 mg total) by mouth at bedtime. 30 tablet 6  . omeprazole (PRILOSEC) 20 MG capsule TAKE 1 CAPSULE BY MOUTH TWICE DAILY AS NEEDED FOR REFLUX 60 capsule 0  . promethazine (PHENERGAN) 25 MG tablet TAKE 1 TABLET(25 MG) BY MOUTH EVERY 8 HOURS AS NEEDED FOR NAUSEA OR VOMITING 20 tablet 0  . propranolol ER (INDERAL LA) 60 MG 24 hr capsule Take 1 capsule (60 mg total) by mouth daily. 30 capsule 6  . ranitidine (ZANTAC) 300 MG tablet Take 1 tablet (300 mg total) by mouth at bedtime. (Patient taking differently: Take 300 mg by mouth as needed. ) 30 tablet 6  . Saline 0.65 % (SOLN) SOLN Place 2 Squirts into the nose 2 (two) times daily. (Patient taking differently: Place 2 Squirts into the nose 3 (three) times a week. ) 1 Bottle 0  . SUMAtriptan (IMITREX) 50 MG tablet TAKE 1 TABLET BY MOUTH EVERY 2 HOURS AS NEEDED FOR MIGRAINE. NO MORE THAN 2 TABLETS PER 24 HOURS 10 tablet 0  . ALPRAZolam (XANAX) 0.5 MG tablet Take 1 tablet (0.5 mg total) by mouth 3 (three) times daily  as needed for anxiety. (Patient not taking: Reported on 04/14/2017) 90 tablet 1  . ibuprofen (ADVIL,MOTRIN) 800 MG tablet Take 1 tablet (800 mg total) by mouth 3 (three) times daily. (Patient taking differently: Take 800 mg by mouth as needed. ) 21 tablet 0   No facility-administered medications prior to visit.     No  Known Allergies  Review of Systems  Constitutional: Positive for malaise/fatigue. Negative for fever.  HENT: Negative for congestion.   Eyes: Negative for blurred vision.  Respiratory: Negative for shortness of breath.   Cardiovascular: Negative for chest pain, palpitations and leg swelling.  Gastrointestinal: Negative for abdominal pain, blood in stool and nausea.  Genitourinary: Negative for dysuria and frequency.  Musculoskeletal: Negative for falls.  Skin: Negative for rash.  Neurological: Negative for dizziness, loss of consciousness and headaches.  Endo/Heme/Allergies: Negative for environmental allergies.  Psychiatric/Behavioral: Negative for depression. The patient is nervous/anxious.        Objective:    Physical Exam  Constitutional: She is oriented to person, place, and time. She appears well-developed and well-nourished. No distress.  HENT:  Head: Normocephalic and atraumatic.  Nose: Nose normal.  Eyes: Right eye exhibits no discharge. Left eye exhibits no discharge.  Neck: Normal range of motion. Neck supple.  Cardiovascular: Normal rate and regular rhythm.   No murmur heard. Pulmonary/Chest: Effort normal and breath sounds normal.  Abdominal: Soft. Bowel sounds are normal. There is no tenderness.  Musculoskeletal: She exhibits no edema.  Neurological: She is alert and oriented to person, place, and time.  Skin: Skin is warm and dry.  Psychiatric: She has a normal mood and affect.  Nursing note and vitals reviewed.   LMP 01/08/2013  Wt Readings from Last 3 Encounters:  12/09/16 209 lb (94.8 kg)  08/15/16 210 lb 3.2 oz (95.3 kg)  03/30/16 205 lb  (93 kg)   BP Readings from Last 3 Encounters:  12/09/16 118/80  08/15/16 118/82  03/30/16 130/90     Immunization History  Administered Date(s) Administered  . Influenza Whole 04/18/2011  . Influenza,inj,Quad PF,6+ Mos 05/20/2013, 05/19/2014, 07/03/2015, 08/15/2016  . Pneumococcal Conjugate-13 12/28/2015  . Tdap 10/14/2013    Health Maintenance  Topic Date Due  . PNEUMOCOCCAL POLYSACCHARIDE VACCINE (1) 05/31/1976  . URINE MICROALBUMIN  09/16/2016  . FOOT EXAM  12/27/2016  . PAP SMEAR  01/10/2017  . OPHTHALMOLOGY EXAM  02/08/2017  . INFLUENZA VACCINE  03/11/2017  . HEMOGLOBIN A1C  10/12/2017  . TETANUS/TDAP  10/15/2023  . HIV Screening  Completed    Lab Results  Component Value Date   WBC 6.2 04/14/2017   HGB 12.8 04/14/2017   HCT 40.1 04/14/2017   PLT 212.0 04/14/2017   GLUCOSE 127 (H) 04/14/2017   CHOL 142 04/14/2017   TRIG 164.0 (H) 04/14/2017   HDL 61.90 04/14/2017   LDLCALC 47 04/14/2017   ALT 18 04/14/2017   AST 14 04/14/2017   NA 139 04/14/2017   K 3.5 04/14/2017   CL 104 04/14/2017   CREATININE 0.72 04/14/2017   BUN 14 04/14/2017   CO2 24 04/14/2017   TSH 1.26 04/14/2017   HGBA1C 8.1 (H) 04/14/2017   MICROALBUR <0.7 09/17/2015    Lab Results  Component Value Date   TSH 1.26 04/14/2017   Lab Results  Component Value Date   WBC 6.2 04/14/2017   HGB 12.8 04/14/2017   HCT 40.1 04/14/2017   MCV 88.3 04/14/2017   PLT 212.0 04/14/2017   Lab Results  Component Value Date   NA 139 04/14/2017   K 3.5 04/14/2017   CO2 24 04/14/2017   GLUCOSE 127 (H) 04/14/2017   BUN 14 04/14/2017   CREATININE 0.72 04/14/2017   BILITOT 0.2 04/14/2017   ALKPHOS 178 (H) 04/14/2017   AST 14 04/14/2017   ALT 18 04/14/2017   PROT 8.0 04/14/2017   ALBUMIN  4.6 04/14/2017   CALCIUM 10.7 (H) 04/14/2017   GFR 113.76 04/14/2017   Lab Results  Component Value Date   CHOL 142 04/14/2017   Lab Results  Component Value Date   HDL 61.90 04/14/2017   Lab Results    Component Value Date   LDLCALC 47 04/14/2017   Lab Results  Component Value Date   TRIG 164.0 (H) 04/14/2017   Lab Results  Component Value Date   CHOLHDL 2 04/14/2017   Lab Results  Component Value Date   HGBA1C 8.1 (H) 04/14/2017         Assessment & Plan:   Problem List Items Addressed This Visit    RESOLVED: Overweight    Encouraged DASH diet, decrease po intake and increase exercise as tolerated. Needs 7-8 hours of sleep nightly. Avoid trans fats, eat small, frequent meals every 4-5 hours with lean proteins, complex proteins and healthy fats. Minimize simple carbs      INSOMNIA    Encouraged good sleep hygiene such as dark, quiet room. No blue/green glowing lights such as computer screens in bedroom. No alcohol or stimulants in evening. Cut down on caffeine as able. Regular exercise is helpful but not just prior to bed time.       Hyperlipidemia    Encouraged heart healthy diet, increase exercise, avoid trans fats, consider a krill oil cap daily      Relevant Orders   Lipid panel (Completed)   Diabetes mellitus type 2 in obese (HCC)    hgba1c acceptable but slightly increased, minimize simple carbs. Increase exercise as tolerated. Continue current meds      Relevant Orders   Hemoglobin A1c (Completed)   HTN (hypertension)    Well controlled, no changes to meds. Encouraged heart healthy diet such as the DASH diet and exercise as tolerated.       Relevant Orders   CBC (Completed)   Comprehensive metabolic panel (Completed)   TSH (Completed)      I have discontinued Ms. Gallier's ibuprofen and ALPRAZolam. I am also having her maintain her Saline, ACETAMINOPHEN-BUTALBITAL, CONTOUR BLOOD GLUCOSE SYSTEM, BAYER MICROLET LANCETS, ranitidine, atorvastatin, FLUoxetine, fluticasone, montelukast, metFORMIN, omeprazole, amitriptyline, propranolol ER, SUMAtriptan, promethazine, meloxicam, cyclobenzaprine, busPIRone, CONTOUR NEXT TEST, and HYDROcodone-acetaminophen.  No  orders of the defined types were placed in this encounter.   CMA served as Neurosurgeonscribe during this visit. History, Physical and Plan performed by medical provider. Documentation and orders reviewed and attested to.  Danise EdgeStacey Blyth, MD

## 2017-04-15 LAB — CBC
HCT: 40.1 % (ref 36.0–46.0)
HEMOGLOBIN: 12.8 g/dL (ref 12.0–15.0)
MCHC: 31.9 g/dL (ref 30.0–36.0)
MCV: 88.3 fl (ref 78.0–100.0)
PLATELETS: 212 10*3/uL (ref 150.0–400.0)
RBC: 4.55 Mil/uL (ref 3.87–5.11)
RDW: 15.3 % (ref 11.5–15.5)
WBC: 6.2 10*3/uL (ref 4.0–10.5)

## 2017-04-15 LAB — COMPREHENSIVE METABOLIC PANEL
ALBUMIN: 4.6 g/dL (ref 3.5–5.2)
ALK PHOS: 178 U/L — AB (ref 39–117)
ALT: 18 U/L (ref 0–35)
AST: 14 U/L (ref 0–37)
BILIRUBIN TOTAL: 0.2 mg/dL (ref 0.2–1.2)
BUN: 14 mg/dL (ref 6–23)
CALCIUM: 10.7 mg/dL — AB (ref 8.4–10.5)
CO2: 24 meq/L (ref 19–32)
CREATININE: 0.72 mg/dL (ref 0.40–1.20)
Chloride: 104 mEq/L (ref 96–112)
GFR: 113.76 mL/min (ref >60.00)
Glucose, Bld: 127 mg/dL — ABNORMAL HIGH (ref 70–99)
Potassium: 3.5 mEq/L (ref 3.5–5.1)
Sodium: 139 mEq/L (ref 135–145)
TOTAL PROTEIN: 8 g/dL (ref 6.0–8.3)

## 2017-04-15 LAB — LIPID PANEL
CHOL/HDL RATIO: 2
CHOLESTEROL: 142 mg/dL (ref 0–200)
HDL: 61.9 mg/dL (ref >39.00)
LDL Cholesterol: 47 mg/dL (ref 0–99)
NonHDL: 80.16
TRIGLYCERIDES: 164 mg/dL — AB (ref 0.0–149.0)
VLDL: 32.8 mg/dL (ref 0.0–40.0)

## 2017-04-15 LAB — HEMOGLOBIN A1C: HEMOGLOBIN A1C: 8.1 % — AB (ref 4.6–6.5)

## 2017-04-15 LAB — TSH: TSH: 1.26 u[IU]/mL (ref 0.35–4.50)

## 2017-04-19 NOTE — Assessment & Plan Note (Signed)
Encouraged DASH diet, decrease po intake and increase exercise as tolerated. Needs 7-8 hours of sleep nightly. Avoid trans fats, eat small, frequent meals every 4-5 hours with lean proteins, complex proteins and healthy fats. Minimize simple carbs

## 2017-04-24 ENCOUNTER — Other Ambulatory Visit: Payer: Self-pay | Admitting: Family Medicine

## 2017-04-24 DIAGNOSIS — R002 Palpitations: Secondary | ICD-10-CM

## 2017-04-24 DIAGNOSIS — I10 Essential (primary) hypertension: Secondary | ICD-10-CM

## 2017-04-24 DIAGNOSIS — E785 Hyperlipidemia, unspecified: Secondary | ICD-10-CM

## 2017-04-24 DIAGNOSIS — M549 Dorsalgia, unspecified: Secondary | ICD-10-CM

## 2017-05-05 ENCOUNTER — Telehealth: Payer: Self-pay | Admitting: Family Medicine

## 2017-05-05 ENCOUNTER — Other Ambulatory Visit: Payer: Self-pay | Admitting: Family Medicine

## 2017-05-05 ENCOUNTER — Ambulatory Visit: Payer: 59

## 2017-05-05 ENCOUNTER — Other Ambulatory Visit: Payer: Self-pay

## 2017-05-05 DIAGNOSIS — R748 Abnormal levels of other serum enzymes: Secondary | ICD-10-CM

## 2017-05-05 DIAGNOSIS — M25562 Pain in left knee: Secondary | ICD-10-CM

## 2017-05-05 MED ORDER — NYSTATIN 100000 UNIT/GM EX CREA: 1.0000 "application " | TOPICAL_CREAM | Freq: Two times a day (BID) | CUTANEOUS | 2 refills | Status: DC

## 2017-05-05 MED ORDER — BUTALBITAL-ACETAMINOPHEN 50-325 MG PO TABS: 1.0000 | ORAL_TABLET | Freq: Two times a day (BID) | ORAL | 0 refills | Status: DC | PRN

## 2017-05-05 MED ORDER — HYDROCODONE-ACETAMINOPHEN 5-325 MG PO TABS: 1.0000 | ORAL_TABLET | Freq: Four times a day (QID) | ORAL | 0 refills | Status: DC | PRN

## 2017-05-05 MED ORDER — RANITIDINE HCL 300 MG PO CAPS: 300.0000 mg | ORAL_CAPSULE | Freq: Every evening | ORAL | 3 refills | Status: DC

## 2017-05-05 MED ORDER — AMITRIPTYLINE HCL 10 MG PO TABS: 10.0000 mg | ORAL_TABLET | Freq: Every day | ORAL | 2 refills | Status: DC

## 2017-05-05 MED ORDER — FLUCONAZOLE 150 MG PO TABS: 150.0000 mg | ORAL_TABLET | ORAL | 0 refills | Status: DC

## 2017-05-05 NOTE — Telephone Encounter (Signed)
Needs cmp, vitamin D, pth and cpep for abnormal calcium and abnormal alk phos

## 2017-05-05 NOTE — Telephone Encounter (Signed)
°  Relation to [id number] Call back [phone] Pharmacy:  Reason for call:  Patient states as per conversation she would like to schedule lab appointment in 4 weeks, requesting orders, please advise

## 2017-05-06 NOTE — Telephone Encounter (Signed)
Patient scheduled for labs only for 06/04/2017

## 2017-05-07 ENCOUNTER — Other Ambulatory Visit: Payer: Self-pay | Admitting: Family Medicine

## 2017-05-07 DIAGNOSIS — F419 Anxiety disorder, unspecified: Secondary | ICD-10-CM

## 2017-05-07 NOTE — Telephone Encounter (Signed)
Noted lab orders placed  PC

## 2017-06-04 ENCOUNTER — Other Ambulatory Visit: Payer: 59

## 2017-06-09 ENCOUNTER — Other Ambulatory Visit: Payer: 59

## 2017-06-11 ENCOUNTER — Other Ambulatory Visit: Payer: Self-pay | Admitting: Family Medicine

## 2017-06-12 ENCOUNTER — Telehealth: Payer: Self-pay | Admitting: Family Medicine

## 2017-06-12 DIAGNOSIS — M25562 Pain in left knee: Secondary | ICD-10-CM

## 2017-06-12 NOTE — Telephone Encounter (Signed)
8.1   8.1CM   8.0CM     Patient had A1c done on 04/14/17&  6mhts ago both readings 8.1. Will insurance pay for another one so soon    Please advise

## 2017-06-12 NOTE — Telephone Encounter (Signed)
Patient is requesting lab orders for A1C check.

## 2017-06-14 NOTE — Telephone Encounter (Signed)
No does not need hgba1c til 90 day mark

## 2017-06-15 NOTE — Telephone Encounter (Signed)
Called patient and left detailed message for patient

## 2017-06-17 MED ORDER — SUMATRIPTAN SUCCINATE 50 MG PO TABS: 50.0000 mg | ORAL_TABLET | ORAL | 3 refills | Status: DC | PRN

## 2017-06-17 NOTE — Telephone Encounter (Signed)
OK to refill requested med just confirm contract is completed.

## 2017-06-17 NOTE — Telephone Encounter (Signed)
Pt is requesting refill on Hydrocodone-acetaminophen 5-325mg .  Last OV: 04/14/2017 Last Fill: 05/05/2017 #100 and 0RF UDS: 02/10/2017 Low risk  Please advise.

## 2017-06-18 MED ORDER — HYDROCODONE-ACETAMINOPHEN 5-325 MG PO TABS: 1.0000 | ORAL_TABLET | Freq: Four times a day (QID) | ORAL | 0 refills | Status: DC | PRN

## 2017-06-18 NOTE — Telephone Encounter (Signed)
rx printed pcp signed  Called patient ready for pick up in the front

## 2017-07-14 ENCOUNTER — Encounter: Payer: Self-pay | Admitting: Family Medicine

## 2017-07-14 ENCOUNTER — Other Ambulatory Visit (HOSPITAL_COMMUNITY)
Admission: RE | Admit: 2017-07-14 | Discharge: 2017-07-14 | Disposition: A | Discharge status: Home / Self Care | Payer: 59 | Source: Ambulatory Visit | Attending: Family Medicine | Admitting: Family Medicine

## 2017-07-14 ENCOUNTER — Ambulatory Visit (INDEPENDENT_AMBULATORY_CARE_PROVIDER_SITE_OTHER): Payer: 59 | Admitting: Family Medicine

## 2017-07-14 VITALS — BP 128/86 | HR 86 | Temp 98.1°F | Resp 18 | Wt 203.6 lb

## 2017-07-14 DIAGNOSIS — E669 Obesity, unspecified: Secondary | ICD-10-CM

## 2017-07-14 DIAGNOSIS — N761 Subacute and chronic vaginitis: Secondary | ICD-10-CM | POA: Diagnosis not present

## 2017-07-14 DIAGNOSIS — Z124 Encounter for screening for malignant neoplasm of cervix: Secondary | ICD-10-CM | POA: Diagnosis not present

## 2017-07-14 DIAGNOSIS — E1169 Type 2 diabetes mellitus with other specified complication: Secondary | ICD-10-CM

## 2017-07-14 DIAGNOSIS — I10 Essential (primary) hypertension: Secondary | ICD-10-CM

## 2017-07-14 DIAGNOSIS — Z1231 Encounter for screening mammogram for malignant neoplasm of breast: Secondary | ICD-10-CM | POA: Diagnosis not present

## 2017-07-14 DIAGNOSIS — Z1239 Encounter for other screening for malignant neoplasm of breast: Secondary | ICD-10-CM

## 2017-07-14 DIAGNOSIS — M791 Myalgia, unspecified site: Secondary | ICD-10-CM | POA: Diagnosis not present

## 2017-07-14 DIAGNOSIS — E782 Mixed hyperlipidemia: Secondary | ICD-10-CM | POA: Diagnosis not present

## 2017-07-14 NOTE — Assessment & Plan Note (Signed)
Encouraged heart healthy diet, increase exercise, avoid trans fats, consider a krill oil cap daily 

## 2017-07-14 NOTE — Assessment & Plan Note (Addendum)
Pap today, no concerns on exam. Clear discharge is noted.

## 2017-07-14 NOTE — Progress Notes (Signed)
Subjective:  I acted as a Neurosurgeon for Dr. Abner Greenspan. Nicole Ball, Nicole Ball  Patient ID: Nicole Ball, female    DOB: [birth year], 41 y.o.   MRN: [id number]  No chief complaint on file.   HPI  Patient is in today for a 3 month follow up and in need of her pap smear.  She feels well today.  No recent febrile illness or hospitalization.  She is noting some worsening myalgias in her legs most notably at night.  Does disrupt her sleep at times.  No recent acute injury or hospitalization. Poorly controlled will alter medications, encouraged DASH diet, minimize caffeine and obtain adequate sleep. Report concerning symptoms and follow up as directed and as needed  Patient Care Team: [name], MD as PCP - General (Family Medicine)   Past Medical History:  Diagnosis Date  . Abnormal serum level of alkaline phosphatase 05/21/2014  . Acute low back pain 12/19/2010  . ALLERGIC RHINITIS 10/16/2010  . Allergy    seasonal  . Anxiety and depression 06/07/2011  . Atrophic vaginitis 08/17/2016  . Cervical cancer screening 01/10/2014   Menarche at 8 Regular and moderate flow, became irregular until OCPs  history of abnormal pap in past, repeat was normal Last pap roughly 3 years ago G0P0, s/p No history of abnormal MGM, no h/o MGM  No concerns today no gyn surgeries LMP May 2013  . CTS (carpal tunnel syndrome) 01/15/2014  . Diabetes mellitus type 2 in obese (HCC) 10/30/2011  . Dry eyes 08/17/2016  . Early menopause   . Elevated BP 04/21/2011  . Essential hypertension   . HIATAL HERNIA WITH REFLUX, HX OF 10/16/2010  . Hyperlipidemia 04/21/2011  . Inappropriate sinus tachycardia   . INSOMNIA 10/16/2010  . Knee pain, left 03/11/2012  . Migraine 03/07/2013  . Obesity   . Palpitations 01/28/2015  . Reflux 04/22/2012  . Sleep apnea 06/07/2011  . Tobacco use disorder 02/10/2017  . Urinary urgency 08/17/2016  . UTI'S, HX OF 10/16/2010    Past Surgical History:  Procedure Laterality Date  . CHOLECYSTECTOMY      Family  History  Problem Relation Age of Onset  . Lupus Mother   . Arthritis Maternal Grandmother   . Scleroderma Maternal Grandmother   . Glaucoma Maternal Grandmother   . Cataracts Maternal Grandmother   . Cancer Maternal Grandfather        prostate  . Coronary artery disease Maternal Grandfather        s/p angioplasty  . Diabetes Maternal Grandfather        Type 2  . Cancer Paternal Grandmother        Breast ca- dx in 34's  . Thyroid disease Paternal Grandmother   . Diabetes Paternal Grandfather   . Thyroid disease Sister   . Asthma Sister   . Allergies Brother   . Arthritis Other     Social History   Socioeconomic History  . Marital status: Married    Spouse name: Not on file  . Number of children: Not on file  . Years of education: Not on file  . Highest education level: Not on file  Social Needs  . Financial resource strain: Not on file  . Food insecurity - worry: Not on file  . Food insecurity - inability: Not on file  . Transportation needs - medical: Not on file  . Transportation needs - non-medical: Not on file  Occupational History  . Not on file  Tobacco Use  . Smoking  status: Current Some Day Smoker    Packs/day: 0.10    Types: Cigars  . Smokeless tobacco: Never Used  . Tobacco comment: 1 black and milds  Substance and Sexual Activity  . Alcohol use: Yes    Comment: twice/year  . Drug use: No  . Sexual activity: Not on file  Other Topics Concern  . Not on file  Social History Narrative   Married lives w/ husband, works at [employer]    Outpatient Medications Prior to Visit  Medication Sig Dispense Refill  . ACETAMINOPHEN-BUTALBITAL 50-325 MG TABS Take 1 tablet by mouth 2 (two) times daily as needed (HEADACHES). 120 each 0  . amitriptyline (ELAVIL) 10 MG tablet Take 1 tablet (10 mg total) by mouth at bedtime. 60 tablet 2  . atorvastatin (LIPITOR) 10 MG tablet TAKE 1 TABLET(10 MG) BY MOUTH DAILY 90 tablet 0  . BAYER MICROLET LANCETS lancets Use as  directed once daily to check blood sugar.   DX E11.9 100 each 1  . Blood Glucose Monitoring Suppl (CONTOUR BLOOD GLUCOSE SYSTEM) DEVI Use daily to check blood sugar. DX E11.9 1 Device 0  . busPIRone (BUSPAR) 10 MG tablet Take 0.5-1 tablets (5-10 mg total) by mouth 3 (three) times daily. 90 tablet 2  . CONTOUR NEXT TEST test strip USE TO CHECK BLOOD SUGAR DAILY 100 each 2  . cyclobenzaprine (FLEXERIL) 10 MG tablet Take 1 tablet (10 mg total) by mouth 2 (two) times daily as needed for muscle spasms. 60 tablet 1  . fluconazole (DIFLUCAN) 150 MG tablet Take 1 tablet (150 mg total) by mouth once a week. 3 tablet 0  . FLUoxetine (PROZAC) 40 MG capsule TAKE 1 CAPSULE(40 MG) BY MOUTH DAILY 90 capsule 0  . fluticasone (FLONASE) 50 MCG/ACT nasal spray Place 2 sprays into both nostrils daily. 16 g 6  . HYDROcodone-acetaminophen (NORCO/VICODIN) 5-325 MG tablet Take 1 tablet every 6 (six) hours as needed by mouth. Patient needs appointment for further refills. 100 tablet 0  . meloxicam (MOBIC) 15 MG tablet Take 1 tablet (15 mg total) by mouth daily as needed for pain. 90 tablet 1  . metFORMIN (GLUCOPHAGE) 500 MG tablet TAKE 2 TABLETS (500 MG) BY MOUTH TWO TIMES DAILY (Patient taking differently: 500 mg 3 (three) times daily. TAKE 2 TABLETS (500 MG) BY MOUTH TWO TIMES DAILY) 120 tablet 3  . montelukast (SINGULAIR) 10 MG tablet Take 1 tablet (10 mg total) by mouth at bedtime. 30 tablet 6  . nystatin cream (MYCOSTATIN) Apply 1 application topically 2 (two) times daily. 30 g 2  . omeprazole (PRILOSEC) 20 MG capsule TAKE 1 CAPSULE BY MOUTH TWICE DAILY AS NEEDED FOR REFLUX 60 capsule 0  . promethazine (PHENERGAN) 25 MG tablet TAKE 1 TABLET(25 MG) BY MOUTH EVERY 8 HOURS AS NEEDED FOR NAUSEA OR VOMITING 20 tablet 0  . propranolol ER (INDERAL LA) 60 MG 24 hr capsule Take 1 capsule (60 mg total) by mouth daily. 30 capsule 6  . ranitidine (ZANTAC) 300 MG capsule Take 1 capsule (300 mg total) by mouth every evening. 30  capsule 3  . ranitidine (ZANTAC) 300 MG tablet Take 1 tablet (300 mg total) by mouth at bedtime. (Patient taking differently: Take 300 mg by mouth as needed. ) 30 tablet 6  . Saline 0.65 % (SOLN) SOLN Place 2 Squirts into the nose 2 (two) times daily. (Patient taking differently: Place 2 Squirts into the nose 3 (three) times a week. ) 1 Bottle 0  . SUMAtriptan (  IMITREX) 50 MG tablet Take 1 tablet (50 mg total) every 2 (two) hours as needed by mouth for migraine. May repeat in 2 hours if headache persists or recurs. 10 tablet 3  . cyclobenzaprine (FLEXERIL) 10 MG tablet TAKE 1 TABLET(10 MG) BY MOUTH TWICE DAILY AS NEEDED FOR MUSCLE SPASMS 60 tablet 0   No facility-administered medications prior to visit.     No Known Allergies  Review of Systems  Constitutional: Negative for fever and malaise/fatigue.  HENT: Negative for congestion.   Eyes: Negative for blurred vision.  Respiratory: Negative for shortness of breath.   Cardiovascular: Negative for chest pain, palpitations and leg swelling.  Gastrointestinal: Negative for abdominal pain, blood in stool and nausea.  Genitourinary: Negative for dysuria and frequency.  Musculoskeletal: Positive for myalgias. Negative for falls.  Skin: Negative for rash.  Neurological: Negative for dizziness, loss of consciousness and headaches.  Endo/Heme/Allergies: Negative for environmental allergies.  Psychiatric/Behavioral: Negative for depression. The patient is not nervous/anxious.        Objective:    Physical Exam  Constitutional: She is oriented to person, place, and time. She appears well-developed and well-nourished. No distress.  HENT:  Head: Normocephalic and atraumatic.  Nose: Nose normal.  Eyes: Right eye exhibits no discharge. Left eye exhibits no discharge.  Neck: Normal range of motion. Neck supple.  Cardiovascular: Normal rate and regular rhythm.  No murmur heard. Pulmonary/Chest: Effort normal and breath sounds normal.  Abdominal:  Soft. Bowel sounds are normal. There is no tenderness.  Musculoskeletal: She exhibits no edema.  Neurological: She is alert and oriented to person, place, and time.  Skin: Skin is warm and dry.  Psychiatric: She has a normal mood and affect.  Nursing note and vitals reviewed.   BP 128/86 (BP Location: Left Arm, Patient Position: Sitting, Cuff Size: Normal)   Pulse 86   Temp 98.1 F (36.7 C) (Oral)   Resp 18   Wt 203 lb 9.6 oz (92.4 kg)   LMP 01/08/2013   SpO2 98%   BMI 39.76 kg/m  Wt Readings from Last 3 Encounters:  07/14/17 203 lb 9.6 oz (92.4 kg)  12/09/16 209 lb (94.8 kg)  08/15/16 210 lb 3.2 oz (95.3 kg)   BP Readings from Last 3 Encounters:  07/14/17 128/86  12/09/16 118/80  08/15/16 118/82     Immunization History  Administered Date(s) Administered  . Influenza Whole 04/18/2011  . Influenza,inj,Quad PF,6+ Mos 05/20/2013, 05/19/2014, 07/03/2015, 08/15/2016  . Pneumococcal Conjugate-13 12/28/2015  . Tdap 10/14/2013    Health Maintenance  Topic Date Due  . PNEUMOCOCCAL POLYSACCHARIDE VACCINE (1) 05/31/1976  . URINE MICROALBUMIN  09/16/2016  . FOOT EXAM  12/27/2016  . OPHTHALMOLOGY EXAM  02/08/2017  . INFLUENZA VACCINE  03/11/2017  . HEMOGLOBIN A1C  10/12/2017  . PAP SMEAR  07/14/2020  . TETANUS/TDAP  10/15/2023  . HIV Screening  Completed    Lab Results  Component Value Date   WBC 6.2 04/14/2017   HGB 12.8 04/14/2017   HCT 40.1 04/14/2017   PLT 212.0 04/14/2017   GLUCOSE 127 (H) 04/14/2017   CHOL 142 04/14/2017   TRIG 164.0 (H) 04/14/2017   HDL 61.90 04/14/2017   LDLCALC 47 04/14/2017   ALT 18 04/14/2017   AST 14 04/14/2017   NA 139 04/14/2017   K 3.5 04/14/2017   CL 104 04/14/2017   CREATININE 0.72 04/14/2017   BUN 14 04/14/2017   CO2 24 04/14/2017   TSH 1.26 04/14/2017   HGBA1C 8.1 (H)  04/14/2017   MICROALBUR <0.7 09/17/2015    Lab Results  Component Value Date   TSH 1.26 04/14/2017   Lab Results  Component Value Date   WBC 6.2  04/14/2017   HGB 12.8 04/14/2017   HCT 40.1 04/14/2017   MCV 88.3 04/14/2017   PLT 212.0 04/14/2017   Lab Results  Component Value Date   NA 139 04/14/2017   K 3.5 04/14/2017   CO2 24 04/14/2017   GLUCOSE 127 (H) 04/14/2017   BUN 14 04/14/2017   CREATININE 0.72 04/14/2017   BILITOT 0.2 04/14/2017   ALKPHOS 178 (H) 04/14/2017   AST 14 04/14/2017   ALT 18 04/14/2017   PROT 8.0 04/14/2017   ALBUMIN 4.6 04/14/2017   CALCIUM 10.7 (H) 04/14/2017   GFR 113.76 04/14/2017   Lab Results  Component Value Date   CHOL 142 04/14/2017   Lab Results  Component Value Date   HDL 61.90 04/14/2017   Lab Results  Component Value Date   LDLCALC 47 04/14/2017   Lab Results  Component Value Date   TRIG 164.0 (H) 04/14/2017   Lab Results  Component Value Date   CHOLHDL 2 04/14/2017   Lab Results  Component Value Date   HGBA1C 8.1 (H) 04/14/2017         Assessment & Plan:   Problem List Items Addressed This Visit    Hyperlipidemia    Encouraged heart healthy diet, increase exercise, avoid trans fats, consider a krill oil cap daily.      Relevant Orders   Lipid panel   Diabetes mellitus type 2 in obese (HCC)    hgba1c acceptable, minimize simple carbs. Increase exercise as tolerated. Continue current meds      Relevant Orders   Hemoglobin A1c   Cervical cancer screening - Primary    Pap today, no concerns on exam. Clear discharge is noted.      Relevant Orders   Cytology - PAP (Completed)   HTN (hypertension)    Well controlled, no changes to meds. Encouraged heart healthy diet such as the DASH diet and exercise as tolerated.       Relevant Orders   CBC   CBC   Comprehensive metabolic panel   TSH   Myalgia    Increase hydration, increase exercise. Add magnesium and try Hyland's leg cramp medicine prn       Other Visit Diagnoses    Subacute vaginitis       Breast cancer screening       Relevant Orders   MM DIAG BREAST TOMO BILATERAL      I am having  Nicole Ball maintain her Saline, CONTOUR BLOOD GLUCOSE SYSTEM, BAYER MICROLET LANCETS, ranitidine, fluticasone, montelukast, metFORMIN, omeprazole, propranolol ER, meloxicam, cyclobenzaprine, busPIRone, CONTOUR NEXT TEST, atorvastatin, fluconazole, nystatin cream, ACETAMINOPHEN-BUTALBITAL, amitriptyline, ranitidine, FLUoxetine, promethazine, SUMAtriptan, and HYDROcodone-acetaminophen.  No orders of the defined types were placed in this encounter.   CMA served as Neurosurgeon during this visit. History, Physical and Plan performed by medical provider. Documentation and orders reviewed and attested to.  Danise Edge, MD

## 2017-07-14 NOTE — Assessment & Plan Note (Signed)
Well controlled, no changes to meds. Encouraged heart healthy diet such as the DASH diet and exercise as tolerated.  °

## 2017-07-14 NOTE — Patient Instructions (Addendum)
Dr Jewel Baizearcy Ward on [address] [facility]  Magnesium 64 oz of clear fluid Dr Margart SicklesScholl's inserts Hypertension Hypertension is another name for high blood pressure. High blood pressure forces your heart to work harder to pump blood. This can cause problems over time. There are two numbers in a blood pressure reading. There is a top number (systolic) over a bottom number (diastolic). It is best to have a blood pressure below 120/80. Healthy choices can help lower your blood pressure. You may need medicine to help lower your blood pressure if:  Your blood pressure cannot be lowered with healthy choices.  Your blood pressure is higher than 130/80.  Follow these instructions at home: Eating and drinking  If directed, follow the DASH eating plan. This diet includes: ? Filling half of your plate at each meal with fruits and vegetables. ? Filling one quarter of your plate at each meal with whole grains. Whole grains include whole wheat pasta, brown rice, and whole grain bread. ? Eating or drinking low-fat dairy products, such as skim milk or low-fat yogurt. ? Filling one quarter of your plate at each meal with low-fat (lean) proteins. Low-fat proteins include fish, skinless chicken, eggs, beans, and tofu. ? Avoiding fatty meat, cured and processed meat, or chicken with skin. ? Avoiding premade or processed food.  Eat less than 1,500 mg of salt (sodium) a day.  Limit alcohol use to no more than 1 drink a day for nonpregnant women and 2 drinks a day for men. One drink equals 12 oz of beer, 5 oz of wine, or 1 oz of hard liquor. Lifestyle  Work with your doctor to stay at a healthy weight or to lose weight. Ask your doctor what the best weight is for you.  Get at least 30 minutes of exercise that causes your heart to beat faster (aerobic exercise) most days of the week. This may include walking, swimming, or biking.  Get at least 30 minutes of exercise that strengthens your muscles  (resistance exercise) at least 3 days a week. This may include lifting weights or pilates.  Do not use any products that contain nicotine or tobacco. This includes cigarettes and e-cigarettes. If you need help quitting, ask your doctor.  Check your blood pressure at home as told by your doctor.  Keep all follow-up visits as told by your doctor. This is important. Medicines  Take over-the-counter and prescription medicines only as told by your doctor. Follow directions carefully.  Do not skip doses of blood pressure medicine. The medicine does not work as well if you skip doses. Skipping doses also puts you at risk for problems.  Ask your doctor about side effects or reactions to medicines that you should watch for. Contact a doctor if:  You think you are having a reaction to the medicine you are taking.  You have headaches that keep coming back (recurring).  You feel dizzy.  You have swelling in your ankles.  You have trouble with your vision. Get help right away if:  You get a very bad headache.  You start to feel confused.  You feel weak or numb.  You feel faint.  You get very bad pain in your: ? Chest. ? Belly (abdomen).  You throw up (vomit) more than once.  You have trouble breathing. Summary  Hypertension is another name for high blood pressure.  Making healthy choices can help lower blood pressure. If your blood pressure cannot be controlled with healthy choices, you may need  to take medicine. This information is not intended to replace advice given to you by your health care provider. Make sure you discuss any questions you have with your health care provider. Document Released: 01/14/2008 Document Revised: 06/25/2016 Document Reviewed: 06/25/2016 Elsevier Interactive Patient Education  [employer].

## 2017-07-14 NOTE — Assessment & Plan Note (Signed)
hgba1c acceptable, minimize simple carbs. Increase exercise as tolerated. Continue current meds 

## 2017-07-16 ENCOUNTER — Other Ambulatory Visit: Payer: Self-pay | Admitting: Family Medicine

## 2017-07-16 DIAGNOSIS — M549 Dorsalgia, unspecified: Secondary | ICD-10-CM

## 2017-07-16 LAB — CYTOLOGY - PAP
Bacterial vaginitis: NEGATIVE
CANDIDA VAGINITIS: NEGATIVE
Chlamydia: NEGATIVE
Diagnosis: NEGATIVE
HPV (WINDOPATH): NOT DETECTED
Neisseria Gonorrhea: NEGATIVE
TRICH (WINDOWPATH): NEGATIVE

## 2017-07-21 DIAGNOSIS — M791 Myalgia, unspecified site: Secondary | ICD-10-CM | POA: Insufficient documentation

## 2017-07-21 LAB — CERVICOVAGINAL ANCILLARY ONLY: Herpes: NEGATIVE

## 2017-07-21 NOTE — Assessment & Plan Note (Signed)
Increase hydration, increase exercise. Add magnesium and try Hyland's leg cramp medicine prn

## 2017-07-23 ENCOUNTER — Other Ambulatory Visit: Payer: Self-pay | Admitting: Family Medicine

## 2017-07-23 DIAGNOSIS — M25562 Pain in left knee: Secondary | ICD-10-CM

## 2017-07-27 MED ORDER — HYDROCODONE-ACETAMINOPHEN 5-325 MG PO TABS: 1.0000 | ORAL_TABLET | Freq: Four times a day (QID) | ORAL | 0 refills | Status: DC | PRN

## 2017-07-27 NOTE — Telephone Encounter (Signed)
As long as her contract and UDS are UTD she can have a refill

## 2017-07-27 NOTE — Telephone Encounter (Signed)
Pt calling for update on refill.

## 2017-07-27 NOTE — Telephone Encounter (Signed)
Pt informed via MyChart that Rx has been placed at front desk for pick up at her convenience.  

## 2017-07-27 NOTE — Telephone Encounter (Signed)
Pt is requesting refill on hydrocodone-acetaminophen 5-325mg .  Last OV: 07/14/2017 Last Fill: 06/18/2017 #100 and 0RF UDS: 02/10/2017 Low risk   Please advise.

## 2017-07-27 NOTE — Telephone Encounter (Signed)
Rx printed, awaiting MD signature.  

## 2017-08-10 ENCOUNTER — Other Ambulatory Visit: Payer: Self-pay | Admitting: Family Medicine

## 2017-08-10 DIAGNOSIS — R002 Palpitations: Secondary | ICD-10-CM

## 2017-08-10 DIAGNOSIS — E785 Hyperlipidemia, unspecified: Secondary | ICD-10-CM

## 2017-08-10 DIAGNOSIS — I10 Essential (primary) hypertension: Secondary | ICD-10-CM

## 2017-08-12 ENCOUNTER — Other Ambulatory Visit: Payer: Self-pay | Admitting: Family Medicine

## 2017-08-12 DIAGNOSIS — F419 Anxiety disorder, unspecified: Secondary | ICD-10-CM

## 2017-08-26 ENCOUNTER — Other Ambulatory Visit: Payer: Self-pay | Admitting: Physician Assistant

## 2017-08-31 ENCOUNTER — Other Ambulatory Visit: Payer: Self-pay | Admitting: Family Medicine

## 2017-09-07 ENCOUNTER — Ambulatory Visit
Admission: RE | Admit: 2017-09-07 | Discharge: 2017-09-07 | Disposition: A | Discharge status: Home / Self Care | Payer: 59 | Source: Ambulatory Visit | Attending: Family Medicine | Admitting: Family Medicine

## 2017-09-07 DIAGNOSIS — Z1239 Encounter for other screening for malignant neoplasm of breast: Secondary | ICD-10-CM

## 2017-09-07 DIAGNOSIS — Z1231 Encounter for screening mammogram for malignant neoplasm of breast: Secondary | ICD-10-CM | POA: Diagnosis not present

## 2017-09-08 ENCOUNTER — Other Ambulatory Visit: Payer: Self-pay | Admitting: Family Medicine

## 2017-09-08 DIAGNOSIS — Z79899 Other long term (current) drug therapy: Secondary | ICD-10-CM

## 2017-09-08 DIAGNOSIS — M25562 Pain in left knee: Secondary | ICD-10-CM

## 2017-09-09 MED ORDER — OMEPRAZOLE 20 MG PO CPDR: 20.0000 mg | DELAYED_RELEASE_CAPSULE | Freq: Two times a day (BID) | ORAL | 2 refills | Status: DC

## 2017-09-09 MED ORDER — HYDROCODONE-ACETAMINOPHEN 5-325 MG PO TABS: 1.0000 | ORAL_TABLET | Freq: Four times a day (QID) | ORAL | 0 refills | Status: DC | PRN

## 2017-09-09 MED ORDER — PROMETHAZINE HCL 25 MG PO TABS: 25.0000 mg | ORAL_TABLET | Freq: Three times a day (TID) | ORAL | 0 refills | Status: DC | PRN

## 2017-09-09 MED ORDER — BAYER CONTOUR MONITOR DEVI: 0 refills | Status: DC

## 2017-09-09 NOTE — Telephone Encounter (Signed)
Requesting: hydrocodone Contract: 08/18/16 needs updated csc UDS:02/10/17 Last Visit:07/14/17 Next Visit:10/13/17 Last Refill:07/27/17  Please Advise

## 2017-09-09 NOTE — Telephone Encounter (Signed)
Can have refill on both meds but needs update on UDS and contract and check the data base

## 2017-09-10 ENCOUNTER — Other Ambulatory Visit: Payer: Self-pay | Admitting: Family Medicine

## 2017-09-10 NOTE — Addendum Note (Signed)
Addended by: Crissie SicklesARTER, [place] Dowd A on: 09/10/2017 09:40 AM   Modules accepted: Orders

## 2017-09-23 ENCOUNTER — Telehealth: Payer: Self-pay | Admitting: Family Medicine

## 2017-09-23 NOTE — Telephone Encounter (Signed)
Called to reschedule appt pt says she needs a refill on Diflucan. She asked would someone please contact her.

## 2017-09-26 ENCOUNTER — Other Ambulatory Visit: Payer: Self-pay | Admitting: Family Medicine

## 2017-09-28 MED ORDER — FLUCONAZOLE 150 MG PO TABS: 150.0000 mg | ORAL_TABLET | ORAL | 0 refills | Status: DC

## 2017-09-28 NOTE — Addendum Note (Signed)
Addended by: Crissie SicklesARTER, [place] Pamer A on: 09/28/2017 02:21 PM   Modules accepted: Orders

## 2017-09-28 NOTE — Telephone Encounter (Signed)
Medication has been sent over.

## 2017-09-30 ENCOUNTER — Encounter: Payer: Self-pay | Admitting: Cardiology

## 2017-10-06 ENCOUNTER — Other Ambulatory Visit: Payer: Self-pay | Admitting: Family Medicine

## 2017-10-06 DIAGNOSIS — M25562 Pain in left knee: Secondary | ICD-10-CM

## 2017-10-06 MED ORDER — HYDROCODONE-ACETAMINOPHEN 5-325 MG PO TABS: 1.0000 | ORAL_TABLET | Freq: Four times a day (QID) | ORAL | 0 refills | Status: DC | PRN

## 2017-10-07 ENCOUNTER — Other Ambulatory Visit: Payer: Self-pay | Admitting: Family Medicine

## 2017-10-07 ENCOUNTER — Ambulatory Visit: Payer: 59 | Admitting: Cardiology

## 2017-10-13 ENCOUNTER — Ambulatory Visit: Payer: 59 | Admitting: Family Medicine

## 2017-10-13 ENCOUNTER — Encounter: Payer: Self-pay | Admitting: Family Medicine

## 2017-10-13 ENCOUNTER — Encounter: Payer: Self-pay | Admitting: Cardiology

## 2017-10-13 DIAGNOSIS — Z79899 Other long term (current) drug therapy: Secondary | ICD-10-CM | POA: Diagnosis not present

## 2017-10-13 DIAGNOSIS — E669 Obesity, unspecified: Secondary | ICD-10-CM | POA: Diagnosis not present

## 2017-10-13 DIAGNOSIS — I10 Essential (primary) hypertension: Secondary | ICD-10-CM | POA: Diagnosis not present

## 2017-10-13 DIAGNOSIS — E1169 Type 2 diabetes mellitus with other specified complication: Secondary | ICD-10-CM | POA: Diagnosis not present

## 2017-10-13 DIAGNOSIS — E782 Mixed hyperlipidemia: Secondary | ICD-10-CM

## 2017-10-13 DIAGNOSIS — G43809 Other migraine, not intractable, without status migrainosus: Secondary | ICD-10-CM

## 2017-10-13 DIAGNOSIS — R51 Headache: Secondary | ICD-10-CM | POA: Diagnosis not present

## 2017-10-13 DIAGNOSIS — R197 Diarrhea, unspecified: Secondary | ICD-10-CM | POA: Insufficient documentation

## 2017-10-13 DIAGNOSIS — R519 Headache, unspecified: Secondary | ICD-10-CM | POA: Insufficient documentation

## 2017-10-13 MED ORDER — ESTROGENS, CONJUGATED 0.625 MG/GM VA CREA: 1.0000 | TOPICAL_CREAM | VAGINAL | Status: DC

## 2017-10-13 MED FILL — PREMARIN VAGINAL CREAM-APPL: 0.625 | 30 days supply | Qty: 30 | Fill #0

## 2017-10-13 NOTE — Assessment & Plan Note (Signed)
hgba1c acceptable, minimize simple carbs. Increase exercise as tolerated. Continue current meds 

## 2017-10-13 NOTE — Assessment & Plan Note (Signed)
Encouraged heart healthy diet, increase exercise, avoid trans fats, consider a krill oil cap daily 

## 2017-10-13 NOTE — Assessment & Plan Note (Signed)
Well controlled, no changes to meds. Encouraged heart healthy diet such as the DASH diet and exercise as tolerated.  °

## 2017-10-13 NOTE — Assessment & Plan Note (Signed)
Add a fiber supplement once to three times

## 2017-10-13 NOTE — Assessment & Plan Note (Signed)
Encouraged increased hydration, 64 ounces of clear fluids daily. Minimize alcohol and caffeine. Eat small frequent meals with lean proteins and complex carbs. Avoid high and low blood sugars. Get adequate sleep, 7-8 hours a night. Needs exercise daily preferably in the morning.  

## 2017-10-13 NOTE — Patient Instructions (Addendum)
Metamucil or Benefiber powder one to three x daily  DASH Eating Plan DASH stands for "Dietary Approaches to Stop Hypertension." The DASH eating plan is a healthy eating plan that has been shown to reduce high blood pressure (hypertension). It may also reduce your risk for type 2 diabetes, heart disease, and stroke. The DASH eating plan may also help with weight loss. What are tips for following this plan? General guidelines  Avoid eating more than 2,300 mg (milligrams) of salt (sodium) a day. If you have hypertension, you may need to reduce your sodium intake to 1,500 mg a day.  Limit alcohol intake to no more than 1 drink a day for nonpregnant women and 2 drinks a day for men. One drink equals 12 oz of beer, 5 oz of wine, or 1 oz of hard liquor.  Work with your health care provider to maintain a healthy body weight or to lose weight. Ask what an ideal weight is for you.  Get at least 30 minutes of exercise that causes your heart to beat faster (aerobic exercise) most days of the week. Activities may include walking, swimming, or biking.  Work with your health care provider or diet and nutrition specialist (dietitian) to adjust your eating plan to your individual calorie needs. Reading food labels  Check food labels for the amount of sodium per serving. Choose foods with less than 5 percent of the Daily Value of sodium. Generally, foods with less than 300 mg of sodium per serving fit into this eating plan.  To find whole grains, look for the word "whole" as the first word in the ingredient list. Shopping  Buy products labeled as "low-sodium" or "no salt added."  Buy fresh foods. Avoid canned foods and premade or frozen meals. Cooking  Avoid adding salt when cooking. Use salt-free seasonings or herbs instead of table salt or sea salt. Check with your health care provider or pharmacist before using salt substitutes.  Do not fry foods. Cook foods using healthy methods such as baking,  boiling, grilling, and broiling instead.  Cook with heart-healthy oils, such as olive, canola, soybean, or sunflower oil. Meal planning   Eat a balanced diet that includes: ? 5 or more servings of fruits and vegetables each day. At each meal, try to fill half of your plate with fruits and vegetables. ? Up to 6-8 servings of whole grains each day. ? Less than 6 oz of lean meat, poultry, or fish each day. A 3-oz serving of meat is about the same size as a deck of cards. One egg equals 1 oz. ? 2 servings of low-fat dairy each day. ? A serving of nuts, seeds, or beans 5 times each week. ? Heart-healthy fats. Healthy fats called Omega-3 fatty acids are found in foods such as flaxseeds and coldwater fish, like sardines, salmon, and mackerel.  Limit how much you eat of the following: ? Canned or prepackaged foods. ? Food that is high in trans fat, such as fried foods. ? Food that is high in saturated fat, such as fatty meat. ? Sweets, desserts, sugary drinks, and other foods with added sugar. ? Full-fat dairy products.  Do not salt foods before eating.  Try to eat at least 2 vegetarian meals each week.  Eat more home-cooked food and less restaurant, buffet, and fast food.  When eating at a restaurant, ask that your food be prepared with less salt or no salt, if possible. What foods are recommended? The items listed may not  be a complete list. Talk with your dietitian about what dietary choices are best for you. Grains Whole-grain or whole-wheat bread. Whole-grain or whole-wheat pasta. Brown rice. [place]row. Bulgur. Whole-grain and low-sodium cereals. Pita bread. Low-fat, low-sodium crackers. Whole-wheat flour tortillas. Vegetables Fresh or frozen vegetables (raw, steamed, roasted, or grilled). Low-sodium or reduced-sodium tomato and vegetable juice. Low-sodium or reduced-sodium tomato sauce and tomato paste. Low-sodium or reduced-sodium canned vegetables. Fruits All fresh, dried, or  frozen fruit. Canned fruit in natural juice (without added sugar). Meat and other protein foods Skinless chicken or [place]. Ground chicken or [place]. Pork with fat trimmed off. Fish and seafood. Egg whites. Dried beans, peas, or lentils. Unsalted nuts, nut butters, and seeds. Unsalted canned beans. Lean cuts of beef with fat trimmed off. Low-sodium, lean deli meat. Dairy Low-fat (1%) or fat-free (skim) milk. Fat-free, low-fat, or reduced-fat cheeses. Nonfat, low-sodium ricotta or cottage cheese. Low-fat or nonfat yogurt. Low-fat, low-sodium cheese. Fats and oils Soft margarine without trans fats. Vegetable oil. Low-fat, reduced-fat, or light mayonnaise and salad dressings (reduced-sodium). Canola, safflower, olive, soybean, and sunflower oils. Avocado. Seasoning and other foods Herbs. Spices. Seasoning mixes without salt. Unsalted popcorn and pretzels. Fat-free sweets. What foods are not recommended? The items listed may not be a complete list. Talk with your dietitian about what dietary choices are best for you. Grains Baked goods made with fat, such as croissants, muffins, or some breads. Dry pasta or rice meal packs. Vegetables Creamed or fried vegetables. Vegetables in a cheese sauce. Regular canned vegetables (not low-sodium or reduced-sodium). Regular canned tomato sauce and paste (not low-sodium or reduced-sodium). Regular tomato and vegetable juice (not low-sodium or reduced-sodium). Angie Fava. Olives. Fruits Canned fruit in a light or heavy syrup. Fried fruit. Fruit in cream or butter sauce. Meat and other protein foods Fatty cuts of meat. Ribs. Fried meat. Berniece Salines. Sausage. Bologna and other processed lunch meats. Salami. Fatback. Hotdogs. Bratwurst. Salted nuts and seeds. Canned beans with added salt. Canned or smoked fish. Whole eggs or egg yolks. Chicken or [place] with skin. Dairy Whole or 2% milk, cream, and half-and-half. Whole or full-fat cream cheese. Whole-fat or sweetened yogurt.  Full-fat cheese. Nondairy creamers. Whipped toppings. Processed cheese and cheese spreads. Fats and oils Butter. Stick margarine. Lard. Shortening. Ghee. Bacon fat. Tropical oils, such as coconut, palm kernel, or palm oil. Seasoning and other foods Salted popcorn and pretzels. Onion salt, garlic salt, seasoned salt, table salt, and sea salt. Worcestershire sauce. Tartar sauce. Barbecue sauce. Teriyaki sauce. Soy sauce, including reduced-sodium. Steak sauce. Canned and packaged gravies. Fish sauce. Oyster sauce. Cocktail sauce. Horseradish that you find on the shelf. Ketchup. Mustard. Meat flavorings and tenderizers. Bouillon cubes. Hot sauce and Tabasco sauce. Premade or packaged marinades. Premade or packaged taco seasonings. Relishes. Regular salad dressings. Where to find more information:  National Heart, Lung, and [place]: https://wilson-eaton.com/  American Heart Association: www.heart.org Summary  The DASH eating plan is a healthy eating plan that has been shown to reduce high blood pressure (hypertension). It may also reduce your risk for type 2 diabetes, heart disease, and stroke.  With the DASH eating plan, you should limit salt (sodium) intake to 2,300 mg a day. If you have hypertension, you may need to reduce your sodium intake to 1,500 mg a day.  When on the DASH eating plan, aim to eat more fresh fruits and vegetables, whole grains, lean proteins, low-fat dairy, and heart-healthy fats.  Work with your health care provider or diet and nutrition  specialist (dietitian) to adjust your eating plan to your individual calorie needs. This information is not intended to replace advice given to you by your health care provider. Make sure you discuss any questions you have with your health care provider. Document Released: 07/17/2011 Document Revised: 07/21/2016 Document Reviewed: 07/21/2016 Elsevier Interactive Patient Education  [employer].

## 2017-10-13 NOTE — Progress Notes (Signed)
Subjective:  I acted as a Neurosurgeon for Dr. Abner Greenspan. [name], [place]  Patient ID: Nicole Ball, female    DOB: [birth year], 42 y.o.   MRN: [id number]  Chief Complaint  Patient presents with  . Follow-up    HPI  Patient is in today for a 3 month follow up and overall she is doing well. She is struggling with intermittent headaches, fatigue and myalgias. Denies CP/palp/SOB/congestion/fevers/GI or GU c/o. Taking meds as prescribed. No polyuria or polydipsia. She is considering returning to school again but right now she is stressed caring for her husband and working.   Patient Care Team: [name], MD as PCP - General (Family Medicine)   Past Medical History:  Diagnosis Date  . Abnormal serum level of alkaline phosphatase 05/21/2014  . Acute low back pain 12/19/2010  . ALLERGIC RHINITIS 10/16/2010  . Allergy    seasonal  . Anxiety and depression 06/07/2011  . Atrophic vaginitis 08/17/2016  . Cervical cancer screening 01/10/2014   Menarche at 8 Regular and moderate flow, became irregular until OCPs  history of abnormal pap in past, repeat was normal Last pap roughly 3 years ago G0P0, s/p No history of abnormal MGM, no h/o MGM  No concerns today no gyn surgeries LMP May 2013  . CTS (carpal tunnel syndrome) 01/15/2014  . Diabetes mellitus type 2 in obese (HCC) 10/30/2011  . Dry eyes 08/17/2016  . Early menopause   . Elevated BP 04/21/2011  . Essential hypertension   . HIATAL HERNIA WITH REFLUX, HX OF 10/16/2010  . Hyperlipidemia 04/21/2011  . Inappropriate sinus tachycardia   . INSOMNIA 10/16/2010  . Knee pain, left 03/11/2012  . Migraine 03/07/2013  . Obesity   . Palpitations 01/28/2015  . Reflux 04/22/2012  . Sleep apnea 06/07/2011  . Tobacco use disorder 02/10/2017  . Urinary urgency 08/17/2016  . UTI'S, HX OF 10/16/2010    Past Surgical History:  Procedure Laterality Date  . CHOLECYSTECTOMY      Family History  Problem Relation Age of Onset  . Lupus Mother   . Arthritis Maternal  Grandmother   . Scleroderma Maternal Grandmother   . Glaucoma Maternal Grandmother   . Cataracts Maternal Grandmother   . Cancer Maternal Grandfather        prostate  . Coronary artery disease Maternal Grandfather        s/p angioplasty  . Diabetes Maternal Grandfather        Type 2  . Cancer Paternal Grandmother        Breast ca- dx in 66's  . Thyroid disease Paternal Grandmother   . Breast cancer Paternal Grandmother   . Diabetes Paternal Grandfather   . Thyroid disease Sister   . Asthma Sister   . Allergies Brother   . Arthritis Other     Social History   Socioeconomic History  . Marital status: Married    Spouse name: Not on file  . Number of children: Not on file  . Years of education: Not on file  . Highest education level: Not on file  Social Needs  . Financial resource strain: Not on file  . Food insecurity - worry: Not on file  . Food insecurity - inability: Not on file  . Transportation needs - medical: Not on file  . Transportation needs - non-medical: Not on file  Occupational History  . Not on file  Tobacco Use  . Smoking status: Current Some Day Smoker    Packs/day: 0.10  Types: Cigars  . Smokeless tobacco: Never Used  . Tobacco comment: 1 black and milds  Substance and Sexual Activity  . Alcohol use: Yes    Comment: twice/year  . Drug use: No  . Sexual activity: Not on file  Other Topics Concern  . Not on file  Social History Narrative   Married lives w/ husband, works at [employer]    Outpatient Medications Prior to Visit  Medication Sig Dispense Refill  . ACETAMINOPHEN-BUTALBITAL 50-325 MG TABS Take 1 tablet by mouth 2 (two) times daily as needed (HEADACHES). 120 each 0  . amitriptyline (ELAVIL) 10 MG tablet Take 1 tablet (10 mg total) by mouth at bedtime. 60 tablet 2  . atorvastatin (LIPITOR) 10 MG tablet TAKE 1 TABLET(10 MG) BY MOUTH DAILY 90 tablet 0  . BAYER MICROLET LANCETS lancets Use as directed once daily to check blood sugar.    DX E11.9 100 each 1  . Blood Glucose Monitoring Suppl (CONTOUR BLOOD GLUCOSE SYSTEM) DEVI Use daily to check blood sugar. DX E11.9 1 Device 0  . busPIRone (BUSPAR) 10 MG tablet Take 0.5-1 tablets (5-10 mg total) by mouth 3 (three) times daily. 90 tablet 2  . CONTOUR NEXT TEST test strip USE TO CHECK BLOOD SUGAR DAILY 100 each 2  . cyclobenzaprine (FLEXERIL) 10 MG tablet TAKE 1 TABLET(10 MG) BY MOUTH TWICE DAILY AS NEEDED FOR MUSCLE SPASMS 60 tablet 0  . fluconazole (DIFLUCAN) 150 MG tablet Take 1 tablet (150 mg total) by mouth once a week. 3 tablet 0  . FLUoxetine (PROZAC) 40 MG capsule TAKE 1 CAPSULE(40 MG) BY MOUTH DAILY 90 capsule 0  . fluticasone (FLONASE) 50 MCG/ACT nasal spray Place 2 sprays into both nostrils daily. 16 g 6  . HYDROcodone-acetaminophen (NORCO/VICODIN) 5-325 MG tablet Take 1 tablet by mouth every 6 (six) hours as needed. Patient needs appointment for further refills. 100 tablet 0  . meloxicam (MOBIC) 15 MG tablet Take 1 tablet (15 mg total) by mouth daily as needed for pain. 90 tablet 1  . metFORMIN (GLUCOPHAGE) 500 MG tablet TAKE 2 TABLETS BY MOUTH TWICE DAILY 120 tablet 0  . montelukast (SINGULAIR) 10 MG tablet TAKE 1 TABLET(10 MG) BY MOUTH AT BEDTIME 30 tablet 2  . nystatin cream (MYCOSTATIN) Apply 1 application topically 2 (two) times daily. 30 g 2  . omeprazole (PRILOSEC) 20 MG capsule Take 1 capsule (20 mg total) by mouth 2 (two) times daily before a meal. 60 capsule 2  . promethazine (PHENERGAN) 25 MG tablet Take 1 tablet (25 mg total) by mouth every 8 (eight) hours as needed for nausea or vomiting. 20 tablet 0  . propranolol ER (INDERAL LA) 60 MG 24 hr capsule Take 1 capsule (60 mg total) by mouth daily. Patient needs to call and schedule an appointment for further refills 1st attempt 30 capsule 3  . ranitidine (ZANTAC) 300 MG capsule Take 1 capsule (300 mg total) by mouth every evening. 30 capsule 3  . Saline 0.65 % (SOLN) SOLN Place 2 Squirts into the nose 2 (two)  times daily. (Patient taking differently: Place 2 Squirts into the nose 3 (three) times a week. ) 1 Bottle 0  . SUMAtriptan (IMITREX) 50 MG tablet Take 1 tablet (50 mg total) every 2 (two) hours as needed by mouth for migraine. May repeat in 2 hours if headache persists or recurs. 10 tablet 3  . fluticasone (FLONASE) 50 MCG/ACT nasal spray SHAKE LIQUID AND USE 2 SPRAYS IN EACH NOSTRIL DAILY 16  g 3  . ranitidine (ZANTAC) 300 MG tablet Take 1 tablet (300 mg total) by mouth at bedtime. (Patient taking differently: Take 300 mg by mouth as needed. ) 30 tablet 6   No facility-administered medications prior to visit.     No Known Allergies  Review of Systems  Constitutional: Positive for malaise/fatigue. Negative for fever.  HENT: Negative for congestion.   Eyes: Negative for blurred vision.  Respiratory: Negative for shortness of breath.   Cardiovascular: Negative for chest pain, palpitations and leg swelling.  Gastrointestinal: Negative for abdominal pain, blood in stool and nausea.  Genitourinary: Negative for dysuria and frequency.  Musculoskeletal: Negative for falls.  Skin: Negative for rash.  Neurological: Positive for headaches. Negative for dizziness and loss of consciousness.  Endo/Heme/Allergies: Negative for environmental allergies.  Psychiatric/Behavioral: Negative for depression. The patient is not nervous/anxious.        Objective:    Physical Exam  Constitutional: She is oriented to person, place, and time. She appears well-developed and well-nourished. No distress.  HENT:  Head: Normocephalic and atraumatic.  Nose: Nose normal.  Eyes: Right eye exhibits no discharge. Left eye exhibits no discharge.  Neck: Normal range of motion. Neck supple.  Cardiovascular: Normal rate and regular rhythm.  No murmur heard. Pulmonary/Chest: Effort normal and breath sounds normal.  Abdominal: Soft. Bowel sounds are normal. There is no tenderness.  Musculoskeletal: She exhibits no  edema.  Neurological: She is alert and oriented to person, place, and time.  Skin: Skin is warm and dry.  Psychiatric: She has a normal mood and affect.  Nursing note and vitals reviewed.   BP 124/84   Pulse 75   Temp 97.9 F (36.6 C) (Oral)   Resp 18   Wt 196 lb 12.8 oz (89.3 kg)   LMP 01/08/2013   SpO2 99%   BMI 38.43 kg/m  Wt Readings from Last 3 Encounters:  10/13/17 196 lb 12.8 oz (89.3 kg)  07/14/17 203 lb 9.6 oz (92.4 kg)  12/09/16 209 lb (94.8 kg)   BP Readings from Last 3 Encounters:  10/14/17 124/84  07/14/17 128/86  12/09/16 118/80     Immunization History  Administered Date(s) Administered  . Influenza Whole 04/18/2011  . Influenza,inj,Quad PF,6+ Mos 05/20/2013, 05/19/2014, 07/03/2015, 08/15/2016  . Pneumococcal Conjugate-13 12/28/2015  . Tdap 10/14/2013    Health Maintenance  Topic Date Due  . PNEUMOCOCCAL POLYSACCHARIDE VACCINE (1) 05/31/1976  . URINE MICROALBUMIN  09/16/2016  . FOOT EXAM  12/27/2016  . OPHTHALMOLOGY EXAM  02/08/2017  . INFLUENZA VACCINE  03/11/2017  . HEMOGLOBIN A1C  10/12/2017  . PAP SMEAR  07/14/2020  . TETANUS/TDAP  10/15/2023  . HIV Screening  Completed    Lab Results  Component Value Date   WBC 7.9 10/13/2017   HGB 13.2 10/13/2017   HCT 40.8 10/13/2017   PLT 214.0 10/13/2017   GLUCOSE 91 10/13/2017   CHOL 132 10/13/2017   TRIG 72.0 10/13/2017   HDL 74.10 10/13/2017   LDLCALC 43 10/13/2017   ALT 23 10/13/2017   AST 14 10/13/2017   NA 140 10/13/2017   K 3.5 10/13/2017   CL 103 10/13/2017   CREATININE 0.72 10/13/2017   BUN 10 10/13/2017   CO2 25 10/13/2017   TSH 0.86 10/13/2017   HGBA1C 7.7 (H) 10/13/2017   MICROALBUR <0.7 09/17/2015    Lab Results  Component Value Date   TSH 0.86 10/13/2017   Lab Results  Component Value Date   WBC 7.9 10/13/2017  HGB 13.2 10/13/2017   HCT 40.8 10/13/2017   MCV 88.8 10/13/2017   PLT 214.0 10/13/2017   Lab Results  Component Value Date   NA 140 10/13/2017   K 3.5  10/13/2017   CO2 25 10/13/2017   GLUCOSE 91 10/13/2017   BUN 10 10/13/2017   CREATININE 0.72 10/13/2017   BILITOT 0.2 10/13/2017   ALKPHOS 134 (H) 10/13/2017   AST 14 10/13/2017   ALT 23 10/13/2017   PROT 8.6 (H) 10/13/2017   ALBUMIN 4.6 10/13/2017   CALCIUM 11.0 (H) 10/13/2017   GFR 113.50 10/13/2017   Lab Results  Component Value Date   CHOL 132 10/13/2017   Lab Results  Component Value Date   HDL 74.10 10/13/2017   Lab Results  Component Value Date   LDLCALC 43 10/13/2017   Lab Results  Component Value Date   TRIG 72.0 10/13/2017   Lab Results  Component Value Date   CHOLHDL 2 10/13/2017   Lab Results  Component Value Date   HGBA1C 7.7 (H) 10/13/2017         Assessment & Plan:   Problem List Items Addressed This Visit    Hyperlipidemia    Encouraged heart healthy diet, increase exercise, avoid trans fats, consider a krill oil cap daily      Diabetes mellitus type 2 in obese (HCC)    hgba1c acceptable, minimize simple carbs. Increase exercise as tolerated. Continue current meds      Migraine    Encouraged increased hydration, 64 ounces of clear fluids daily. Minimize alcohol and caffeine. Eat small frequent meals with lean proteins and complex carbs. Avoid high and low blood sugars. Get adequate sleep, 7-8 hours a night. Needs exercise daily preferably in the morning.      HTN (hypertension)    Well controlled, no changes to meds. Encouraged heart healthy diet such as the DASH diet and exercise as tolerated.       Headache    Encouraged increased hydration, 64 ounces of clear fluids daily. Minimize alcohol and caffeine. Eat small frequent meals with lean proteins and complex carbs. Avoid high and low blood sugars. Get adequate sleep, 7-8 hours a night. Needs exercise daily preferably in the morning.      Diarrhea    Add a fiber supplement once to three times        Other Visit Diagnoses    High risk medication use          I am having  Lynnzie Z. Lia start on conjugated estrogens. I am also having her maintain her Saline, BAYER MICROLET LANCETS, fluticasone, meloxicam, busPIRone, CONTOUR NEXT TEST, nystatin cream, ACETAMINOPHEN-BUTALBITAL, amitriptyline, ranitidine, SUMAtriptan, cyclobenzaprine, atorvastatin, FLUoxetine, propranolol ER, CONTOUR BLOOD GLUCOSE SYSTEM, omeprazole, promethazine, fluconazole, HYDROcodone-acetaminophen, montelukast, and metFORMIN.  Meds ordered this encounter  Medications  . conjugated estrogens (PREMARIN) vaginal cream    Sig: Place 1 Applicatorful vaginally once a week.    Dispense:  42.5 g    Refill:  121    CMA served as scribe during this visit. History, Physical and Plan performed by medical provider. Documentation and orders reviewed and attested to.  Danise Edge, MD

## 2017-10-14 LAB — CBC
HEMATOCRIT: 40.8 % (ref 36.0–46.0)
HEMOGLOBIN: 13.2 g/dL (ref 12.0–15.0)
MCHC: 32.4 g/dL (ref 30.0–36.0)
MCV: 88.8 fl (ref 78.0–100.0)
PLATELETS: 214 10*3/uL (ref 150.0–400.0)
RBC: 4.59 Mil/uL (ref 3.87–5.11)
RDW: 16.4 % — ABNORMAL HIGH (ref 11.5–15.5)
WBC: 7.9 10*3/uL (ref 4.0–10.5)

## 2017-10-14 LAB — HEMOGLOBIN A1C: Hgb A1c MFr Bld: 7.7 % — ABNORMAL HIGH (ref 4.6–6.5)

## 2017-10-14 LAB — COMPREHENSIVE METABOLIC PANEL
ALT: 23 U/L (ref 0–35)
AST: 14 U/L (ref 0–37)
Albumin: 4.6 g/dL (ref 3.5–5.2)
Alkaline Phosphatase: 134 U/L — ABNORMAL HIGH (ref 39–117)
BUN: 10 mg/dL (ref 6–23)
CHLORIDE: 103 meq/L (ref 96–112)
CO2: 25 mEq/L (ref 19–32)
Calcium: 11 mg/dL — ABNORMAL HIGH (ref 8.4–10.5)
Creatinine, Ser: 0.72 mg/dL (ref 0.40–1.20)
GFR: 113.5 mL/min (ref >60.00)
GLUCOSE: 91 mg/dL (ref 70–99)
POTASSIUM: 3.5 meq/L (ref 3.5–5.1)
SODIUM: 140 meq/L (ref 135–145)
Total Bilirubin: 0.2 mg/dL (ref 0.2–1.2)
Total Protein: 8.6 g/dL — ABNORMAL HIGH (ref 6.0–8.3)

## 2017-10-14 LAB — LIPID PANEL
Cholesterol: 132 mg/dL (ref 0–200)
HDL: 74.1 mg/dL (ref >39.00)
LDL CALC: 43 mg/dL (ref 0–99)
NONHDL: 57.41
Total CHOL/HDL Ratio: 2
Triglycerides: 72 mg/dL (ref 0.0–149.0)
VLDL: 14.4 mg/dL (ref 0.0–40.0)

## 2017-10-14 LAB — TSH: TSH: 0.86 u[IU]/mL (ref 0.35–4.50)

## 2017-10-14 NOTE — Assessment & Plan Note (Signed)
Encouraged increased hydration, 64 ounces of clear fluids daily. Minimize alcohol and caffeine. Eat small frequent meals with lean proteins and complex carbs. Avoid high and low blood sugars. Get adequate sleep, 7-8 hours a night. Needs exercise daily preferably in the morning.  

## 2017-10-15 ENCOUNTER — Telehealth: Payer: Self-pay | Admitting: *Deleted

## 2017-10-15 NOTE — Telephone Encounter (Signed)
Opened in Error; disability care is for her spouse/SLS 03/07

## 2017-10-16 ENCOUNTER — Other Ambulatory Visit: Payer: Self-pay

## 2017-10-18 LAB — PAIN MGMT, PROFILE 8 W/CONF, U
6 ACETYLMORPHINE: NEGATIVE ng/mL (ref <10)
ALPHAHYDROXYALPRAZOLAM: 29 ng/mL — AB (ref <25)
ALPHAHYDROXYMIDAZOLAM: NEGATIVE ng/mL (ref <50)
AMINOCLONAZEPAM: NEGATIVE ng/mL (ref <25)
Alcohol Metabolites: NEGATIVE ng/mL (ref <500)
Alphahydroxytriazolam: NEGATIVE ng/mL (ref <50)
Amphetamines: NEGATIVE ng/mL (ref <500)
Benzodiazepines: POSITIVE ng/mL — AB (ref <100)
Buprenorphine, Urine: NEGATIVE ng/mL (ref <5)
Cocaine Metabolite: NEGATIVE ng/mL (ref <150)
Codeine: NEGATIVE ng/mL (ref <50)
Creatinine: 165.7 mg/dL
HYDROMORPHONE: NEGATIVE ng/mL (ref <50)
HYDROXYETHYLFLURAZEPAM: NEGATIVE ng/mL (ref <50)
Hydrocodone: 413 ng/mL — ABNORMAL HIGH (ref <50)
LORAZEPAM: NEGATIVE ng/mL (ref <50)
MARIJUANA METABOLITE: NEGATIVE ng/mL (ref <20)
MDMA: NEGATIVE ng/mL (ref <500)
Morphine: NEGATIVE ng/mL (ref <50)
Nordiazepam: NEGATIVE ng/mL (ref <50)
Norhydrocodone: 800 ng/mL — ABNORMAL HIGH (ref <50)
OXYCODONE: NEGATIVE ng/mL (ref <100)
Opiates: POSITIVE ng/mL — AB (ref <100)
Oxazepam: NEGATIVE ng/mL (ref <50)
Oxidant: NEGATIVE ug/mL (ref <200)
TEMAZEPAM: NEGATIVE ng/mL (ref <50)
pH: 6.13 (ref 4.5–9.0)

## 2017-10-23 ENCOUNTER — Other Ambulatory Visit: Payer: 59

## 2017-10-27 ENCOUNTER — Encounter: Payer: Self-pay | Admitting: Family Medicine

## 2017-10-27 ENCOUNTER — Other Ambulatory Visit (INDEPENDENT_AMBULATORY_CARE_PROVIDER_SITE_OTHER): Payer: 59

## 2017-10-29 LAB — PTH, INTACT AND CALCIUM
Calcium: 10.3 mg/dL — ABNORMAL HIGH (ref 8.6–10.2)
PTH: 45 pg/mL (ref 14–64)

## 2017-11-09 ENCOUNTER — Other Ambulatory Visit: Payer: Self-pay | Admitting: Family Medicine

## 2017-11-20 ENCOUNTER — Ambulatory Visit: Payer: 59 | Admitting: Cardiology

## 2017-11-20 ENCOUNTER — Other Ambulatory Visit: Payer: Self-pay | Admitting: Family Medicine

## 2017-11-20 DIAGNOSIS — M25562 Pain in left knee: Secondary | ICD-10-CM

## 2017-11-20 MED ORDER — HYDROCODONE-ACETAMINOPHEN 5-325 MG PO TABS: 1.0000 | ORAL_TABLET | Freq: Four times a day (QID) | ORAL | 0 refills | Status: DC | PRN

## 2017-11-20 NOTE — Telephone Encounter (Signed)
LOV: 10/13/17 Next OV: 01/14/18  [facility]lyth  [facility]

## 2017-11-20 NOTE — Telephone Encounter (Signed)
Copied from CRM [phone]. Topic: Quick Communication - Rx Refill/Question >> Nov 20, 2017  1:44 PM Oneal GroutSebastian, Jennifer S wrote: Medication: HYDROcodone-acetaminophen (NORCO/VICODIN) 5-325 MG tablet  Has the patient contacted their pharmacy? Yes.   (Agent: If no, request that the patient contact the pharmacy for the refill.) Preferred Pharmacy (with phone number or street name): [facility] Agent: Please be advised that RX refills may take up to 3 business days. We ask that you follow-up with your pharmacy.

## 2017-11-20 NOTE — Telephone Encounter (Signed)
Last hydrocodone RX: 10/06/17, #100 Last OV: 10/13/17 Next OV: 01/14/18 UDS: 10/13/17 CSC: 08/15/16, low risk CSR: No discrepancies identified

## 2017-12-11 ENCOUNTER — Other Ambulatory Visit: Payer: Self-pay | Admitting: Family Medicine

## 2017-12-11 DIAGNOSIS — E785 Hyperlipidemia, unspecified: Secondary | ICD-10-CM

## 2017-12-11 DIAGNOSIS — F419 Anxiety disorder, unspecified: Secondary | ICD-10-CM

## 2017-12-11 DIAGNOSIS — R002 Palpitations: Secondary | ICD-10-CM

## 2017-12-11 DIAGNOSIS — I10 Essential (primary) hypertension: Secondary | ICD-10-CM

## 2017-12-14 ENCOUNTER — Other Ambulatory Visit: Payer: Self-pay | Admitting: Family Medicine

## 2017-12-14 DIAGNOSIS — M549 Dorsalgia, unspecified: Secondary | ICD-10-CM

## 2017-12-16 ENCOUNTER — Other Ambulatory Visit: Payer: Self-pay

## 2017-12-16 DIAGNOSIS — I10 Essential (primary) hypertension: Secondary | ICD-10-CM

## 2017-12-16 DIAGNOSIS — R002 Palpitations: Secondary | ICD-10-CM

## 2017-12-16 DIAGNOSIS — E785 Hyperlipidemia, unspecified: Secondary | ICD-10-CM

## 2017-12-16 DIAGNOSIS — F419 Anxiety disorder, unspecified: Secondary | ICD-10-CM

## 2017-12-16 MED ORDER — ATORVASTATIN CALCIUM 10 MG PO TABS: ORAL_TABLET | ORAL | 0 refills | Status: DC

## 2017-12-16 MED ORDER — FLUOXETINE HCL 40 MG PO CAPS: ORAL_CAPSULE | ORAL | 0 refills | Status: DC

## 2017-12-29 ENCOUNTER — Ambulatory Visit: Payer: 59 | Admitting: Cardiology

## 2017-12-30 ENCOUNTER — Other Ambulatory Visit: Payer: Self-pay | Admitting: Family Medicine

## 2017-12-30 ENCOUNTER — Ambulatory Visit: Payer: 59 | Admitting: Cardiology

## 2017-12-30 DIAGNOSIS — M25562 Pain in left knee: Secondary | ICD-10-CM

## 2017-12-30 NOTE — Telephone Encounter (Signed)
Copied from CRM [id number]. Topic: Quick Communication - Rx Refill/Question >> Dec 30, 2017  2:08 PM Diana Eves B wrote: Medication: HYDROcodone-acetaminophen (NORCO/VICODIN) 5-325 MG tablet  Has the patient contacted their pharmacy? Yes.   (Agent: If no, request that the patient contact the pharmacy for the refill.) (Agent: If yes, when and what did the pharmacy advise?)  Preferred Pharmacy (with phone number or street name): [id number] - [place], [place] - [address] AT [facility] OF [address]  Agent: Please be advised that RX refills may take up to 3 business days. We ask that you follow-up with your pharmacy.

## 2017-12-31 ENCOUNTER — Other Ambulatory Visit: Payer: Self-pay | Admitting: Family Medicine

## 2017-12-31 DIAGNOSIS — M25562 Pain in left knee: Secondary | ICD-10-CM

## 2017-12-31 MED ORDER — HYDROCODONE-ACETAMINOPHEN 5-325 MG PO TABS: 1.0000 | ORAL_TABLET | Freq: Four times a day (QID) | ORAL | 0 refills | Status: DC | PRN

## 2017-12-31 NOTE — Telephone Encounter (Signed)
Requesting:NORCO/VICODIN 5-325 MG Contract:08/15/16 UDS:10/13/17 Last OV: 10/13/17 Next OV: 01/14/18 Last Refill: 11/20/17   Please advise

## 2018-01-01 MED ORDER — PROMETHAZINE HCL 25 MG PO TABS: 25.0000 mg | ORAL_TABLET | Freq: Three times a day (TID) | ORAL | 0 refills | Status: DC | PRN

## 2018-01-01 NOTE — Telephone Encounter (Signed)
Requesting:hydrocodoene-acetaminophen Contract:none UDS:10/13/17 Last Visit:10/13/17 Next Visit:10/13/17 Last Refill:YESTERDAY   Refill responded to by other means.   Please Advise

## 2018-01-11 ENCOUNTER — Other Ambulatory Visit: Payer: Self-pay | Admitting: Family Medicine

## 2018-01-11 MED ORDER — FLUCONAZOLE 150 MG PO TABS: 150.0000 mg | ORAL_TABLET | ORAL | 0 refills | Status: DC

## 2018-01-12 ENCOUNTER — Encounter: Payer: Self-pay | Admitting: Cardiology

## 2018-01-12 ENCOUNTER — Other Ambulatory Visit: Payer: Self-pay | Admitting: Family Medicine

## 2018-01-12 ENCOUNTER — Ambulatory Visit: Payer: 59 | Admitting: Cardiology

## 2018-01-12 VITALS — BP 136/104 | HR 87 | Ht 63.5 in | Wt 192.6 lb

## 2018-01-12 DIAGNOSIS — I10 Essential (primary) hypertension: Secondary | ICD-10-CM

## 2018-01-12 DIAGNOSIS — E669 Obesity, unspecified: Secondary | ICD-10-CM | POA: Diagnosis not present

## 2018-01-12 DIAGNOSIS — G4733 Obstructive sleep apnea (adult) (pediatric): Secondary | ICD-10-CM

## 2018-01-12 DIAGNOSIS — R002 Palpitations: Secondary | ICD-10-CM | POA: Diagnosis not present

## 2018-01-12 DIAGNOSIS — F419 Anxiety disorder, unspecified: Secondary | ICD-10-CM

## 2018-01-12 NOTE — Patient Instructions (Signed)
Medication Instructions:  Your physician recommends that you continue on your current medications as directed. Please refer to the Current Medication list given to you today.  If you need a refill on your cardiac medications, please contact your pharmacy first.  Labwork: None ordered   Testing/Procedures: Your physician has recommended that you wear a holter monitor. Holter monitors are medical devices that record the heart's electrical activity. Doctors most often use these monitors to diagnose arrhythmias. Arrhythmias are problems with the speed or rhythm of the heartbeat. The monitor is a small, portable device. You can wear one while you do your normal daily activities. This is usually used to diagnose what is causing palpitations/syncope (passing out).   Follow-Up: Your physician wants you to follow-up in: 1 year with Dr. Mayford Knifeurner. You will receive a reminder letter in the mail two months in advance. If you don't receive a letter, please call our office to schedule the follow-up appointment.  Any Other Special Instructions Will Be Listed Below (If Applicable). You have been set up with [facility]. If you do not hear from them within the next week call Veda CanningNina Jones, CPAP assistant at [phone]  [facility]    Lyda PeroneRena Antia Rahal, RN  [phone]  If you need a refill on your cardiac medications before your next appointment, please call your pharmacy.

## 2018-01-12 NOTE — Progress Notes (Signed)
Cardiology Office Note:    Date:  01/12/2018   ID:  Nicole Ball, DOB [birth year], MRN [id number]  PCP:  Bradd CanaryBlyth, Stacey A, MD  Cardiologist:  No primary care provider on file.    Referring MD: Bradd CanaryBlyth, Stacey A, MD   Chief Complaint  Patient presents with  . Sleep Apnea    History of Present Illness:    Nicole Ball is a 42 y.o. female with a hx of OSA on CPAP.  She was referred to me for further evaluation by Lucile Craterana Dunn, PA.  She is she was diagnosed with sleep apnea about 4 to 5 years ago and has been on CPAP therapy since then.  She uses a ResMed air fit nasal pillow mask which she tolerates well.  She does not have any problems with her CPAP device.  The patient has not been followed by a sleep medicine doctor and is needing supplies.  She tolerates the mask and feels the pressure is adequate.  Since going on CPAP she continues to  feel rested in the am and has no significant daytime sleepiness.  She denies any significant mouth or nasal dryness or nasal congestion.  She does not think that he snores.  She also says that she has been having some problems with her heart racing recently has been under a lot of stress at home.  Her father recently died that has caused a great deal of stress.   Past Medical History:  Diagnosis Date  . Abnormal serum level of alkaline phosphatase 05/21/2014  . Acute low back pain 12/19/2010  . ALLERGIC RHINITIS 10/16/2010  . Allergy    seasonal  . Anxiety and depression 06/07/2011  . Atrophic vaginitis 08/17/2016  . Cervical cancer screening 01/10/2014   Menarche at 8 Regular and moderate flow, became irregular until OCPs  history of abnormal pap in past, repeat was normal Last pap roughly 3 years ago G0P0, s/p No history of abnormal MGM, no h/o MGM  No concerns today no gyn surgeries LMP May 2013  . CTS (carpal tunnel syndrome) 01/15/2014  . Diabetes mellitus type 2 in obese (HCC) 10/30/2011  . Dry eyes 08/17/2016  . Early menopause   . Elevated BP  04/21/2011  . Essential hypertension   . HIATAL HERNIA WITH REFLUX, HX OF 10/16/2010  . Hyperlipidemia 04/21/2011  . Inappropriate sinus tachycardia   . INSOMNIA 10/16/2010  . Knee pain, left 03/11/2012  . Migraine 03/07/2013  . Obesity   . Obesity (BMI 30-39.9) 01/12/2018  . Palpitations 01/28/2015  . Reflux 04/22/2012  . Sleep apnea 06/07/2011  . Tobacco use disorder 02/10/2017  . Urinary urgency 08/17/2016  . UTI'S, HX OF 10/16/2010    Past Surgical History:  Procedure Laterality Date  . CHOLECYSTECTOMY      Current Medications: Current Meds  Medication Sig  . ACETAMINOPHEN-BUTALBITAL 50-325 MG TABS Take 1 tablet by mouth 2 (two) times daily as needed (HEADACHES).  Marland Kitchen. amitriptyline (ELAVIL) 10 MG tablet Take 1 tablet (10 mg total) by mouth at bedtime.  Marland Kitchen. atorvastatin (LIPITOR) 10 MG tablet TAKE 1 TABLET(10 MG) BY MOUTH DAILY  . BAYER MICROLET LANCETS lancets Use as directed once daily to check blood sugar.   DX E11.9  . Blood Glucose Monitoring Suppl (CONTOUR BLOOD GLUCOSE SYSTEM) DEVI Use daily to check blood sugar. DX E11.9  . busPIRone (BUSPAR) 10 MG tablet Take 0.5-1 tablets (5-10 mg total) by mouth 3 (three) times daily.  Marland Kitchen. conjugated estrogens (PREMARIN) vaginal cream Place  1 Applicatorful vaginally once a week.  . CONTOUR NEXT TEST test strip USE TO CHECK BLOOD SUGAR DAILY  . cyclobenzaprine (FLEXERIL) 10 MG tablet TAKE 1 TABLET(10 MG) BY MOUTH TWICE DAILY AS NEEDED FOR MUSCLE SPASMS  . fluconazole (DIFLUCAN) 150 MG tablet Take 1 tablet (150 mg total) by mouth once a week.  Marland Kitchen FLUoxetine (PROZAC) 40 MG capsule TAKE 1 CAPSULE(40 MG) BY MOUTH DAILY  . fluticasone (FLONASE) 50 MCG/ACT nasal spray Place 2 sprays into both nostrils daily.  Marland Kitchen HYDROcodone-acetaminophen (NORCO/VICODIN) 5-325 MG tablet Take 1 tablet by mouth every 6 (six) hours as needed. Patient needs appointment for further refills.  . meloxicam (MOBIC) 15 MG tablet Take 1 tablet (15 mg total) by mouth daily as needed for pain.    . metFORMIN (GLUCOPHAGE) 500 MG tablet TAKE 2 TABLETS BY MOUTH TWICE DAILY  . montelukast (SINGULAIR) 10 MG tablet TAKE 1 TABLET(10 MG) BY MOUTH AT BEDTIME  . nystatin cream (MYCOSTATIN) Apply 1 application topically 2 (two) times daily.  Marland Kitchen omeprazole (PRILOSEC) 20 MG capsule Take 1 capsule (20 mg total) by mouth 2 (two) times daily before a meal.  . promethazine (PHENERGAN) 25 MG tablet Take 1 tablet (25 mg total) by mouth every 8 (eight) hours as needed for nausea or vomiting.  . propranolol ER (INDERAL LA) 60 MG 24 hr capsule Take 1 capsule (60 mg total) by mouth daily. Patient needs to call and schedule an appointment for further refills 1st attempt  . ranitidine (ZANTAC) 300 MG capsule Take 1 capsule (300 mg total) by mouth every evening.  . Saline 0.65 % (SOLN) SOLN Place 2 Squirts into the nose 2 (two) times daily. (Patient taking differently: Place 2 Squirts into the nose 3 (three) times a week. )  . SUMAtriptan (IMITREX) 50 MG tablet Take 1 tablet (50 mg total) every 2 (two) hours as needed by mouth for migraine. May repeat in 2 hours if headache persists or recurs.     Allergies:   Patient has no known allergies.   Social History   Socioeconomic History  . Marital status: Married    Spouse name: Not on file  . Number of children: Not on file  . Years of education: Not on file  . Highest education level: Not on file  Occupational History  . Not on file  Social Needs  . Financial resource strain: Not on file  . Food insecurity:    Worry: Not on file    Inability: Not on file  . Transportation needs:    Medical: Not on file    Non-medical: Not on file  Tobacco Use  . Smoking status: Current Some Day Smoker    Packs/day: 0.10    Types: Cigars  . Smokeless tobacco: Never Used  . Tobacco comment: 1 black and milds  Substance and Sexual Activity  . Alcohol use: Yes    Comment: twice/year  . Drug use: No  . Sexual activity: Not on file  Lifestyle  . Physical activity:     Days per week: Not on file    Minutes per session: Not on file  . Stress: Not on file  Relationships  . Social connections:    Talks on phone: Not on file    Gets together: Not on file    Attends religious service: Not on file    Active member of club or organization: Not on file    Attends meetings of clubs or organizations: Not on file  Relationship status: Not on file  Other Topics Concern  . Not on file  Social History Narrative   Married lives w/ husband, works at [employer] History: The patient's family history includes Allergies in her brother; Arthritis in her maternal grandmother and other; Asthma in her sister; Breast cancer in her paternal grandmother; Cancer in her maternal grandfather and paternal grandmother; Cataracts in her maternal grandmother; Coronary artery disease in her maternal grandfather; Diabetes in her maternal grandfather and paternal grandfather; Glaucoma in her maternal grandmother; Lupus in her mother; Scleroderma in her maternal grandmother; Thyroid disease in her paternal grandmother and sister.  ROS:   Please see the history of present illness.    ROS  All other systems reviewed and negative.   EKGs/Labs/Other Studies Reviewed:    The following studies were reviewed today: PAP download  EKG:  EKG is not ordered today.  The ekg ordered today demonstrates sinus tachycardia 101 bpm with no ST-T wave at normalities.  Recent Labs: 10/13/2017: ALT 23; BUN 10; Creatinine, Ser 0.72; Hemoglobin 13.2; Platelets 214.0; Potassium 3.5; Sodium 140; TSH 0.86   Recent Lipid Panel    Component Value Date/Time   CHOL 132 10/13/2017 1753   TRIG 72.0 10/13/2017 1753   HDL 74.10 10/13/2017 1753   CHOLHDL 2 10/13/2017 1753   VLDL 14.4 10/13/2017 1753   LDLCALC 43 10/13/2017 1753    Physical Exam:    VS:  BP (!) 136/104   Pulse 87   Ht 5' 3.5" (1.613 m)   Wt 192 lb 9.6 oz (87.4 kg)   LMP 01/08/2013   SpO2 97%   BMI 33.58 kg/m      Wt Readings from Last 3 Encounters:  01/12/18 192 lb 9.6 oz (87.4 kg)  10/13/17 196 lb 12.8 oz (89.3 kg)  07/14/17 203 lb 9.6 oz (92.4 kg)     GEN:  Well nourished, well developed in no acute distress HEENT: Normal NECK: No JVD; No carotid bruits LYMPHATICS: No lymphadenopathy CARDIAC: RRR, no murmurs, rubs, gallops RESPIRATORY:  Clear to auscultation without rales, wheezing or rhonchi  ABDOMEN: Soft, non-tender, non-distended MUSCULOSKELETAL:  No edema; No deformity  SKIN: Warm and dry NEUROLOGIC:  Alert and oriented x 3 PSYCHIATRIC:  Normal affect   ASSESSMENT:    1. OSA (obstructive sleep apnea)   2. Essential hypertension   3. Obesity (BMI 30-39.9)   4. Heart palpitations    PLAN:    In order of problems listed above:  1.  OSA - the patient is tolerating PAP therapy well without any problems.   The patient has been using and benefiting from PAP use and will continue to benefit from therapy.  She would like to get a new DME so I will set her up with [facility] and get a download.  I will also order new supplies as well as a new ResMed air fit P 30 mask with chinstrap.  2.  HTN - BP is elevated on exam today.  She has not taken her medications yet today.  She will continue on Inderal LA 60 mg daily.  3.  Obesity - I have encouraged her to get into a routine exercise program and cut back on carbs and portions.   4.  Palpitations -she has been under a lot of stress recently with the illness of her father who recently died.  She says she is also having some other life-changing events at home as well.  She would like  to have an antianxiety drug and I recommend that she talk with her PCP.  EKG today showed I will get a 24-hour Holter monitor to assess average heart rate.   Medication Adjustments/Labs and Tests Ordered: Current medicines are reviewed at length with the patient today.  Concerns regarding medicines are outlined above.  No orders of the defined types were  placed in this encounter.  No orders of the defined types were placed in this encounter.   Signed, Armanda Magic, MD  01/12/2018 11:32 AM    [facility]

## 2018-01-13 ENCOUNTER — Telehealth: Payer: Self-pay | Admitting: *Deleted

## 2018-01-13 ENCOUNTER — Other Ambulatory Visit: Payer: Self-pay | Admitting: Family Medicine

## 2018-01-13 ENCOUNTER — Ambulatory Visit (INDEPENDENT_AMBULATORY_CARE_PROVIDER_SITE_OTHER): Payer: 59

## 2018-01-13 DIAGNOSIS — E785 Hyperlipidemia, unspecified: Secondary | ICD-10-CM

## 2018-01-13 DIAGNOSIS — R002 Palpitations: Secondary | ICD-10-CM

## 2018-01-13 DIAGNOSIS — I10 Essential (primary) hypertension: Secondary | ICD-10-CM

## 2018-01-13 DIAGNOSIS — G4733 Obstructive sleep apnea (adult) (pediatric): Secondary | ICD-10-CM

## 2018-01-13 NOTE — Telephone Encounter (Signed)
Faxed to [facility]

## 2018-01-13 NOTE — Telephone Encounter (Signed)
-----   Message from Quintella Reichertraci R Turner, MD sent at 01/13/2018 12:32 PM EDT ----- Regarding: RE: CPAP Airsense CPAP with heated humidity from 5-20cm H2O and get a download 2 weeks after and followup with me in 10 weeks  Traci TUrner ----- Message ----- From: Reesa ChewJones, Krina Mraz G, CMA Sent: 01/13/2018  10:04 AM To: Quintella Reichertraci R Turner, MD Subject: RE: CPAP                                       YES, per her last dme her settings were on auto 5-20. Thanks ----- Message ----- From: Quintella Reicherturner, Traci R, MD Sent: 01/13/2018   9:10 AM To: Reesa Cheworothea G Roseanne Juenger, CMA Subject: RE: CPAP                                       Does she want a  New device  Traci ----- Message ----- From: Reesa ChewJones, Amirah Goerke G, CMA Sent: 01/12/2018   1:41 PM To: Quintella Reichertraci R Turner, MD Subject: CPAP                                           Hello Dr Mayford Knifeurner, Patient had a sleep study done in 2012 you can view it under procedures 05/07/11 and was recommended to have a cpap at that time. Thanks, Coralee NorthNina

## 2018-01-14 ENCOUNTER — Encounter: Payer: Self-pay | Admitting: Family Medicine

## 2018-01-14 ENCOUNTER — Other Ambulatory Visit (HOSPITAL_COMMUNITY)
Admission: RE | Admit: 2018-01-14 | Discharge: 2018-01-14 | Disposition: A | Discharge status: Home / Self Care | Payer: 59 | Source: Ambulatory Visit | Attending: Family Medicine | Admitting: Family Medicine

## 2018-01-14 ENCOUNTER — Ambulatory Visit (INDEPENDENT_AMBULATORY_CARE_PROVIDER_SITE_OTHER): Payer: 59 | Admitting: Family Medicine

## 2018-01-14 VITALS — BP 126/72 | HR 90 | Temp 98.0°F | Resp 18 | Wt 192.8 lb

## 2018-01-14 DIAGNOSIS — M791 Myalgia, unspecified site: Secondary | ICD-10-CM | POA: Diagnosis not present

## 2018-01-14 DIAGNOSIS — N761 Subacute and chronic vaginitis: Secondary | ICD-10-CM | POA: Diagnosis not present

## 2018-01-14 DIAGNOSIS — F419 Anxiety disorder, unspecified: Secondary | ICD-10-CM

## 2018-01-14 DIAGNOSIS — E1169 Type 2 diabetes mellitus with other specified complication: Secondary | ICD-10-CM | POA: Diagnosis not present

## 2018-01-14 DIAGNOSIS — E669 Obesity, unspecified: Secondary | ICD-10-CM

## 2018-01-14 DIAGNOSIS — F32A Depression, unspecified: Secondary | ICD-10-CM

## 2018-01-14 DIAGNOSIS — F329 Major depressive disorder, single episode, unspecified: Secondary | ICD-10-CM

## 2018-01-14 DIAGNOSIS — I10 Essential (primary) hypertension: Secondary | ICD-10-CM

## 2018-01-14 DIAGNOSIS — E782 Mixed hyperlipidemia: Secondary | ICD-10-CM

## 2018-01-14 LAB — CBC
HEMATOCRIT: 41 % (ref 36.0–46.0)
Hemoglobin: 13.2 g/dL (ref 12.0–15.0)
MCHC: 32.2 g/dL (ref 30.0–36.0)
MCV: 88.8 fl (ref 78.0–100.0)
Platelets: 200 10*3/uL (ref 150.0–400.0)
RBC: 4.62 Mil/uL (ref 3.87–5.11)
RDW: 15.2 % (ref 11.5–15.5)
WBC: 7.2 10*3/uL (ref 4.0–10.5)

## 2018-01-14 LAB — TSH: TSH: 1.29 u[IU]/mL (ref 0.35–4.50)

## 2018-01-14 LAB — COMPREHENSIVE METABOLIC PANEL
ALBUMIN: 4.3 g/dL (ref 3.5–5.2)
ALK PHOS: 143 U/L — AB (ref 39–117)
ALT: 16 U/L (ref 0–35)
AST: 11 U/L (ref 0–37)
BILIRUBIN TOTAL: 0.2 mg/dL (ref 0.2–1.2)
BUN: 11 mg/dL (ref 6–23)
CO2: 22 mEq/L (ref 19–32)
CREATININE: 0.68 mg/dL (ref 0.40–1.20)
Calcium: 10.1 mg/dL (ref 8.4–10.5)
Chloride: 102 mEq/L (ref 96–112)
GFR: 121.09 mL/min (ref >60.00)
GLUCOSE: 93 mg/dL (ref 70–99)
POTASSIUM: 3.9 meq/L (ref 3.5–5.1)
SODIUM: 137 meq/L (ref 135–145)
Total Protein: 7.4 g/dL (ref 6.0–8.3)

## 2018-01-14 LAB — LIPID PANEL
CHOL/HDL RATIO: 2
Cholesterol: 159 mg/dL (ref 0–200)
HDL: 67.6 mg/dL (ref >39.00)
LDL Cholesterol: 61 mg/dL (ref 0–99)
NONHDL: 91.58
Triglycerides: 155 mg/dL — ABNORMAL HIGH (ref 0.0–149.0)
VLDL: 31 mg/dL (ref 0.0–40.0)

## 2018-01-14 LAB — HEMOGLOBIN A1C: HEMOGLOBIN A1C: 8 % — AB (ref 4.6–6.5)

## 2018-01-14 MED ORDER — MELOXICAM 15 MG PO TABS: 15.0000 mg | ORAL_TABLET | Freq: Every day | ORAL | 1 refills | Status: DC | PRN

## 2018-01-14 MED ORDER — BUSPIRONE HCL 10 MG PO TABS: 5.0000 mg | ORAL_TABLET | Freq: Three times a day (TID) | ORAL | 2 refills | Status: DC

## 2018-01-14 MED ORDER — SUMATRIPTAN SUCCINATE 50 MG PO TABS: 50.0000 mg | ORAL_TABLET | ORAL | 3 refills | Status: DC | PRN

## 2018-01-14 NOTE — Assessment & Plan Note (Signed)
Well controlled, no changes to meds. Encouraged heart healthy diet such as the DASH diet and exercise as tolerated.  °

## 2018-01-14 NOTE — Assessment & Plan Note (Signed)
Feels the Fluoxetine is helping with decent side effects is only taking Buspar bid will increase to tid

## 2018-01-14 NOTE — Patient Instructions (Signed)
Panic Attack A panic attack is a sudden episode of severe anxiety, fear, or discomfort that causes physical and emotional symptoms. The attack may be in response to something frightening, or it may occur for no known reason. Symptoms of a panic attack can be similar to symptoms of a heart attack or stroke. It is important to see your health care provider when you have a panic attack so that these conditions can be ruled out. A panic attack is a symptom of another condition. Most panic attacks go away with treatment of the underlying problem. If you have panic attacks often, you may have a condition called panic disorder. What are the causes? A panic attack may be caused by:  An extreme, life-threatening situation, such as a war or natural disaster.  An anxiety disorder, such as post-traumatic stress disorder.  Depression.  Certain medical conditions, including heart problems, neurological conditions, and infections.  Certain over-the-counter and prescription medicines.  Illegal drugs that increase heart rate and blood pressure, such as methamphetamine.  Alcohol.  Supplements that increase anxiety.  Panic disorder.  What increases the risk? You are more likely to develop this condition if:  You have an anxiety disorder.  You have another mental health condition.  You take certain medicines.  You use alcohol, illegal drugs, or other substances.  You are under extreme stress.  A life event is causing increased feelings of anxiety and depression.  What are the signs or symptoms? A panic attack starts suddenly, usually lasts about 20 minutes, and occurs with one or more of the following:  A pounding heart.  A feeling that your heart is beating irregularly or faster than normal (palpitations).  Sweating.  Trembling or shaking.  Shortness of breath or feeling smothered.  Feeling choked.  Chest pain or discomfort.  Nausea or a strange feeling in your  stomach.  Dizziness, feeling lightheaded, or feeling like you might faint.  Chills or hot flashes.  Numbness or tingling in your lips, hands, or feet.  Feeling confused, or feeling that you are not yourself.  Fear of losing control or being emotionally unstable.  Fear of dying.  How is this diagnosed? A panic attack is diagnosed with an assessment by your health care provider. During the assessment your health care provider will ask questions about:  Your history of anxiety, depression, and panic attacks.  Your medical history.  Whether you drink alcohol, use illegal drugs, take supplements, or take medicines. Be honest about your substance use.  Your health care provider may also:  Order blood tests or other kinds of tests to rule out serious medical conditions.  Refer you to a mental health professional for further evaluation.  How is this treated? Treatment depends on the cause of the panic attack:  If the cause is a medical problem, your health care provider will either treat that problem or refer you to a specialist.  If the cause is emotional, you may be given anti-anxiety medicines or referred to a counselor. These medicines may reduce how often attacks happen, reduce how severe the attacks are, and lower anxiety.  If the cause is a medicine, your health care provider may tell you to stop the medicine, change your dose, or take a different medicine.  If the cause is a drug, treatment may involve letting the drug wear off and taking medicine to help the drug leave your body or to counteract its effects. Attacks caused by drug abuse may continue even if you stop using   the drug.  Follow these instructions at home:  Take over-the-counter and prescription medicines only as told by your health care provider.  If you feel anxious, limit your caffeine intake.  Take good care of your physical and mental health by: ? Eating a balanced diet that includes plenty of fresh  fruits and vegetables, whole grains, lean meats, and low-fat dairy. ? Getting plenty of rest. Try to get 7-8 hours of uninterrupted sleep each night. ? Exercising regularly. Try to get 30 minutes of physical activity at least 5 days a week. ? Not smoking. Talk to your health care provider if you need help quitting. ? Limiting alcohol intake to no more than 1 drink a day for nonpregnant women and 2 drinks a day for men. One drink equals 12 oz of beer, 5 oz of wine, or 1 oz of hard liquor.  Keep all follow-up visits as told by your health care provider. This is important. Panic attacks may have underlying physical or emotional problems that take time to accurately diagnose. Contact a health care provider if:  Your symptoms do not improve, or they get worse.  You are not able to take your medicine as prescribed because of side effects. Get help right away if:  You have serious thoughts about hurting yourself or others.  You have symptoms of a panic attack. Do not drive yourself to the hospital. Have someone else drive you or call an ambulance. If you ever feel like you may hurt yourself or others, or you have thoughts about taking your own life, get help right away. You can go to your nearest emergency department or call:  Your local emergency services (911 in the [place]  A suicide crisis helpline, such as the National Suicide Prevention Lifeline at 1-800-273-8255. This is open 24 hours a day.  Summary  A panic attack is a sign of a serious health or mental health condition. Get help right away. Do not drive yourself to the hospital. Have someone else drive you or call an ambulance.  Always see a health care provider to have the reasons for the panic attack correctly diagnosed.  If your panic attack was caused by a physical problem, follow your health care provider's suggestions for medicine, referral to a specialist, and lifestyle changes.  If your panic attack was caused by an  emotional problem, follow through with counseling from a qualified mental health specialist.  If you feel like you may hurt yourself or others, call 911 and get help right away. This information is not intended to replace advice given to you by your health care provider. Make sure you discuss any questions you have with your health care provider. Document Released: 07/28/2005 Document Revised: 09/05/2016 Document Reviewed: 09/05/2016 Elsevier Interactive Patient Education  2018 [employer]  

## 2018-01-14 NOTE — Assessment & Plan Note (Signed)
Encouraged heart healthy diet, increase exercise, avoid trans fats, consider a krill oil cap daily 

## 2018-01-14 NOTE — Assessment & Plan Note (Signed)
hgba1c acceptable, minimize simple carbs. Increase exercise as tolerated. Continue current meds 

## 2018-01-14 NOTE — Assessment & Plan Note (Signed)
Has been worse lately with increased stress, poor hydration, less exercise and poor diet. Discussed options for improvement and try topical biofreeze to tingling sensation in legs

## 2018-01-14 NOTE — Progress Notes (Signed)
Subjective:  I acted as a Neurosurgeon for Dr. Abner Greenspan. Nicole Ball, [place]  Patient ID: Nicole Ball, female    DOB: 01-11-1976, 42 y.o.   MRN: [id number]  No chief complaint on file.   HPI  Patient is in today for 3 month follow up and she has been under a great deal of stress.  Her father died in November 17, 2022 and she was running back and forth to [place].  Her husband continues to be ill and is preparing for surgery at [facility] again.  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  As a result she been having more palpitations and more panic attacks.  She does note the BuSpar helps some but not completely.  She also is noting some recent vaginal discharge with a mild odor.  No pain or burning associated.  No fevers or chills.  No recent febrile illness or hospitalizations. Denies CP/HA/congestion/fevers/GI or GU c/o. Taking meds as prescribed  Patient Care Team: [name], MD as PCP - General (Family Medicine)   Past Medical History:  Diagnosis Date  . Abnormal serum level of alkaline phosphatase 05/21/2014  . Acute low back pain 12/19/2010  . ALLERGIC RHINITIS 10/16/2010  . Allergy    seasonal  . Anxiety and depression 06/07/2011  . Atrophic vaginitis 08/17/2016  . Cervical cancer screening 01/10/2014   Menarche at 8 Regular and moderate flow, became irregular until OCPs  history of abnormal pap in past, repeat was normal Last pap roughly 3 years ago G0P0, s/p No history of abnormal MGM, no h/o MGM  No concerns today no gyn surgeries LMP May 2013  . CTS (carpal tunnel syndrome) 01/15/2014  . Diabetes mellitus type 2 in obese (HCC) 10/30/2011  . Dry eyes 08/17/2016  . Early menopause   . Elevated BP 04/21/2011  . Essential hypertension   . HIATAL HERNIA WITH REFLUX, HX OF 10/16/2010  . Hyperlipidemia 04/21/2011  . Inappropriate sinus tachycardia   . INSOMNIA 10/16/2010  . Knee pain, left 03/11/2012  . Migraine 03/07/2013  . Obesity   . Obesity (BMI 30-39.9) 01/12/2018  .  Palpitations 01/28/2015  . Reflux 04/22/2012  . Sleep apnea 06/07/2011  . Tobacco use disorder 02/10/2017  . Urinary urgency 08/17/2016  . UTI'S, HX OF 10/16/2010    Past Surgical History:  Procedure Laterality Date  . CHOLECYSTECTOMY      Family History  Problem Relation Age of Onset  . Lupus Mother   . Arthritis Maternal Grandmother   . Scleroderma Maternal Grandmother   . Glaucoma Maternal Grandmother   . Cataracts Maternal Grandmother   . Cancer Maternal Grandfather        prostate  . Coronary artery disease Maternal Grandfather        s/p angioplasty  . Diabetes Maternal Grandfather        Type 2  . Cancer Paternal Grandmother        Breast ca- dx in 24's  . Thyroid disease Paternal Grandmother   . Breast cancer Paternal Grandmother   . Diabetes Paternal Grandfather   . Thyroid disease Sister   . Asthma Sister   . Allergies Brother   . Arthritis Other     Social History   Socioeconomic History  . Marital status: Married    Spouse name: Not on file  . Number of children: Not on file  . Years of education: Not on file  . Highest education level: Not on  file  Occupational History  . Not on file  Social Needs  . Financial resource strain: Not on file  . Food insecurity:    Worry: Not on file    Inability: Not on file  . Transportation needs:    Medical: Not on file    Non-medical: Not on file  Tobacco Use  . Smoking status: Current Some Day Smoker    Packs/day: 0.10    Types: Cigars  . Smokeless tobacco: Never Used  . Tobacco comment: 1 black and milds  Substance and Sexual Activity  . Alcohol use: Yes    Comment: twice/year  . Drug use: No  . Sexual activity: Not on file  Lifestyle  . Physical activity:    Days per week: Not on file    Minutes per session: Not on file  . Stress: Not on file  Relationships  . Social connections:    Talks on phone: Not on file    Gets together: Not on file    Attends religious service: Not on file    Active member  of club or organization: Not on file    Attends meetings of clubs or organizations: Not on file    Relationship status: Not on file  . Intimate partner violence:    Fear of current or ex partner: Not on file    Emotionally abused: Not on file    Physically abused: Not on file    Forced sexual activity: Not on file  Other Topics Concern  . Not on file  Social History Narrative   Married lives w/ husband, works at [employer]    Outpatient Medications Prior to Visit  Medication Sig Dispense Refill  . ACETAMINOPHEN-BUTALBITAL 50-325 MG TABS Take 1 tablet by mouth 2 (two) times daily as needed (HEADACHES). 120 each 0  . amitriptyline (ELAVIL) 10 MG tablet Take 1 tablet (10 mg total) by mouth at bedtime. 60 tablet 2  . atorvastatin (LIPITOR) 10 MG tablet TAKE 1 TABLET(10 MG) BY MOUTH DAILY 90 tablet 0  . BAYER MICROLET LANCETS lancets Use as directed once daily to check blood sugar.   DX E11.9 100 each 1  . Blood Glucose Monitoring Suppl (CONTOUR BLOOD GLUCOSE SYSTEM) DEVI Use daily to check blood sugar. DX E11.9 1 Device 0  . conjugated estrogens (PREMARIN) vaginal cream Place 1 Applicatorful vaginally once a week. 42.5 g 121  . CONTOUR NEXT TEST test strip USE TO CHECK BLOOD SUGAR DAILY 100 each 2  . cyclobenzaprine (FLEXERIL) 10 MG tablet TAKE 1 TABLET(10 MG) BY MOUTH TWICE DAILY AS NEEDED FOR MUSCLE SPASMS 60 tablet 0  . fluconazole (DIFLUCAN) 150 MG tablet Take 1 tablet (150 mg total) by mouth once a week. 3 tablet 0  . FLUoxetine (PROZAC) 40 MG capsule TAKE 1 CAPSULE(40 MG) BY MOUTH DAILY 90 capsule 0  . fluticasone (FLONASE) 50 MCG/ACT nasal spray Place 2 sprays into both nostrils daily. 16 g 6  . HYDROcodone-acetaminophen (NORCO/VICODIN) 5-325 MG tablet Take 1 tablet by mouth every 6 (six) hours as needed. Patient needs appointment for further refills. 100 tablet 0  . metFORMIN (GLUCOPHAGE) 500 MG tablet TAKE 2 TABLETS BY MOUTH TWICE DAILY 120 tablet 0  . montelukast (SINGULAIR) 10  MG tablet TAKE 1 TABLET(10 MG) BY MOUTH AT BEDTIME 30 tablet 2  . nystatin cream (MYCOSTATIN) Apply 1 application topically 2 (two) times daily. 30 g 2  . omeprazole (PRILOSEC) 20 MG capsule Take 1 capsule (20 mg total) by mouth 2 (  two) times daily before a meal. 60 capsule 2  . promethazine (PHENERGAN) 25 MG tablet Take 1 tablet (25 mg total) by mouth every 8 (eight) hours as needed for nausea or vomiting. 20 tablet 0  . propranolol ER (INDERAL LA) 60 MG 24 hr capsule Take 1 capsule (60 mg total) by mouth daily. Patient needs to call and schedule an appointment for further refills 1st attempt 30 capsule 3  . ranitidine (ZANTAC) 300 MG capsule Take 1 capsule (300 mg total) by mouth every evening. 30 capsule 3  . Saline 0.65 % (SOLN) SOLN Place 2 Squirts into the nose 2 (two) times daily. (Patient taking differently: Place 2 Squirts into the nose 3 (three) times a week. ) 1 Bottle 0  . busPIRone (BUSPAR) 10 MG tablet Take 0.5-1 tablets (5-10 mg total) by mouth 3 (three) times daily. 90 tablet 2  . meloxicam (MOBIC) 15 MG tablet Take 1 tablet (15 mg total) by mouth daily as needed for pain. 90 tablet 1  . SUMAtriptan (IMITREX) 50 MG tablet Take 1 tablet (50 mg total) every 2 (two) hours as needed by mouth for migraine. May repeat in 2 hours if headache persists or recurs. 10 tablet 3   No facility-administered medications prior to visit.     No Known Allergies  Review of Systems  Constitutional: Negative for fever and malaise/fatigue.  HENT: Negative for congestion.   Eyes: Negative for blurred vision.  Respiratory: Negative for shortness of breath.   Cardiovascular: Negative for chest pain, palpitations and leg swelling.  Gastrointestinal: Negative for abdominal pain, blood in stool and nausea.  Genitourinary: Negative for dysuria and frequency.  Musculoskeletal: Positive for joint pain and myalgias. Negative for falls.  Skin: Negative for rash.  Neurological: Negative for dizziness, loss of  consciousness and headaches.  Endo/Heme/Allergies: Negative for environmental allergies.  Psychiatric/Behavioral: Positive for depression. The patient is nervous/anxious and has insomnia.        Objective:    Physical Exam  BP 126/72 (BP Location: Left Arm, Patient Position: Sitting, Cuff Size: Normal)   Pulse 90   Temp 98 F (36.7 C) (Oral)   Resp 18   Wt 192 lb 12.8 oz (87.5 kg)   LMP 01/08/2013   SpO2 99%   BMI 33.62 kg/m  Wt Readings from Last 3 Encounters:  01/14/18 192 lb 12.8 oz (87.5 kg)  01/12/18 192 lb 9.6 oz (87.4 kg)  10/13/17 196 lb 12.8 oz (89.3 kg)   BP Readings from Last 3 Encounters:  01/14/18 126/72  01/12/18 (!) 136/104  10/14/17 124/84     Immunization History  Administered Date(s) Administered  . Influenza Whole 04/18/2011  . Influenza,inj,Quad PF,6+ Mos 05/20/2013, 05/19/2014, 07/03/2015, 08/15/2016  . Pneumococcal Conjugate-13 12/28/2015  . Tdap 10/14/2013    Health Maintenance  Topic Date Due  . PNEUMOCOCCAL POLYSACCHARIDE VACCINE (1) 05/31/1976  . URINE MICROALBUMIN  09/16/2016  . FOOT EXAM  12/27/2016  . OPHTHALMOLOGY EXAM  02/08/2017  . INFLUENZA VACCINE  03/11/2018  . HEMOGLOBIN A1C  04/15/2018  . PAP SMEAR  07/14/2020  . TETANUS/TDAP  10/15/2023  . HIV Screening  Completed    Lab Results  Component Value Date   WBC 7.9 10/13/2017   HGB 13.2 10/13/2017   HCT 40.8 10/13/2017   PLT 214.0 10/13/2017   GLUCOSE 91 10/13/2017   CHOL 132 10/13/2017   TRIG 72.0 10/13/2017   HDL 74.10 10/13/2017   LDLCALC 43 10/13/2017   ALT 23 10/13/2017   AST 14  10/13/2017   NA 140 10/13/2017   K 3.5 10/13/2017   CL 103 10/13/2017   CREATININE 0.72 10/13/2017   BUN 10 10/13/2017   CO2 25 10/13/2017   TSH 0.86 10/13/2017   HGBA1C 7.7 (H) 10/13/2017   MICROALBUR <0.7 09/17/2015    Lab Results  Component Value Date   TSH 0.86 10/13/2017   Lab Results  Component Value Date   WBC 7.9 10/13/2017   HGB 13.2 10/13/2017   HCT 40.8  10/13/2017   MCV 88.8 10/13/2017   PLT 214.0 10/13/2017   Lab Results  Component Value Date   NA 140 10/13/2017   K 3.5 10/13/2017   CO2 25 10/13/2017   GLUCOSE 91 10/13/2017   BUN 10 10/13/2017   CREATININE 0.72 10/13/2017   BILITOT 0.2 10/13/2017   ALKPHOS 134 (H) 10/13/2017   AST 14 10/13/2017   ALT 23 10/13/2017   PROT 8.6 (H) 10/13/2017   ALBUMIN 4.6 10/13/2017   CALCIUM 10.3 (H) 10/27/2017   GFR 113.50 10/13/2017   Lab Results  Component Value Date   CHOL 132 10/13/2017   Lab Results  Component Value Date   HDL 74.10 10/13/2017   Lab Results  Component Value Date   LDLCALC 43 10/13/2017   Lab Results  Component Value Date   TRIG 72.0 10/13/2017   Lab Results  Component Value Date   CHOLHDL 2 10/13/2017   Lab Results  Component Value Date   HGBA1C 7.7 (H) 10/13/2017         Assessment & Plan:   Problem List Items Addressed This Visit    Hyperlipidemia    Encouraged heart healthy diet, increase exercise, avoid trans fats, consider a krill oil cap daily      Relevant Orders   Lipid panel   Anxiety and depression    Feels the Fluoxetine is helping with decent side effects is only taking Buspar bid will increase to tid      Relevant Medications   busPIRone (BUSPAR) 10 MG tablet   Diabetes mellitus type 2 in obese (HCC)    hgba1c acceptable, minimize simple carbs. Increase exercise as tolerated. Continue current meds      Relevant Orders   Hemoglobin A1c   HTN (hypertension)    Well controlled, no changes to meds. Encouraged heart healthy diet such as the DASH diet and exercise as tolerated.       Relevant Orders   CBC   Comprehensive metabolic panel   TSH   Myalgia    Has been worse lately with increased stress, poor hydration, less exercise and poor diet. Discussed options for improvement and try topical biofreeze to tingling sensation in legs       Other Visit Diagnoses    Subacute vaginitis    -  Primary   Relevant Orders    Urine cytology ancillary only      I have changed Lanitra Z. Elenbaas's SUMAtriptan. I am also having her maintain her Saline, BAYER MICROLET LANCETS, fluticasone, CONTOUR NEXT TEST, nystatin cream, ACETAMINOPHEN-BUTALBITAL, amitriptyline, ranitidine, propranolol ER, CONTOUR BLOOD GLUCOSE SYSTEM, omeprazole, montelukast, conjugated estrogens, metFORMIN, cyclobenzaprine, HYDROcodone-acetaminophen, promethazine, fluconazole, FLUoxetine, atorvastatin, meloxicam, and busPIRone.  Meds ordered this encounter  Medications  . meloxicam (MOBIC) 15 MG tablet    Sig: Take 1 tablet (15 mg total) by mouth daily as needed for pain.    Dispense:  90 tablet    Refill:  1  . SUMAtriptan (IMITREX) 50 MG tablet    Sig: Take 1  tablet (50 mg total) by mouth every 2 (two) hours as needed for migraine. May repeat in 2 hours if headache persists or recurs.    Dispense:  10 tablet    Refill:  3  . busPIRone (BUSPAR) 10 MG tablet    Sig: Take 0.5-1 tablets (5-10 mg total) by mouth 3 (three) times daily.    Dispense:  90 tablet    Refill:  2    CMA served as scribe during this visit. History, Physical and Plan performed by medical provider. Documentation and orders reviewed and attested to.  Danise Edge, MD

## 2018-01-16 ENCOUNTER — Other Ambulatory Visit: Payer: Self-pay | Admitting: Physician Assistant

## 2018-01-18 LAB — URINE CYTOLOGY ANCILLARY ONLY: CANDIDA VAGINITIS: NEGATIVE

## 2018-01-19 ENCOUNTER — Other Ambulatory Visit: Payer: Self-pay | Admitting: Family Medicine

## 2018-01-19 MED ORDER — METRONIDAZOLE 500 MG PO TABS: 500.0000 mg | ORAL_TABLET | Freq: Three times a day (TID) | ORAL | 0 refills | Status: DC

## 2018-01-21 ENCOUNTER — Telehealth: Payer: Self-pay | Admitting: Cardiology

## 2018-01-21 NOTE — Telephone Encounter (Signed)
Notes recorded by Phineas Semenobertson, Robynne Roat, RN on 01/21/2018 at 4:01 PM EDT Pt states she is intolerant to metoprolol. She was switched from metoprolol to propranolol 12/09/16.   Per Ronie Spiesayna Dunn, PA on 12/09/16 Inappropriate sinus tachycardia - prior workup unremarkable. We discussed that beta blockers can have both hyper- as well as hypoglycemia as side effect; difficult to predict which patients will fall into which categories. She has increased awareness of her heart rate recently. Given her h/o migraines I think it would be worthwhile to try a trial of propranolol in lieu of her metoprolol - will start 60mg  daily and d/c metoprolol. Discussed importance of observing for blood sugar swings. Also reviewed importance of staying well hydrated (at least 64oz of fluid per day) and reasonable liberalization of salt, as well as overall lifestyle modifications.  Informed pt will forward to Dr. Mayford Knifeurner for further recommendations and give her a call back. She stated understanding and thankful for th call   See holter result note from 01/13/18

## 2018-01-21 NOTE — Telephone Encounter (Signed)
New message    Pt is retruning call to rn.

## 2018-01-22 ENCOUNTER — Other Ambulatory Visit: Payer: Self-pay | Admitting: Family Medicine

## 2018-01-26 ENCOUNTER — Telehealth: Payer: Self-pay | Admitting: Cardiology

## 2018-01-26 NOTE — Telephone Encounter (Signed)
New Message   Patient is returning call in reference to changing her medication. She also has questions about her DME for her CPAP.  Please call to discuss.

## 2018-01-26 NOTE — Telephone Encounter (Signed)
Informed pt since she is intolerant to metoprolol she will continue taking propanolol and Dr. Mayford Knifeurner has referred her to EP. Informed her that I sent a message to Glynda JaegerMelissa Tatum, scheduler to call back with an appt. She states she was laid off from her job, she has major anxiety and is stressed out. She states her MD increased her Buspar and she needs a letter to be out of work. I informed pt to follow up with her primary MD regarding a work note.   She also wanted an update on her CPAP. I advised her that I will forward to Veda CanningNina Jones to follow up and give her a call back. She stated understanding and thankful for the call.

## 2018-02-04 ENCOUNTER — Other Ambulatory Visit: Payer: Self-pay | Admitting: Physician Assistant

## 2018-02-04 ENCOUNTER — Other Ambulatory Visit: Payer: Self-pay | Admitting: Family Medicine

## 2018-02-04 DIAGNOSIS — M25562 Pain in left knee: Secondary | ICD-10-CM

## 2018-02-05 MED ORDER — HYDROCODONE-ACETAMINOPHEN 5-325 MG PO TABS: 1.0000 | ORAL_TABLET | Freq: Four times a day (QID) | ORAL | 0 refills | Status: DC | PRN

## 2018-02-05 NOTE — Telephone Encounter (Signed)
Last hydrocodone RX: 12/31/17, #100 Last OV: 01/14/18 Next OV: 04/22/18 UDS: 10/13/17 CSC: 08/15/16, past due CSR: No discrepancies identified

## 2018-02-06 ENCOUNTER — Other Ambulatory Visit: Payer: Self-pay | Admitting: Family Medicine

## 2018-02-09 ENCOUNTER — Encounter: Payer: Self-pay | Admitting: Internal Medicine

## 2018-02-09 ENCOUNTER — Ambulatory Visit (INDEPENDENT_AMBULATORY_CARE_PROVIDER_SITE_OTHER): Payer: 59 | Admitting: Internal Medicine

## 2018-02-09 VITALS — BP 128/86 | HR 95 | Ht 63.5 in | Wt 190.0 lb

## 2018-02-09 DIAGNOSIS — I10 Essential (primary) hypertension: Secondary | ICD-10-CM

## 2018-02-09 DIAGNOSIS — R002 Palpitations: Secondary | ICD-10-CM

## 2018-02-09 NOTE — Progress Notes (Signed)
ELECTROPHYSIOLOGY CONSULT NOTE  Patient ID: Nicole Ball, MRN: [id number], DOB/AGE: April 10, 1976 42 y.o. Admit date: (Not on file) Date of Consult: 02/09/2018  Primary Physician: Bradd CanaryBlyth, Stacey A, MD Primary Cardiologist:  Demetrius CharityKN     Nicole Ball is a 42 y.o. female who is being seen today for the evaluation of palpitations at the request of Dr TT/KN.    HPI Nicole Linesishia Z Dore is a 42 y.o. female referred because of an abnormal holter monitor   In the last 6-8 years she describes significant issues with anxiety and panic which is been accompanied by tachypalpitations.  She also struggles with chronic pain for which she takes opiates.  She has been on SSRIs and SNRIs and currently is managed with a combination of Prozac and BuSpar.  Over the last few years she is continued to have increasing problems with palpitations.  In addition to her baseline rapid rate, she asked a minute or 2 where she feels her heart rate is going very fast and sometimes irregularly.  This prompted a Holter monitor which was personally reviewed.  There were diary events associated only with sinus rhythm-normal;  PVCs Were not associated with symptoms although she does describe skips.  She is not fit.  She has some modest dyspnea on exertion and some exertional tachypalpitations.  No exertional chest discomfort been quite anxious about coronary disease.  Recommendation in the past had included a GXT which was never consummated  There has been flurry of psychosocial stressors in her life related to family including the recent passing of her father.   A presumptive diagnosis of inappropriate sinus tachycardia was made (probably correctly) she was started on Inderal.  This is helped modestly.  Previously she had been tried on metoprolol.  DATE TEST EF   8/16 Echo   65 %           Past Medical History:  Diagnosis Date  . Abnormal serum level of alkaline phosphatase 05/21/2014  . Acute low back pain  12/19/2010  . ALLERGIC RHINITIS 10/16/2010  . Allergy    seasonal  . Anxiety and depression 06/07/2011  . Atrophic vaginitis 08/17/2016  . Cervical cancer screening 01/10/2014   Menarche at 8 Regular and moderate flow, became irregular until OCPs  history of abnormal pap in past, repeat was normal Last pap roughly 3 years ago G0P0, s/p No history of abnormal MGM, no h/o MGM  No concerns today no gyn surgeries LMP May 2013  . CTS (carpal tunnel syndrome) 01/15/2014  . Diabetes mellitus type 2 in obese (HCC) 10/30/2011  . Dry eyes 08/17/2016  . Early menopause   . Elevated BP 04/21/2011  . Essential hypertension   . HIATAL HERNIA WITH REFLUX, HX OF 10/16/2010  . Hyperlipidemia 04/21/2011  . Inappropriate sinus tachycardia   . INSOMNIA 10/16/2010  . Knee pain, left 03/11/2012  . Migraine 03/07/2013  . Obesity   . Obesity (BMI 30-39.9) 01/12/2018  . Palpitations 01/28/2015  . Reflux 04/22/2012  . Sleep apnea 06/07/2011  . Tobacco use disorder 02/10/2017  . Urinary urgency 08/17/2016  . UTI'S, HX OF 10/16/2010      Surgical History:  Past Surgical History:  Procedure Laterality Date  . CHOLECYSTECTOMY       Home Meds: Prior to Admission medications   Medication Sig Start Date End Date Taking? Authorizing Provider  ACETAMINOPHEN-BUTALBITAL 50-325 MG TABS Take 1 tablet by mouth 2 (two) times daily as needed (HEADACHES). 05/05/17  Yes Abner GreenspanBlyth,  Bryon Lions, MD  amitriptyline (ELAVIL) 10 MG tablet Take 1 tablet (10 mg total) by mouth at bedtime. 05/05/17  Yes [name], MD  atorvastatin (LIPITOR) 10 MG tablet TAKE 1 TABLET(10 MG) BY MOUTH DAILY 01/13/18  Yes [name], MD  BAYER MICROLET LANCETS lancets Use as directed once daily to check blood sugar.   DX E11.9 03/20/16  Yes [name], MD  Blood Glucose Monitoring Suppl (CONTOUR BLOOD GLUCOSE SYSTEM) DEVI Use daily to check blood sugar. DX E11.9 09/09/17  Yes [name], MD  busPIRone (BUSPAR) 10 MG tablet Take 0.5-1 tablets (5-10 mg total) by mouth  3 (three) times daily. 01/14/18  Yes [name], MD  conjugated estrogens (PREMARIN) vaginal cream Place 1 Applicatorful vaginally once a week. 10/13/17  Yes [name], MD  CONTOUR NEXT TEST test strip USE TO CHECK BLOOD SUGAR DAILY 03/11/17  Yes [name], MD  cyclobenzaprine (FLEXERIL) 10 MG tablet TAKE 1 TABLET(10 MG) BY MOUTH TWICE DAILY AS NEEDED FOR MUSCLE SPASMS 12/17/17  Yes [name], MD  fluconazole (DIFLUCAN) 150 MG tablet Take 1 tablet (150 mg total) by mouth once a week. 01/11/18  Yes [name], MD  FLUoxetine (PROZAC) 40 MG capsule TAKE 1 CAPSULE(40 MG) BY MOUTH DAILY 01/12/18  Yes [name], MD  fluticasone (FLONASE) 50 MCG/ACT nasal spray Place 2 sprays into both nostrils daily. 07/29/16  Yes [name], MD  HYDROcodone-acetaminophen (NORCO/VICODIN) 5-325 MG tablet Take 1 tablet by mouth every 6 (six) hours as needed. Patient needs appointment for further refills. 02/05/18  Yes [name], MD  meloxicam (MOBIC) 15 MG tablet Take 1 tablet (15 mg total) by mouth daily as needed for pain. 01/14/18  Yes [name], MD  metFORMIN (GLUCOPHAGE) 500 MG tablet TAKE 2 TABLETS BY MOUTH TWICE DAILY 01/22/18  Yes [name], MD  metroNIDAZOLE (FLAGYL) 500 MG tablet Take 1 tablet (500 mg total) by mouth 3 (three) times daily. 01/19/18  Yes [name], MD  montelukast (SINGULAIR) 10 MG tablet TAKE 1 TABLET(10 MG) BY MOUTH AT BEDTIME 01/22/18  Yes [name], MD  nystatin cream (MYCOSTATIN) Apply 1 application topically 2 (two) times daily. 05/05/17  Yes [name], MD  omeprazole (PRILOSEC) 20 MG capsule TAKE 1 CAPSULE(20 MG) BY MOUTH TWICE DAILY BEFORE A MEAL 02/09/18  Yes [name], MD  promethazine (PHENERGAN) 25 MG tablet Take 1 tablet (25 mg total) by mouth every 8 (eight) hours as needed for nausea or vomiting. 01/01/18  Yes [name], MD  propranolol ER (INDERAL LA) 60 MG 24 hr capsule Take 1 capsule (60 mg total) by mouth daily.  02/05/18  Yes Lars Masson, MD  ranitidine (ZANTAC) 300 MG capsule Take 1 capsule (300 mg total) by mouth every evening. 05/05/17  Yes [name], MD  Saline 0.65 % (SOLN) SOLN Place 2 Squirts into the nose 2 (two) times daily. Patient taking differently: Place 2 Squirts into the nose 3 (three) times a week.  07/11/11  Yes [name], MD  SUMAtriptan (IMITREX) 50 MG tablet Take 1 tablet (50 mg total) by mouth every 2 (two) hours as needed for migraine. May repeat in 2 hours if headache persists or recurs. 01/14/18  Yes [name], MD    Allergies: No Known Allergies  Social History   Socioeconomic History  . Marital status: Married    Spouse name: Not  on file  . Number of children: Not on file  . Years of education: Not on file  . Highest education level: Not on file  Occupational History  . Not on file  Social Needs  . Financial resource strain: Not on file  . Food insecurity:    Worry: Not on file    Inability: Not on file  . Transportation needs:    Medical: Not on file    Non-medical: Not on file  Tobacco Use  . Smoking status: Current Some Day Smoker    Packs/day: 0.10    Types: Cigars  . Smokeless tobacco: Never Used  . Tobacco comment: 1 black and milds  Substance and Sexual Activity  . Alcohol use: Yes    Comment: twice/year  . Drug use: No  . Sexual activity: Not on file  Lifestyle  . Physical activity:    Days per week: Not on file    Minutes per session: Not on file  . Stress: Not on file  Relationships  . Social connections:    Talks on phone: Not on file    Gets together: Not on file    Attends religious service: Not on file    Active member of club or organization: Not on file    Attends meetings of clubs or organizations: Not on file    Relationship status: Not on file  . Intimate partner violence:    Fear of current or ex partner: Not on file    Emotionally abused: Not on file    Physically abused: Not on file    Forced sexual  activity: Not on file  Other Topics Concern  . Not on file  Social History Narrative   Married lives w/ husband, works at [employer]     Family History  Problem Relation Age of Onset  . Lupus Mother   . Arthritis Maternal Grandmother   . Scleroderma Maternal Grandmother   . Glaucoma Maternal Grandmother   . Cataracts Maternal Grandmother   . Cancer Maternal Grandfather        prostate  . Coronary artery disease Maternal Grandfather        s/p angioplasty  . Diabetes Maternal Grandfather        Type 2  . Cancer Paternal Grandmother        Breast ca- dx in 57's  . Thyroid disease Paternal Grandmother   . Breast cancer Paternal Grandmother   . Diabetes Paternal Grandfather   . Thyroid disease Sister   . Asthma Sister   . Allergies Brother   . Arthritis Other      ROS:  Please see the history of present illness.     All other systems reviewed and negative.    Physical Exam: Blood pressure 128/86, pulse 95, height 5' 3.5" (1.613 m), weight 190 lb (86.2 kg), last menstrual period 01/08/2013, SpO2 97 %. General: Well developed, well nourished female in no acute distress. Head: Normocephalic, atraumatic, sclera non-icteric, no xanthomas, nares are without discharge. EENT: normal  Lymph Nodes:  none Neck: Negative for carotid bruits. JVD not elevated. Back:without scoliosis kyphosis Lungs: Clear bilaterally to auscultation without wheezes, rales, or rhonchi. Breathing is unlabored. Heart: RRR with S1 S2. No * murmur . No rubs, or gallops appreciated. Abdomen: Soft, non-tender, non-distended with normoactive bowel sounds. No hepatomegaly. No rebound/guarding. No obvious abdominal masses. Msk:  Strength and tone appear normal for age. Extremities: No clubbing or cyanosis. No edema.  Distal pedal pulses are 2+ and  equal bilaterally. Skin: Warm and Dry Neuro: Alert and oriented X 3. CN III-XII intact Grossly normal sensory and motor function . Psych:  Responds to questions  appropriately with a normal affect.      Labs: Cardiac Enzymes No results for input(s): CKTOTAL, CKMB, TROPONINI in the last 72 hours. CBC Lab Results  Component Value Date   WBC 7.2 01/14/2018   HGB 13.2 01/14/2018   HCT 41.0 01/14/2018   MCV 88.8 01/14/2018   PLT 200.0 01/14/2018   PROTIME: No results for input(s): LABPROT, INR in the last 72 hours. Chemistry No results for input(s): NA, K, CL, CO2, BUN, CREATININE, CALCIUM, PROT, BILITOT, ALKPHOS, ALT, AST, GLUCOSE in the last 168 hours.  Invalid input(s): LABALBU Lipids Lab Results  Component Value Date   CHOL 159 01/14/2018   HDL 67.60 01/14/2018   LDLCALC 61 01/14/2018   TRIG 155.0 (H) 01/14/2018   BNP No results found for: PROBNP Thyroid Function Tests: No results for input(s): TSH, T4TOTAL, T3FREE, THYROIDAB in the last 72 hours.  Invalid input(s): FREET3 Miscellaneous No results found for: DDIMER  Radiology/Studies:  No results found.  EKG: Sinus @ 94 13/09*/35   Assessment and Plan:   Inappropriate sinus tachycardia  Anxiety  Hypertension  Migraines   The pt meets criteria for inappropriate sinus tachycardia, but in the context of her high stress and anxiety it may secondary  However given the elevated BP, will increase her propranolol 60>>120 which may help with migraines also  Have encouraged to seek stress relief-- gave her the name of Fred May at [facility]  Given her concerns about cardiac fitness, will do Ca Score  Will see her prn    Sherryl Manges

## 2018-02-09 NOTE — Patient Instructions (Signed)
Medication Instructions:  Your physician recommends that you continue on your current medications as directed. Please refer to the Current Medication list given to you today.  Labwork: None ordered.  Testing/Procedures: Your physician suggests you have a Calcium Scoring Test. This test has a separate cost of $150 paid at the time of the test.   Follow-Up: Your physician recommends that you schedule a follow-up appointment as needed.   Any Other Special Instructions Will Be Listed Below (If Applicable).     If you need a refill on your cardiac medications before your next appointment, please call your pharmacy.

## 2018-02-10 NOTE — Telephone Encounter (Signed)
[facility] Nicole Ball(Nicole Ball) called to say patient will not be set up with cpap due to having financial difficulties at this time.

## 2018-03-03 ENCOUNTER — Telehealth: Payer: Self-pay | Admitting: Internal Medicine

## 2018-03-03 MED ORDER — PROPRANOLOL HCL ER 60 MG PO CP24: 120.0000 mg | ORAL_CAPSULE | Freq: Every day | ORAL | 3 refills | Status: DC

## 2018-03-03 NOTE — Telephone Encounter (Signed)
New Message   Pt c/o medication issue:  1. Name of Medication: propranolol ER (INDERAL LA) 60 MG 24 hr capsule  2. How are you currently taking this medication (dosage and times per day)? Take 1 capsule (60 mg total) by mouth daily.  3. Are you having a reaction (difficulty breathing--STAT)? no  4. What is your medication issue? Pt states that Dr. Graciela HusbandsKlein changes the dosage to 120 so she has been taking double her dose so now she is out but states the adjusted dosage was never sent to her pharmacy. Please call

## 2018-03-03 NOTE — Telephone Encounter (Signed)
Refill has been sent into pt's preferred pharmacy.

## 2018-03-09 ENCOUNTER — Other Ambulatory Visit: Payer: Self-pay | Admitting: Family Medicine

## 2018-03-09 DIAGNOSIS — M25562 Pain in left knee: Secondary | ICD-10-CM

## 2018-03-09 NOTE — Telephone Encounter (Signed)
Requesting:hydrocodone  Contract:needs one  UDS:low risk next screen 04/15/18 Last OV:01/14/18 Next OV:04/22/18 Last Refill:02/05/18 #[phone] Database:   Please advise    Patient also requesting Flagyl

## 2018-03-10 MED ORDER — HYDROCODONE-ACETAMINOPHEN 5-325 MG PO TABS: 1.0000 | ORAL_TABLET | Freq: Four times a day (QID) | ORAL | 0 refills | Status: DC | PRN

## 2018-03-10 MED ORDER — METRONIDAZOLE 500 MG PO TABS: 500.0000 mg | ORAL_TABLET | Freq: Three times a day (TID) | ORAL | 0 refills | Status: DC

## 2018-03-16 ENCOUNTER — Inpatient Hospital Stay: Admission: RE | Admit: 2018-03-16 | Payer: Self-pay | Source: Ambulatory Visit

## 2018-04-10 ENCOUNTER — Other Ambulatory Visit: Payer: Self-pay | Admitting: Family Medicine

## 2018-04-21 ENCOUNTER — Other Ambulatory Visit: Payer: Self-pay | Admitting: Family Medicine

## 2018-04-21 ENCOUNTER — Telehealth: Payer: Self-pay | Admitting: Family Medicine

## 2018-04-21 DIAGNOSIS — M25562 Pain in left knee: Secondary | ICD-10-CM

## 2018-04-21 MED ORDER — HYDROCODONE-ACETAMINOPHEN 5-325 MG PO TABS: 1.0000 | ORAL_TABLET | Freq: Four times a day (QID) | ORAL | 0 refills | Status: DC | PRN

## 2018-04-21 NOTE — Telephone Encounter (Signed)
Contacted pt regarding refill request; instructed pt to contact pharmacy for refill of monoltelukast; she verbalizes  understanding; also verified pharmacy; will route to office for final disposition.   Hydrocodone-acetaminophen 5-325 refill Last Refill:03/10/18 # 100 Last OV: 01/14/18 PCP: Dr Abner Greenspan Pharmacy: [facility] [address], [place]  Pt has scheduled appointment with Dr Abner Greenspan 05/04/18 at 1700

## 2018-04-21 NOTE — Telephone Encounter (Signed)
Copied from CRM [id number]. Topic: Quick Communication - Rx Refill/Question >> Apr 21, 2018  9:56 AM Alexander Bergeron B wrote: Medication: montelukast (SINGULAIR) 10 MG tablet [[id number]]  HYDROcodone-acetaminophen (NORCO/VICODIN) 5-325 MG tablet [[id number]]   Has the patient contacted their pharmacy? Yes.   (Agent: If no, request that the patient contact the pharmacy for the refill.) (Agent: If yes, when and what did the pharmacy advise?)  Preferred Pharmacy (with phone number or street name): [facility]  Agent: Please be advised that RX refills may take up to 3 business days. We ask that you follow-up with your pharmacy.

## 2018-04-21 NOTE — Telephone Encounter (Signed)
done

## 2018-04-21 NOTE — Telephone Encounter (Signed)
Requesting: norco 5-325mg  1 tab every 6hr prn Contract: none UDS: 10/2017 Last OV: 01/14/18 Next Ov: 05/04/18 Last refill: 03/10/18, #100, 0RF Database: no discrepancies found  Please advise.

## 2018-04-22 ENCOUNTER — Ambulatory Visit: Payer: 59 | Admitting: Family Medicine

## 2018-04-26 ENCOUNTER — Telehealth: Payer: Self-pay

## 2018-04-26 ENCOUNTER — Other Ambulatory Visit: Payer: Self-pay | Admitting: Family Medicine

## 2018-04-26 DIAGNOSIS — M25562 Pain in left knee: Secondary | ICD-10-CM

## 2018-04-26 NOTE — Telephone Encounter (Signed)
PA initiated via Covermymeds; KEY: [id number]. Awaiting determination.

## 2018-04-27 NOTE — Telephone Encounter (Signed)
PA approved.

## 2018-05-04 ENCOUNTER — Other Ambulatory Visit (HOSPITAL_COMMUNITY)
Admission: RE | Admit: 2018-05-04 | Discharge: 2018-05-04 | Disposition: A | Discharge status: Home / Self Care | Payer: BC Managed Care – PPO | Source: Ambulatory Visit | Attending: Family Medicine | Admitting: Family Medicine

## 2018-05-04 ENCOUNTER — Encounter: Payer: Self-pay | Admitting: Family Medicine

## 2018-05-04 ENCOUNTER — Ambulatory Visit: Payer: BC Managed Care – PPO | Admitting: Family Medicine

## 2018-05-04 VITALS — BP 142/100 | HR 93 | Temp 97.6°F | Resp 18 | Wt 194.4 lb

## 2018-05-04 DIAGNOSIS — F32A Depression, unspecified: Secondary | ICD-10-CM

## 2018-05-04 DIAGNOSIS — E782 Mixed hyperlipidemia: Secondary | ICD-10-CM

## 2018-05-04 DIAGNOSIS — Z79899 Other long term (current) drug therapy: Secondary | ICD-10-CM

## 2018-05-04 DIAGNOSIS — K219 Gastro-esophageal reflux disease without esophagitis: Secondary | ICD-10-CM

## 2018-05-04 DIAGNOSIS — N760 Acute vaginitis: Secondary | ICD-10-CM | POA: Insufficient documentation

## 2018-05-04 DIAGNOSIS — I10 Essential (primary) hypertension: Secondary | ICD-10-CM | POA: Diagnosis not present

## 2018-05-04 DIAGNOSIS — R002 Palpitations: Secondary | ICD-10-CM

## 2018-05-04 DIAGNOSIS — F419 Anxiety disorder, unspecified: Secondary | ICD-10-CM

## 2018-05-04 DIAGNOSIS — K449 Diaphragmatic hernia without obstruction or gangrene: Secondary | ICD-10-CM

## 2018-05-04 DIAGNOSIS — E669 Obesity, unspecified: Secondary | ICD-10-CM

## 2018-05-04 DIAGNOSIS — F329 Major depressive disorder, single episode, unspecified: Secondary | ICD-10-CM

## 2018-05-04 DIAGNOSIS — H00014 Hordeolum externum left upper eyelid: Secondary | ICD-10-CM

## 2018-05-04 DIAGNOSIS — E1169 Type 2 diabetes mellitus with other specified complication: Secondary | ICD-10-CM

## 2018-05-04 MED ORDER — FLUOXETINE HCL 20 MG PO TABS: 60.0000 mg | ORAL_TABLET | Freq: Every day | ORAL | 3 refills | Status: DC

## 2018-05-04 MED ORDER — CLINDAMYCIN PHOSPHATE 100 MG VA SUPP: 100.0000 mg | Freq: Every day | VAGINAL | 0 refills | Status: DC

## 2018-05-04 MED ORDER — FLUCONAZOLE 150 MG PO TABS: 150.0000 mg | ORAL_TABLET | ORAL | 0 refills | Status: DC

## 2018-05-04 NOTE — Assessment & Plan Note (Signed)
Poorly controlled will alter medications, encouraged DASH diet, minimize caffeine and obtain adequate sleep. Report concerning symptoms and follow up as directed and as needed. Add Losartan 25 mg po daily

## 2018-05-04 NOTE — Assessment & Plan Note (Signed)
Encouraged heart healthy diet, increase exercise, avoid trans fats, consider a krill oil cap daily 

## 2018-05-04 NOTE — Assessment & Plan Note (Signed)
hgba1c acceptable, minimize simple carbs. Increase exercise as tolerated. Continue current meds 

## 2018-05-04 NOTE — Assessment & Plan Note (Signed)
Improved with beta blockade is following with cardiology

## 2018-05-04 NOTE — Assessment & Plan Note (Signed)
Avoid offending foods, start probiotics. Do not eat large meals in late evening and consider raising head of bed.  

## 2018-05-04 NOTE — Patient Instructions (Signed)

## 2018-05-08 LAB — PAIN MGMT, PROFILE 8 W/CONF, U
6 Acetylmorphine: NEGATIVE ng/mL (ref <10)
ALCOHOL METABOLITES: NEGATIVE ng/mL (ref <500)
Amphetamines: NEGATIVE ng/mL (ref <500)
BENZODIAZEPINES: NEGATIVE ng/mL (ref <100)
BUPRENORPHINE, URINE: NEGATIVE ng/mL (ref <5)
CODEINE: NEGATIVE ng/mL (ref <50)
Cocaine Metabolite: NEGATIVE ng/mL (ref <150)
Creatinine: 85.5 mg/dL
HYDROMORPHONE: NEGATIVE ng/mL (ref <50)
Hydrocodone: 197 ng/mL — ABNORMAL HIGH (ref <50)
MDMA: NEGATIVE ng/mL (ref <500)
Marijuana Metabolite: NEGATIVE ng/mL (ref <20)
Morphine: NEGATIVE ng/mL (ref <50)
Norhydrocodone: 429 ng/mL — ABNORMAL HIGH (ref <50)
OPIATES: POSITIVE ng/mL — AB (ref <100)
OXIDANT: NEGATIVE ug/mL (ref <200)
Oxycodone: NEGATIVE ng/mL (ref <100)
PH: 6.66 (ref 4.5–9.0)

## 2018-05-08 LAB — URINE CYTOLOGY ANCILLARY ONLY
BACTERIAL VAGINITIS: NEGATIVE
CANDIDA VAGINITIS: NEGATIVE

## 2018-05-09 ENCOUNTER — Other Ambulatory Visit: Payer: Self-pay | Admitting: Family Medicine

## 2018-05-09 DIAGNOSIS — I10 Essential (primary) hypertension: Secondary | ICD-10-CM

## 2018-05-09 DIAGNOSIS — E785 Hyperlipidemia, unspecified: Secondary | ICD-10-CM

## 2018-05-09 DIAGNOSIS — R002 Palpitations: Secondary | ICD-10-CM

## 2018-05-09 DIAGNOSIS — H00019 Hordeolum externum unspecified eye, unspecified eyelid: Secondary | ICD-10-CM | POA: Insufficient documentation

## 2018-05-09 NOTE — Assessment & Plan Note (Signed)
Improving but Persistent, try host compresses and eye drops as needed. Did treatment which was helpful

## 2018-05-09 NOTE — Assessment & Plan Note (Signed)
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 No changes in meds for now. Consider counseling.

## 2018-05-09 NOTE — Assessment & Plan Note (Signed)
Has struggled with this intermittently and her symptoms are inceasing. Check ancillary testing and try Cleocin suppositories

## 2018-05-09 NOTE — Progress Notes (Signed)
Subjective:    Patient ID: Nicole Ball, female    DOB: 1976/05/19, 42 y.o.   MRN: [id number]  No chief complaint on file.   HPI Patient is in today for follow up. 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 It is stressful as a new job but she enjoys it. She has been struggling with a stye in her left eye but it is improving. No recent febrile illness or hospitalizations. No polyuria or polydipsia. Denies CP/palp/SOB/HA/congestion/fevers/GI or GU c/o. Taking meds as prescribed  Past Medical History:  Diagnosis Date  . Abnormal serum level of alkaline phosphatase 05/21/2014  . Acute low back pain 12/19/2010  . ALLERGIC RHINITIS 10/16/2010  . Allergy    seasonal  . Anxiety and depression 06/07/2011  . Atrophic vaginitis 08/17/2016  . Cervical cancer screening 01/10/2014   Menarche at 8 Regular and moderate flow, became irregular until OCPs  history of abnormal pap in past, repeat was normal Last pap roughly 3 years ago G0P0, s/p No history of abnormal MGM, no h/o MGM  No concerns today no gyn surgeries LMP May 2013  . CTS (carpal tunnel syndrome) 01/15/2014  . Diabetes mellitus type 2 in obese (HCC) 10/30/2011  . Dry eyes 08/17/2016  . Early menopause   . Elevated BP 04/21/2011  . Essential hypertension   . HIATAL HERNIA WITH REFLUX, HX OF 10/16/2010  . Hyperlipidemia 04/21/2011  . Inappropriate sinus tachycardia   . INSOMNIA 10/16/2010  . Knee pain, left 03/11/2012  . Migraine 03/07/2013  . Obesity   . Obesity (BMI 30-39.9) 01/12/2018  . Palpitations 01/28/2015  . Reflux 04/22/2012  . Sleep apnea 06/07/2011  . Tobacco use disorder 02/10/2017  . Urinary urgency 08/17/2016  . UTI'S, HX OF 10/16/2010    Past Surgical History:  Procedure Laterality Date  . CHOLECYSTECTOMY      Family History  Problem Relation Age of Onset  . Lupus Mother   . Arthritis Maternal Grandmother   . Scleroderma Maternal Grandmother   . Glaucoma Maternal Grandmother   . Cataracts  Maternal Grandmother   . Cancer Maternal Grandfather        prostate  . Coronary artery disease Maternal Grandfather        s/p angioplasty  . Diabetes Maternal Grandfather        Type 2  . Cancer Paternal Grandmother        Breast ca- dx in 27's  . Thyroid disease Paternal Grandmother   . Breast cancer Paternal Grandmother   . Diabetes Paternal Grandfather   . Thyroid disease Sister   . Asthma Sister   . Allergies Brother   . Cancer Father        cigarette, alcohol HEENT squamous cell  . Arthritis Other     Social History   Socioeconomic History  . Marital status: Married    Spouse name: Not on file  . Number of children: Not on file  . Years of education: Not on file  . Highest education level: Not on file  Occupational History  . Not on file  Social Needs  . Financial resource strain: Not on file  . Food insecurity:    Worry: Not on file    Inability: Not on file  . Transportation needs:    Medical: Not on file    Non-medical: Not on file  Tobacco Use  . Smoking status: Current Some Day Smoker    Packs/day: 0.10  Types: Cigars  . Smokeless tobacco: Never Used  . Tobacco comment: 1 black and milds  Substance and Sexual Activity  . Alcohol use: Yes    Comment: twice/year  . Drug use: No  . Sexual activity: Not on file  Lifestyle  . Physical activity:    Days per week: Not on file    Minutes per session: Not on file  . Stress: Not on file  Relationships  . Social connections:    Talks on phone: Not on file    Gets together: Not on file    Attends religious service: Not on file    Active member of club or organization: Not on file    Attends meetings of clubs or organizations: Not on file    Relationship status: Not on file  . Intimate partner violence:    Fear of current or ex partner: Not on file    Emotionally abused: Not on file    Physically abused: Not on file    Forced sexual activity: Not on file  Other Topics Concern  . Not on file    Social History Narrative   Married lives w/ husband, works at [employer]    Outpatient Medications Prior to Visit  Medication Sig Dispense Refill  . ACETAMINOPHEN-BUTALBITAL 50-325 MG TABS Take 1 tablet by mouth 2 (two) times daily as needed (HEADACHES). 120 each 0  . amitriptyline (ELAVIL) 10 MG tablet TAKE 1 TABLET(10 MG) BY MOUTH AT BEDTIME 90 tablet 2  . atorvastatin (LIPITOR) 10 MG tablet TAKE 1 TABLET(10 MG) BY MOUTH DAILY 90 tablet 0  . BAYER MICROLET LANCETS lancets Use as directed once daily to check blood sugar.   DX E11.9 100 each 1  . Blood Glucose Monitoring Suppl (CONTOUR BLOOD GLUCOSE SYSTEM) DEVI Use daily to check blood sugar. DX E11.9 1 Device 0  . busPIRone (BUSPAR) 10 MG tablet Take 0.5-1 tablets (5-10 mg total) by mouth 3 (three) times daily. 90 tablet 2  . conjugated estrogens (PREMARIN) vaginal cream Place 1 Applicatorful vaginally once a week. 42.5 g 121  . CONTOUR NEXT TEST test strip USE TO CHECK BLOOD SUGAR DAILY 100 each 2  . cyclobenzaprine (FLEXERIL) 10 MG tablet TAKE 1 TABLET(10 MG) BY MOUTH TWICE DAILY AS NEEDED FOR MUSCLE SPASMS 60 tablet 0  . fluticasone (FLONASE) 50 MCG/ACT nasal spray Place 2 sprays into both nostrils daily. 16 g 6  . HYDROcodone-acetaminophen (NORCO/VICODIN) 5-325 MG tablet Take 1 tablet by mouth every 6 (six) hours as needed. Patient needs appointment for further refills. 100 tablet 0  . meloxicam (MOBIC) 15 MG tablet Take 1 tablet (15 mg total) by mouth daily as needed for pain. 90 tablet 1  . metFORMIN (GLUCOPHAGE) 500 MG tablet TAKE 2 TABLETS BY MOUTH TWICE DAILY 360 tablet 1  . metroNIDAZOLE (FLAGYL) 500 MG tablet Take 1 tablet (500 mg total) by mouth 3 (three) times daily. 21 tablet 0  . montelukast (SINGULAIR) 10 MG tablet TAKE 1 TABLET(10 MG) BY MOUTH AT BEDTIME 90 tablet 1  . nystatin cream (MYCOSTATIN) Apply 1 application topically 2 (two) times daily. 30 g 2  . omeprazole (PRILOSEC) 20 MG capsule TAKE 1 CAPSULE(20 MG) BY  MOUTH TWICE DAILY BEFORE A MEAL 180 capsule 0  . promethazine (PHENERGAN) 25 MG tablet Take 1 tablet (25 mg total) by mouth every 8 (eight) hours as needed for nausea or vomiting. 20 tablet 0  . propranolol ER (INDERAL LA) 60 MG 24 hr capsule Take 2 capsules (  120 mg total) by mouth daily. 180 capsule 3  . ranitidine (ZANTAC) 300 MG capsule Take 1 capsule (300 mg total) by mouth every evening. 30 capsule 3  . Saline 0.65 % (SOLN) SOLN Place 2 Squirts into the nose 2 (two) times daily. (Patient taking differently: Place 2 Squirts into the nose 3 (three) times a week. ) 1 Bottle 0  . SUMAtriptan (IMITREX) 50 MG tablet Take 1 tablet (50 mg total) by mouth every 2 (two) hours as needed for migraine. May repeat in 2 hours if headache persists or recurs. 10 tablet 3  . fluconazole (DIFLUCAN) 150 MG tablet Take 1 tablet (150 mg total) by mouth once a week. 3 tablet 0  . FLUoxetine (PROZAC) 40 MG capsule TAKE 1 CAPSULE(40 MG) BY MOUTH DAILY 90 capsule 0   No facility-administered medications prior to visit.     No Known Allergies  Review of Systems  Constitutional: Positive for malaise/fatigue. Negative for fever.  HENT: Negative for congestion.   Eyes: Negative for blurred vision.  Respiratory: Negative for shortness of breath.   Cardiovascular: Negative for chest pain, palpitations and leg swelling.  Gastrointestinal: Negative for abdominal pain, blood in stool and nausea.  Genitourinary: Negative for dysuria and frequency.  Musculoskeletal: Negative for falls.  Skin: Negative for rash.  Neurological: Negative for dizziness, loss of consciousness and headaches.  Endo/Heme/Allergies: Negative for environmental allergies.  Psychiatric/Behavioral: Positive for depression. The patient is nervous/anxious.        Objective:    Physical Exam  Constitutional: She is oriented to person, place, and time. She appears well-developed and well-nourished. No distress.  HENT:  Head: Normocephalic and  atraumatic.  Nose: Nose normal.  Eyes: Right eye exhibits no discharge. Left eye exhibits no discharge.  Neck: Normal range of motion. Neck supple.  Cardiovascular: Normal rate and regular rhythm.  No murmur heard. Pulmonary/Chest: Effort normal and breath sounds normal.  Abdominal: Soft. Bowel sounds are normal. There is no tenderness.  Musculoskeletal: She exhibits no edema.  Neurological: She is alert and oriented to person, place, and time.  Skin: Skin is warm and dry.  Psychiatric: She has a normal mood and affect.  Nursing note and vitals reviewed.   BP (!) 142/100 (BP Location: Left Arm, Patient Position: Sitting, Cuff Size: Normal)   Pulse 93   Temp 97.6 F (36.4 C) (Oral)   Resp 18   Wt 194 lb 6.4 oz (88.2 kg)   LMP 01/08/2013   SpO2 98%   BMI 33.90 kg/m  Wt Readings from Last 3 Encounters:  05/04/18 194 lb 6.4 oz (88.2 kg)  02/09/18 190 lb (86.2 kg)  01/14/18 192 lb 12.8 oz (87.5 kg)     Lab Results  Component Value Date   WBC 7.2 01/14/2018   HGB 13.2 01/14/2018   HCT 41.0 01/14/2018   PLT 200.0 01/14/2018   GLUCOSE 93 01/14/2018   CHOL 159 01/14/2018   TRIG 155.0 (H) 01/14/2018   HDL 67.60 01/14/2018   LDLCALC 61 01/14/2018   ALT 16 01/14/2018   AST 11 01/14/2018   NA 137 01/14/2018   K 3.9 01/14/2018   CL 102 01/14/2018   CREATININE 0.68 01/14/2018   BUN 11 01/14/2018   CO2 22 01/14/2018   TSH 1.29 01/14/2018   HGBA1C 8.0 (H) 01/14/2018   MICROALBUR <0.7 09/17/2015    Lab Results  Component Value Date   TSH 1.29 01/14/2018   Lab Results  Component Value Date   WBC 7.2 01/14/2018  HGB 13.2 01/14/2018   HCT 41.0 01/14/2018   MCV 88.8 01/14/2018   PLT 200.0 01/14/2018   Lab Results  Component Value Date   NA 137 01/14/2018   K 3.9 01/14/2018   CO2 22 01/14/2018   GLUCOSE 93 01/14/2018   BUN 11 01/14/2018   CREATININE 0.68 01/14/2018   BILITOT 0.2 01/14/2018   ALKPHOS 143 (H) 01/14/2018   AST 11 01/14/2018   ALT 16 01/14/2018    PROT 7.4 01/14/2018   ALBUMIN 4.3 01/14/2018   CALCIUM 10.1 01/14/2018   GFR 121.09 01/14/2018   Lab Results  Component Value Date   CHOL 159 01/14/2018   Lab Results  Component Value Date   HDL 67.60 01/14/2018   Lab Results  Component Value Date   LDLCALC 61 01/14/2018   Lab Results  Component Value Date   TRIG 155.0 (H) 01/14/2018   Lab Results  Component Value Date   CHOLHDL 2 01/14/2018   Lab Results  Component Value Date   HGBA1C 8.0 (H) 01/14/2018       Assessment & Plan:   Problem List Items Addressed This Visit    Hiatal hernia with gastroesophageal reflux    Avoid offending foods, start probiotics. Do not eat large meals in late evening and consider raising head of bed.       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 No changes in meds for now. Consider counseling.       Relevant Medications   FLUoxetine (PROZAC) 20 MG tablet   Diabetes mellitus type 2 in obese (HCC)    hgba1c acceptable, minimize simple carbs. Increase exercise as tolerated. Continue current meds      Relevant Orders   Hemoglobin A1c   HTN (hypertension)    Poorly controlled will alter medications, encouraged DASH diet, minimize caffeine and obtain adequate sleep. Report concerning symptoms and follow up as directed and as needed. Add Losartan 25 mg po daily      Relevant Orders   CBC   Comprehensive metabolic panel   TSH   Urine Microalbumin w/creat. ratio   Heart palpitations    Improved with beta blockade is following with cardiology      Acute vaginitis    Has struggled with this intermittently and her symptoms are inceasing. Check ancillary testing and try Cleocin suppositories       Relevant Orders   Urine cytology ancillary only  (Completed)   Stye    Improving but Persistent, try host compresses and eye drops as needed. Did treatment which was helpful       Other Visit Diagnoses    High risk medication use    -  Primary   Relevant Orders   Pain Mgmt, Profile 8 w/Conf, U (Completed)      I have discontinued Lonisha Z. Lablanc's FLUoxetine. I am also having her start on FLUoxetine and clindamycin. Additionally, I am having her maintain her Saline, BAYER MICROLET LANCETS, fluticasone, CONTOUR NEXT TEST, nystatin cream, ACETAMINOPHEN-BUTALBITAL, ranitidine, CONTOUR BLOOD GLUCOSE SYSTEM, conjugated estrogens, cyclobenzaprine, promethazine, atorvastatin, meloxicam, SUMAtriptan, busPIRone, montelukast, metFORMIN, omeprazole, propranolol ER, metroNIDAZOLE, amitriptyline, HYDROcodone-acetaminophen, and fluconazole.  Meds ordered this encounter  Medications  . fluconazole (DIFLUCAN) 150 MG tablet  Sig: Take 1 tablet (150 mg total) by mouth once a week.    Dispense:  3 tablet    Refill:  0  . FLUoxetine (PROZAC) 20 MG tablet    Sig: Take 3 tablets (60 mg total) by mouth daily.    Dispense:  90 tablet    Refill:  3  . clindamycin (CLEOCIN) 100 MG vaginal suppository    Sig: Place 1 suppository (100 mg total) vaginally at bedtime.    Dispense:  3 suppository    Refill:  0     Danise Edge, MD

## 2018-05-13 ENCOUNTER — Other Ambulatory Visit: Payer: Self-pay | Admitting: Family Medicine

## 2018-05-13 ENCOUNTER — Telehealth: Payer: Self-pay

## 2018-05-13 MED ORDER — LOSARTAN POTASSIUM 25 MG PO TABS: 25.0000 mg | ORAL_TABLET | Freq: Every day | ORAL | 3 refills | Status: DC

## 2018-05-13 NOTE — Telephone Encounter (Signed)
Copied from CRM [id number]. Topic: General - Other >> May 12, 2018  5:43 PM Debroah Loop wrote: Reason for CRM: Patient needs blood pressure medicine that Dr. Rogelia Rohrer said she would write in addition to what shes already taking. Uses [id number] Ginette Otto, [place] - [address] AT [facility] [address] [place] [address] Phone: [phone] Fax: [phone]   Dr. Abner Greenspan please advise on medication and I will rx to pharmacy

## 2018-05-13 NOTE — Telephone Encounter (Signed)
Please let her know I sent in Losartan 25 mg po daily

## 2018-05-14 NOTE — Telephone Encounter (Signed)
Patient made aware.

## 2018-05-24 ENCOUNTER — Ambulatory Visit: Payer: BC Managed Care – PPO | Admitting: Cardiology

## 2018-06-07 ENCOUNTER — Other Ambulatory Visit: Payer: Self-pay | Admitting: Family Medicine

## 2018-06-07 DIAGNOSIS — M25562 Pain in left knee: Secondary | ICD-10-CM

## 2018-06-07 MED ORDER — HYDROCODONE-ACETAMINOPHEN 5-325 MG PO TABS: 1.0000 | ORAL_TABLET | Freq: Four times a day (QID) | ORAL | 0 refills | Status: DC | PRN

## 2018-06-07 NOTE — Telephone Encounter (Signed)
Last hydrocodone RX: 04/21/18, #100 Last OV: 05/04/18 Next OV: not schedule but due by 09/03/18 UDS: 05/04/18, moderate repeat 10/2018 CSC: not signed but due at next OV CSR: No discrepancies identified

## 2018-06-15 ENCOUNTER — Other Ambulatory Visit: Payer: Self-pay | Admitting: Family Medicine

## 2018-06-21 ENCOUNTER — Other Ambulatory Visit: Payer: Self-pay | Admitting: Family Medicine

## 2018-07-12 ENCOUNTER — Other Ambulatory Visit: Payer: Self-pay | Admitting: Family Medicine

## 2018-07-26 ENCOUNTER — Other Ambulatory Visit: Payer: Self-pay | Admitting: Family Medicine

## 2018-07-26 DIAGNOSIS — M25562 Pain in left knee: Secondary | ICD-10-CM

## 2018-07-26 MED ORDER — HYDROCODONE-ACETAMINOPHEN 5-325 MG PO TABS: 1.0000 | ORAL_TABLET | Freq: Four times a day (QID) | ORAL | 0 refills | Status: DC | PRN

## 2018-07-26 MED ORDER — PROMETHAZINE HCL 25 MG PO TABS: 25.0000 mg | ORAL_TABLET | Freq: Three times a day (TID) | ORAL | 0 refills | Status: DC | PRN

## 2018-08-03 ENCOUNTER — Ambulatory Visit (HOSPITAL_BASED_OUTPATIENT_CLINIC_OR_DEPARTMENT_OTHER)
Admission: RE | Admit: 2018-08-03 | Discharge: 2018-08-03 | Disposition: A | Discharge status: Home / Self Care | Payer: BC Managed Care – PPO | Source: Ambulatory Visit | Attending: Medical | Admitting: Medical

## 2018-08-03 ENCOUNTER — Encounter: Payer: Self-pay | Admitting: Medical

## 2018-08-03 ENCOUNTER — Ambulatory Visit: Payer: BC Managed Care – PPO | Admitting: Medical

## 2018-08-03 VITALS — BP 145/92 | HR 84 | Temp 98.2°F | Resp 16 | Ht 63.5 in | Wt 187.0 lb

## 2018-08-03 DIAGNOSIS — G8929 Other chronic pain: Secondary | ICD-10-CM

## 2018-08-03 DIAGNOSIS — E1169 Type 2 diabetes mellitus with other specified complication: Secondary | ICD-10-CM

## 2018-08-03 DIAGNOSIS — E669 Obesity, unspecified: Secondary | ICD-10-CM

## 2018-08-03 DIAGNOSIS — M5441 Lumbago with sciatica, right side: Secondary | ICD-10-CM

## 2018-08-03 DIAGNOSIS — M25551 Pain in right hip: Secondary | ICD-10-CM | POA: Insufficient documentation

## 2018-08-03 NOTE — Patient Instructions (Signed)
You have a history of chronic intermittent low back pain with some sciatica features.  Also some right hip pain recently.  Want to get lumbar spine x-ray today as well as her right hip x-ray.  We gave you Toradol 30 mg IM injection today.  Starting tomorrow you can use meloxicam 7.5 mg.  Continue with Flexeril but I usually recommend just using a muscle relaxant at night.  You have Norco for breakthrough pain.  If you have any severe pain with leg weakness or loss of bladder function then recommend ED evaluation.  Your blood pressure is high today.  It is very important that you take your blood pressure medication daily.  Particularly if you are using NSAIDs as that can increase your blood pressure slightly.  Please take your diabetic medications.  Eat low sugar diet.  Will follow your x-ray results.  We will notify you.  Might consider referral to sports medicine or orthopedist.  Follow-up in 10 to 14 days or as needed.

## 2018-08-03 NOTE — Progress Notes (Signed)
Subjective:    Patient ID: Nicole Ball, female    DOB: [birth year], 42 y.o.   MRN: [id number]  HPI  Pt in for some rt side hip pain with some pain seems to radiating down leg. At times her leg on rt side feels tingly. Pt rt hip seems to be source of pain. But she does have some rt side lower back pain as well. Hx of intermittent low back pain for years.  Pt states pain is worse when she stands up.  She states rt hip pain for about one year.  Her back pain on and off for more than a year.   Pt is not on atorvastain.  Pt bp is elevated today. She is not on her bp medication. She admits on and off compliance. Recently very sporadic. No cardiac or neurologic signs or symptoms.   Pt has hydrocodone for her back. She also has flexeril. Pt also has mobic 15 mg.(none used today)    Review of Systems  Constitutional: Negative for chills and fever.  Respiratory: Negative for cough, choking, shortness of breath and wheezing.   Cardiovascular: Negative for chest pain and palpitations.  Gastrointestinal: Negative for abdominal pain.  Musculoskeletal: Positive for back pain.       Hip pain.  Neurological: Negative for dizziness, facial asymmetry and headaches.  Hematological: Negative for adenopathy. Does not bruise/bleed easily.  Psychiatric/Behavioral: Negative for behavioral problems.    Past Medical History:  Diagnosis Date  . Abnormal serum level of alkaline phosphatase 05/21/2014  . Acute low back pain 12/19/2010  . ALLERGIC RHINITIS 10/16/2010  . Allergy    seasonal  . Anxiety and depression 06/07/2011  . Atrophic vaginitis 08/17/2016  . Cervical cancer screening 01/10/2014   Menarche at 8 Regular and moderate flow, became irregular until OCPs  history of abnormal pap in past, repeat was normal Last pap roughly 3 years ago G0P0, s/p No history of abnormal MGM, no h/o MGM  No concerns today no gyn surgeries LMP May 2013  . CTS (carpal tunnel syndrome) 01/15/2014  . Diabetes  mellitus type 2 in obese ([facility]) 10/30/2011  . Dry eyes 08/17/2016  . Early menopause   . Elevated BP 04/21/2011  . Essential hypertension   . HIATAL HERNIA WITH REFLUX, HX OF 10/16/2010  . Hyperlipidemia 04/21/2011  . Inappropriate sinus tachycardia   . INSOMNIA 10/16/2010  . Knee pain, left 03/11/2012  . Migraine 03/07/2013  . Obesity   . Obesity (BMI 30-39.9) 01/12/2018  . Palpitations 01/28/2015  . Reflux 04/22/2012  . Sleep apnea 06/07/2011  . Tobacco use disorder 02/10/2017  . Urinary urgency 08/17/2016  . UTI'S, HX OF 10/16/2010     Social History   Socioeconomic History  . Marital status: Married    Spouse name: Not on file  . Number of children: Not on file  . Years of education: Not on file  . Highest education level: Not on file  Occupational History  . Not on file  Social Needs  . Financial resource strain: Not on file  . Food insecurity:    Worry: Not on file    Inability: Not on file  . Transportation needs:    Medical: Not on file    Non-medical: Not on file  Tobacco Use  . Smoking status: Current Some Day Smoker    Packs/day: 0.10    Types: Cigars  . Smokeless tobacco: Never Used  . Tobacco comment: 1 black and milds  Substance and Sexual Activity  .  Alcohol use: Yes    Comment: twice/year  . Drug use: No  . Sexual activity: Not on file  Lifestyle  . Physical activity:    Days per week: Not on file    Minutes per session: Not on file  . Stress: Not on file  Relationships  . Social connections:    Talks on phone: Not on file    Gets together: Not on file    Attends religious service: Not on file    Active member of club or organization: Not on file    Attends meetings of clubs or organizations: Not on file    Relationship status: Not on file  . Intimate partner violence:    Fear of current or ex partner: Not on file    Emotionally abused: Not on file    Physically abused: Not on file    Forced sexual activity: Not on file  Other Topics Concern  . Not on  file  Social History Narrative   Married lives w/ husband, works at [employer]    Past Surgical History:  Procedure Laterality Date  . CHOLECYSTECTOMY      Family History  Problem Relation Age of Onset  . Lupus Mother   . Arthritis Maternal Grandmother   . Scleroderma Maternal Grandmother   . Glaucoma Maternal Grandmother   . Cataracts Maternal Grandmother   . Cancer Maternal Grandfather        prostate  . Coronary artery disease Maternal Grandfather        s/p angioplasty  . Diabetes Maternal Grandfather        Type 2  . Cancer Paternal Grandmother        Breast ca- dx in 5050's  . Thyroid disease Paternal Grandmother   . Breast cancer Paternal Grandmother   . Diabetes Paternal Grandfather   . Thyroid disease Sister   . Asthma Sister   . Allergies Brother   . Cancer Father        cigarette, alcohol HEENT squamous cell  . Arthritis Other     No Known Allergies  Current Outpatient Medications on File Prior to Visit  Medication Sig Dispense Refill  . ACETAMINOPHEN-BUTALBITAL 50-325 MG TABS Take 1 tablet by mouth 2 (two) times daily as needed (HEADACHES). 120 each 0  . amitriptyline (ELAVIL) 10 MG tablet TAKE 1 TABLET(10 MG) BY MOUTH AT BEDTIME 90 tablet 2  . atorvastatin (LIPITOR) 10 MG tablet TAKE 1 TABLET(10 MG) BY MOUTH DAILY 90 tablet 0  . BAYER MICROLET LANCETS lancets Use as directed once daily to check blood sugar.   DX E11.9 100 each 1  . Blood Glucose Monitoring Suppl (CONTOUR BLOOD GLUCOSE SYSTEM) DEVI Use daily to check blood sugar. DX E11.9 1 Device 0  . busPIRone (BUSPAR) 10 MG tablet Take 0.5-1 tablets (5-10 mg total) by mouth 3 (three) times daily. 90 tablet 2  . clindamycin (CLEOCIN) 100 MG vaginal suppository Place 1 suppository (100 mg total) vaginally at bedtime. 3 suppository 0  . conjugated estrogens (PREMARIN) vaginal cream Place 1 Applicatorful vaginally once a week. 42.5 g 121  . CONTOUR NEXT TEST test strip USE TO CHECK BLOOD SUGAR DAILY 100  each 2  . cyclobenzaprine (FLEXERIL) 10 MG tablet TAKE 1 TABLET(10 MG) BY MOUTH TWICE DAILY AS NEEDED FOR MUSCLE SPASMS 60 tablet 0  . fluconazole (DIFLUCAN) 150 MG tablet Take 1 tablet (150 mg total) by mouth once a week. 3 tablet 0  . FLUoxetine (PROZAC) 20 MG tablet Take  3 tablets (60 mg total) by mouth daily. 90 tablet 3  . fluticasone (FLONASE) 50 MCG/ACT nasal spray Place 2 sprays into both nostrils daily. 16 g 6  . HYDROcodone-acetaminophen (NORCO/VICODIN) 5-325 MG tablet Take 1 tablet by mouth every 6 (six) hours as needed. Patient needs appointment for further refills. 100 tablet 0  . losartan (COZAAR) 25 MG tablet Take 1 tablet (25 mg total) by mouth daily. 30 tablet 3  . meloxicam (MOBIC) 15 MG tablet Take 1 tablet (15 mg total) by mouth daily as needed for pain. 90 tablet 1  . metFORMIN (GLUCOPHAGE) 500 MG tablet TAKE 2 TABLETS BY MOUTH TWICE DAILY 360 tablet 1  . metroNIDAZOLE (FLAGYL) 500 MG tablet Take 1 tablet (500 mg total) by mouth 3 (three) times daily. 21 tablet 0  . montelukast (SINGULAIR) 10 MG tablet TAKE 1 TABLET(10 MG) BY MOUTH AT BEDTIME 90 tablet 1  . nystatin cream (MYCOSTATIN) Apply 1 application topically 2 (two) times daily. 30 g 2  . omeprazole (PRILOSEC) 20 MG capsule Take 1 capsule (20 mg total) by mouth 2 (two) times daily before a meal. 180 capsule 3  . promethazine (PHENERGAN) 25 MG tablet Take 1 tablet (25 mg total) by mouth every 8 (eight) hours as needed for nausea or vomiting. 20 tablet 0  . propranolol ER (INDERAL LA) 60 MG 24 hr capsule Take 2 capsules (120 mg total) by mouth daily. 180 capsule 3  . ranitidine (ZANTAC) 300 MG capsule TAKE 1 CAPSULE(300 MG) BY MOUTH EVERY EVENING 30 capsule 0  . Saline 0.65 % (SOLN) SOLN Place 2 Squirts into the nose 2 (two) times daily. (Patient taking differently: Place 2 Squirts into the nose 3 (three) times a week. ) 1 Bottle 0  . SUMAtriptan (IMITREX) 50 MG tablet Take 1 tablet (50 mg total) by mouth every 2 (two) hours  as needed for migraine. May repeat in 2 hours if headache persists or recurs. 10 tablet 3   No current facility-administered medications on file prior to visit.     BP (!) 145/92   Pulse 84   Temp 98.2 F (36.8 C) (Oral)   Resp 16   Ht 5' 3.5" (1.613 m)   Wt 187 lb (84.8 kg)   LMP 01/08/2013   SpO2 100%   BMI 32.61 kg/m       Objective:   Physical Exam  General Appearance- Not in acute distress.    Chest and Lung Exam Auscultation: Breath sounds:-Normal. Clear even and unlabored. Adventitious sounds:- No Adventitious sounds.  Cardiovascular Auscultation:Rythm - Regular, rate and rythm. Heart Sounds -Normal heart sounds.  Abdomen Inspection:-Inspection Normal.  Palpation/Perucssion: Palpation and Percussion of the abdomen reveal- Non Tender, No Rebound tenderness, No rigidity(Guarding) and No Palpable abdominal masses.  Liver:-Normal.  Spleen:- Normal.   Back Mid lumbar spine tenderness to palpation. And rt si tenderness. Pain on straight leg lift. Pain on lateral movements and flexion/extension of the spine.  Lower ext neurologic  L5-S1 sensation intact bilaterally. Normal patellar reflexes bilaterally. No foot drop bilaterally.  Rt hip- mild pain on palpation and moderate pain on rom.      Assessment & Plan:  You have a history of chronic intermittent low back pain with some sciatica features.  Also some right hip pain recently.  Want to get lumbar spine x-ray today as well as her right hip x-ray.  We gave you Toradol 30 mg IM injection today.  Starting tomorrow you can use meloxicam 7.5 mg.  Continue  with Flexeril but I usually recommend just using a muscle relaxant at night.  You have Norco for breakthrough pain.  If you have any severe pain with leg weakness or loss of bladder function then recommend ED evaluation.  Your blood pressure is high today.  It is very important that you take your blood pressure medication daily.  Particularly if you are using  NSAIDs as that can increase your blood pressure slightly.  Please take your diabetic medications.  Eat low sugar diet.  Will follow your x-ray results.  We will notify you.  Might consider referral to sports medicine or orthopedist.  Follow-up in 10 to 14 days or as needed.  Esperanza Richters, PA-C

## 2018-08-07 ENCOUNTER — Other Ambulatory Visit: Payer: Self-pay | Admitting: Family Medicine

## 2018-08-08 ENCOUNTER — Other Ambulatory Visit: Payer: Self-pay | Admitting: Family Medicine

## 2018-08-20 ENCOUNTER — Encounter: Payer: Self-pay | Admitting: Cardiology

## 2018-08-20 ENCOUNTER — Ambulatory Visit (INDEPENDENT_AMBULATORY_CARE_PROVIDER_SITE_OTHER): Payer: BC Managed Care – PPO | Admitting: Cardiology

## 2018-08-20 VITALS — BP 136/94 | HR 110 | Ht 63.5 in | Wt 190.4 lb

## 2018-08-20 DIAGNOSIS — I10 Essential (primary) hypertension: Secondary | ICD-10-CM | POA: Diagnosis not present

## 2018-08-20 DIAGNOSIS — E782 Mixed hyperlipidemia: Secondary | ICD-10-CM

## 2018-08-20 DIAGNOSIS — R000 Tachycardia, unspecified: Secondary | ICD-10-CM | POA: Diagnosis not present

## 2018-08-20 MED ORDER — LOSARTAN POTASSIUM 50 MG PO TABS: 50.0000 mg | ORAL_TABLET | Freq: Every day | ORAL | 2 refills | Status: DC

## 2018-08-20 MED ORDER — ROSUVASTATIN CALCIUM 5 MG PO TABS: 5.0000 mg | ORAL_TABLET | ORAL | 1 refills | Status: DC

## 2018-08-20 NOTE — Progress Notes (Signed)
Cardiology Office Note:    Date:  08/20/2018   ID:  Nicole Ball, DOB August 20, 1975, MRN [id number]  PCP:  Mosie Lukes, MD  Cardiologist:  Ena Dawley, MD  Electrophysiologist:  None   Referring MD: Mosie Lukes, MD   Reason for visit: Follow-up for hypertension hyperlipidemia and palpitations  History of Present Illness:    Nicole Ball is a 43 y.o. female with a hx of h/o DM 2 (for the last 2-3 years with PCOS and early menopause), HTN, anxiety who has noticed palpitations and tachycardia in March 2016, she was started on metoprolol and her symptoms improved however she continues to be tachycardic. They are very uncomfortable, but no dizziness or syncope.  She was recently diagnosed with fatty liver and gallbladder stone and she is supposed to see a surgeon the next week.  She is undergoing significant stress with her husband who is also our patient requires myectomy for HCM. She has noticed worsening DOE. She also has non-exertional chest pain mostly after food that is most probably related to gallstones. No syncope.  H/O MI in grandparents.   08/20/2018 -the patient is coming after 3 years, in a interim she was seen by Dr. Radford Pax for sleep apnea and is using CPAP machine that is helping significantly.  She was also seen by Dr. Caryl Comes who diagnosed her with inappropriate sinus tachycardia secondary to stress and increase the dose of her propranolol. She states that lately she has occasional palpitations few times a week they are not associated with other symptoms such as presyncope or syncope.  She stopped taking atorvastatin as she developed significant myalgias.  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  She feels that 1 of her medicines is changing texture of her hair.  Past Medical History:  Diagnosis Date  . Abnormal serum level of alkaline phosphatase 05/21/2014  . Acute  low back pain 12/19/2010  . ALLERGIC RHINITIS 10/16/2010  . Allergy    seasonal  . Anxiety and depression 06/07/2011  . Atrophic vaginitis 08/17/2016  . Cervical cancer screening 01/10/2014   Menarche at 8 Regular and moderate flow, became irregular until OCPs  history of abnormal pap in past, repeat was normal Last pap roughly 3 years ago G0P0, s/p No history of abnormal MGM, no h/o MGM  No concerns today no gyn surgeries LMP May 2013  . CTS (carpal tunnel syndrome) 01/15/2014  . Diabetes mellitus type 2 in obese ([place]) 10/30/2011  . Dry eyes 08/17/2016  . Early menopause   . Elevated BP 04/21/2011  . Essential hypertension   . HIATAL HERNIA WITH REFLUX, HX OF 10/16/2010  . Hyperlipidemia 04/21/2011  . Inappropriate sinus tachycardia   . INSOMNIA 10/16/2010  . Knee pain, left 03/11/2012  . Migraine 03/07/2013  . Obesity   . Obesity (BMI 30-39.9) 01/12/2018  . Palpitations 01/28/2015  . Reflux 04/22/2012  . Sleep apnea 06/07/2011  . Tobacco use disorder 02/10/2017  . Urinary urgency 08/17/2016  . UTI'S, HX OF 10/16/2010    Past Surgical History:  Procedure Laterality Date  . CHOLECYSTECTOMY      Current Medications: Current Meds  Medication Sig  . ACETAMINOPHEN-BUTALBITAL 50-325 MG TABS Take 1 tablet by mouth 2 (two) times daily as needed (HEADACHES).  Marland Kitchen amitriptyline (ELAVIL) 10 MG tablet TAKE 1 TABLET(10 MG) BY MOUTH AT BEDTIME  . BAYER MICROLET LANCETS lancets Use as  directed once daily to check blood sugar.   DX E11.9  . Blood Glucose Monitoring Suppl (CONTOUR BLOOD GLUCOSE SYSTEM) DEVI Use daily to check blood sugar. DX E11.9  . busPIRone (BUSPAR) 10 MG tablet Take 0.5-1 tablets (5-10 mg total) by mouth 3 (three) times daily.  Marland Kitchen conjugated estrogens (PREMARIN) vaginal cream Place 1 Applicatorful vaginally once a week.  . CONTOUR NEXT TEST test strip USE TO CHECK BLOOD SUGAR DAILY  . cyclobenzaprine (FLEXERIL) 10 MG tablet TAKE 1 TABLET(10 MG) BY MOUTH TWICE DAILY AS NEEDED FOR MUSCLE SPASMS  .  fluconazole (DIFLUCAN) 150 MG tablet Take 1 tablet (150 mg total) by mouth once a week.  Marland Kitchen FLUoxetine (PROZAC) 20 MG tablet Take 3 tablets (60 mg total) by mouth daily.  . fluticasone (FLONASE) 50 MCG/ACT nasal spray Place 2 sprays into both nostrils daily.  Marland Kitchen HYDROcodone-acetaminophen (NORCO/VICODIN) 5-325 MG tablet Take 1 tablet by mouth every 6 (six) hours as needed. Patient needs appointment for further refills.  . meloxicam (MOBIC) 15 MG tablet Take 1 tablet (15 mg total) by mouth daily as needed for pain.  . metFORMIN (GLUCOPHAGE) 500 MG tablet TAKE 2 TABLETS BY MOUTH TWICE DAILY  . montelukast (SINGULAIR) 10 MG tablet TAKE 1 TABLET(10 MG) BY MOUTH AT BEDTIME  . omeprazole (PRILOSEC) 20 MG capsule Take 1 capsule (20 mg total) by mouth 2 (two) times daily before a meal.  . promethazine (PHENERGAN) 25 MG tablet Take 1 tablet (25 mg total) by mouth every 8 (eight) hours as needed for nausea or vomiting.  . propranolol ER (INDERAL LA) 60 MG 24 hr capsule Take 2 capsules (120 mg total) by mouth daily.  . ranitidine (ZANTAC) 300 MG capsule TAKE 1 CAPSULE(300 MG) BY MOUTH EVERY EVENING  . Saline 0.65 % (SOLN) SOLN Place 2 Squirts into the nose 2 (two) times daily. (Patient taking differently: Place 2 Squirts into the nose 3 (three) times a week. )  . SUMAtriptan (IMITREX) 50 MG tablet Take 1 tablet (50 mg total) by mouth every 2 (two) hours as needed for migraine. May repeat in 2 hours if headache persists or recurs.  . [DISCONTINUED] losartan (COZAAR) 25 MG tablet Take 1 tablet (25 mg total) by mouth daily.     Allergies:   Patient has no known allergies.   Social History   Socioeconomic History  . Marital status: Married    Spouse name: Not on file  . Number of children: Not on file  . Years of education: Not on file  . Highest education level: Not on file  Occupational History  . Not on file  Social Needs  . Financial resource strain: Not on file  . Food insecurity:    Worry: Not on  file    Inability: Not on file  . Transportation needs:    Medical: Not on file    Non-medical: Not on file  Tobacco Use  . Smoking status: Current Some Day Smoker    Packs/day: 0.10    Types: Cigars  . Smokeless tobacco: Never Used  . Tobacco comment: 1 black and milds  Substance and Sexual Activity  . Alcohol use: Yes    Comment: twice/year  . Drug use: No  . Sexual activity: Not on file  Lifestyle  . Physical activity:    Days per week: Not on file    Minutes per session: Not on file  . Stress: Not on file  Relationships  . Social connections:    Talks on phone:  Not on file    Gets together: Not on file    Attends religious service: Not on file    Active member of club or organization: Not on file    Attends meetings of clubs or organizations: Not on file    Relationship status: Not on file  Other Topics Concern  . Not on file  Social History Narrative   Married lives w/ husband, works at [employer] History: The patient's family history includes Allergies in her brother; Arthritis in her maternal grandmother and another family member; Asthma in her sister; Breast cancer in her paternal grandmother; Cancer in her father, maternal grandfather, and paternal grandmother; Cataracts in her maternal grandmother; Coronary artery disease in her maternal grandfather; Diabetes in her maternal grandfather and paternal grandfather; Glaucoma in her maternal grandmother; Lupus in her mother; Scleroderma in her maternal grandmother; Thyroid disease in her paternal grandmother and sister.  ROS:   Please see the history of present illness.    All other systems reviewed and are negative.  EKGs/Labs/Other Studies Reviewed:    The following studies were reviewed today:  EKG:  EKG is not ordered today.    Recent Labs: 01/14/2018: ALT 16; BUN 11; Creatinine, Ser 0.68; Hemoglobin 13.2; Platelets 200.0; Potassium 3.9; Sodium 137; TSH 1.29  Recent Lipid Panel    Component  Value Date/Time   CHOL 159 01/14/2018 1221   TRIG 155.0 (H) 01/14/2018 1221   HDL 67.60 01/14/2018 1221   CHOLHDL 2 01/14/2018 1221   VLDL 31.0 01/14/2018 1221   LDLCALC 61 01/14/2018 1221    Physical Exam:    VS:  BP (!) 136/94   Pulse (!) 110   Ht 5' 3.5" (1.613 m)   Wt 190 lb 6.4 oz (86.4 kg)   LMP 01/08/2013   SpO2 97%   BMI 33.20 kg/m     Wt Readings from Last 3 Encounters:  08/20/18 190 lb 6.4 oz (86.4 kg)  08/03/18 187 lb (84.8 kg)  05/04/18 194 lb 6.4 oz (88.2 kg)     GEN: Well nourished, well developed in no acute distress HEENT: Normal NECK: No JVD; No carotid bruits LYMPHATICS: No lymphadenopathy CARDIAC: RRR, no murmurs, rubs, gallops RESPIRATORY:  Clear to auscultation without rales, wheezing or rhonchi  ABDOMEN: Soft, non-tender, non-distended MUSCULOSKELETAL:  No edema; No deformity  SKIN: Warm and dry NEUROLOGIC:  Alert and oriented x 3 PSYCHIATRIC:  Normal affect   ASSESSMENT:    1. Inappropriate sinus tachycardia   2. Essential hypertension   3. Mixed hyperlipidemia    PLAN:    In order of problems listed above:  1. Her palpitations are now well controlled, will continue same dose of propranolol.  I am not sure if this medication would cause her hair problems, patient not interested in changing medication right now.  I would specifically continue propranolol as it is also helping with her migraines. 2. I would increase losartan to 50 mg daily. 3. Start rosuvastatin 5 mg 3 times a week check CMP, lipids prior to next visit in 3 months.  Medication Adjustments/Labs and Tests Ordered: Current medicines are reviewed at length with the patient today.  Concerns regarding medicines are outlined above.  Orders Placed This Encounter  Procedures  . Comp Met (CMET)  . CBC w/Diff  . TSH  . Lipid Profile   Meds ordered this encounter  Medications  . losartan (COZAAR) 50 MG tablet    Sig: Take 1 tablet (50 mg total)  by mouth daily.    Dispense:  90  tablet    Refill:  2  . rosuvastatin (CRESTOR) 5 MG tablet    Sig: Take 1 tablet (5 mg total) by mouth 3 (three) times a week.    Dispense:  45 tablet    Refill:  1    Patient Instructions  Medication Instructions:   INCREASE YOUR LOSARTAN TO 50 MG ONCE DAILY  START TAKING ROSUVASTATIN 5 MG BY MOUTH THREE TIMES WEEKLY   If you need a refill on your cardiac medications before your next appointment, please call your pharmacy.       Lab work:  IN 3 MONTHS--PRIOR TO YOUR 3 MONTH FOLLOW-UP APPOINTMENT WITH DR Saraia Platner--WE WILL CHECK A CMET, CBC W DIFF, TSH, AND LIPIDS--PLEASE COME FASTING TO THIS LAB APPOINTMENT  If you have labs (blood work) drawn today and your tests are completely normal, you will receive your results only by: Marland Kitchen MyChart Message (if you have MyChart) OR . A paper copy in the mail If you have any lab test that is abnormal or we need to change your treatment, we will call you to review the results.      Follow-Up:  3 MONTHS WITH DR Kataleya Zaugg--PLEASE HAVE YOUR LABS DONE A WEEK PRIOR TO THIS APPOINTMENT      Signed, Ena Dawley, MD  08/20/2018 8:39 AM    [place]

## 2018-08-20 NOTE — Patient Instructions (Signed)
Medication Instructions:   INCREASE YOUR LOSARTAN TO 50 MG ONCE DAILY  START TAKING ROSUVASTATIN 5 MG BY MOUTH THREE TIMES WEEKLY   If you need a refill on your cardiac medications before your next appointment, please call your pharmacy.       Lab work:  IN 3 MONTHS--PRIOR TO YOUR 3 MONTH FOLLOW-UP APPOINTMENT WITH DR NELSON--WE WILL CHECK A CMET, CBC W DIFF, TSH, AND LIPIDS--PLEASE COME FASTING TO THIS LAB APPOINTMENT  If you have labs (blood work) drawn today and your tests are completely normal, you will receive your results only by: Marland Kitchen MyChart Message (if you have MyChart) OR . A paper copy in the mail If you have any lab test that is abnormal or we need to change your treatment, we will call you to review the results.      Follow-Up:  3 MONTHS WITH DR NELSON--PLEASE HAVE YOUR LABS DONE A WEEK PRIOR TO THIS APPOINTMENT

## 2018-08-27 ENCOUNTER — Other Ambulatory Visit: Payer: Self-pay | Admitting: Family Medicine

## 2018-08-27 DIAGNOSIS — M25562 Pain in left knee: Secondary | ICD-10-CM

## 2018-08-27 NOTE — Telephone Encounter (Signed)
Copied from CRM [id number]. Topic: Quick Communication - Rx Refill/Question >> Aug 27, 2018 12:25 PM Maia Pettiesrtiz, Kristie S wrote: Medication: HYDROcodone-acetaminophen (NORCO/VICODIN) 5-325 MG tablet - pt is 1 day early for refill stating her legs/knees are hurting - Pt hoping she can get before the weekend - she had shot OV 08/03/2018 with [place], PA  Has the patient contacted their pharmacy? No - controlled Preferred Pharmacy (with phone number or street name): [id number] - [place],  - [address] AT [facility] [address] [phone] (Phone) [phone] (Fax)

## 2018-08-30 ENCOUNTER — Other Ambulatory Visit: Payer: Self-pay | Admitting: Family Medicine

## 2018-08-30 MED ORDER — HYDROCODONE-ACETAMINOPHEN 5-325 MG PO TABS: 1.0000 | ORAL_TABLET | Freq: Four times a day (QID) | ORAL | 0 refills | Status: DC | PRN

## 2018-08-30 NOTE — Telephone Encounter (Signed)
Requesting: Norco Contract: [id number] risk next screen 11/02/18 Last OV:05/04/18 Next OV:not scheduled  Last Refill:07/26/18   #[id number] Database:  Patient was advised on her last refill she needed to schedule an appointment with you. She seen edward on 08/02/18. She states now she is in pain and will schedule w/ you soon   Please advise

## 2018-08-31 NOTE — Telephone Encounter (Signed)
Patient has medication

## 2018-09-20 ENCOUNTER — Other Ambulatory Visit: Payer: Self-pay | Admitting: Family Medicine

## 2018-09-22 ENCOUNTER — Telehealth: Payer: Self-pay

## 2018-09-22 NOTE — Telephone Encounter (Signed)
PA cancelled.   This medication or product is on your plan's list of covered drugs. Prior authorization is not required at this time. If your pharmacy has questions regarding the processing of your prescription, please have them call the [facility] pharmacy help desk at [phone]. **Please note: Formulary lowering, tiering exception, cost reduction and prospective Medicare hospice reviews cannot be requested using this method of submission. Please contact us at [phone] instead. 

## 2018-09-22 NOTE — Telephone Encounter (Signed)
Opened in error

## 2018-09-22 NOTE — Telephone Encounter (Signed)
PA initiated via Covermymeds; KEY: [id number]. Awaiting determination.

## 2018-09-24 ENCOUNTER — Telehealth: Payer: Self-pay | Admitting: Family Medicine

## 2018-09-24 ENCOUNTER — Encounter: Payer: Self-pay | Admitting: Family Medicine

## 2018-09-24 NOTE — Telephone Encounter (Signed)
Noted  

## 2018-09-24 NOTE — Telephone Encounter (Signed)
Patient called and stated that she needed her forms filled out right away.  It was due today and she would loose her job if it was not sent in today.   Form filled out and signed by Efraim Kaufmann and faxed to [employer].  Form placed up front for patient to pickup.  Also advised patient to make sure she does not bring paperwork in at last minute because it usually takes about 7-10 business days and we cannot push her paperwork in front of others.

## 2018-09-24 NOTE — Telephone Encounter (Signed)
Patient came by the office needing a health screening form filled out. Placed in provider tray. Please call when form is ready for pick up.

## 2018-10-07 ENCOUNTER — Other Ambulatory Visit: Payer: Self-pay | Admitting: Family Medicine

## 2018-10-07 DIAGNOSIS — M25562 Pain in left knee: Secondary | ICD-10-CM

## 2018-10-07 MED ORDER — HYDROCODONE-ACETAMINOPHEN 5-325 MG PO TABS: 1.0000 | ORAL_TABLET | Freq: Four times a day (QID) | ORAL | 0 refills | Status: DC | PRN

## 2018-10-07 MED ORDER — PROMETHAZINE HCL 25 MG PO TABS: 25.0000 mg | ORAL_TABLET | Freq: Three times a day (TID) | ORAL | 0 refills | Status: DC | PRN

## 2018-10-07 MED ORDER — FAMOTIDINE 20 MG PO TABS: 20.0000 mg | ORAL_TABLET | Freq: Every day | ORAL | 1 refills | Status: DC

## 2018-10-07 NOTE — Telephone Encounter (Signed)
Tell her we have been switching people from Ranitidine 300 to Famotidine 40 mg if she is willing send in the prescription for 6 months. I sent in the other meds

## 2018-10-08 NOTE — Telephone Encounter (Signed)
Opened in error

## 2018-10-11 ENCOUNTER — Telehealth: Payer: Self-pay

## 2018-10-11 NOTE — Telephone Encounter (Signed)
PA approved.   Request Reference Number: [id number]. HYDROCO/APAP TAB 5-325MG  is approved through 11/11/2018. For further questions, call [phone]

## 2018-10-11 NOTE — Telephone Encounter (Signed)
PA initiated via Covermymeds; KEY: [id number]. Awaiting determination.

## 2018-10-14 ENCOUNTER — Ambulatory Visit (INDEPENDENT_AMBULATORY_CARE_PROVIDER_SITE_OTHER): Payer: 59 | Admitting: Medical

## 2018-10-14 ENCOUNTER — Encounter: Payer: Self-pay | Admitting: Medical

## 2018-10-14 VITALS — BP 142/80 | HR 79 | Temp 98.0°F | Resp 16 | Ht 63.0 in | Wt 194.2 lb

## 2018-10-14 DIAGNOSIS — E669 Obesity, unspecified: Secondary | ICD-10-CM | POA: Diagnosis not present

## 2018-10-14 DIAGNOSIS — G8929 Other chronic pain: Secondary | ICD-10-CM

## 2018-10-14 DIAGNOSIS — E1169 Type 2 diabetes mellitus with other specified complication: Secondary | ICD-10-CM | POA: Diagnosis not present

## 2018-10-14 DIAGNOSIS — J301 Allergic rhinitis due to pollen: Secondary | ICD-10-CM | POA: Diagnosis not present

## 2018-10-14 DIAGNOSIS — M5441 Lumbago with sciatica, right side: Secondary | ICD-10-CM

## 2018-10-14 DIAGNOSIS — I10 Essential (primary) hypertension: Secondary | ICD-10-CM

## 2018-10-14 DIAGNOSIS — E119 Type 2 diabetes mellitus without complications: Secondary | ICD-10-CM

## 2018-10-14 LAB — HEMOGLOBIN A1C: Hgb A1c MFr Bld: 6.9 % — ABNORMAL HIGH (ref 4.6–6.5)

## 2018-10-14 MED ORDER — FLUTICASONE PROPIONATE 50 MCG/ACT NA SUSP: 2.0000 | Freq: Every day | NASAL | 6 refills | Status: DC

## 2018-10-14 MED ORDER — KETOROLAC TROMETHAMINE 30 MG/ML IJ SOLN
30.0000 mg | Freq: Once | INTRAMUSCULAR | Status: AC
  Administered 2018-10-14: 30 mg via INTRAVENOUS

## 2018-10-14 MED ORDER — AZELASTINE HCL 0.1 % NA SOLN: 2.0000 | Freq: Two times a day (BID) | NASAL | 12 refills | Status: DC

## 2018-10-14 MED ORDER — GABAPENTIN 100 MG PO CAPS: 100.0000 mg | ORAL_CAPSULE | Freq: Every day | ORAL | 0 refills | Status: DC

## 2018-10-14 NOTE — Patient Instructions (Addendum)
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  Pain level is moderate to severe at times.  This is despite using hydrocodone.  You also have Flexeril available as well.  Today we gave you low-dose Toradol 30 mg IM.  Want to give you relief but going forward still be cautious with NSAIDs as they can increase your blood pressure.  Your blood pressure when I checked it today was lower than initial reading.  Would recommend that you use Salon pas lidocaine patch to right SI area.  Might consider adding short low-dose taper prednisone over the next 5 to 6 days but want to get your three-month blood sugar average first as prednisone can make your sugar increase.  Referral to sports medicine already placed.  Hopefully they will see you sometime next week.  You might need MRI.  For history of diabetes, continue with low sugar diet and will follow the A1c result.  You might need adjustment with your treatment regimen.  For high blood pressure, continue current medication regimen.  For allergic rhinitis, recommend that you get plain Allegra over-the-counter and use in place of Zyrtec.  Continue with Flonase and montelukast.  Making Astelin nasal spray as add-on if spring season difficult despite other allergy meds.  Follow-up with [place] in 1 week if you have not had sports medicine appointment by then.  Also follow-up with [place] as needed.

## 2018-10-14 NOTE — Progress Notes (Signed)
Subjective:    Patient ID: Nicole Ball, female    DOB: [birth year], 43 y.o.   MRN: [id number]  HPI  Pt in for back pain with occasional rt hip area pain that seemed to radiate from back.. Pt seen in December. She states that never got better completely. She states still having pain sharp and shoot with some pain that runs to her leg. Pt states tingling type pain is not as severe as before.  In December xray impression. IMPRESSION: Moderate disc space narrowing at L4-5 and L5-S1. Other disc spaces appear unremarkable. No fracture or spondylolisthesis.  Pt does take occasional low dose ibuprofen. Pt is cautious not to overuse do to bp.   Pt states pain on average pain 7/10. Other day brief pain increase close to 10 and brought tears to eyes.   Pt states nasal and chest congestion for about 2 weeks. She has been sneezing. Pt note does get spring allergies.  LMP- 5 or more years early menopause.   Review of Systems  Constitutional: Negative for chills, fatigue and fever.  HENT: Positive for congestion and postnasal drip. Negative for facial swelling and sinus pain.   Respiratory: Negative for cough, chest tightness, shortness of breath and wheezing.   Cardiovascular: Negative for chest pain and palpitations.  Gastrointestinal: Negative for abdominal pain and blood in stool.  Musculoskeletal: Positive for back pain. Negative for arthralgias, gait problem, neck pain and neck stiffness.  Skin: Negative for rash.  Neurological: Negative for dizziness, speech difficulty, weakness, light-headedness and headaches.  Hematological: Negative for adenopathy.  Psychiatric/Behavioral: Negative for behavioral problems and decreased concentration. The patient is not nervous/anxious.     Past Medical History:  Diagnosis Date  . Abnormal serum level of alkaline phosphatase 05/21/2014  . Acute low back pain 12/19/2010  . ALLERGIC RHINITIS 10/16/2010  . Allergy    seasonal  . Anxiety and  depression 06/07/2011  . Atrophic vaginitis 08/17/2016  . Cervical cancer screening 01/10/2014   Menarche at 8 Regular and moderate flow, became irregular until OCPs  history of abnormal pap in past, repeat was normal Last pap roughly 3 years ago G0P0, s/p No history of abnormal MGM, no h/o MGM  No concerns today no gyn surgeries LMP May 2013  . CTS (carpal tunnel syndrome) 01/15/2014  . Diabetes mellitus type 2 in obese (HCC) 10/30/2011  . Dry eyes 08/17/2016  . Early menopause   . Elevated BP 04/21/2011  . Essential hypertension   . HIATAL HERNIA WITH REFLUX, HX OF 10/16/2010  . Hyperlipidemia 04/21/2011  . Inappropriate sinus tachycardia   . INSOMNIA 10/16/2010  . Knee pain, left 03/11/2012  . Migraine 03/07/2013  . Obesity   . Obesity (BMI 30-39.9) 01/12/2018  . Palpitations 01/28/2015  . Reflux 04/22/2012  . Sleep apnea 06/07/2011  . Tobacco use disorder 02/10/2017  . Urinary urgency 08/17/2016  . UTI'S, HX OF 10/16/2010     Social History   Socioeconomic History  . Marital status: Married    Spouse name: Not on file  . Number of children: Not on file  . Years of education: Not on file  . Highest education level: Not on file  Occupational History  . Not on file  Social Needs  . Financial resource strain: Not on file  . Food insecurity:    Worry: Not on file    Inability: Not on file  . Transportation needs:    Medical: Not on file    Non-medical: Not on file  Tobacco Use  . Smoking status: Current Some Day Smoker    Packs/day: 0.10    Types: Cigars  . Smokeless tobacco: Never Used  . Tobacco comment: 1 black and milds  Substance and Sexual Activity  . Alcohol use: Yes    Comment: twice/year  . Drug use: No  . Sexual activity: Not on file  Lifestyle  . Physical activity:    Days per week: Not on file    Minutes per session: Not on file  . Stress: Not on file  Relationships  . Social connections:    Talks on phone: Not on file    Gets together: Not on file    Attends religious  service: Not on file    Active member of club or organization: Not on file    Attends meetings of clubs or organizations: Not on file    Relationship status: Not on file  . Intimate partner violence:    Fear of current or ex partner: Not on file    Emotionally abused: Not on file    Physically abused: Not on file    Forced sexual activity: Not on file  Other Topics Concern  . Not on file  Social History Narrative   Married lives w/ husband, works at [employer]    Past Surgical History:  Procedure Laterality Date  . CHOLECYSTECTOMY      Family History  Problem Relation Age of Onset  . Lupus Mother   . Arthritis Maternal Grandmother   . Scleroderma Maternal Grandmother   . Glaucoma Maternal Grandmother   . Cataracts Maternal Grandmother   . Cancer Maternal Grandfather        prostate  . Coronary artery disease Maternal Grandfather        s/p angioplasty  . Diabetes Maternal Grandfather        Type 2  . Cancer Paternal Grandmother        Breast ca- dx in 28's  . Thyroid disease Paternal Grandmother   . Breast cancer Paternal Grandmother   . Diabetes Paternal Grandfather   . Thyroid disease Sister   . Asthma Sister   . Allergies Brother   . Cancer Father        cigarette, alcohol HEENT squamous cell  . Arthritis Other     No Known Allergies  Current Outpatient Medications on File Prior to Visit  Medication Sig Dispense Refill  . ACETAMINOPHEN-BUTALBITAL 50-325 MG TABS Take 1 tablet by mouth 2 (two) times daily as needed (HEADACHES). 120 each 0  . BAYER MICROLET LANCETS lancets Use as directed once daily to check blood sugar.   DX E11.9 100 each 1  . Blood Glucose Monitoring Suppl (CONTOUR BLOOD GLUCOSE SYSTEM) DEVI Use daily to check blood sugar. DX E11.9 1 Device 0  . busPIRone (BUSPAR) 10 MG tablet TAKE 1/2 TO 1 TABLET(5 TO 10 MG) BY MOUTH THREE TIMES DAILY 90 tablet 2  . conjugated estrogens (PREMARIN) vaginal cream Place 1 Applicatorful vaginally once a week.  42.5 g 121  . CONTOUR NEXT TEST test strip USE TO CHECK BLOOD SUGAR DAILY 100 each 2  . cyclobenzaprine (FLEXERIL) 10 MG tablet TAKE 1 TABLET(10 MG) BY MOUTH TWICE DAILY AS NEEDED FOR MUSCLE SPASMS 60 tablet 0  . famotidine (PEPCID) 20 MG tablet Take 1 tablet (20 mg total) by mouth daily. 90 tablet 1  . fluconazole (DIFLUCAN) 150 MG tablet Take 1 tablet (150 mg total) by mouth once a week. 3 tablet 0  .  FLUoxetine (PROZAC) 20 MG tablet TAKE 3 TABLETS(60 MG) BY MOUTH DAILY 90 tablet 3  . HYDROcodone-acetaminophen (NORCO/VICODIN) 5-325 MG tablet Take 1 tablet by mouth every 6 (six) hours as needed. Patient needs appointment for further refills. 100 tablet 0  . losartan (COZAAR) 50 MG tablet Take 1 tablet (50 mg total) by mouth daily. 90 tablet 2  . meloxicam (MOBIC) 15 MG tablet Take 1 tablet (15 mg total) by mouth daily as needed for pain. 90 tablet 1  . metFORMIN (GLUCOPHAGE) 500 MG tablet TAKE 2 TABLETS BY MOUTH TWICE DAILY 360 tablet 1  . montelukast (SINGULAIR) 10 MG tablet TAKE 1 TABLET(10 MG) BY MOUTH AT BEDTIME 90 tablet 1  . omeprazole (PRILOSEC) 20 MG capsule Take 1 capsule (20 mg total) by mouth 2 (two) times daily before a meal. 180 capsule 3  . promethazine (PHENERGAN) 25 MG tablet Take 1 tablet (25 mg total) by mouth every 8 (eight) hours as needed for nausea or vomiting. 20 tablet 0  . propranolol ER (INDERAL LA) 60 MG 24 hr capsule Take 2 capsules (120 mg total) by mouth daily. 180 capsule 3  . rosuvastatin (CRESTOR) 5 MG tablet Take 1 tablet (5 mg total) by mouth 3 (three) times a week. 45 tablet 1  . Saline 0.65 % (SOLN) SOLN Place 2 Squirts into the nose 2 (two) times daily. (Patient taking differently: Place 2 Squirts into the nose 3 (three) times a week. ) 1 Bottle 0  . SUMAtriptan (IMITREX) 50 MG tablet Take 1 tablet (50 mg total) by mouth every 2 (two) hours as needed for migraine. May repeat in 2 hours if headache persists or recurs. 10 tablet 3  . amitriptyline (ELAVIL) 10 MG  tablet TAKE 1 TABLET(10 MG) BY MOUTH AT BEDTIME (Patient not taking: Reported on 10/14/2018) 90 tablet 2   No current facility-administered medications on file prior to visit.     BP (!) 134/98   Pulse 79   Temp 98 F (36.7 C) (Oral)   Resp 16   Ht  (1.6 m)   Wt 194 lb 3.2 oz (88.1 kg)   LMP 01/08/2013   SpO2 100%   BMI 34.40 kg/m       Objective:   Physical Exam  General Appearance- Not in acute distress.    Chest and Lung Exam Auscultation: Breath sounds:-Normal. Clear even and unlabored. Adventitious sounds:- No Adventitious sounds.  Cardiovascular Auscultation:Rythm - Regular, rate and rythm. Heart Sounds -Normal heart sounds.  Abdomen Inspection:-Inspection Normal.  Palpation/Perucssion: Palpation and Percussion of the abdomen reveal- Non Tender, No Rebound tenderness, No rigidity(Guarding) and No Palpable abdominal masses.  Liver:-Normal.  Spleen:- Normal.   Back Mid lumbar spine tenderness to palpation. R si tenderness to palpation. Pain on straight leg lift. Pain on lateral movements and flexion/extension of the spine.  Lower ext neurologic  L5-S1 sensation intact bilaterally. Normal patellar reflexes bilaterally. No foot drop bilaterally.   HEENT Head- Normal. Ear Auditory Canal - Left- Normal. Right - Normal.Tympanic Membrane- Left- Normal. Right- Normal. Eye Sclera/Conjunctiva- Left- Normal. Right- Normal. Nose & Sinuses Nasal Mucosa- Left-  Boggy and Congested. Right-  Boggy and  Congested.Bilateral no  maxillary and  No frontal sinus pressure. Mouth & Throat Lips: Upper Lip- Normal: no dryness, cracking, pallor, cyanosis, or vesicular eruption. Lower Lip-Normal: no dryness, cracking, pallor, cyanosis or vesicular eruption. Buccal Mucosa- Bilateral- No Aphthous ulcers. Oropharynx- No Discharge or Erythema. +pnd. Tonsils: Characteristics- Bilateral- No Erythema or Congestion. Size/Enlargement- Bilateral- No enlargement.  Discharge-  bilateral-None.  .      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  Pain level is moderate to severe at times.  This is despite using hydrocodone.  You also have Flexeril available as well.  Today we gave you low-dose Toradol 30 mg IM.  Want to give you relief but going forward still be cautious with NSAIDs as they can increase your blood pressure.  Your blood pressure when I checked it today was lower than initial reading.  Would recommend that you use Salon pas lidocaine patch to right SI area.  Might consider adding short low-dose taper prednisone over the next 5 to 6 days but want to get your three-month blood sugar average first as prednisone can make your sugar increase.  Referral to sports medicine already placed.  Hopefully they will see you sometime next week.  You might need MRI.  For history of diabetes, continue with low sugar diet and will follow the A1c result.  You might need adjustment with your treatment regimen.  For high blood pressure, continue current medication regimen.  For allergic rhinitis, recommend that you get plain Allegra over-the-counter and use in place of Zyrtec.  Continue with Flonase and montelukast.  Making Astelin nasal spray as add-on if spring season difficult despite other allergy meds.  Follow-up with [place] in 1 week if you have not had sports medicine appointment by then.  Also follow-up with [place] as needed.  Esperanza Richters, PA-C

## 2018-10-18 ENCOUNTER — Telehealth: Payer: Self-pay

## 2018-10-18 NOTE — Telephone Encounter (Signed)
Insurance doesn't cover fluoxetine tablets. Okay to switch to capsules?

## 2018-10-18 NOTE — Telephone Encounter (Signed)
Fine to switch to capsules at same strength with same sig. It is the same medication. Please send in new rx

## 2018-10-19 MED ORDER — FLUOXETINE HCL 20 MG PO CAPS: 20.0000 mg | ORAL_CAPSULE | Freq: Every day | ORAL | 3 refills | Status: DC

## 2018-10-19 NOTE — Telephone Encounter (Signed)
Sent medication in

## 2018-11-10 ENCOUNTER — Other Ambulatory Visit: Payer: Self-pay | Admitting: Medical

## 2018-11-13 ENCOUNTER — Other Ambulatory Visit: Payer: Self-pay | Admitting: Family Medicine

## 2018-11-15 ENCOUNTER — Other Ambulatory Visit: Payer: Self-pay | Admitting: Family Medicine

## 2018-11-15 DIAGNOSIS — M25562 Pain in left knee: Secondary | ICD-10-CM

## 2018-11-15 MED ORDER — HYDROCODONE-ACETAMINOPHEN 5-325 MG PO TABS: 1.0000 | ORAL_TABLET | Freq: Four times a day (QID) | ORAL | 0 refills | Status: DC | PRN

## 2018-11-16 NOTE — Telephone Encounter (Signed)
Patient calling and states that the pharmacy is telling her that they never received the refill prescription for this medication. Please advise. Would like to know if this could be resent to the pharmacy?

## 2018-11-17 ENCOUNTER — Other Ambulatory Visit: Payer: Self-pay | Admitting: Family Medicine

## 2018-11-17 ENCOUNTER — Ambulatory Visit: Payer: 59 | Admitting: Family Medicine

## 2018-11-17 DIAGNOSIS — M25562 Pain in left knee: Secondary | ICD-10-CM

## 2018-11-17 MED ORDER — HYDROCODONE-ACETAMINOPHEN 5-325 MG PO TABS: 1.0000 | ORAL_TABLET | Freq: Four times a day (QID) | ORAL | 0 refills | Status: DC | PRN

## 2018-11-17 NOTE — Telephone Encounter (Signed)
I resent the med but she has not been seen by me since Sept she needs a virtual visit to continue her meds moving forward.

## 2018-11-17 NOTE — Telephone Encounter (Signed)
Medication needs to be resent. Meds was set as print

## 2018-11-17 NOTE — Telephone Encounter (Signed)
Kirsten can you schedule patient with a doxy virtual visit  Thanks

## 2018-11-17 NOTE — Telephone Encounter (Signed)
Virtual appt schedule for 11/17/18

## 2018-11-17 NOTE — Addendum Note (Signed)
Addended by: Crissie Sickles A on: 11/17/2018 09:45 AM   Modules accepted: Orders

## 2018-11-18 ENCOUNTER — Ambulatory Visit: Payer: 59 | Admitting: Family Medicine

## 2018-11-18 ENCOUNTER — Other Ambulatory Visit: Payer: Self-pay

## 2018-11-22 ENCOUNTER — Telehealth: Payer: Self-pay | Admitting: *Deleted

## 2018-11-22 ENCOUNTER — Ambulatory Visit (INDEPENDENT_AMBULATORY_CARE_PROVIDER_SITE_OTHER): Payer: 59 | Admitting: Family Medicine

## 2018-11-22 ENCOUNTER — Other Ambulatory Visit: Payer: Self-pay

## 2018-11-22 ENCOUNTER — Telehealth: Payer: Self-pay

## 2018-11-22 ENCOUNTER — Encounter: Payer: Self-pay | Admitting: *Deleted

## 2018-11-22 DIAGNOSIS — F32A Depression, unspecified: Secondary | ICD-10-CM

## 2018-11-22 DIAGNOSIS — I10 Essential (primary) hypertension: Secondary | ICD-10-CM | POA: Diagnosis not present

## 2018-11-22 DIAGNOSIS — E1169 Type 2 diabetes mellitus with other specified complication: Secondary | ICD-10-CM | POA: Diagnosis not present

## 2018-11-22 DIAGNOSIS — F419 Anxiety disorder, unspecified: Secondary | ICD-10-CM | POA: Diagnosis not present

## 2018-11-22 DIAGNOSIS — M549 Dorsalgia, unspecified: Secondary | ICD-10-CM | POA: Diagnosis not present

## 2018-11-22 DIAGNOSIS — F329 Major depressive disorder, single episode, unspecified: Secondary | ICD-10-CM

## 2018-11-22 DIAGNOSIS — E669 Obesity, unspecified: Secondary | ICD-10-CM

## 2018-11-22 MED ORDER — GABAPENTIN 300 MG PO CAPS: ORAL_CAPSULE | ORAL | 3 refills | Status: DC

## 2018-11-22 NOTE — Telephone Encounter (Signed)
Virtual Visit Pre-Appointment Phone Call  Steps For Call:  1. Confirm consent - "In the setting of the current Covid19 crisis, you are scheduled for a ( video) visit with Dr. Delton SeeNelson on 12/09/18 at 8:20 am.  Just as we do with many in-office visits, in order for you to participate in this visit, we must obtain consent.  If you'd like, I can send this to your mychart (if signed up) or email for you to review.  Otherwise, I can obtain your verbal consent now.  All virtual visits are billed to your insurance company just like a normal visit would be.  By agreeing to a virtual visit, we'd like you to understand that the technology does not allow for your provider to perform an examination, and thus may limit your provider's ability to fully assess your condition.  Finally, though the technology is pretty good, we cannot assure that it will always work on either your or our end, and in the setting of a video visit, we may have to convert it to a phone-only visit.  In either situation, we cannot ensure that we have a secure connection.  Are you willing to proceed?"    2. Advise patient to be prepared with any vital sign or heart rhythm information, their current medicines, and a piece of paper and pen handy for any instructions they may receive the day of their visit  3. Inform patient they will receive a phone call 15 minutes prior to their appointment time (may be from unknown caller ID) so they should be prepared to answer  4. Confirm that appointment type is correct in Epic appointment notes (video visit through doximity with Dr. Delton SeeNelson on 4/30 at 0820)    TELEPHONE CALL NOTE  Nicole Ball has been deemed a candidate for a follow-up tele-health visit to limit community exposure during the Covid-19 pandemic. I spoke with the patient via phone to ensure availability of phone/video source, confirm preferred email & phone number, and discuss instructions and expectations.  I reminded Nicole LinesAishia Z  Ball to be prepared with any vital sign and/or heart rhythm information that could potentially be obtained via home monitoring, at the time of her visit. I reminded Nicole Ball to expect a phone call at the time of her visit if her visit.  Did the patient verbally acknowledge consent to treatment? CONSENT TO TREAT WAS SENT TO THE PATIENTS ACTIVE MYCHART ACCOUNT-PT IS AWARE TO READ THIS PRIOR TO HER APPT WITH DR NELSON ON 4/30.  Loa SocksMartin,Oley Lahaie M, LPN 1/61/09604/13/2020 4:543:49 PM   CONSENT FOR TELE-HEALTH VISIT - PLEASE REVIEW  I hereby voluntarily request, consent and authorize [facility] and its employed or contracted physicians, physician assistants, nurse practitioners or other licensed health care professionals (the Practitioner), to provide me with telemedicine health care services (the Services") as deemed necessary by the treating Practitioner. I acknowledge and consent to receive the Services by the Practitioner via telemedicine. I understand that the telemedicine visit will involve communicating with the Practitioner through live audiovisual communication technology and the disclosure of certain medical information by electronic transmission. I acknowledge that I have been given the opportunity to request an in-person assessment or other available alternative prior to the telemedicine visit and am voluntarily participating in the telemedicine visit.  I understand that I have the right to withhold or withdraw my consent to the use of telemedicine in the course of my care at any time, without affecting my right to future care  or treatment, and that the Practitioner or I may terminate the telemedicine visit at any time. I understand that I have the right to inspect all information obtained and/or recorded in the course of the telemedicine visit and may receive copies of available information for a reasonable fee.  I understand that some of the potential risks of receiving the Services via  telemedicine include:   Delay or interruption in medical evaluation due to technological equipment failure or disruption;  Information transmitted may not be sufficient (e.g. poor resolution of images) to allow for appropriate medical decision making by the Practitioner; and/or   In rare instances, security protocols could fail, causing a breach of personal health information.  Furthermore, I acknowledge that it is my responsibility to provide information about my medical history, conditions and care that is complete and accurate to the best of my ability. I acknowledge that Practitioner's advice, recommendations, and/or decision may be based on factors not within their control, such as incomplete or inaccurate data provided by me or distortions of diagnostic images or specimens that may result from electronic transmissions. I understand that the practice of medicine is not an exact science and that Practitioner makes no warranties or guarantees regarding treatment outcomes. I acknowledge that I will receive a copy of this consent concurrently upon execution via email to the email address I last provided but may also request a printed copy by calling the office of [facility].    I understand that my insurance will be billed for this visit.   I have read or had this consent read to me.  I understand the contents of this consent, which adequately explains the benefits and risks of the Services being provided via telemedicine.   I have been provided ample opportunity to ask questions regarding this consent and the Services and have had my questions answered to my satisfaction.  I give my informed consent for the services to be provided through the use of telemedicine in my medical care  By participating in this telemedicine visit I agree to the above.  YES PATIENT AGREED TO THE VIDEO VISIT AS SCHEDULED AND HER CONSENT WAS SENT TO HER ACTIVE MYCHART ACCOUNT.

## 2018-11-22 NOTE — Telephone Encounter (Addendum)
**Note De-identified  Obfuscation** -----  **Note De-Identified  Obfuscation** Message from Loa Socks, LPN sent at 1/85/6314  3:55 PM EDT ----- Regarding: HELP WITH COST OF PROPRANOLOL This pt has been on propranolol for sometime because she is Intolerant to all other beta blockers, and have tried and failed them all. Her insurance is now making her pay $43 for this old med for a months supply now. Is there any way you would be able to assist in getting the cost down?  I told  her I would ask?  Thanks for always helping me, Lajoyce Corners

## 2018-11-22 NOTE — Telephone Encounter (Signed)
I would switch to immediate release 40 mg PO BID

## 2018-11-22 NOTE — Telephone Encounter (Signed)
**Note De-Identified  Obfuscation** I attempted to do a tier exception for the pts Propranolol through covermymeds but was unable as a tier ex is not permitted per her plan.  I called [facility] and was going to do a tier exception over the phone with Jodie but she states that the pt must first try and fail the immediate release form of this medication.  Jodie states that the immediate release Propranolol is in a tablet form, is taken twice daily and is at a tier 1 which will cost the pt $15 for a 30 day supply or $37.50 for a 90 day supply.   Per Jodie, Propranolol immediate release comes in 10 mg, 20 mg, 40 mg, 60 mg, and 80 mg.  Will forward to Dr Delton See and her nurse for advisement to the pt.

## 2018-11-22 NOTE — Telephone Encounter (Signed)
-----   Message from Loa Socks, LPN sent at 02/09/7792  3:55 PM EDT ----- Regarding: HELP WITH COST OF PROPRANOLOL Hey Larita Fife hope you are doing well.  Sure do miss you.  This pt has been on propranolol for sometime because she is Intolerant to all other beta blockers, and have tried and failed them all. Her insurance is now making her pay $43 for this old med for a months supply now. Is there any way you would be able to assist in getting the cost down?  I told  her I would ask?  Thanks for always helping me, Lajoyce Corners

## 2018-11-23 DIAGNOSIS — M5416 Radiculopathy, lumbar region: Secondary | ICD-10-CM | POA: Insufficient documentation

## 2018-11-23 DIAGNOSIS — M549 Dorsalgia, unspecified: Secondary | ICD-10-CM | POA: Insufficient documentation

## 2018-11-23 NOTE — Progress Notes (Signed)
Virtual Visit via Video Note  I connected with Nicole Ball on 11/23/18 at 12:15 PM EDT by a video enabled telemedicine application and verified that I am speaking with the correct person using two identifiers.   I discussed the limitations of evaluation and management by telemedicine and the availability of in person appointments. The patient expressed understanding and agreed to proceed. Crissie Sickles, CMA was able to get patient set up on video platform.     Subjective:    Patient ID: Nicole Ball, female    DOB: 29-Sep-1975, 43 y.o.   MRN: [id number]  No chief complaint on file.   HPI Patient is in today for follow up on chronic right sided low back pain with radicular symptoms down right leg. Has trouble sleeping and getting comfortable still. Struggling with daily pain, mostly right sided lower back pain with right LE radicular pain. Gabapentin at 100 mg was not helpful so is up to 400 to 500 mg and that is helping more. No new falls. No incontinence. The gabapentin is helping sleep some. No recent febrile illness or hospitalizations. No polyuria or polydipsia complaints. Denies CP/palp/SOB/HA/congestion/fevers/GI or GU c/o. Taking meds as prescribed. Notes some ongoing anxiety due to the current Covid state but is managing well all things considered.   Past Medical History:  Diagnosis Date  . Abnormal serum level of alkaline phosphatase 05/21/2014  . Acute low back pain 12/19/2010  . ALLERGIC RHINITIS 10/16/2010  . Allergy    seasonal  . Anxiety and depression 06/07/2011  . Atrophic vaginitis 08/17/2016  . Cervical cancer screening 01/10/2014   Menarche at 8 Regular and moderate flow, became irregular until OCPs  history of abnormal pap in past, repeat was normal Last pap roughly 3 years ago G0P0, s/p No history of abnormal MGM, no h/o MGM  No concerns today no gyn surgeries LMP May 2013  . CTS (carpal tunnel syndrome) 01/15/2014  . Diabetes mellitus type 2 in obese (HCC)  10/30/2011  . Dry eyes 08/17/2016  . Early menopause   . Elevated BP 04/21/2011  . Essential hypertension   . HIATAL HERNIA WITH REFLUX, HX OF 10/16/2010  . Hyperlipidemia 04/21/2011  . Inappropriate sinus tachycardia   . INSOMNIA 10/16/2010  . Knee pain, left 03/11/2012  . Migraine 03/07/2013  . Obesity   . Obesity (BMI 30-39.9) 01/12/2018  . Palpitations 01/28/2015  . Reflux 04/22/2012  . Sleep apnea 06/07/2011  . Tobacco use disorder 02/10/2017  . Urinary urgency 08/17/2016  . UTI'S, HX OF 10/16/2010    Past Surgical History:  Procedure Laterality Date  . CHOLECYSTECTOMY      Family History  Problem Relation Age of Onset  . Lupus Mother   . Arthritis Maternal Grandmother   . Scleroderma Maternal Grandmother   . Glaucoma Maternal Grandmother   . Cataracts Maternal Grandmother   . Cancer Maternal Grandfather        prostate  . Coronary artery disease Maternal Grandfather        s/p angioplasty  . Diabetes Maternal Grandfather        Type 2  . Cancer Paternal Grandmother        Breast ca- dx in 19's  . Thyroid disease Paternal Grandmother   . Breast cancer Paternal Grandmother   . Diabetes Paternal Grandfather   . Thyroid disease Sister   . Asthma Sister   . Allergies Brother   . Cancer Father        cigarette, alcohol HEENT squamous  cell  . Arthritis Other     Social History   Socioeconomic History  . Marital status: Married    Spouse name: Not on file  . Number of children: Not on file  . Years of education: Not on file  . Highest education level: Not on file  Occupational History  . Not on file  Social Needs  . Financial resource strain: Not on file  . Food insecurity:    Worry: Not on file    Inability: Not on file  . Transportation needs:    Medical: Not on file    Non-medical: Not on file  Tobacco Use  . Smoking status: Current Some Day Smoker    Packs/day: 0.10    Types: Cigars  . Smokeless tobacco: Never Used  . Tobacco comment: 1 black and milds   Substance and Sexual Activity  . Alcohol use: Yes    Comment: twice/year  . Drug use: No  . Sexual activity: Not on file  Lifestyle  . Physical activity:    Days per week: Not on file    Minutes per session: Not on file  . Stress: Not on file  Relationships  . Social connections:    Talks on phone: Not on file    Gets together: Not on file    Attends religious service: Not on file    Active member of club or organization: Not on file    Attends meetings of clubs or organizations: Not on file    Relationship status: Not on file  . Intimate partner violence:    Fear of current or ex partner: Not on file    Emotionally abused: Not on file    Physically abused: Not on file    Forced sexual activity: Not on file  Other Topics Concern  . Not on file  Social History Narrative   Married lives w/ husband, works at [employer]    Outpatient Medications Prior to Visit  Medication Sig Dispense Refill  . ACETAMINOPHEN-BUTALBITAL 50-325 MG TABS Take 1 tablet by mouth 2 (two) times daily as needed (HEADACHES). 120 each 0  . azelastine (ASTELIN) 0.1 % nasal spray Place 2 sprays into both nostrils 2 (two) times daily. Use in each nostril as directed 30 mL 12  . BAYER MICROLET LANCETS lancets Use as directed once daily to check blood sugar.   DX E11.9 100 each 1  . Blood Glucose Monitoring Suppl (CONTOUR BLOOD GLUCOSE SYSTEM) DEVI Use daily to check blood sugar. DX E11.9 1 Device 0  . busPIRone (BUSPAR) 10 MG tablet TAKE 1/2 TO 1 TABLET(5 TO 10 MG) BY MOUTH THREE TIMES DAILY 90 tablet 2  . conjugated estrogens (PREMARIN) vaginal cream Place 1 Applicatorful vaginally once a week. 42.5 g 121  . CONTOUR NEXT TEST test strip USE TO CHECK BLOOD SUGAR DAILY 100 each 2  . cyclobenzaprine (FLEXERIL) 10 MG tablet TAKE 1 TABLET(10 MG) BY MOUTH TWICE DAILY AS NEEDED FOR MUSCLE SPASMS 60 tablet 0  . famotidine (PEPCID) 20 MG tablet Take 1 tablet (20 mg total) by mouth daily. 90 tablet 1  . fluconazole  (DIFLUCAN) 150 MG tablet Take 1 tablet (150 mg total) by mouth once a week. 3 tablet 0  . FLUoxetine (PROZAC) 20 MG capsule Take 1 capsule (20 mg total) by mouth daily. 90 capsule 3  . fluticasone (FLONASE) 50 MCG/ACT nasal spray Place 2 sprays into both nostrils daily. 16 g 6  . HYDROcodone-acetaminophen (NORCO/VICODIN) 5-325 MG tablet Take 1  tablet by mouth every 6 (six) hours as needed. Patient needs appointment for further refills. 100 tablet 0  . losartan (COZAAR) 50 MG tablet Take 1 tablet (50 mg total) by mouth daily. 90 tablet 2  . meloxicam (MOBIC) 15 MG tablet Take 1 tablet (15 mg total) by mouth daily as needed for pain. 90 tablet 1  . metFORMIN (GLUCOPHAGE) 500 MG tablet TAKE 2 TABLETS BY MOUTH TWICE DAILY 360 tablet 1  . montelukast (SINGULAIR) 10 MG tablet TAKE 1 TABLET(10 MG) BY MOUTH AT BEDTIME 90 tablet 1  . omeprazole (PRILOSEC) 20 MG capsule Take 1 capsule (20 mg total) by mouth 2 (two) times daily before a meal. 180 capsule 3  . promethazine (PHENERGAN) 25 MG tablet Take 1 tablet (25 mg total) by mouth every 8 (eight) hours as needed for nausea or vomiting. 20 tablet 0  . propranolol ER (INDERAL LA) 60 MG 24 hr capsule Take 2 capsules (120 mg total) by mouth daily. 180 capsule 3  . rosuvastatin (CRESTOR) 5 MG tablet Take 1 tablet (5 mg total) by mouth 3 (three) times a week. 45 tablet 1  . Saline 0.65 % (SOLN) SOLN Place 2 Squirts into the nose 2 (two) times daily. (Patient taking differently: Place 2 Squirts into the nose 3 (three) times a week. ) 1 Bottle 0  . SUMAtriptan (IMITREX) 50 MG tablet Take 1 tablet (50 mg total) by mouth every 2 (two) hours as needed for migraine. May repeat in 2 hours if headache persists or recurs. 10 tablet 3  . gabapentin (NEURONTIN) 100 MG capsule TAKE 1 CAPSULE(100 MG) BY MOUTH AT BEDTIME 30 capsule 0   No facility-administered medications prior to visit.     No Known Allergies  Review of Systems  Constitutional: Positive for  malaise/fatigue. Negative for fever.  HENT: Negative for congestion.   Eyes: Negative for blurred vision.  Respiratory: Negative for shortness of breath.   Cardiovascular: Negative for chest pain, palpitations and leg swelling.  Gastrointestinal: Negative for abdominal pain, blood in stool and nausea.  Genitourinary: Negative for dysuria and frequency.  Musculoskeletal: Positive for back pain and joint pain. Negative for falls.  Skin: Negative for rash.  Neurological: Negative for dizziness, loss of consciousness and headaches.  Endo/Heme/Allergies: Negative for environmental allergies.  Psychiatric/Behavioral: Negative for depression. The patient is nervous/anxious. The patient does not have insomnia.        Objective:    Physical Exam Constitutional:      Appearance: Normal appearance.  HENT:     Head: Normocephalic and atraumatic.     Nose: Nose normal.  Pulmonary:     Effort: Pulmonary effort is normal.  Neurological:     Mental Status: She is alert and oriented to person, place, and time.  Psychiatric:        Mood and Affect: Mood normal.        Behavior: Behavior normal.        Thought Content: Thought content normal.     LMP 01/08/2013  Wt Readings from Last 3 Encounters:  10/14/18 194 lb 3.2 oz (88.1 kg)  08/20/18 190 lb 6.4 oz (86.4 kg)  08/03/18 187 lb (84.8 kg)    Diabetic Foot Exam - Simple   No data filed     Lab Results  Component Value Date   WBC 7.2 01/14/2018   HGB 13.2 01/14/2018   HCT 41.0 01/14/2018   PLT 200.0 01/14/2018   GLUCOSE 93 01/14/2018   CHOL 159 01/14/2018  TRIG 155.0 (H) 01/14/2018   HDL 67.60 01/14/2018   LDLCALC 61 01/14/2018   ALT 16 01/14/2018   AST 11 01/14/2018   NA 137 01/14/2018   K 3.9 01/14/2018   CL 102 01/14/2018   CREATININE 0.68 01/14/2018   BUN 11 01/14/2018   CO2 22 01/14/2018   TSH 1.29 01/14/2018   HGBA1C 6.9 (H) 10/14/2018   MICROALBUR <0.7 09/17/2015    Lab Results  Component Value Date   TSH  1.29 01/14/2018   Lab Results  Component Value Date   WBC 7.2 01/14/2018   HGB 13.2 01/14/2018   HCT 41.0 01/14/2018   MCV 88.8 01/14/2018   PLT 200.0 01/14/2018   Lab Results  Component Value Date   NA 137 01/14/2018   K 3.9 01/14/2018   CO2 22 01/14/2018   GLUCOSE 93 01/14/2018   BUN 11 01/14/2018   CREATININE 0.68 01/14/2018   BILITOT 0.2 01/14/2018   ALKPHOS 143 (H) 01/14/2018   AST 11 01/14/2018   ALT 16 01/14/2018   PROT 7.4 01/14/2018   ALBUMIN 4.3 01/14/2018   CALCIUM 10.1 01/14/2018   GFR 121.09 01/14/2018   Lab Results  Component Value Date   CHOL 159 01/14/2018   Lab Results  Component Value Date   HDL 67.60 01/14/2018   Lab Results  Component Value Date   LDLCALC 61 01/14/2018   Lab Results  Component Value Date   TRIG 155.0 (H) 01/14/2018   Lab Results  Component Value Date   CHOLHDL 2 01/14/2018   Lab Results  Component Value Date   HGBA1C 6.9 (H) 10/14/2018       Assessment & Plan:   Problem List Items Addressed This Visit    Anxiety and depression    Working from home and managing her stress well, no changes at this time      Diabetes mellitus type 2 in obese (HCC)    hgba1c acceptable, minimize simple carbs. Increase exercise as tolerated. Continue current meds      HTN (hypertension)    Monitor BP at home and report any concerning symptoms.       Back pain    Struggling with daily pain, mostly right sided lower back pain with right LE radicular pain. Gabapentin at 100 mg was not helpful so is up to 400 to 500 mg and that is helping more. Will change to 300 mg caps, 1 cap po bid and 2 caps qhs and reevaluate         I have discontinued Linday Z. Schirm's gabapentin. I am also having her start on gabapentin. Additionally, I am having her maintain her Saline, Bayer Microlet Lancets, Contour Next Test, ACETAMINOPHEN-BUTALBITAL, CONTOUR BLOOD GLUCOSE SYSTEM, conjugated estrogens, cyclobenzaprine, meloxicam, SUMAtriptan,  propranolol ER, fluconazole, omeprazole, metFORMIN, losartan, rosuvastatin, busPIRone, promethazine, famotidine, fluticasone, azelastine, FLUoxetine, montelukast, and HYDROcodone-acetaminophen.  Meds ordered this encounter  Medications  . gabapentin (NEURONTIN) 300 MG capsule    Sig: 1 cap po bid and 2 caps  qhs    Dispense:  120 capsule    Refill:  3     I discussed the assessment and treatment plan with the patient. The patient was provided an opportunity to ask questions and all were answered. The patient agreed with the plan and demonstrated an understanding of the instructions.   The patient was advised to call back or seek an in-person evaluation if the symptoms worsen or if the condition fails to improve as anticipated.  I provided more than  20 minutes of non-face-to-face time during this encounter.   Danise EdgeStacey Cataleya Cristina, MD

## 2018-11-23 NOTE — Assessment & Plan Note (Signed)
Working from home and managing her stress well, no changes at this time

## 2018-11-23 NOTE — Assessment & Plan Note (Signed)
Struggling with daily pain, mostly right sided lower back pain with right LE radicular pain. Gabapentin at 100 mg was not helpful so is up to 400 to 500 mg and that is helping more. Will change to 300 mg caps, 1 cap po bid and 2 caps qhs and reevaluate

## 2018-11-23 NOTE — Assessment & Plan Note (Signed)
Monitor BP at home and report any concerning symptoms.

## 2018-11-23 NOTE — Telephone Encounter (Signed)
Left the pt a message to call back, to endorse med recommendation per Dr Delton See and Prior Auth Nurse Larita Fife.

## 2018-11-23 NOTE — Assessment & Plan Note (Signed)
hgba1c acceptable, minimize simple carbs. Increase exercise as tolerated. Continue current meds 

## 2018-11-26 ENCOUNTER — Telehealth: Payer: Self-pay | Admitting: Cardiology

## 2018-11-26 MED ORDER — PROPRANOLOL HCL 40 MG PO TABS: 40.0000 mg | ORAL_TABLET | Freq: Two times a day (BID) | ORAL | 11 refills | Status: DC

## 2018-11-26 NOTE — Telephone Encounter (Signed)
Follow up:    Patient returning your call concerning some medications. Please call patient back.

## 2018-11-26 NOTE — Telephone Encounter (Signed)
See 4/13 phone note. 

## 2018-11-26 NOTE — Telephone Encounter (Signed)
Returned patient's call (4/17 phone note).  Instructed the patient to switch to propranolol immediate release 40 mg BID. She was grateful for assistance.  Medication called in to requested pharmacy.

## 2018-12-02 ENCOUNTER — Other Ambulatory Visit: Payer: BC Managed Care – PPO

## 2018-12-02 ENCOUNTER — Telehealth: Payer: Self-pay | Admitting: Cardiology

## 2018-12-02 ENCOUNTER — Telehealth: Payer: Self-pay | Admitting: *Deleted

## 2018-12-02 NOTE — Telephone Encounter (Signed)
Returned call: Informed patient she had talked with [facility] July 10/2017 about getting a new cpap and she declined due to her financial status. Patient was never scheduled for a sleep study that Dr Nelson talked to her about. Contact information for choice was given to patient. Patient agrees with treatment and thanked me for call. 

## 2018-12-02 NOTE — Telephone Encounter (Signed)
Returned call: Informed patient she had talked with [facility] July 10/2017 about getting a new cpap and she declined due to her financial status. Patient was never scheduled for a sleep study that Dr Delton See talked to her about. Contact information for choice was given to patient. Patient agrees with treatment and thanked me for call.

## 2018-12-02 NOTE — Telephone Encounter (Signed)
New Message           Patient is calling for information for new equipment and she is needing information about the sleep study she is suppose to have done about 6 mths ago. Pls call to advise

## 2018-12-07 ENCOUNTER — Telehealth: Payer: Self-pay | Admitting: Family Medicine

## 2018-12-07 NOTE — Telephone Encounter (Signed)
Copied from CRM [id number]. Topic: Quick Communication - See Telephone Encounter >> Dec 07, 2018  4:22 PM Jay Schlichter wrote: CRM for notification. See Telephone encounter for: 12/07/18. Pt calling - she is not able to sleep and is asking for Remus Loffler regular (not extended) She says the gabapentin is not making her drowsy, and still having trouble sleeping.  She says she has had low grade fever for 4 days and sinus pressure, post nasal drip, cough at night. She currently does saline, flonase and either zyrtec or allegra in am, flonase and singulair in pm.  She says she is having moderate stomach pain after eating, no gallbladder.  Please call back with advice

## 2018-12-08 NOTE — Telephone Encounter (Signed)
Forwarded message to Dr. Abner Greenspan for advice on instructions for patient based on message dated 12/07/18.

## 2018-12-08 NOTE — Telephone Encounter (Signed)
She needs an appt due to so many concerns and will need to document need for Ambien if we prescribe

## 2018-12-09 ENCOUNTER — Telehealth (HOSPITAL_COMMUNITY): Payer: Self-pay | Admitting: Licensed Clinical Social Worker

## 2018-12-09 ENCOUNTER — Encounter: Payer: Self-pay | Admitting: *Deleted

## 2018-12-09 ENCOUNTER — Telehealth: Payer: Self-pay | Admitting: *Deleted

## 2018-12-09 ENCOUNTER — Other Ambulatory Visit: Payer: Self-pay

## 2018-12-09 ENCOUNTER — Encounter: Payer: Self-pay | Admitting: Cardiology

## 2018-12-09 ENCOUNTER — Ambulatory Visit (INDEPENDENT_AMBULATORY_CARE_PROVIDER_SITE_OTHER): Payer: 59 | Admitting: Family Medicine

## 2018-12-09 ENCOUNTER — Telehealth (INDEPENDENT_AMBULATORY_CARE_PROVIDER_SITE_OTHER): Payer: 59 | Admitting: Cardiology

## 2018-12-09 DIAGNOSIS — R000 Tachycardia, unspecified: Secondary | ICD-10-CM | POA: Diagnosis not present

## 2018-12-09 DIAGNOSIS — E782 Mixed hyperlipidemia: Secondary | ICD-10-CM

## 2018-12-09 DIAGNOSIS — I10 Essential (primary) hypertension: Secondary | ICD-10-CM | POA: Diagnosis not present

## 2018-12-09 DIAGNOSIS — R0789 Other chest pain: Secondary | ICD-10-CM | POA: Diagnosis not present

## 2018-12-09 DIAGNOSIS — R6889 Other general symptoms and signs: Secondary | ICD-10-CM

## 2018-12-09 DIAGNOSIS — G4700 Insomnia, unspecified: Secondary | ICD-10-CM

## 2018-12-09 DIAGNOSIS — Z20822 Contact with and (suspected) exposure to covid-19: Secondary | ICD-10-CM

## 2018-12-09 DIAGNOSIS — E669 Obesity, unspecified: Secondary | ICD-10-CM

## 2018-12-09 DIAGNOSIS — E1169 Type 2 diabetes mellitus with other specified complication: Secondary | ICD-10-CM | POA: Diagnosis not present

## 2018-12-09 DIAGNOSIS — R079 Chest pain, unspecified: Secondary | ICD-10-CM

## 2018-12-09 MED ORDER — ROSUVASTATIN CALCIUM 5 MG PO TABS: 5.0000 mg | ORAL_TABLET | ORAL | 3 refills | Status: DC

## 2018-12-09 MED ORDER — CEFDINIR 300 MG PO CAPS: 300.0000 mg | ORAL_CAPSULE | Freq: Two times a day (BID) | ORAL | 0 refills | Status: AC

## 2018-12-09 MED ORDER — ZOLPIDEM TARTRATE 10 MG PO TABS: 10.0000 mg | ORAL_TABLET | Freq: Every evening | ORAL | 3 refills | Status: DC | PRN

## 2018-12-09 NOTE — Telephone Encounter (Signed)
-----   Message from Jacqueline S Brennan, LCSW sent at 12/09/2018 11:37 AM EDT ----- Regarding: RE: BP CUFF FOR PT I ordered the cuff as well as added her to out [facility]. Thanks for the referral. Jackie Brennan  ----- Message ----- From: Martin, Ivy M, LPN Sent: 12/09/2018   9:13 AM EDT To: Jacqueline S Brennan, LCSW Subject: BP CUFF FOR PT                                 This is a heart pt of Dr Nelson's who we follow closely and we did a virtual video on her this morning and her BP cuff is broken and both she and her spouse have covid right now. Dr Nelson would like for her to be sent a BP cuff to her home address so that when we do the next virtual visit on her on 7/24 , she will have this accessible.  Can you help with this?  Thanks,  Ivy Dr. Nelson's Nurse   

## 2018-12-09 NOTE — Telephone Encounter (Signed)
CSW contacted patient to inquire about food insecurity during this health crisis. Patient reported need and agreeable to weekly delivery from [facility] [employer] [facility]. Patient informed that delivery will be left on front door with no face to face contact with delivery person. Patient agreeable to plan and grateful for the assistance. Jackie Sharniece Gibbon, LCSW, CCSW-MCS [phone] 

## 2018-12-09 NOTE — Patient Instructions (Addendum)
Medication Instructions:   Your physician recommends that you continue on your current medications as directed. Please refer to the Current Medication list given to you today.  If you need a refill on your cardiac medications before your next appointment, please call your pharmacy.     Follow-Up:  WITH DR Delton See AS ANOTHER VIRTUAL VIDEO VISIT ON 03/04/19 AT 10:00 AM--   Any Other Special Instructions Will Be Listed Below (If Applicable).  ALSO DR Delton See AND MYSELF REACHED OUT TO A SOCIAL WORKER IN THE [facility] TO ASSIST WITH GETTING YOU A BP CUFF TO YOUR HOME, SO THAT YOU CAN HAVE THIS AND UTILIZE THIS, PRIOR TO YOUR NEXT VIRTUAL VIDEO VISIT WITH OUR OFFICE AND ANY OTHER APPOINTMENTS YOU MAY HAVE WITH OTHER PHYSICIANS.  HER NAME IS [employer] LICENSED SOCIAL WORKER AT [facility]. SHE WILL BE REACHING OUT TO YOU SOON.

## 2018-12-09 NOTE — Telephone Encounter (Signed)
-----   Message from Marcy Siren, [place] sent at 12/09/2018 11:07 AM EDT ----- Regarding: RE: BP CUFF FOR PT Absolutely. I will order now. Thanks, Annice Pih ----- Message ----- From: Loa Socks, LPN Sent: [id number]   9:13 AM EDT To: Marcy Siren, LCSW Subject: BP CUFF FOR PT                                 This is a heart pt of Dr Lindaann Slough who we follow closely and we did a virtual video on her this morning and her BP cuff is broken and both she and her spouse have covid right now. Dr Delton See would like for her to be sent a BP cuff to her home address so that when we do the next virtual visit on her on 7/24 , she will have this accessible.  Can you help with this?  Thanks,  Dontay Harm Dr. Lindaann Slough Nurse

## 2018-12-09 NOTE — Progress Notes (Signed)
Virtual Visit via Video Note   This visit type was conducted due to national recommendations for restrictions regarding the COVID-19 Pandemic (e.g. social distancing) in an effort to limit this patient's exposure and mitigate transmission in our community.  Due to her co-morbid illnesses, this patient is at least at moderate risk for complications without adequate follow up.  This format is felt to be most appropriate for this patient at this time.  All issues noted in this document were discussed and addressed.  A limited physical exam was performed with this format.  Please refer to the patient's chart for her consent to telehealth for [facility].   Evaluation Performed:  Follow-up visit  Date:  12/09/2018   ID:  Nicole Ball, DOB 1975/08/23, MRN [id number]  Patient Location: Home Provider Location: Office  PCP:  [name], MD  Cardiologist:  Tobias Alexander, MD  Electrophysiologist:  None   Chief Complaint: Chest pain, fever, myalgias, headache.  History of Present Illness:    Nicole Ball is a 43 y.o. female with hx of h/o DM 2 (for the last 2-3 years with PCOS and early menopause), HTN, anxiety who has noticed palpitations and tachycardia in March 2016, she was started on metoprolol and her symptoms improved however she continues to be tachycardic. They are very uncomfortable, but no dizziness or syncope.  She was recently diagnosed with fatty liver and gallbladder stone and she is supposed to see a surgeon the next week.  She is undergoing significant stress with her husband who is also our patient requires myectomy for HCM. H/O MI in grandparents.  08/20/2018 -the patient is coming after 3 years, in a interim she was seen by Dr. Mayford Knife for sleep apnea and is using CPAP machine that is helping significantly.  She was also seen by Dr. 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 She  states that lately she has occasional palpitations few times a week they are not associated with other symptoms such as presyncope or syncope.  She stopped taking atorvastatin as she developed significant myalgias.  She has recently changed job she is now Interior and spatial designer of counseling at [employer] and she is hoping that the job will be less stressful.  She feels that 1 of her medicines is changing texture of her hair.  12/09/2018 -the patient reports that in the last 7 days she has been having fever chills headaches profound myalgias associated with chest pain radiating to her back and shortness of breath, however since last night she is afebrile and feels slightly better.  Since the last we did an adjustment of her medications she has been feeling otherwise significantly better with resolution of palpitations.  No chest pain prior to these febrile episodes.  Her husband also went to the ER last night with symptoms suspicious for COVID-19 infection.  He was not tested.  The patient does have symptoms concerning for COVID-19 infection (fever, chills, cough, or new shortness of breath).   Past Medical History:  Diagnosis Date  . Abnormal serum level of alkaline phosphatase 05/21/2014  . Acute low back pain 12/19/2010  . ALLERGIC RHINITIS 10/16/2010  . Allergy    seasonal  . Anxiety and depression 06/07/2011  . Atrophic vaginitis 08/17/2016  . Cervical cancer screening 01/10/2014   Menarche at 8 Regular and moderate flow, became irregular until OCPs  history of abnormal pap in past, repeat was normal Last pap roughly 3  years ago G0P0, s/p No history of abnormal MGM, no h/o MGM  No concerns today no gyn surgeries LMP May 2013  . CTS (carpal tunnel syndrome) 01/15/2014  . Diabetes mellitus type 2 in obese (HCC) 10/30/2011  . Dry eyes 08/17/2016  . Early menopause   . Elevated BP 04/21/2011  . Essential hypertension   . HIATAL HERNIA WITH REFLUX, HX OF 10/16/2010  . Hyperlipidemia 04/21/2011  . Inappropriate  sinus tachycardia   . INSOMNIA 10/16/2010  . Knee pain, left 03/11/2012  . Migraine 03/07/2013  . Obesity   . Obesity (BMI 30-39.9) 01/12/2018  . Palpitations 01/28/2015  . Reflux 04/22/2012  . Sleep apnea 06/07/2011  . Tobacco use disorder 02/10/2017  . Urinary urgency 08/17/2016  . UTI'S, HX OF 10/16/2010   Past Surgical History:  Procedure Laterality Date  . CHOLECYSTECTOMY       Current Meds  Medication Sig  . ACETAMINOPHEN-BUTALBITAL 50-325 MG TABS Take 1 tablet by mouth 2 (two) times daily as needed (HEADACHES).  Marland Kitchen. azelastine (ASTELIN) 0.1 % nasal spray Place 2 sprays into both nostrils 2 (two) times daily. Use in each nostril as directed  . BAYER MICROLET LANCETS lancets Use as directed once daily to check blood sugar.   DX E11.9  . Blood Glucose Monitoring Suppl (CONTOUR BLOOD GLUCOSE SYSTEM) DEVI Use daily to check blood sugar. DX E11.9  . busPIRone (BUSPAR) 10 MG tablet TAKE 1/2 TO 1 TABLET(5 TO 10 MG) BY MOUTH THREE TIMES DAILY  . conjugated estrogens (PREMARIN) vaginal cream Place 1 Applicatorful vaginally once a week.  . CONTOUR NEXT TEST test strip USE TO CHECK BLOOD SUGAR DAILY  . cyclobenzaprine (FLEXERIL) 10 MG tablet TAKE 1 TABLET(10 MG) BY MOUTH TWICE DAILY AS NEEDED FOR MUSCLE SPASMS  . famotidine (PEPCID) 20 MG tablet Take 1 tablet (20 mg total) by mouth daily.  . fluconazole (DIFLUCAN) 150 MG tablet Take 150 mg by mouth as needed.  Marland Kitchen. FLUoxetine HCl 60 MG TABS Take 60 mg by mouth daily.  . fluticasone (FLONASE) 50 MCG/ACT nasal spray Place 2 sprays into both nostrils daily.  Marland Kitchen. gabapentin (NEURONTIN) 300 MG capsule Take 300 mg by mouth 2 (two) times daily.  Marland Kitchen. HYDROcodone-acetaminophen (NORCO/VICODIN) 5-325 MG tablet Take 1 tablet by mouth every 6 (six) hours as needed. Patient needs appointment for further refills.  Marland Kitchen. losartan (COZAAR) 50 MG tablet Take 1 tablet (50 mg total) by mouth daily.  . meloxicam (MOBIC) 15 MG tablet Take 1 tablet (15 mg total) by mouth daily as needed  for pain.  . metFORMIN (GLUCOPHAGE) 500 MG tablet TAKE 2 TABLETS BY MOUTH TWICE DAILY  . montelukast (SINGULAIR) 10 MG tablet TAKE 1 TABLET(10 MG) BY MOUTH AT BEDTIME  . omeprazole (PRILOSEC) 20 MG capsule Take 1 capsule (20 mg total) by mouth 2 (two) times daily before a meal.  . promethazine (PHENERGAN) 25 MG tablet Take 1 tablet (25 mg total) by mouth every 8 (eight) hours as needed for nausea or vomiting.  . propranolol (INDERAL) 40 MG tablet Take 1 tablet (40 mg total) by mouth 2 (two) times daily.  . sodium chloride (OCEAN) 0.65 % SOLN nasal spray Place 1 spray into both nostrils daily.  . SUMAtriptan (IMITREX) 50 MG tablet Take 1 tablet (50 mg total) by mouth every 2 (two) hours as needed for migraine. May repeat in 2 hours if headache persists or recurs.  . [DISCONTINUED] fluconazole (DIFLUCAN) 150 MG tablet Take 1 tablet (150 mg total) by mouth once  a week. (Patient taking differently: Take 150 mg by mouth as needed. )  . [DISCONTINUED] Saline 0.65 % (SOLN) SOLN Place 2 Squirts into the nose 2 (two) times daily. (Patient taking differently: Place 2 Squirts into the nose 3 (three) times a week. )     Allergies:   Patient has no known allergies.   Social History   Tobacco Use  . Smoking status: Current Some Day Smoker    Packs/day: 0.10    Types: Cigars  . Smokeless tobacco: Never Used  . Tobacco comment: 1 black and milds  Substance Use Topics  . Alcohol use: Yes    Comment: twice/year  . Drug use: No     Family Hx: The patient's family history includes Allergies in her brother; Arthritis in her maternal grandmother and another family member; Asthma in her sister; Breast cancer in her paternal grandmother; Cancer in her father, maternal grandfather, and paternal grandmother; Cataracts in her maternal grandmother; Coronary artery disease in her maternal grandfather; Diabetes in her maternal grandfather and paternal grandfather; Glaucoma in her maternal grandmother; Lupus in her  mother; Scleroderma in her maternal grandmother; Thyroid disease in her paternal grandmother and sister.  ROS:   Please see the history of present illness.    All other systems reviewed and are negative.   Prior CV studies:   The following studies were reviewed today:  Labs/Other Tests and Data Reviewed:    EKG:  No ECG reviewed.  Recent Labs: 01/14/2018: ALT 16; BUN 11; Creatinine, Ser 0.68; Hemoglobin 13.2; Platelets 200.0; Potassium 3.9; Sodium 137; TSH 1.29   Recent Lipid Panel Lab Results  Component Value Date/Time   CHOL 159 01/14/2018 12:21 PM   TRIG 155.0 (H) 01/14/2018 12:21 PM   HDL 67.60 01/14/2018 12:21 PM   CHOLHDL 2 01/14/2018 12:21 PM   LDLCALC 61 01/14/2018 12:21 PM    Wt Readings from Last 3 Encounters:  12/09/18 195 lb (88.5 kg)  10/14/18 194 lb 3.2 oz (88.1 kg)  08/20/18 190 lb 6.4 oz (86.4 kg)     Objective:    Vital Signs:  Ht 5\' 3"  (1.6 m)   Wt 195 lb (88.5 kg)   LMP 01/08/2013   BMI 34.54 kg/m    VITAL SIGNS:  reviewed  ASSESSMENT & PLAN:    1. Inappropriate sinus tachycardia  - her palpitations are now well controlled, will continue same dose of propranolol.  2. Hypertension  -we increase losartan to 50 mg daily at the last visit, she felt better, however she does not have a blood pressure cuff at home, we will try to arrange one for her.   3. Hyperlipidemia  - start rosuvastatin 5 mg 3 times a week check CMP, lipids prior to next visit in 3 months. 4. Suspicion for COVID-19 infection -in a patient and her husband, she is educated about potential complications in advised to seek medical cares if her symptoms worsen.  COVID-19 Education: The signs and symptoms of COVID-19 were discussed with the patient and how to seek care for testing (follow up with PCP or arrange E-visit).  The importance of social distancing was discussed today.  Time:   Today, I have spent 25 minutes with the patient with telehealth technology discussing the above  problems.     Medication Adjustments/Labs and Tests Ordered: Current medicines are reviewed at length with the patient today.  Concerns regarding medicines are outlined above.   Tests Ordered: No orders of the defined types were placed in this  encounter.   Medication Changes: No orders of the defined types were placed in this encounter.   Disposition:  Follow up in 3 month(s)  Signed, Tobias Alexander, MD  12/09/2018 8:51 AM    [facility]

## 2018-12-09 NOTE — Telephone Encounter (Signed)
-----   Message from Marcy Siren, [place] sent at 12/09/2018 11:37 AM EDT ----- Regarding: RE: BP CUFF FOR PT I ordered the cuff as well as added her to out CV Food program. Thanks for the referral. Lasandra Beech  ----- Message ----- From: Loa Socks, LPN Sent: [id number]   9:13 AM EDT To: Marcy Siren, LCSW Subject: BP CUFF FOR PT                                 This is a heart pt of Dr Lindaann Slough who we follow closely and we did a virtual video on her this morning and her BP cuff is broken and both she and her spouse have covid right now. Dr Delton See would like for her to be sent a BP cuff to her home address so that when we do the next virtual visit on her on 7/24 , she will have this accessible.  Can you help with this?  Thanks,  Necole Minassian Dr. Lindaann Slough Nurse

## 2018-12-09 NOTE — Telephone Encounter (Signed)
CSW referred to assist patient with obtaining a BP cuff. CSW contacted patient to inform cuff will be delivered to her home next week. Patient grateful for support and assistance. CSW available as needed. Jackie Mckenzye Cutright, LCSW, CCSW-MCS [phone]  

## 2018-12-10 ENCOUNTER — Other Ambulatory Visit: Payer: Self-pay | Admitting: Family Medicine

## 2018-12-12 DIAGNOSIS — Z20822 Contact with and (suspected) exposure to covid-19: Secondary | ICD-10-CM | POA: Insufficient documentation

## 2018-12-12 DIAGNOSIS — R6889 Other general symptoms and signs: Secondary | ICD-10-CM

## 2018-12-12 NOTE — Progress Notes (Signed)
Virtual Visit via Video Note  I connected with Nicole Ball on 12/12/18 at  9:20 AM EDT by a video enabled telemedicine application and verified that I am speaking with the correct person using two identifiers.  Location: Patient: home Provider: home   I discussed the limitations of evaluation and management by telemedicine and the availability of in person appointments. The patient expressed understanding and agreed to proceed.    Subjective:    Patient ID: Nicole Ball, female    DOB: [birth year], 43 y.o.   MRN: [id number]  No chief complaint on file.   HPI Patient is in today for evaluation of respiratory illness and chronic medical concerns including hypertension, hyperlipidemia and hyperglycemia. Is noting 2 weeks of not feeling well with fevers and chills used Tylenol with some relief temporarily. Noted head and chest congestion, headaches, myalgias, fatigue, diarrhea, nausea and some vomiting. Is noting most of these symptoms are resolved or greatly improved. She notes some headache, fatigue and myalgias still present. Is having trouble sleeping more than usual. Denies palp/SOB or GU c/o. Taking meds as prescribed  Past Medical History:  Diagnosis Date  . Abnormal serum level of alkaline phosphatase 05/21/2014  . Acute low back pain 12/19/2010  . ALLERGIC RHINITIS 10/16/2010  . Allergy    seasonal  . Anxiety and depression 06/07/2011  . Atrophic vaginitis 08/17/2016  . Cervical cancer screening 01/10/2014   Menarche at 8 Regular and moderate flow, became irregular until OCPs  history of abnormal pap in past, repeat was normal Last pap roughly 3 years ago G0P0, s/p No history of abnormal MGM, no h/o MGM  No concerns today no gyn surgeries LMP May 2013  . CTS (carpal tunnel syndrome) 01/15/2014  . Diabetes mellitus type 2 in obese (HCC) 10/30/2011  . Dry eyes 08/17/2016  . Early menopause   . Elevated BP 04/21/2011  . Essential hypertension   . HIATAL HERNIA WITH REFLUX, HX OF  10/16/2010  . Hyperlipidemia 04/21/2011  . Inappropriate sinus tachycardia   . INSOMNIA 10/16/2010  . Knee pain, left 03/11/2012  . Migraine 03/07/2013  . Obesity   . Obesity (BMI 30-39.9) 01/12/2018  . Palpitations 01/28/2015  . Reflux 04/22/2012  . Sleep apnea 06/07/2011  . Tobacco use disorder 02/10/2017  . Urinary urgency 08/17/2016  . UTI'S, HX OF 10/16/2010    Past Surgical History:  Procedure Laterality Date  . CHOLECYSTECTOMY      Family History  Problem Relation Age of Onset  . Lupus Mother   . Arthritis Maternal Grandmother   . Scleroderma Maternal Grandmother   . Glaucoma Maternal Grandmother   . Cataracts Maternal Grandmother   . Cancer Maternal Grandfather        prostate  . Coronary artery disease Maternal Grandfather        s/p angioplasty  . Diabetes Maternal Grandfather        Type 2  . Cancer Paternal Grandmother        Breast ca- dx in 75's  . Thyroid disease Paternal Grandmother   . Breast cancer Paternal Grandmother   . Diabetes Paternal Grandfather   . Thyroid disease Sister   . Asthma Sister   . Allergies Brother   . Cancer Father        cigarette, alcohol HEENT squamous cell  . Arthritis Other     Social History   Socioeconomic History  . Marital status: Married    Spouse name: Not on file  . Number of children:  Not on file  . Years of education: Not on file  . Highest education level: Not on file  Occupational History  . Not on file  Social Needs  . Financial resource strain: Not on file  . Food insecurity:    Worry: Not on file    Inability: Not on file  . Transportation needs:    Medical: Not on file    Non-medical: Not on file  Tobacco Use  . Smoking status: Current Some Day Smoker    Packs/day: 0.10    Types: Cigars  . Smokeless tobacco: Never Used  . Tobacco comment: 1 black and milds  Substance and Sexual Activity  . Alcohol use: Yes    Comment: twice/year  . Drug use: No  . Sexual activity: Not on file  Lifestyle  . Physical  activity:    Days per week: Not on file    Minutes per session: Not on file  . Stress: Not on file  Relationships  . Social connections:    Talks on phone: Not on file    Gets together: Not on file    Attends religious service: Not on file    Active member of club or organization: Not on file    Attends meetings of clubs or organizations: Not on file    Relationship status: Not on file  . Intimate partner violence:    Fear of current or ex partner: Not on file    Emotionally abused: Not on file    Physically abused: Not on file    Forced sexual activity: Not on file  Other Topics Concern  . Not on file  Social History Narrative   Married lives w/ husband, works at [employer]    Outpatient Medications Prior to Visit  Medication Sig Dispense Refill  . ACETAMINOPHEN-BUTALBITAL 50-325 MG TABS Take 1 tablet by mouth 2 (two) times daily as needed (HEADACHES). 120 each 0  . azelastine (ASTELIN) 0.1 % nasal spray Place 2 sprays into both nostrils 2 (two) times daily. Use in each nostril as directed 30 mL 12  . BAYER MICROLET LANCETS lancets Use as directed once daily to check blood sugar.   DX E11.9 100 each 1  . Blood Glucose Monitoring Suppl (CONTOUR BLOOD GLUCOSE SYSTEM) DEVI Use daily to check blood sugar. DX E11.9 1 Device 0  . busPIRone (BUSPAR) 10 MG tablet TAKE 1/2 TO 1 TABLET(5 TO 10 MG) BY MOUTH THREE TIMES DAILY 90 tablet 2  . conjugated estrogens (PREMARIN) vaginal cream Place 1 Applicatorful vaginally once a week. 42.5 g 121  . CONTOUR NEXT TEST test strip USE TO CHECK BLOOD SUGAR DAILY 100 each 2  . cyclobenzaprine (FLEXERIL) 10 MG tablet TAKE 1 TABLET(10 MG) BY MOUTH TWICE DAILY AS NEEDED FOR MUSCLE SPASMS 60 tablet 0  . famotidine (PEPCID) 20 MG tablet Take 1 tablet (20 mg total) by mouth daily. 90 tablet 1  . FLUoxetine HCl 60 MG TABS Take 60 mg by mouth daily.    . fluticasone (FLONASE) 50 MCG/ACT nasal spray Place 2 sprays into both nostrils daily. 16 g 6  .  HYDROcodone-acetaminophen (NORCO/VICODIN) 5-325 MG tablet Take 1 tablet by mouth every 6 (six) hours as needed. Patient needs appointment for further refills. 100 tablet 0  . losartan (COZAAR) 50 MG tablet Take 1 tablet (50 mg total) by mouth daily. 90 tablet 2  . meloxicam (MOBIC) 15 MG tablet Take 1 tablet (15 mg total) by mouth daily as needed for pain. 90  tablet 1  . metFORMIN (GLUCOPHAGE) 500 MG tablet TAKE 2 TABLETS BY MOUTH TWICE DAILY 360 tablet 1  . montelukast (SINGULAIR) 10 MG tablet TAKE 1 TABLET(10 MG) BY MOUTH AT BEDTIME 90 tablet 1  . omeprazole (PRILOSEC) 20 MG capsule Take 1 capsule (20 mg total) by mouth 2 (two) times daily before a meal. 180 capsule 3  . promethazine (PHENERGAN) 25 MG tablet Take 1 tablet (25 mg total) by mouth every 8 (eight) hours as needed for nausea or vomiting. 20 tablet 0  . propranolol (INDERAL) 40 MG tablet Take 1 tablet (40 mg total) by mouth 2 (two) times daily. 60 tablet 11  . rosuvastatin (CRESTOR) 5 MG tablet Take 1 tablet (5 mg total) by mouth 3 (three) times a week. 45 tablet 3  . sodium chloride (OCEAN) 0.65 % SOLN nasal spray Place 1 spray into both nostrils daily.    . SUMAtriptan (IMITREX) 50 MG tablet Take 1 tablet (50 mg total) by mouth every 2 (two) hours as needed for migraine. May repeat in 2 hours if headache persists or recurs. 10 tablet 3   No facility-administered medications prior to visit.     No Known Allergies  Review of Systems  Constitutional: Positive for malaise/fatigue. Negative for fever.  HENT: Positive for congestion and sinus pain.   Eyes: Negative for blurred vision.  Respiratory: Positive for cough and sputum production. Negative for shortness of breath.   Cardiovascular: Positive for chest pain. Negative for palpitations and leg swelling.  Gastrointestinal: Negative for abdominal pain, blood in stool and nausea.  Genitourinary: Negative for dysuria and frequency.  Musculoskeletal: Positive for myalgias. Negative  for falls.  Skin: Negative for rash.  Neurological: Positive for headaches. Negative for dizziness and loss of consciousness.  Endo/Heme/Allergies: Negative for environmental allergies.  Psychiatric/Behavioral: Negative for depression. The patient is not nervous/anxious.        Objective:    Physical Exam Constitutional:      Appearance: Normal appearance. She is not ill-appearing.  HENT:     Head: Normocephalic and atraumatic.     Nose: Nose normal.  Pulmonary:     Effort: Pulmonary effort is normal.  Neurological:     Mental Status: She is alert and oriented to person, place, and time.  Psychiatric:        Mood and Affect: Mood normal.        Behavior: Behavior normal.     LMP 01/08/2013  Wt Readings from Last 3 Encounters:  12/09/18 195 lb (88.5 kg)  10/14/18 194 lb 3.2 oz (88.1 kg)  08/20/18 190 lb 6.4 oz (86.4 kg)    Diabetic Foot Exam - Simple   No data filed     Lab Results  Component Value Date   WBC 7.2 01/14/2018   HGB 13.2 01/14/2018   HCT 41.0 01/14/2018   PLT 200.0 01/14/2018   GLUCOSE 93 01/14/2018   CHOL 159 01/14/2018   TRIG 155.0 (H) 01/14/2018   HDL 67.60 01/14/2018   LDLCALC 61 01/14/2018   ALT 16 01/14/2018   AST 11 01/14/2018   NA 137 01/14/2018   K 3.9 01/14/2018   CL 102 01/14/2018   CREATININE 0.68 01/14/2018   BUN 11 01/14/2018   CO2 22 01/14/2018   TSH 1.29 01/14/2018   HGBA1C 6.9 (H) 10/14/2018   MICROALBUR <0.7 09/17/2015    Lab Results  Component Value Date   TSH 1.29 01/14/2018   Lab Results  Component Value Date   WBC 7.2  01/14/2018   HGB 13.2 01/14/2018   HCT 41.0 01/14/2018   MCV 88.8 01/14/2018   PLT 200.0 01/14/2018   Lab Results  Component Value Date   NA 137 01/14/2018   K 3.9 01/14/2018   CO2 22 01/14/2018   GLUCOSE 93 01/14/2018   BUN 11 01/14/2018   CREATININE 0.68 01/14/2018   BILITOT 0.2 01/14/2018   ALKPHOS 143 (H) 01/14/2018   AST 11 01/14/2018   ALT 16 01/14/2018   PROT 7.4 01/14/2018    ALBUMIN 4.3 01/14/2018   CALCIUM 10.1 01/14/2018   GFR 121.09 01/14/2018   Lab Results  Component Value Date   CHOL 159 01/14/2018   Lab Results  Component Value Date   HDL 67.60 01/14/2018   Lab Results  Component Value Date   LDLCALC 61 01/14/2018   Lab Results  Component Value Date   TRIG 155.0 (H) 01/14/2018   Lab Results  Component Value Date   CHOLHDL 2 01/14/2018   Lab Results  Component Value Date   HGBA1C 6.9 (H) 10/14/2018       Assessment & Plan:   Problem List Items Addressed This Visit    Insomnia    Encouraged good sleep hygiene such as dark, quiet room. No blue/green glowing lights such as computer screens in bedroom. No alcohol or stimulants in evening. Cut down on caffeine as able. Regular exercise is helpful but not just prior to bed time. ambien prn       Hyperlipidemia    Encouraged heart healthy diet, increase exercise, avoid trans fats, consider a krill oil cap daily      Diabetes mellitus type 2 in obese (HCC)    hgba1c acceptable, minimize simple carbs. Increase exercise as tolerated. Continue current meds      HTN (hypertension)     no changes to meds. Encouraged heart healthy diet such as the DASH diet and exercise as tolerated.       Suspected Covid-19 Virus Infection    Has been feeling poorly for 2 weeks but is improving. Has had a cough, 7 days of fever, head anc chest congesiton, diarrhea, nausea and a small amount of vomiting. Notes some headache and myalgias as well. Encouraged rest, hydration, Vitamin C, Zinc, sodium Bicarb 1/4 tsp tid, deep breathing hourly. Call if any concerns         I am having Adilenne Z. Whitehair start on zolpidem and cefdinir. I am also having her maintain her Bayer Microlet Lancets, Contour Next Test, ACETAMINOPHEN-BUTALBITAL, CONTOUR BLOOD GLUCOSE SYSTEM, conjugated estrogens, cyclobenzaprine, meloxicam, SUMAtriptan, omeprazole, metFORMIN, losartan, busPIRone, promethazine, famotidine, fluticasone,  azelastine, montelukast, HYDROcodone-acetaminophen, propranolol, FLUoxetine HCl, sodium chloride, and rosuvastatin.  Meds ordered this encounter  Medications  . zolpidem (AMBIEN) 10 MG tablet    Sig: Take 1 tablet (10 mg total) by mouth at bedtime as needed for up to 30 days for sleep.    Dispense:  30 tablet    Refill:  3  . cefdinir (OMNICEF) 300 MG capsule    Sig: Take 1 capsule (300 mg total) by mouth 2 (two) times daily for 10 days.    Dispense:  20 capsule    Refill:  0    I discussed the assessment and treatment plan with the patient. The patient was provided an opportunity to ask questions and all were answered. The patient agreed with the plan and demonstrated an understanding of the instructions.   The patient was advised to call back or seek an in-person evaluation if  the symptoms worsen or if the condition fails to improve as anticipated.  I provided 25 minutes of non-face-to-face time during this encounter.   Penni Homans, MD

## 2018-12-12 NOTE — Assessment & Plan Note (Signed)
Encouraged good sleep hygiene such as dark, quiet room. No blue/green glowing lights such as computer screens in bedroom. No alcohol or stimulants in evening. Cut down on caffeine as able. Regular exercise is helpful but not just prior to bed time. ambien prn 

## 2018-12-12 NOTE — Assessment & Plan Note (Signed)
no changes to meds. Encouraged heart healthy diet such as the DASH diet and exercise as tolerated.  

## 2018-12-12 NOTE — Assessment & Plan Note (Signed)
hgba1c acceptable, minimize simple carbs. Increase exercise as tolerated. Continue current meds 

## 2018-12-12 NOTE — Assessment & Plan Note (Addendum)
Has been feeling poorly for 2 weeks but is improving. Has had a cough, 7 days of fever, head anc chest congesiton, diarrhea, nausea and a small amount of vomiting. Notes some headache and myalgias as well. Encouraged rest, hydration, Vitamin C, Zinc, sodium Bicarb 1/4 tsp tid, deep breathing hourly. Call if any concerns

## 2018-12-12 NOTE — Assessment & Plan Note (Signed)
Encouraged heart healthy diet, increase exercise, avoid trans fats, consider a krill oil cap daily 

## 2018-12-14 ENCOUNTER — Other Ambulatory Visit: Payer: Self-pay | Admitting: Family Medicine

## 2018-12-14 DIAGNOSIS — M549 Dorsalgia, unspecified: Secondary | ICD-10-CM

## 2018-12-15 ENCOUNTER — Ambulatory Visit: Payer: 59 | Admitting: Family Medicine

## 2018-12-17 ENCOUNTER — Telehealth: Payer: Self-pay | Admitting: Licensed Clinical Social Worker

## 2018-12-17 NOTE — Telephone Encounter (Signed)
CSW contacted patient to follow up on weekly food delivery package. Patient informed of delivery time and no face to face contact during delivery. Patient verbalizes understanding and grateful for the assistance.  CSW continues to follow for supportive needs. Jackie Anita Laguna, LCSW, CCSW-MCS [phone]  

## 2018-12-22 ENCOUNTER — Ambulatory Visit: Payer: 59 | Admitting: Family Medicine

## 2018-12-23 ENCOUNTER — Other Ambulatory Visit: Payer: Self-pay | Admitting: Family Medicine

## 2018-12-23 DIAGNOSIS — M25562 Pain in left knee: Secondary | ICD-10-CM

## 2018-12-24 ENCOUNTER — Telehealth: Payer: Self-pay | Admitting: Licensed Clinical Social Worker

## 2018-12-24 MED ORDER — PROMETHAZINE HCL 25 MG PO TABS: 25.0000 mg | ORAL_TABLET | Freq: Three times a day (TID) | ORAL | 0 refills | Status: DC | PRN

## 2018-12-24 MED ORDER — HYDROCODONE-ACETAMINOPHEN 5-325 MG PO TABS: 1.0000 | ORAL_TABLET | Freq: Four times a day (QID) | ORAL | 0 refills | Status: DC | PRN

## 2018-12-24 NOTE — Telephone Encounter (Signed)
CSW contacted patient to follow up on weekly food delivery package. Patient informed of delivery time and no face to face contact during delivery. Message left as no answer.  CSW continues to follow for supportive needs. Jackie Edvin Albus, LCSW, CCSW-MCS [phone] 

## 2018-12-30 ENCOUNTER — Telehealth: Payer: Self-pay | Admitting: Licensed Clinical Social Worker

## 2018-12-30 ENCOUNTER — Other Ambulatory Visit: Payer: Self-pay

## 2018-12-30 ENCOUNTER — Ambulatory Visit: Payer: 59 | Admitting: Family Medicine

## 2018-12-30 NOTE — Telephone Encounter (Signed)
CSW contacted patient to follow up on weekly food delivery package. Patient informed of delivery time and no face to face contact during delivery. Message left as no answer.  CSW continues to follow for supportive needs. Jackie Raychell Holcomb, LCSW, CCSW-MCS [phone] 

## 2019-01-06 ENCOUNTER — Ambulatory Visit (INDEPENDENT_AMBULATORY_CARE_PROVIDER_SITE_OTHER): Payer: 59 | Admitting: Family Medicine

## 2019-01-06 ENCOUNTER — Other Ambulatory Visit: Payer: Self-pay

## 2019-01-06 DIAGNOSIS — E669 Obesity, unspecified: Secondary | ICD-10-CM | POA: Diagnosis not present

## 2019-01-06 DIAGNOSIS — K219 Gastro-esophageal reflux disease without esophagitis: Secondary | ICD-10-CM

## 2019-01-06 DIAGNOSIS — E1169 Type 2 diabetes mellitus with other specified complication: Secondary | ICD-10-CM | POA: Diagnosis not present

## 2019-01-06 DIAGNOSIS — I10 Essential (primary) hypertension: Secondary | ICD-10-CM | POA: Diagnosis not present

## 2019-01-06 DIAGNOSIS — K449 Diaphragmatic hernia without obstruction or gangrene: Secondary | ICD-10-CM

## 2019-01-06 DIAGNOSIS — R109 Unspecified abdominal pain: Secondary | ICD-10-CM

## 2019-01-06 MED ORDER — HYOSCYAMINE SULFATE 0.125 MG SL SUBL: 0.1250 mg | SUBLINGUAL_TABLET | SUBLINGUAL | 0 refills | Status: DC | PRN

## 2019-01-06 NOTE — Assessment & Plan Note (Signed)
Avoid offending foods, start probiotics. Do not eat large meals in late evening and consider raising head of bed.  

## 2019-01-06 NOTE — Assessment & Plan Note (Signed)
no changes to meds. Encouraged heart healthy diet such as the DASH diet and exercise as tolerated.  

## 2019-01-06 NOTE — Assessment & Plan Note (Signed)
Encouraged DASH diet, decrease po intake and increase exercise as tolerated. Needs 7-8 hours of sleep nightly. Avoid trans fats, eat small, frequent meals every 4-5 hours with lean proteins, complex carbs and healthy fats. Minimize simple carbs, weight watchers APP

## 2019-01-07 ENCOUNTER — Telehealth: Payer: Self-pay | Admitting: Licensed Clinical Social Worker

## 2019-01-07 NOTE — Telephone Encounter (Signed)
CSW contacted patient to follow up on weekly food delivery package. Patient informed of no face to face contact during delivery. CSW discussed transition option for food delivery as the Covid 19 Food relief program will be ending on January 21, 2019. Patient verbalizes understanding and grateful for the assistance.  CSW continues to follow for supportive needs. Jackie Cyra Spader, LCSW, CCSW-MCS [phone] 

## 2019-01-08 ENCOUNTER — Other Ambulatory Visit: Payer: Self-pay | Admitting: Family Medicine

## 2019-01-09 DIAGNOSIS — R109 Unspecified abdominal pain: Secondary | ICD-10-CM | POA: Insufficient documentation

## 2019-01-09 NOTE — Assessment & Plan Note (Signed)
Avoid offending foods, small and frequent meals. Given an rx for Hyoscyamine to try prn. If symptoms worsen or change so we can attempt further work up and possible referral.

## 2019-01-09 NOTE — Progress Notes (Signed)
Virtual Visit via Video Note  I connected with Nicole Ball on 01/06/19 at  1:20 PM EDT by a video enabled telemedicine application and verified that I am speaking with the correct person using two identifiers.  Location: Patient: car Provider: office   I discussed the limitations of evaluation and management by telemedicine and the availability of in person appointments. The patient expressed understanding and agreed to proceed. [name] Montez Morita CMA was able to get patient set up on video platform    Subjective:    Patient ID: Nicole Ball, female    DOB: 01/04/1976, 43 y.o.   MRN: [id number]  No chief complaint on file.   HPI Patient is in today for follow up on chronic medical concerns including hypertension, diabetes and is also noting worsening reflux and intermittent abdominal pain. She denies any obvious fevers or chills. No bloody or tarry stool but has some sharp, cramp type pains in abdomin intermittently. Denies CP/palp/SOB/HA/congestion/fevers or GU c/o. Taking meds as prescribed  Past Medical History:  Diagnosis Date  . Abnormal serum level of alkaline phosphatase 05/21/2014  . Acute low back pain 12/19/2010  . ALLERGIC RHINITIS 10/16/2010  . Allergy    seasonal  . Anxiety and depression 06/07/2011  . Atrophic vaginitis 08/17/2016  . Cervical cancer screening 01/10/2014   Menarche at 8 Regular and moderate flow, became irregular until OCPs  history of abnormal pap in past, repeat was normal Last pap roughly 3 years ago G0P0, s/p No history of abnormal MGM, no h/o MGM  No concerns today no gyn surgeries LMP May 2013  . CTS (carpal tunnel syndrome) 01/15/2014  . Diabetes mellitus type 2 in obese (HCC) 10/30/2011  . Dry eyes 08/17/2016  . Early menopause   . Elevated BP 04/21/2011  . Essential hypertension   . HIATAL HERNIA WITH REFLUX, HX OF 10/16/2010  . Hyperlipidemia 04/21/2011  . Inappropriate sinus tachycardia   . INSOMNIA 10/16/2010  . Knee pain, left 03/11/2012  .  Migraine 03/07/2013  . Obesity   . Obesity (BMI 30-39.9) 01/12/2018  . Palpitations 01/28/2015  . Reflux 04/22/2012  . Sleep apnea 06/07/2011  . Tobacco use disorder 02/10/2017  . Urinary urgency 08/17/2016  . UTI'S, HX OF 10/16/2010    Past Surgical History:  Procedure Laterality Date  . CHOLECYSTECTOMY      Family History  Problem Relation Age of Onset  . Lupus Mother   . Arthritis Maternal Grandmother   . Scleroderma Maternal Grandmother   . Glaucoma Maternal Grandmother   . Cataracts Maternal Grandmother   . Cancer Maternal Grandfather        prostate  . Coronary artery disease Maternal Grandfather        s/p angioplasty  . Diabetes Maternal Grandfather        Type 2  . Cancer Paternal Grandmother        Breast ca- dx in 79's  . Thyroid disease Paternal Grandmother   . Breast cancer Paternal Grandmother   . Diabetes Paternal Grandfather   . Thyroid disease Sister   . Asthma Sister   . Allergies Brother   . Cancer Father        cigarette, alcohol HEENT squamous cell  . Arthritis Other     Social History   Socioeconomic History  . Marital status: Married    Spouse name: Not on file  . Number of children: Not on file  . Years of education: Not on file  . Highest education level: Not  on file  Occupational History  . Not on file  Social Needs  . Financial resource strain: Not on file  . Food insecurity:    Worry: Not on file    Inability: Not on file  . Transportation needs:    Medical: Not on file    Non-medical: Not on file  Tobacco Use  . Smoking status: Current Some Day Smoker    Packs/day: 0.10    Types: Cigars  . Smokeless tobacco: Never Used  . Tobacco comment: 1 black and milds  Substance and Sexual Activity  . Alcohol use: Yes    Comment: twice/year  . Drug use: No  . Sexual activity: Not on file  Lifestyle  . Physical activity:    Days per week: Not on file    Minutes per session: Not on file  . Stress: Not on file  Relationships  . Social  connections:    Talks on phone: Not on file    Gets together: Not on file    Attends religious service: Not on file    Active member of club or organization: Not on file    Attends meetings of clubs or organizations: Not on file    Relationship status: Not on file  . Intimate partner violence:    Fear of current or ex partner: Not on file    Emotionally abused: Not on file    Physically abused: Not on file    Forced sexual activity: Not on file  Other Topics Concern  . Not on file  Social History Narrative   Married lives w/ husband, works at [employer]    Outpatient Medications Prior to Visit  Medication Sig Dispense Refill  . ACETAMINOPHEN-BUTALBITAL 50-325 MG TABS Take 1 tablet by mouth 2 (two) times daily as needed (HEADACHES). 120 each 0  . azelastine (ASTELIN) 0.1 % nasal spray Place 2 sprays into both nostrils 2 (two) times daily. Use in each nostril as directed 30 mL 12  . BAYER MICROLET LANCETS lancets Use as directed once daily to check blood sugar.   DX E11.9 100 each 1  . Blood Glucose Monitoring Suppl (CONTOUR BLOOD GLUCOSE SYSTEM) DEVI Use daily to check blood sugar. DX E11.9 1 Device 0  . busPIRone (BUSPAR) 10 MG tablet TAKE 1/2 TO 1 TABLET(5 TO 10 MG) BY MOUTH THREE TIMES DAILY 90 tablet 2  . conjugated estrogens (PREMARIN) vaginal cream Place 1 Applicatorful vaginally once a week. 42.5 g 121  . CONTOUR NEXT TEST test strip USE TO CHECK BLOOD SUGAR DAILY 100 each 2  . cyclobenzaprine (FLEXERIL) 10 MG tablet TAKE 1 TABLET(10 MG) BY MOUTH TWICE DAILY AS NEEDED FOR MUSCLE SPASMS 60 tablet 0  . famotidine (PEPCID) 20 MG tablet Take 1 tablet (20 mg total) by mouth daily. 90 tablet 1  . fluconazole (DIFLUCAN) 150 MG tablet Take 150 mg by mouth as needed.    Marland Kitchen FLUoxetine HCl 60 MG TABS Take 60 mg by mouth daily.    . fluticasone (FLONASE) 50 MCG/ACT nasal spray Place 2 sprays into both nostrils daily. 16 g 6  . gabapentin (NEURONTIN) 100 MG capsule TAKE 1 CAPSULE(100 MG)  BY MOUTH AT BEDTIME 30 capsule 0  . gabapentin (NEURONTIN) 300 MG capsule Take 300 mg by mouth 2 (two) times daily.    Marland Kitchen HYDROcodone-acetaminophen (NORCO/VICODIN) 5-325 MG tablet Take 1 tablet by mouth every 6 (six) hours as needed. Patient needs appointment for further refills. 100 tablet 0  . losartan (COZAAR) 50  MG tablet Take 1 tablet (50 mg total) by mouth daily. 90 tablet 2  . meloxicam (MOBIC) 15 MG tablet Take 1 tablet (15 mg total) by mouth daily as needed for pain. 90 tablet 1  . metFORMIN (GLUCOPHAGE) 500 MG tablet TAKE 2 TABLETS BY MOUTH TWICE DAILY 360 tablet 1  . montelukast (SINGULAIR) 10 MG tablet TAKE 1 TABLET(10 MG) BY MOUTH AT BEDTIME 90 tablet 1  . omeprazole (PRILOSEC) 20 MG capsule Take 1 capsule (20 mg total) by mouth 2 (two) times daily before a meal. 180 capsule 3  . promethazine (PHENERGAN) 25 MG tablet Take 1 tablet (25 mg total) by mouth every 8 (eight) hours as needed for nausea or vomiting. 20 tablet 0  . propranolol (INDERAL) 40 MG tablet Take 1 tablet (40 mg total) by mouth 2 (two) times daily. 60 tablet 11  . rosuvastatin (CRESTOR) 5 MG tablet Take 1 tablet (5 mg total) by mouth 3 (three) times a week. 45 tablet 3  . sodium chloride (OCEAN) 0.65 % SOLN nasal spray Place 1 spray into both nostrils daily.    . SUMAtriptan (IMITREX) 50 MG tablet Take 1 tablet (50 mg total) by mouth every 2 (two) hours as needed for migraine. May repeat in 2 hours if headache persists or recurs. 10 tablet 3  . zolpidem (AMBIEN) 10 MG tablet Take 1 tablet (10 mg total) by mouth at bedtime as needed for up to 30 days for sleep. 30 tablet 3   No facility-administered medications prior to visit.     No Known Allergies  Review of Systems  Constitutional: Positive for malaise/fatigue. Negative for fever.  HENT: Negative for congestion.   Eyes: Negative for blurred vision.  Respiratory: Negative for shortness of breath.   Cardiovascular: Negative for chest pain, palpitations and leg  swelling.  Gastrointestinal: Positive for abdominal pain, heartburn and nausea. Negative for blood in stool, diarrhea, melena and vomiting.  Genitourinary: Negative for dysuria and frequency.  Musculoskeletal: Negative for falls.  Skin: Negative for rash.  Neurological: Negative for dizziness, loss of consciousness and headaches.  Endo/Heme/Allergies: Negative for environmental allergies.  Psychiatric/Behavioral: Negative for depression. The patient is not nervous/anxious.        Objective:    Physical Exam Constitutional:      Appearance: Normal appearance. She is not ill-appearing.  HENT:     Head: Normocephalic and atraumatic.  Eyes:     General:        Right eye: No discharge.        Left eye: No discharge.  Pulmonary:     Effort: Pulmonary effort is normal.  Neurological:     Mental Status: She is alert and oriented to person, place, and time.  Psychiatric:        Mood and Affect: Mood normal.        Behavior: Behavior normal.     LMP 01/08/2013  Wt Readings from Last 3 Encounters:  12/09/18 195 lb (88.5 kg)  10/14/18 194 lb 3.2 oz (88.1 kg)  08/20/18 190 lb 6.4 oz (86.4 kg)    Diabetic Foot Exam - Simple   No data filed     Lab Results  Component Value Date   WBC 7.2 01/14/2018   HGB 13.2 01/14/2018   HCT 41.0 01/14/2018   PLT 200.0 01/14/2018   GLUCOSE 93 01/14/2018   CHOL 159 01/14/2018   TRIG 155.0 (H) 01/14/2018   HDL 67.60 01/14/2018   LDLCALC 61 01/14/2018   ALT 16 01/14/2018  AST 11 01/14/2018   NA 137 01/14/2018   K 3.9 01/14/2018   CL 102 01/14/2018   CREATININE 0.68 01/14/2018   BUN 11 01/14/2018   CO2 22 01/14/2018   TSH 1.29 01/14/2018   HGBA1C 6.9 (H) 10/14/2018   MICROALBUR <0.7 09/17/2015    Lab Results  Component Value Date   TSH 1.29 01/14/2018   Lab Results  Component Value Date   WBC 7.2 01/14/2018   HGB 13.2 01/14/2018   HCT 41.0 01/14/2018   MCV 88.8 01/14/2018   PLT 200.0 01/14/2018   Lab Results  Component  Value Date   NA 137 01/14/2018   K 3.9 01/14/2018   CO2 22 01/14/2018   GLUCOSE 93 01/14/2018   BUN 11 01/14/2018   CREATININE 0.68 01/14/2018   BILITOT 0.2 01/14/2018   ALKPHOS 143 (H) 01/14/2018   AST 11 01/14/2018   ALT 16 01/14/2018   PROT 7.4 01/14/2018   ALBUMIN 4.3 01/14/2018   CALCIUM 10.1 01/14/2018   GFR 121.09 01/14/2018   Lab Results  Component Value Date   CHOL 159 01/14/2018   Lab Results  Component Value Date   HDL 67.60 01/14/2018   Lab Results  Component Value Date   LDLCALC 61 01/14/2018   Lab Results  Component Value Date   TRIG 155.0 (H) 01/14/2018   Lab Results  Component Value Date   CHOLHDL 2 01/14/2018   Lab Results  Component Value Date   HGBA1C 6.9 (H) 10/14/2018       Assessment & Plan:   Problem List Items Addressed This Visit    Hiatal hernia with gastroesophageal reflux    Avoid offending foods, start probiotics. Do not eat large meals in late evening and consider raising head of bed.       Relevant Medications   hyoscyamine (LEVSIN SL) 0.125 MG SL tablet   Diabetes mellitus type 2 in obese (HCC)    hgba1c acceptable, minimize simple carbs. Increase exercise as tolerated.       HTN (hypertension)    no changes to meds. Encouraged heart healthy diet such as the DASH diet and exercise as tolerated.       Obesity (BMI 30-39.9)    Encouraged DASH diet, decrease po intake and increase exercise as tolerated. Needs 7-8 hours of sleep nightly. Avoid trans fats, eat small, frequent meals every 4-5 hours with lean proteins, complex carbs and healthy fats. Minimize simple carbs, weight watchers APP      Abdominal pain    Avoid offending foods, small and frequent meals. Given an rx for Hyoscyamine to try prn. If symptoms worsen or change so we can attempt further work up and possible referral.         I am having Nicole Ball start on hyoscyamine. I am also having her maintain her Bayer Microlet Lancets, Contour Next Test,  ACETAMINOPHEN-BUTALBITAL, CONTOUR BLOOD GLUCOSE SYSTEM, conjugated estrogens, meloxicam, SUMAtriptan, omeprazole, metFORMIN, losartan, busPIRone, famotidine, fluticasone, azelastine, montelukast, propranolol, FLUoxetine HCl, gabapentin, sodium chloride, fluconazole, rosuvastatin, zolpidem, gabapentin, cyclobenzaprine, promethazine, and HYDROcodone-acetaminophen.  Meds ordered this encounter  Medications  . hyoscyamine (LEVSIN SL) 0.125 MG SL tablet    Sig: Place 1 tablet (0.125 mg total) under the tongue every 4 (four) hours as needed.    Dispense:  30 tablet    Refill:  0    I discussed the assessment and treatment plan with the patient. The patient was provided an opportunity to ask questions and all were answered. The patient  agreed with the plan and demonstrated an understanding of the instructions.   The patient was advised to call back or seek an in-person evaluation if the symptoms worsen or if the condition fails to improve as anticipated.  I provided 25 minutes of non-face-to-face time during this encounter.   Penni Homans, MD

## 2019-01-09 NOTE — Assessment & Plan Note (Signed)
hgba1c acceptable, minimize simple carbs. Increase exercise as tolerated.  

## 2019-01-12 ENCOUNTER — Other Ambulatory Visit: Payer: Self-pay | Admitting: Family Medicine

## 2019-01-13 ENCOUNTER — Telehealth: Payer: Self-pay | Admitting: Licensed Clinical Social Worker

## 2019-01-13 ENCOUNTER — Other Ambulatory Visit: Payer: Self-pay | Admitting: Family Medicine

## 2019-01-13 NOTE — Telephone Encounter (Signed)
CSW contacted patient to follow up on weekly food delivery package. Patient informed of no face to face contact during delivery. CSW discussed transition option for food delivery as the Covid 19 Food relief program will be ending on January 17, 2019. Patient verbalizes understanding and grateful for the assistance.  CSW continues to follow for supportive needs. Jackie Madolyn Ackroyd, LCSW, CCSW-MCS [phone]  

## 2019-01-14 ENCOUNTER — Other Ambulatory Visit: Payer: Self-pay | Admitting: Family Medicine

## 2019-01-17 ENCOUNTER — Other Ambulatory Visit: Payer: Self-pay | Admitting: Family Medicine

## 2019-01-17 MED ORDER — FLUOXETINE HCL 60 MG PO TABS: 60.0000 mg | ORAL_TABLET | Freq: Every day | ORAL | 5 refills | Status: DC

## 2019-01-17 NOTE — Telephone Encounter (Signed)
Rx sent 

## 2019-01-17 NOTE — Telephone Encounter (Signed)
Copied from [place] [phone]. Topic: Quick Communication - Rx Refill/Question >> Jan 17, 2019  2:01 PM Colvin Caroli N wrote: Medication: FLUoxetine HCl 60 MG TABS   Patient is requesting refill of this medication.   Preferred Pharmacy (with phone number or street name):[id number] - [place], [place]

## 2019-01-24 ENCOUNTER — Other Ambulatory Visit: Payer: Self-pay | Admitting: Family Medicine

## 2019-01-24 DIAGNOSIS — M25562 Pain in left knee: Secondary | ICD-10-CM

## 2019-01-25 ENCOUNTER — Other Ambulatory Visit: Payer: Self-pay

## 2019-01-25 MED ORDER — HYDROCODONE-ACETAMINOPHEN 5-325 MG PO TABS: 1.0000 | ORAL_TABLET | Freq: Four times a day (QID) | ORAL | 0 refills | Status: DC | PRN

## 2019-01-25 MED ORDER — FLUOXETINE HCL 20 MG PO CAPS: 20.0000 mg | ORAL_CAPSULE | Freq: Three times a day (TID) | ORAL | 5 refills | Status: DC

## 2019-01-27 ENCOUNTER — Encounter: Payer: Self-pay | Admitting: Family Medicine

## 2019-01-31 NOTE — Telephone Encounter (Signed)
Pt called and still needs the referral for [facility].

## 2019-02-01 ENCOUNTER — Other Ambulatory Visit: Payer: Self-pay | Admitting: Family Medicine

## 2019-02-01 DIAGNOSIS — F419 Anxiety disorder, unspecified: Secondary | ICD-10-CM

## 2019-02-01 DIAGNOSIS — F32A Depression, unspecified: Secondary | ICD-10-CM

## 2019-02-01 DIAGNOSIS — F329 Major depressive disorder, single episode, unspecified: Secondary | ICD-10-CM

## 2019-02-08 ENCOUNTER — Other Ambulatory Visit: Payer: Self-pay | Admitting: Family Medicine

## 2019-02-10 ENCOUNTER — Other Ambulatory Visit: Payer: Self-pay | Admitting: Family Medicine

## 2019-02-23 ENCOUNTER — Other Ambulatory Visit: Payer: Self-pay | Admitting: Family Medicine

## 2019-02-23 DIAGNOSIS — M25562 Pain in left knee: Secondary | ICD-10-CM

## 2019-02-24 MED ORDER — PROMETHAZINE HCL 25 MG PO TABS: 25.0000 mg | ORAL_TABLET | Freq: Three times a day (TID) | ORAL | 0 refills | Status: DC | PRN

## 2019-02-24 MED ORDER — HYDROCODONE-ACETAMINOPHEN 5-325 MG PO TABS: 1.0000 | ORAL_TABLET | Freq: Four times a day (QID) | ORAL | 0 refills | Status: DC | PRN

## 2019-02-25 ENCOUNTER — Other Ambulatory Visit: Payer: Self-pay

## 2019-02-25 ENCOUNTER — Encounter (HOSPITAL_COMMUNITY): Payer: Self-pay | Admitting: Psychiatry

## 2019-02-25 ENCOUNTER — Ambulatory Visit (INDEPENDENT_AMBULATORY_CARE_PROVIDER_SITE_OTHER): Payer: 59 | Admitting: Psychiatry

## 2019-02-25 DIAGNOSIS — F410 Panic disorder [episodic paroxysmal anxiety] without agoraphobia: Secondary | ICD-10-CM | POA: Diagnosis not present

## 2019-02-25 DIAGNOSIS — F909 Attention-deficit hyperactivity disorder, unspecified type: Secondary | ICD-10-CM | POA: Diagnosis not present

## 2019-02-25 DIAGNOSIS — F411 Generalized anxiety disorder: Secondary | ICD-10-CM

## 2019-02-25 DIAGNOSIS — F330 Major depressive disorder, recurrent, mild: Secondary | ICD-10-CM | POA: Insufficient documentation

## 2019-02-25 MED ORDER — AMPHETAMINE-DEXTROAMPHET ER 20 MG PO CP24: 20.0000 mg | ORAL_CAPSULE | Freq: Every day | ORAL | 0 refills | Status: DC

## 2019-02-25 MED ORDER — FLUOXETINE HCL 20 MG PO CAPS: 20.0000 mg | ORAL_CAPSULE | Freq: Three times a day (TID) | ORAL | 5 refills | Status: DC

## 2019-02-25 MED ORDER — ZOLPIDEM TARTRATE 10 MG PO TABS: 10.0000 mg | ORAL_TABLET | Freq: Every evening | ORAL | 3 refills | Status: DC | PRN

## 2019-02-25 MED ORDER — ALPRAZOLAM 0.5 MG PO TABS: 0.5000 mg | ORAL_TABLET | Freq: Every day | ORAL | 2 refills | Status: DC | PRN

## 2019-02-25 MED ORDER — BUSPIRONE HCL 15 MG PO TABS: 15.0000 mg | ORAL_TABLET | Freq: Two times a day (BID) | ORAL | 0 refills | Status: DC

## 2019-02-25 NOTE — Progress Notes (Signed)
Psychiatric Initial Adult Assessment   Patient Identification: Nicole Ball MRN:  [id number] Date of Evaluation:  02/25/2019 Referral Source: Reuel DerbyStacy Blyth MD Chief Complaint:  Anxiety, problems with concentration, task completion. Visit Diagnosis:    ICD-10-CM   1. GAD (generalized anxiety disorder)  F41.1   2. Panic disorder without agoraphobia  F41.0   3. Major depressive disorder, recurrent, mild (HCC)  F33.0   4. Adult ADHD  F90.9   Interview was conducted using WebEx teleconferencing application and I verified that I was speaking with the correct person using two identifiers. I discussed the limitations of evaluation and management by telemedicine and  the availability of in person appointments. Patient expressed understanding and agreed to proceed.  History of Present Illness:  Patient is a 43 yo married AAF referred by her PCP for treatment oif depression and anxiety. Patient reports long standing depression (on and off) and generalized anxiety. She used to have frequent panic attacks and was prescribed alprazolam 0.5 mg tid prn these. They are much less frequent now but they still occur occasionally. She ws taken off alprazolam when a decision to use hydrocodone for migraine headaches was made. She is now on buspirone and fluoxetine combination for depression/anxiety. In the oast she tried citalopram and venlafaxine but could not tolerate excessive sweating and other adverse effects which now, in perspective, she thinks might have been more related to her menopausal symptoms. She admits that in the past she had passive suicidal thoughts when depression was "really bed". She never admitted to those however and was never treated as inpatient on a psychiatric ward. She has no hx of mania or psychosis. She denies having any personal hx of alcohol or drug abuse. There is a hx of alcohol abuse on both sides of the family.   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 She has never been formally diagnosed with ADD but at one time took a colleague's Adderall and noticed major improvement in concentration. She has OSA and uses CPAP - this may be contributing to her daytime fatigue.  Associated Signs/Symptoms: Depression Symptoms:  depressed mood, fatigue, difficulty concentrating, anxiety, panic attacks, disturbed sleep, (Hypo) Manic Symptoms:  None Anxiety Symptoms:  Excessive Worry, Panic Symptoms, Psychotic Symptoms:  None PTSD Symptoms: Negative  Past Psychiatric History: see above.  Previous Psychotropic Medications: Yes   Substance Abuse History in the last 12 months:  No.  Consequences of Substance Abuse: NA  Past Medical History:  Past Medical History:  Diagnosis Date  . Abnormal serum level of alkaline phosphatase 05/21/2014  . Acute low back pain 12/19/2010  . ALLERGIC RHINITIS 10/16/2010  . Allergy    seasonal  . Anxiety and depression 06/07/2011  . Atrophic vaginitis 08/17/2016  . Cervical cancer screening 01/10/2014   Menarche at 8 Regular and moderate flow, became irregular until OCPs  history of abnormal pap in past, repeat was normal Last pap roughly 3 years ago G0P0, s/p No history of abnormal MGM, no h/o MGM  No concerns today no gyn surgeries LMP May 2013  . CTS (carpal tunnel syndrome) 01/15/2014  . Diabetes mellitus type 2 in obese (HCC) 10/30/2011  . Dry eyes 08/17/2016  . Early menopause   . Elevated BP 04/21/2011  . Essential hypertension   . HIATAL HERNIA WITH REFLUX, HX OF 10/16/2010  . Hyperlipidemia 04/21/2011  .  Inappropriate sinus tachycardia   . INSOMNIA 10/16/2010  . Knee pain, left 03/11/2012  . Migraine 03/07/2013  . Obesity   . Obesity (BMI 30-39.9) 01/12/2018  . Palpitations 01/28/2015  . Reflux 04/22/2012  . Sleep apnea 06/07/2011  . Tobacco use disorder  02/10/2017  . Urinary urgency 08/17/2016  . UTI'S, HX OF 10/16/2010    Past Surgical History:  Procedure Laterality Date  . CHOLECYSTECTOMY      Family Psychiatric History: Reviewed.  Family History:  Family History  Problem Relation Age of Onset  . Lupus Mother   . Arthritis Maternal Grandmother   . Scleroderma Maternal Grandmother   . Glaucoma Maternal Grandmother   . Cataracts Maternal Grandmother   . Cancer Maternal Grandfather        prostate  . Coronary artery disease Maternal Grandfather        s/p angioplasty  . Diabetes Maternal Grandfather        Type 2  . Cancer Paternal Grandmother        Breast ca- dx in 37's  . Thyroid disease Paternal Grandmother   . Breast cancer Paternal Grandmother   . Diabetes Paternal Grandfather   . Thyroid disease Sister   . Asthma Sister   . Allergies Brother   . Cancer Father        cigarette, alcohol HEENT squamous cell  . Alcohol abuse Father   . Arthritis Other   . Alcohol abuse Maternal Aunt   . Alcohol abuse Maternal Uncle     Social History:   Social History   Socioeconomic History  . Marital status: Married    Spouse name: Not on file  . Number of children: Not on file  . Years of education: Not on file  . Highest education level: Not on file  Occupational History  . Not on file  Social Needs  . Financial resource strain: Not on file  . Food insecurity    Worry: Not on file    Inability: Not on file  . Transportation needs    Medical: Not on file    Non-medical: Not on file  Tobacco Use  . Smoking status: Current Some Day Smoker    Packs/day: 0.10    Types: Cigars  . Smokeless tobacco: Never Used  . Tobacco comment: 1 black and milds  Substance and Sexual Activity  . Alcohol use: Yes    Comment: twice/year  . Drug use: No  . Sexual activity: Not on file  Lifestyle  . Physical activity    Days per week: Not on file    Minutes per session: Not on file  . Stress: Not on file  Relationships  . Social  Herbalist on phone: Not on file    Gets together: Not on file    Attends religious service: Not on file    Active member of club or organization: Not on file    Attends meetings of clubs or organizations: Not on file    Relationship status: Not on file  Other Topics Concern  . Not on file  Social History Narrative   Married lives w/ husband, works at [employer]    Allergies:  No Known Allergies  Metabolic Disorder Labs: Lab Results  Component Value Date   HGBA1C 6.9 (H) 10/14/2018   MPG 140 (H) 05/19/2014   MPG 148 (H) 10/14/2013   No results found for: PROLACTIN Lab Results  Component Value Date   CHOL 159 01/14/2018  TRIG 155.0 (H) 01/14/2018   HDL 67.60 01/14/2018   CHOLHDL 2 01/14/2018   VLDL 31.0 01/14/2018   LDLCALC 61 01/14/2018   LDLCALC 43 10/13/2017   Lab Results  Component Value Date   TSH 1.29 01/14/2018    Therapeutic Level Labs: No results found for: LITHIUM No results found for: CBMZ No results found for: VALPROATE  Current Medications: Current Outpatient Medications  Medication Sig Dispense Refill  . ACETAMINOPHEN-BUTALBITAL 50-325 MG TABS Take 1 tablet by mouth 2 (two) times daily as needed (HEADACHES). 120 each 0  . azelastine (ASTELIN) 0.1 % nasal spray Place 2 sprays into both nostrils 2 (two) times daily. Use in each nostril as directed 30 mL 12  . BAYER MICROLET LANCETS lancets Use as directed once daily to check blood sugar.   DX E11.9 100 each 1  . Blood Glucose Monitoring Suppl (CONTOUR BLOOD GLUCOSE SYSTEM) DEVI Use daily to check blood sugar. DX E11.9 1 Device 0  . busPIRone (BUSPAR) 10 MG tablet TAKE 1/2 TO 1 TABLET(5 TO 10 MG) BY MOUTH THREE TIMES DAILY 90 tablet 2  . conjugated estrogens (PREMARIN) vaginal cream Place 1 Applicatorful vaginally once a week. 42.5 g 121  . CONTOUR NEXT TEST test strip USE TO CHECK BLOOD SUGAR DAILY 100 each 2  . cyclobenzaprine (FLEXERIL) 10 MG tablet TAKE 1 TABLET(10 MG) BY MOUTH TWICE  DAILY AS NEEDED FOR MUSCLE SPASMS 60 tablet 0  . famotidine (PEPCID) 20 MG tablet Take 1 tablet (20 mg total) by mouth daily. 90 tablet 1  . fluconazole (DIFLUCAN) 150 MG tablet Take 150 mg by mouth as needed.    Marland Kitchen. FLUoxetine (PROZAC) 20 MG capsule Take 1 capsule (20 mg total) by mouth 3 (three) times daily. 90 capsule 5  . fluticasone (FLONASE) 50 MCG/ACT nasal spray Place 2 sprays into both nostrils daily. 16 g 6  . gabapentin (NEURONTIN) 100 MG capsule TAKE 1 CAPSULE(100 MG) BY MOUTH AT BEDTIME 30 capsule 0  . gabapentin (NEURONTIN) 300 MG capsule Take 300 mg by mouth 2 (two) times daily.    Marland Kitchen. HYDROcodone-acetaminophen (NORCO/VICODIN) 5-325 MG tablet Take 1 tablet by mouth every 6 (six) hours as needed. Patient needs appointment for further refills. 100 tablet 0  . hyoscyamine (LEVSIN SL) 0.125 MG SL tablet DISSOLVE 1 TABLET(0.125 MG) UNDER THE TONGUE EVERY 4 HOURS AS NEEDED 30 tablet 0  . losartan (COZAAR) 50 MG tablet Take 1 tablet (50 mg total) by mouth daily. 90 tablet 2  . meloxicam (MOBIC) 15 MG tablet Take 1 tablet (15 mg total) by mouth daily as needed for pain. 90 tablet 1  . metFORMIN (GLUCOPHAGE) 500 MG tablet TAKE 2 TABLETS BY MOUTH TWICE DAILY 360 tablet 1  . montelukast (SINGULAIR) 10 MG tablet TAKE 1 TABLET(10 MG) BY MOUTH AT BEDTIME 90 tablet 1  . omeprazole (PRILOSEC) 20 MG capsule Take 1 capsule (20 mg total) by mouth 2 (two) times daily before a meal. 180 capsule 3  . promethazine (PHENERGAN) 25 MG tablet Take 1 tablet (25 mg total) by mouth every 8 (eight) hours as needed for nausea or vomiting. 20 tablet 0  . propranolol (INDERAL) 40 MG tablet Take 1 tablet (40 mg total) by mouth 2 (two) times daily. 60 tablet 11  . rosuvastatin (CRESTOR) 5 MG tablet Take 1 tablet (5 mg total) by mouth 3 (three) times a week. 45 tablet 3  . sodium chloride (OCEAN) 0.65 % SOLN nasal spray Place 1 spray into both nostrils daily.    .Marland Kitchen  SUMAtriptan (IMITREX) 50 MG tablet Take 1 tablet (50 mg  total) by mouth every 2 (two) hours as needed for migraine. May repeat in 2 hours if headache persists or recurs. 10 tablet 3  . zolpidem (AMBIEN) 10 MG tablet Take 1 tablet (10 mg total) by mouth at bedtime as needed for up to 30 days for sleep. 30 tablet 3   No current facility-administered medications for this visit.      Psychiatric Specialty Exam: Review of Systems  Constitutional: Positive for malaise/fatigue.  Gastrointestinal: Positive for heartburn.  Musculoskeletal: Positive for back pain.  Neurological: Positive for headaches.  Psychiatric/Behavioral: Positive for depression. The patient is nervous/anxious and has insomnia.     Last menstrual period 01/08/2013.There is no height or weight on file to calculate BMI.  General Appearance: Casual and Well Groomed  Eye Contact:  Good  Speech:  Clear and Coherent and Normal Rate  Volume:  Normal  Mood:  Anxious and Depressed  Affect:  Full Range  Thought Process:  Goal Directed  Orientation:  Full (Time, Place, and Person)  Thought Content:  Logical  Suicidal Thoughts:  No  Homicidal Thoughts:  No  Memory:  Immediate;   Fair Recent;   Good Remote;   Good  Judgement:  Good  Insight:  Good  Psychomotor Activity:  Normal  Concentration:  Concentration: Fair  Recall:  Fair  Fund of Knowledge:Good  Language: Good  Akathisia:  Negative  Handed:  Right  AIMS (if indicated):  not done  Assets:  Communication Skills Desire for Improvement Financial Resources/Insurance Housing Resilience Social Support Vocational/Educational  ADL's:  Intact  Cognition: WNL  Sleep:  Fair   Assessment and Plan:  Patient is a 43 yo married AAF referred by her PCP for treatment oif depression and anxiety. Patient reports long standing depression (on and off) and generalized anxiety. She used to have frequent panic attacks and was prescribed alprazolam 0.5 mg tid prn these. They are much less frequent now but they still occur occasionally. She  ws taken off alprazolam when a decision to use hydrocodone for migraine headaches was made. She is now on buspirone and fluoxetine combination for depression/anxiety. In the oast she tried citalopram and venlafaxine but could not tolerate excessive sweating and other adverse effects which now, in perspective, she thinks might have been more related to her menopausal symptoms. She admits that in the past she had passive suicidal thoughts when depression was "really bed". She never admitted to those however and was never treated as inpatient on a psychiatric ward. She has no hx of mania or psychosis. She denies having any personal hx of alcohol or drug abuse. There is a hx of alcohol abuse on both sides of the family.  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 She has never been formally diagnosed with ADD but at one time took a colleague's Adderall and noticed major improvement in concentration.   Dx: GAD; Panic disorder; ADD adult type; MDD recurrent, mild  Plan: Continue Prozac 60 mg daily, Buspar change to 15 mg bid, zolpidem 10 mg prn sleep. Restart alprazolam 0.5 mg daily prn panic attacks and add Adderall xr 20 mgh daily for add. We will decide in a month if this dose of Adderall is adequate.  The plan was discussed with patient who had an opportunity to ask questions  and these were all answered. I spend 60 minutes in videoconferencing with the patient and devoted approximately 50% of this time to explanation of diagnosis, discussion of treatment options and med education.  Magdalene Patricia, MD 7/17/20209:37 AM

## 2019-03-01 NOTE — Progress Notes (Signed)
Virtual Visit via Video Note   This visit type was conducted due to national recommendations for restrictions regarding the COVID-19 Pandemic (e.g. social distancing) in an effort to limit this patient's exposure and mitigate transmission in our community.  Due to her co-morbid illnesses, this patient is at least at moderate risk for complications without adequate follow up.  This format is felt to be most appropriate for this patient at this time.  All issues noted in this document were discussed and addressed.  A limited physical exam was performed with this format.  Please refer to the patient's chart for her consent to telehealth for [facility].   Evaluation Performed:  Follow-up visit, 3 months  Date:  03/04/2019   ID:  Nicole Linesishia Z [place], DOB 1976/02/04, MRN [id number]  Patient Location: Home Provider Location: Office  PCP:  Bradd CanaryBlyth, Stacey A, MD  Cardiologist:  Tobias AlexanderKatarina Avrohom Mckelvin, MD  Electrophysiologist:  None   Chief Complaint: Chest pain, fever, myalgias, headache.  History of Present Illness:    Nicole Ball is a 43 y.o. female with hx of h/o DM 2 (for the last 2-3 years with PCOS and early menopause), HTN, anxiety who has noticed palpitations and tachycardia in March 2016, she was started on metoprolol and her symptoms improved however she continues to be tachycardic. They are very uncomfortable, but no dizziness or syncope.  She was recently diagnosed with fatty liver and gallbladder stone and she is supposed to see a surgeon the next week.  She is undergoing significant stress with her husband who is also our patient requires myectomy for HCM. H/O MI in grandparents.  08/20/2018 -the patient is coming after 3 years, in a interim she was seen by Dr. Mayford Knifeurner for sleep apnea and is using CPAP machine that is helping significantly.  She was also seen by Dr. Graciela HusbandsKlein who diagnosed her with inappropriate sinus tachycardia secondary to stress and increase the dose of her propranolol.  She states that lately she has occasional palpitations few times a week they are not associated with other symptoms such as presyncope or syncope.  She stopped taking atorvastatin as she developed significant myalgias.  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  She feels that 1 of her medicines is changing texture of her hair.  12/09/2018 -the patient reports that in the last 7 days she has been having fever chills headaches profound myalgias associated with chest pain radiating to her back and shortness of breath, however since last night she is afebrile and feels slightly better.  Since the last we did an adjustment of her medications she has been feeling otherwise significantly better with resolution of palpitations.  No chest pain prior to these febrile episodes.  Her husband also went to the ER last night with symptoms suspicious for COVID-19 infection.  He was not tested.  03/04/2019 -3 months follow-up, the patient feels significantly better, she and her husband were diagnosed with call with but they have both now recovered.  She continues to have problems with memory, remembering words and is questioning if COVID could have caused strokelike problems she has no other weakness problems with balance.  Her blood pressure has been better controlled but is borderline.  Her palpitations have resolved.  She has no lower extremity edema.  The patient does have symptoms concerning for COVID-19 infection (fever, chills, cough, or new shortness of breath).  Past Medical History:  Diagnosis Date  . Abnormal serum level of alkaline phosphatase 05/21/2014  . Acute low back pain 12/19/2010  . ALLERGIC RHINITIS 10/16/2010  . Allergy    seasonal  . Anxiety and depression 06/07/2011  . Atrophic vaginitis 08/17/2016  . Cervical cancer screening 01/10/2014   Menarche at 8 Regular and moderate flow, became irregular until  OCPs  history of abnormal pap in past, repeat was normal Last pap roughly 3 years ago G0P0, s/p No history of abnormal MGM, no h/o MGM  No concerns today no gyn surgeries LMP May 2013  . CTS (carpal tunnel syndrome) 01/15/2014  . Diabetes mellitus type 2 in obese ([place]) 10/30/2011  . Dry eyes 08/17/2016  . Early menopause   . Elevated BP 04/21/2011  . Essential hypertension   . HIATAL HERNIA WITH REFLUX, HX OF 10/16/2010  . Hyperlipidemia 04/21/2011  . Inappropriate sinus tachycardia   . INSOMNIA 10/16/2010  . Knee pain, left 03/11/2012  . Migraine 03/07/2013  . Obesity   . Obesity (BMI 30-39.9) 01/12/2018  . Palpitations 01/28/2015  . Reflux 04/22/2012  . Sleep apnea 06/07/2011  . Tobacco use disorder 02/10/2017  . Urinary urgency 08/17/2016  . UTI'S, HX OF 10/16/2010   Past Surgical History:  Procedure Laterality Date  . CHOLECYSTECTOMY       Current Meds  Medication Sig  . ACETAMINOPHEN-BUTALBITAL 50-325 MG TABS Take 1 tablet by mouth 2 (two) times daily as needed (HEADACHES).  . ALPRAZolam (XANAX) 0.5 MG tablet Take 1 tablet (0.5 mg total) by mouth daily as needed for anxiety.  Marland Kitchen amphetamine-dextroamphetamine (ADDERALL XR) 20 MG 24 hr capsule Take 1 capsule (20 mg total) by mouth daily.  Marland Kitchen azelastine (ASTELIN) 0.1 % nasal spray Place 2 sprays into both nostrils 2 (two) times daily. Use in each nostril as directed  . BAYER MICROLET LANCETS lancets Use as directed once daily to check blood sugar.   DX E11.9  . Blood Glucose Monitoring Suppl (CONTOUR BLOOD GLUCOSE SYSTEM) DEVI Use daily to check blood sugar. DX E11.9  . busPIRone (BUSPAR) 15 MG tablet Take 1 tablet (15 mg total) by mouth 2 (two) times daily.  Marland Kitchen conjugated estrogens (PREMARIN) vaginal cream Place 1 Applicatorful vaginally once a week.  . CONTOUR NEXT TEST test strip USE TO CHECK BLOOD SUGAR DAILY  . cyclobenzaprine (FLEXERIL) 10 MG tablet TAKE 1 TABLET(10 MG) BY MOUTH TWICE DAILY AS NEEDED FOR MUSCLE SPASMS  . famotidine (PEPCID) 20 MG  tablet Take 1 tablet (20 mg total) by mouth daily.  . fluconazole (DIFLUCAN) 150 MG tablet Take 150 mg by mouth as needed.  Marland Kitchen FLUoxetine (PROZAC) 20 MG capsule Take 1 capsule (20 mg total) by mouth 3 (three) times daily.  . fluticasone (FLONASE) 50 MCG/ACT nasal spray Place 2 sprays into both nostrils daily.  Marland Kitchen gabapentin (NEURONTIN) 100 MG capsule TAKE 1 CAPSULE(100 MG) BY MOUTH AT BEDTIME  . gabapentin (NEURONTIN) 300 MG capsule Take 300 mg by mouth 2 (two) times daily.  Marland Kitchen HYDROcodone-acetaminophen (NORCO/VICODIN) 5-325 MG tablet Take 1 tablet by mouth every 6 (six) hours as needed. Patient needs appointment for further refills.  . hyoscyamine (LEVSIN SL) 0.125 MG SL tablet DISSOLVE 1 TABLET(0.125 MG) UNDER THE TONGUE EVERY 4 HOURS AS NEEDED  . losartan (COZAAR) 50 MG tablet Take 1 tablet (50 mg total) by mouth daily.  . meloxicam (MOBIC) 15 MG tablet Take 1 tablet (15 mg total) by mouth daily as needed for pain.  . metFORMIN (GLUCOPHAGE)  500 MG tablet TAKE 2 TABLETS BY MOUTH TWICE DAILY  . montelukast (SINGULAIR) 10 MG tablet TAKE 1 TABLET(10 MG) BY MOUTH AT BEDTIME  . omeprazole (PRILOSEC) 20 MG capsule Take 1 capsule (20 mg total) by mouth 2 (two) times daily before a meal.  . promethazine (PHENERGAN) 25 MG tablet Take 1 tablet (25 mg total) by mouth every 8 (eight) hours as needed for nausea or vomiting.  . propranolol (INDERAL) 40 MG tablet Take 1 tablet (40 mg total) by mouth 2 (two) times daily.  . rosuvastatin (CRESTOR) 5 MG tablet Take 1 tablet (5 mg total) by mouth 3 (three) times a week.  . sodium chloride (OCEAN) 0.65 % SOLN nasal spray Place 1 spray into both nostrils daily.  . SUMAtriptan (IMITREX) 50 MG tablet Take 1 tablet (50 mg total) by mouth every 2 (two) hours as needed for migraine. May repeat in 2 hours if headache persists or recurs.  Marland Kitchen. zolpidem (AMBIEN) 10 MG tablet Take 1 tablet (10 mg total) by mouth at bedtime as needed for sleep.     Allergies:   Patient has no  known allergies.   Social History   Tobacco Use  . Smoking status: Current Some Day Smoker    Packs/day: 0.10    Types: Cigars  . Smokeless tobacco: Never Used  . Tobacco comment: 1 black and milds  Substance Use Topics  . Alcohol use: Yes    Comment: twice/year  . Drug use: No     Family Hx: The patient's family history includes Alcohol abuse in her father, maternal aunt, and maternal uncle; Allergies in her brother; Arthritis in her maternal grandmother and another family member; Asthma in her sister; Breast cancer in her paternal grandmother; Cancer in her father, maternal grandfather, and paternal grandmother; Cataracts in her maternal grandmother; Coronary artery disease in her maternal grandfather; Diabetes in her maternal grandfather and paternal grandfather; Glaucoma in her maternal grandmother; Lupus in her mother; Scleroderma in her maternal grandmother; Thyroid disease in her paternal grandmother and sister.  ROS:   Please see the history of present illness.    All other systems reviewed and are negative.   Prior CV studies:   The following studies were reviewed today:  Labs/Other Tests and Data Reviewed:    EKG:  No ECG reviewed.  Recent Labs: No results found for requested labs within last 8760 hours.   Recent Lipid Panel Lab Results  Component Value Date/Time   CHOL 159 01/14/2018 12:21 PM   TRIG 155.0 (H) 01/14/2018 12:21 PM   HDL 67.60 01/14/2018 12:21 PM   CHOLHDL 2 01/14/2018 12:21 PM   LDLCALC 61 01/14/2018 12:21 PM    Wt Readings from Last 3 Encounters:  03/04/19 204 lb (92.5 kg)  12/09/18 195 lb (88.5 kg)  10/14/18 194 lb 3.2 oz (88.1 kg)     Objective:    Vital Signs:  BP 133/90   Pulse 92   Wt 204 lb (92.5 kg)   LMP 01/08/2013   BMI 36.14 kg/m    VITAL SIGNS:  reviewed  ASSESSMENT & PLAN:    1. Inappropriate sinus tachycardia  -palpitations have resolved, her heart rate remains in 90s, she is being started on Adderall, she will  provide us a diary of her heart rates and we will see if we need to adjust propranolol.   2. Hypertension  -we increased losartan to 50 mg daily at the last visit, blood pressure still 1 30-1 40 range, she will send  this diary measured at rest and we will see if we need to adjust.   3. hyperlipidemia  - started rosuvastatin 5 mg 3 times a week, she is tolerating it well.  COVID-19 Education: The signs and symptoms of COVID-19 were discussed with the patient and how to seek care for testing (follow up with PCP or arrange E-visit).  The importance of social distancing was discussed today.  Time:   Today, I have spent 25 minutes with the patient with telehealth technology discussing the above problems.     Medication Adjustments/Labs and Tests Ordered: Current medicines are reviewed at length with the patient today.  Concerns regarding medicines are outlined above.   Tests Ordered: No orders of the defined types were placed in this encounter.   Medication Changes: No orders of the defined types were placed in this encounter.   Disposition:  Follow up in 6 month(s)  Signed, Tobias AlexanderKatarina Aftyn Nott, MD  03/04/2019 9:50 AM     [facility]

## 2019-03-04 ENCOUNTER — Telehealth (INDEPENDENT_AMBULATORY_CARE_PROVIDER_SITE_OTHER): Payer: 59 | Admitting: Cardiology

## 2019-03-04 ENCOUNTER — Encounter: Payer: Self-pay | Admitting: *Deleted

## 2019-03-04 ENCOUNTER — Other Ambulatory Visit: Payer: Self-pay

## 2019-03-04 ENCOUNTER — Encounter: Payer: Self-pay | Admitting: Cardiology

## 2019-03-04 DIAGNOSIS — R000 Tachycardia, unspecified: Secondary | ICD-10-CM

## 2019-03-04 DIAGNOSIS — I10 Essential (primary) hypertension: Secondary | ICD-10-CM

## 2019-03-04 DIAGNOSIS — E782 Mixed hyperlipidemia: Secondary | ICD-10-CM

## 2019-03-04 NOTE — Patient Instructions (Signed)
Medication Instructions:   Your physician recommends that you continue on your current medications as directed. Please refer to the Current Medication list given to you today.  If you need a refill on your cardiac medications before your next appointment, please call your pharmacy.     Follow-Up: At [facility], you and your health needs are our priority.  As part of our continuing mission to provide you with exceptional heart care, we have created designated Provider Care Teams.  These Care Teams include your primary Cardiologist (physician) and Advanced Practice Providers (APPs -  Physician Assistants and Nurse Practitioners) who all work together to provide you with the care you need, when you need it.  Your physician wants you to follow-up in: [place] will receive a reminder letter in the mail two months in advance. If you don't receive a letter, please call our office to schedule the follow-up appointment.  PLEASE CALL THE OFFICE IN 4 MONTHS TO HAVE THIS APPOINTMENT ARRANGED, FOR HER SCHEDULE WILL BE AVAILABLE THEN.  THIS WILL BE A REGULAR OFFICE VISIT.  *PLEASE SEND [place] A DIARY OF YOUR BLOOD PRESSURES AS INSTRUCTED BY DR Meda Coffee, THROUGH YOUR MYCHART ACCOUNT*

## 2019-03-24 ENCOUNTER — Other Ambulatory Visit: Payer: Self-pay | Admitting: *Deleted

## 2019-03-24 MED ORDER — SUMATRIPTAN SUCCINATE 50 MG PO TABS: 50.0000 mg | ORAL_TABLET | ORAL | 3 refills | Status: DC | PRN

## 2019-03-25 ENCOUNTER — Ambulatory Visit (INDEPENDENT_AMBULATORY_CARE_PROVIDER_SITE_OTHER): Payer: 59 | Admitting: Psychiatry

## 2019-03-25 ENCOUNTER — Other Ambulatory Visit: Payer: Self-pay

## 2019-03-25 ENCOUNTER — Ambulatory Visit (HOSPITAL_COMMUNITY): Payer: Self-pay | Admitting: Psychiatry

## 2019-03-25 DIAGNOSIS — F909 Attention-deficit hyperactivity disorder, unspecified type: Secondary | ICD-10-CM | POA: Diagnosis not present

## 2019-03-25 DIAGNOSIS — F330 Major depressive disorder, recurrent, mild: Secondary | ICD-10-CM | POA: Diagnosis not present

## 2019-03-25 DIAGNOSIS — F411 Generalized anxiety disorder: Secondary | ICD-10-CM | POA: Diagnosis not present

## 2019-03-25 MED ORDER — ALPRAZOLAM 0.5 MG PO TABS: 0.5000 mg | ORAL_TABLET | Freq: Two times a day (BID) | ORAL | 2 refills | Status: DC | PRN

## 2019-03-25 MED ORDER — AMPHETAMINE-DEXTROAMPHET ER 25 MG PO CP24: 25.0000 mg | ORAL_CAPSULE | Freq: Every day | ORAL | 0 refills | Status: DC

## 2019-03-25 NOTE — Progress Notes (Signed)
BH MD/PA/NP OP Progress Note  03/25/2019 9:42 AM Nicole Ball  MRN:  [id number] Interview was conducted by phone and I verified that I was speaking with the correct person using two identifiers. I discussed the limitations of evaluation and management by telemedicine and  the availability of in person appointments. Patient expressed understanding and agreed to proceed.  Chief Complaint: Anxiety.  HPI:  Patient is a 43 yo married AAF with a long standing hx of depression (on and off) and generalized anxiety. She used to have frequent panic attacks and was prescribed alprazolam 0.5 mg tid prn these. They are much less frequent now but they still occur occasionally. She was taken off alprazolam when a decision to use hydrocodone for migraine headaches was made. She is now on buspirone and fluoxetine combination for depression/anxiety. In the past she tried citalopram and venlafaxine but could not tolerate excessive sweating and other adverse effects which now, in perspective, she thinks might have been more related to her menopausal symptoms. She admits that in the past she had passive suicidal thoughts when depression was "really bed". She never admitted to those however and was never treated as inpatient on a psychiatric ward. She has no hx of mania or psychosis. She denies having any personal hx of alcohol or drug abuse. There is a hx of alcohol abuse on both sides of the family.   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 She has never been formally diagnosed with ADD but at one time took a colleague's Adderall and noticed major improvement in concentration. We added Adderall XR 20 mg and she noticed improvement but still has some problems with focusing. Alprazolam was restarted at 0.5 mg but she will at times need toi take 2  tablets (ie 1 mg dose).   Visit Diagnosis:    ICD-10-CM   1. Major depressive disorder, recurrent, mild (HCC)  F33.0   2. Adult ADHD  F90.9   3. GAD (generalized anxiety disorder)  F41.1     Past Psychiatric History: Please see intake H&P.  Past Medical History:  Past Medical History:  Diagnosis Date  . Abnormal serum level of alkaline phosphatase 05/21/2014  . Acute low back pain 12/19/2010  . ALLERGIC RHINITIS 10/16/2010  . Allergy    seasonal  . Anxiety and depression 06/07/2011  . Atrophic vaginitis 08/17/2016  . Cervical cancer screening 01/10/2014   Menarche at 8 Regular and moderate flow, became irregular until OCPs  history of abnormal pap in past, repeat was normal Last pap roughly 3 years ago G0P0, s/p No history of abnormal MGM, no h/o MGM  No concerns today no gyn surgeries LMP May 2013  . CTS (carpal tunnel syndrome) 01/15/2014  . Diabetes mellitus type 2 in obese ([place]) 10/30/2011  . Dry eyes 08/17/2016  . Early menopause   . Elevated BP 04/21/2011  . Essential hypertension   . HIATAL HERNIA WITH REFLUX, HX OF 10/16/2010  . Hyperlipidemia 04/21/2011  . Inappropriate sinus tachycardia   . INSOMNIA 10/16/2010  . Knee pain, left 03/11/2012  . Migraine 03/07/2013  . Obesity   . Obesity (BMI 30-39.9) 01/12/2018  . Palpitations 01/28/2015  . Reflux 04/22/2012  . Sleep apnea 06/07/2011  . Tobacco use disorder 02/10/2017  . Urinary urgency 08/17/2016  . UTI'S, HX OF 10/16/2010    Past Surgical History:  Procedure Laterality Date  . CHOLECYSTECTOMY      Family Psychiatric History: Reviewed.  Family History:  Family History  Problem Relation Age of Onset  . Lupus Mother   . Arthritis Maternal Grandmother   . Scleroderma Maternal Grandmother   . Glaucoma Maternal Grandmother   . Cataracts Maternal Grandmother   . Cancer Maternal Grandfather        prostate  . Coronary artery disease Maternal Grandfather        s/p angioplasty  . Diabetes Maternal Grandfather        Type 2  .  Cancer Paternal Grandmother        Breast ca- dx in 3550's  . Thyroid disease Paternal Grandmother   . Breast cancer Paternal Grandmother   . Diabetes Paternal Grandfather   . Thyroid disease Sister   . Asthma Sister   . Allergies Brother   . Cancer Father        cigarette, alcohol HEENT squamous cell  . Alcohol abuse Father   . Arthritis Other   . Alcohol abuse Maternal Aunt   . Alcohol abuse Maternal Uncle     Social History:  Social History   Socioeconomic History  . Marital status: Married    Spouse name: Not on file  . Number of children: Not on file  . Years of education: Not on file  . Highest education level: Not on file  Occupational History  . Not on file  Social Needs  . Financial resource strain: Not on file  . Food insecurity    Worry: Not on file    Inability: Not on file  . Transportation needs    Medical: Not on file    Non-medical: Not on file  Tobacco Use  . Smoking status: Current Some Day Smoker    Packs/day: 0.10    Types: Cigars  . Smokeless tobacco: Never Used  . Tobacco comment: 1 black and milds  Substance and Sexual Activity  . Alcohol use: Yes    Comment: twice/year  . Drug use: No  . Sexual activity: Not on file  Lifestyle  . Physical activity    Days per week: Not on file    Minutes per session: Not on file  . Stress: Not on file  Relationships  . Social Musicianconnections    Talks on phone: Not on file    Gets together: Not on file    Attends religious service: Not on file    Active member of club or organization: Not on file    Attends meetings of clubs or organizations: Not on file    Relationship status: Not on file  Other Topics Concern  . Not on file  Social History Narrative   Married lives w/ husband, works at [employer]    Allergies: No Known Allergies  Metabolic Disorder Labs: Lab Results  Component Value Date   HGBA1C 6.9 (H) 10/14/2018   MPG 140 (H) 05/19/2014   MPG 148 (H) 10/14/2013   No results found for:  PROLACTIN Lab Results  Component Value Date   CHOL 159 01/14/2018   TRIG 155.0 (H) 01/14/2018   HDL 67.60 01/14/2018   CHOLHDL 2 01/14/2018   VLDL 31.0 01/14/2018   LDLCALC 61 01/14/2018   LDLCALC 43 10/13/2017   Lab Results  Component Value Date   TSH 1.29 01/14/2018   TSH 0.86 10/13/2017    Therapeutic Level Labs: No results found for: LITHIUM No results found for: VALPROATE No components found for:  CBMZ  Current Medications: Current Outpatient Medications  Medication Sig Dispense Refill  . ACETAMINOPHEN-BUTALBITAL 50-325 MG TABS Take 1 tablet by mouth 2 (two) times daily as needed (HEADACHES). 120 each 0  . ALPRAZolam (XANAX) 0.5 MG tablet Take 1 tablet (0.5 mg total) by mouth 2 (two) times daily as needed for anxiety. 60 tablet 2  . amphetamine-dextroamphetamine (ADDERALL XR) 25 MG 24 hr capsule Take 1 capsule by mouth daily. 30 capsule 0  . azelastine (ASTELIN) 0.1 % nasal spray Place 2 sprays into both nostrils 2 (two) times daily. Use in each nostril as directed 30 mL 12  . BAYER MICROLET LANCETS lancets Use as directed once daily to check blood sugar.   DX E11.9 100 each 1  . Blood Glucose Monitoring Suppl (CONTOUR BLOOD GLUCOSE SYSTEM) DEVI Use daily to check blood sugar. DX E11.9 1 Device 0  . busPIRone (BUSPAR) 15 MG tablet Take 1 tablet (15 mg total) by mouth 2 (two) times daily. 180 tablet 0  . conjugated estrogens (PREMARIN) vaginal cream Place 1 Applicatorful vaginally once a week. 42.5 g 121  . CONTOUR NEXT TEST test strip USE TO CHECK BLOOD SUGAR DAILY 100 each 2  . cyclobenzaprine (FLEXERIL) 10 MG tablet TAKE 1 TABLET(10 MG) BY MOUTH TWICE DAILY AS NEEDED FOR MUSCLE SPASMS 60 tablet 0  . famotidine (PEPCID) 20 MG tablet Take 1 tablet (20 mg total) by mouth daily. 90 tablet 1  . fluconazole (DIFLUCAN) 150 MG tablet Take 150 mg by mouth as needed.    Marland Kitchen. FLUoxetine (PROZAC) 20 MG capsule Take 1 capsule (20 mg total) by mouth 3 (three) times daily. 90 capsule 5  .  fluticasone (FLONASE) 50 MCG/ACT nasal spray Place 2 sprays into both nostrils daily. 16 g 6  . gabapentin (NEURONTIN) 100 MG capsule TAKE 1 CAPSULE(100 MG) BY MOUTH AT BEDTIME 30 capsule 0  . gabapentin (NEURONTIN) 300 MG capsule Take 300 mg by mouth 2 (two) times daily.    Marland Kitchen. HYDROcodone-acetaminophen (NORCO/VICODIN) 5-325 MG tablet Take 1 tablet by mouth every 6 (six) hours as needed. Patient needs appointment for further refills. 100 tablet 0  . hyoscyamine (LEVSIN SL) 0.125 MG SL tablet DISSOLVE 1 TABLET(0.125 MG) UNDER THE TONGUE EVERY 4 HOURS AS NEEDED 30 tablet 0  . losartan (COZAAR) 50 MG tablet Take 1 tablet (50 mg total) by mouth daily. 90 tablet 2  . meloxicam (MOBIC) 15 MG tablet Take 1 tablet (15 mg total) by mouth daily as needed for pain. 90 tablet 1  . metFORMIN (GLUCOPHAGE) 500 MG tablet TAKE 2 TABLETS BY MOUTH TWICE DAILY 360 tablet 1  . montelukast (SINGULAIR) 10 MG tablet TAKE 1 TABLET(10 MG) BY MOUTH AT BEDTIME 90 tablet 1  . omeprazole (PRILOSEC) 20 MG capsule Take 1 capsule (20 mg total) by mouth 2 (two) times daily before a meal. 180 capsule 3  . promethazine (PHENERGAN) 25 MG tablet Take 1 tablet (25 mg total) by mouth every 8 (eight) hours as needed for nausea or vomiting. 20 tablet 0  . propranolol (INDERAL) 40 MG tablet Take 1 tablet (40 mg total) by mouth 2 (two) times daily. 60 tablet 11  . rosuvastatin (CRESTOR) 5 MG tablet Take 1 tablet (5 mg total) by mouth 3 (three) times a week. 45 tablet 3  . sodium chloride (OCEAN) 0.65 % SOLN nasal spray Place 1 spray into both nostrils daily.    . SUMAtriptan (IMITREX) 50 MG tablet Take 1 tablet (50 mg total) by mouth every  2 (two) hours as needed for migraine. May repeat in 2 hours if headache persists or recurs. 10 tablet 3  . zolpidem (AMBIEN) 10 MG tablet Take 1 tablet (10 mg total) by mouth at bedtime as needed for sleep. 30 tablet 3   No current facility-administered medications for this visit.      Psychiatric  Specialty Exam: Review of Systems  Psychiatric/Behavioral: The patient is nervous/anxious.   All other systems reviewed and are negative.   Last menstrual period 01/08/2013.There is no height or weight on file to calculate BMI.  General Appearance: NA  Eye Contact:  NA  Speech:  Clear and Coherent and Normal Rate  Volume:  Normal  Mood:  Anxious  Affect:  NA  Thought Process:  Goal Directed and Linear  Orientation:  Full (Time, Place, and Person)  Thought Content: Logical   Suicidal Thoughts:  No  Homicidal Thoughts:  No  Memory:  Immediate;   Good Recent;   Good Remote;   Good  Judgement:  Good  Insight:  Good  Psychomotor Activity:  NA  Concentration:  Concentration: Fair  Recall:  Good  Fund of Knowledge: Good  Language: Good  Akathisia:  Negative  Handed:  Right  AIMS (if indicated): not done  Assets:  Communication Skills Desire for Improvement Financial Resources/Insurance Housing Social Support Talents/Skills Vocational/Educational  ADL's:  Intact  Cognition: WNL  Sleep:  Fair    Assessment and Plan: Patient is a 43 yo married AAF with a long standing hx of depression (on and off) and generalized anxiety. She used to have frequent panic attacks and was prescribed alprazolam 0.5 mg tid prn these. They are much less frequent now but they still occur occasionally. She was taken off alprazolam when a decision to use hydrocodone for migraine headaches was made. She is now on buspirone and fluoxetine combination for depression/anxiety. In the past she tried citalopram and venlafaxine but could not tolerate excessive sweating and other adverse effects which now, in perspective, she thinks might have been more related to her menopausal symptoms. She admits that in the past she had passive suicidal thoughts when depression was "really bed". She never admitted to those however and was never treated as inpatient on a psychiatric ward. She has no hx of mania or psychosis. She  denies having any personal hx of alcohol or drug abuse. There is a hx of alcohol abuse on both sides of the family.   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 She has never been formally diagnosed with ADD but at one time took a colleague's Adderall and noticed major improvement in concentration. We added Adderall XR 20 mg and she noticed improvement but still has some problems with focusing. Alprazolam was restarted at 0.5 mg but she will at times need toi take 2 tablets (ie 1 mg dose).   Dx: GAD; Panic disorder; ADD adult type; MDD recurrent, mild  Plan: Continue Prozac 60 mg daily, Buspar change to 15 mg bid, zolpidem 10 mg prn sleep. Increase alprazolam to 0.5 mg bid prn panic attacks and Adderall xr to 25 mg daily for add. Next appointment in one month.  The plan was discussed with patient who had an opportunity to ask questions and these were all answered. I spend 25 minutes in phone consultation with the patient    Nicole Ball A  Sanaiyah Kirchhoff, MD 03/25/2019, 9:42 AM

## 2019-03-30 ENCOUNTER — Other Ambulatory Visit: Payer: Self-pay | Admitting: Family Medicine

## 2019-03-30 DIAGNOSIS — M25562 Pain in left knee: Secondary | ICD-10-CM

## 2019-03-30 MED ORDER — HYDROCODONE-ACETAMINOPHEN 5-325 MG PO TABS: 1.0000 | ORAL_TABLET | Freq: Four times a day (QID) | ORAL | 0 refills | Status: DC | PRN

## 2019-03-30 MED ORDER — PROMETHAZINE HCL 25 MG PO TABS: 25.0000 mg | ORAL_TABLET | Freq: Three times a day (TID) | ORAL | 0 refills | Status: DC | PRN

## 2019-04-08 ENCOUNTER — Ambulatory Visit (INDEPENDENT_AMBULATORY_CARE_PROVIDER_SITE_OTHER): Payer: 59 | Admitting: Family Medicine

## 2019-04-08 ENCOUNTER — Other Ambulatory Visit: Payer: Self-pay

## 2019-04-08 DIAGNOSIS — E782 Mixed hyperlipidemia: Secondary | ICD-10-CM

## 2019-04-08 DIAGNOSIS — I10 Essential (primary) hypertension: Secondary | ICD-10-CM

## 2019-04-08 DIAGNOSIS — E1169 Type 2 diabetes mellitus with other specified complication: Secondary | ICD-10-CM

## 2019-04-08 DIAGNOSIS — E669 Obesity, unspecified: Secondary | ICD-10-CM | POA: Diagnosis not present

## 2019-04-08 DIAGNOSIS — K449 Diaphragmatic hernia without obstruction or gangrene: Secondary | ICD-10-CM

## 2019-04-08 DIAGNOSIS — F410 Panic disorder [episodic paroxysmal anxiety] without agoraphobia: Secondary | ICD-10-CM | POA: Diagnosis not present

## 2019-04-08 DIAGNOSIS — K219 Gastro-esophageal reflux disease without esophagitis: Secondary | ICD-10-CM

## 2019-04-10 NOTE — Progress Notes (Signed)
Virtual Visit via Video Note  I connected with Nicole Ball on 04/08/19 at  9:40 AM EDT by a video enabled telemedicine application and verified that I am speaking with the correct person using two identifiers.  Location: Patient: home Provider: home   I discussed the limitations of evaluation and management by telemedicine and the availability of in person appointments. The patient expressed understanding and agreed to proceed. Nicole Ball, CMA was able to get patient set up on visit, video   Subjective:    Patient ID: Nicole Ball, female    DOB: 1976-04-07, 43 y.o.   MRN: [id number]  No chief complaint on file.   HPI Patient is in today for follow up on chronic medical concerns including allergies, hyperlipidemia, anxiety and more. She is doing well and is following with psychiatry now for anxiety, depression and agoraphobia. She is denying any recent febrile illness or hospitalizations. Denies CP/palp/SOB/HA/congestion/fevers/GI or GU c/o. Taking meds as prescribed  Past Medical History:  Diagnosis Date   Abnormal serum level of alkaline phosphatase 05/21/2014   Acute low back pain 12/19/2010   ALLERGIC RHINITIS 10/16/2010   Allergy    seasonal   Anxiety and depression 06/07/2011   Atrophic vaginitis 08/17/2016   Cervical cancer screening 01/10/2014   Menarche at 8 Regular and moderate flow, became irregular until OCPs  history of abnormal pap in past, repeat was normal Last pap roughly 3 years ago G0P0, s/p No history of abnormal MGM, no h/o MGM  No concerns today no gyn surgeries LMP May 2013   CTS (carpal tunnel syndrome) 01/15/2014   Diabetes mellitus type 2 in obese ([place]) 10/30/2011   Dry eyes 08/17/2016   Early menopause    Elevated BP 04/21/2011   Essential hypertension    HIATAL HERNIA WITH REFLUX, HX OF 10/16/2010   Hyperlipidemia 04/21/2011   Inappropriate sinus tachycardia    INSOMNIA 10/16/2010   Knee pain, left 03/11/2012   Migraine 03/07/2013    Obesity    Obesity (BMI 30-39.9) 01/12/2018   Palpitations 01/28/2015   Reflux 04/22/2012   Sleep apnea 06/07/2011   Tobacco use disorder 02/10/2017   Urinary urgency 08/17/2016   UTI'S, HX OF 10/16/2010    Past Surgical History:  Procedure Laterality Date   CHOLECYSTECTOMY      Family History  Problem Relation Age of Onset   Lupus Mother    Arthritis Maternal Grandmother    Scleroderma Maternal Grandmother    Glaucoma Maternal Grandmother    Cataracts Maternal Grandmother    Cancer Maternal Grandfather        prostate   Coronary artery disease Maternal Grandfather        s/p angioplasty   Diabetes Maternal Grandfather        Type 2   Cancer Paternal Grandmother        Breast ca- dx in 50's   Thyroid disease Paternal Grandmother    Breast cancer Paternal Grandmother    Diabetes Paternal Grandfather    Thyroid disease Sister    Asthma Sister    Allergies Brother    Cancer Father        cigarette, alcohol HEENT squamous cell   Alcohol abuse Father    Arthritis Other    Alcohol abuse Maternal Aunt    Alcohol abuse Maternal Uncle     Social History   Socioeconomic History   Marital status: Married    Spouse name: Not on file   Number of children: Not on file  Years of education: Not on file   Highest education level: Not on file  Occupational History   Not on file  Social Needs   Financial resource strain: Not on file   Food insecurity    Worry: Not on file    Inability: Not on file   Transportation needs    Medical: Not on file    Non-medical: Not on file  Tobacco Use   Smoking status: Current Some Day Smoker    Packs/day: 0.10    Types: Cigars   Smokeless tobacco: Never Used   Tobacco comment: 1 black and milds  Substance and Sexual Activity   Alcohol use: Yes    Comment: twice/year   Drug use: No   Sexual activity: Not on file  Lifestyle   Physical activity    Days per week: Not on file    Minutes per  session: Not on file   Stress: Not on file  Relationships   Social connections    Talks on phone: Not on file    Gets together: Not on file    Attends religious service: Not on file    Active member of club or organization: Not on file    Attends meetings of clubs or organizations: Not on file    Relationship status: Not on file   Intimate partner violence    Fear of current or ex partner: Not on file    Emotionally abused: Not on file    Physically abused: Not on file    Forced sexual activity: Not on file  Other Topics Concern   Not on file  Social History Narrative   Married lives w/ husband, works at [employer]    Outpatient Medications Prior to Visit  Medication Sig Dispense Refill   ACETAMINOPHEN-BUTALBITAL 50-325 MG TABS Take 1 tablet by mouth 2 (two) times daily as needed (HEADACHES). 120 each 0   ALPRAZolam (XANAX) 0.5 MG tablet Take 1 tablet (0.5 mg total) by mouth 2 (two) times daily as needed for anxiety. 60 tablet 2   amphetamine-dextroamphetamine (ADDERALL XR) 25 MG 24 hr capsule Take 1 capsule by mouth daily. 30 capsule 0   azelastine (ASTELIN) 0.1 % nasal spray Place 2 sprays into both nostrils 2 (two) times daily. Use in each nostril as directed 30 mL 12   BAYER MICROLET LANCETS lancets Use as directed once daily to check blood sugar.   DX E11.9 100 each 1   Blood Glucose Monitoring Suppl (CONTOUR BLOOD GLUCOSE SYSTEM) DEVI Use daily to check blood sugar. DX E11.9 1 Device 0   busPIRone (BUSPAR) 15 MG tablet Take 1 tablet (15 mg total) by mouth 2 (two) times daily. 180 tablet 0   conjugated estrogens (PREMARIN) vaginal cream Place 1 Applicatorful vaginally once a week. 42.5 g 121   CONTOUR NEXT TEST test strip USE TO CHECK BLOOD SUGAR DAILY 100 each 2   cyclobenzaprine (FLEXERIL) 10 MG tablet TAKE 1 TABLET(10 MG) BY MOUTH TWICE DAILY AS NEEDED FOR MUSCLE SPASMS 60 tablet 0   famotidine (PEPCID) 20 MG tablet Take 1 tablet (20 mg total) by mouth  daily. 90 tablet 1   fluconazole (DIFLUCAN) 150 MG tablet Take 150 mg by mouth as needed.     FLUoxetine (PROZAC) 20 MG capsule Take 1 capsule (20 mg total) by mouth 3 (three) times daily. 90 capsule 5   fluticasone (FLONASE) 50 MCG/ACT nasal spray Place 2 sprays into both nostrils daily. 16 g 6   gabapentin (NEURONTIN) 100  MG capsule TAKE 1 CAPSULE(100 MG) BY MOUTH AT BEDTIME 30 capsule 0   gabapentin (NEURONTIN) 300 MG capsule Take 300 mg by mouth 2 (two) times daily.     HYDROcodone-acetaminophen (NORCO/VICODIN) 5-325 MG tablet Take 1 tablet by mouth every 6 (six) hours as needed. Patient needs appointment for further refills. 100 tablet 0   hyoscyamine (LEVSIN SL) 0.125 MG SL tablet DISSOLVE 1 TABLET(0.125 MG) UNDER THE TONGUE EVERY 4 HOURS AS NEEDED 30 tablet 0   losartan (COZAAR) 50 MG tablet Take 1 tablet (50 mg total) by mouth daily. 90 tablet 2   meloxicam (MOBIC) 15 MG tablet Take 1 tablet (15 mg total) by mouth daily as needed for pain. 90 tablet 1   metFORMIN (GLUCOPHAGE) 500 MG tablet TAKE 2 TABLETS BY MOUTH TWICE DAILY 360 tablet 1   montelukast (SINGULAIR) 10 MG tablet TAKE 1 TABLET(10 MG) BY MOUTH AT BEDTIME 90 tablet 1   omeprazole (PRILOSEC) 20 MG capsule Take 1 capsule (20 mg total) by mouth 2 (two) times daily before a meal. 180 capsule 3   promethazine (PHENERGAN) 25 MG tablet Take 1 tablet (25 mg total) by mouth every 8 (eight) hours as needed for nausea or vomiting. 20 tablet 0   propranolol (INDERAL) 40 MG tablet Take 1 tablet (40 mg total) by mouth 2 (two) times daily. 60 tablet 11   rosuvastatin (CRESTOR) 5 MG tablet Take 1 tablet (5 mg total) by mouth 3 (three) times a week. 45 tablet 3   sodium chloride (OCEAN) 0.65 % SOLN nasal spray Place 1 spray into both nostrils daily.     SUMAtriptan (IMITREX) 50 MG tablet Take 1 tablet (50 mg total) by mouth every 2 (two) hours as needed for migraine. May repeat in 2 hours if headache persists or recurs. 10 tablet  3   zolpidem (AMBIEN) 10 MG tablet Take 1 tablet (10 mg total) by mouth at bedtime as needed for sleep. 30 tablet 3   No facility-administered medications prior to visit.     No Known Allergies  Review of Systems  Constitutional: Positive for malaise/fatigue. Negative for fever.  HENT: Negative for congestion.   Eyes: Negative for blurred vision.  Respiratory: Negative for shortness of breath.   Cardiovascular: Positive for palpitations. Negative for chest pain and leg swelling.  Gastrointestinal: Negative for abdominal pain, blood in stool and nausea.  Genitourinary: Negative for dysuria and frequency.  Musculoskeletal: Negative for falls.  Skin: Negative for rash.  Neurological: Negative for dizziness, loss of consciousness and headaches.  Endo/Heme/Allergies: Negative for environmental allergies.  Psychiatric/Behavioral: Negative for depression. The patient is not nervous/anxious.        Objective:    Physical Exam Vitals signs and nursing note reviewed.  Constitutional:      General: She is not in acute distress.    Appearance: She is well-developed.  HENT:     Head: Normocephalic and atraumatic.     Nose: Nose normal.  Eyes:     General:        Right eye: No discharge.        Left eye: No discharge.  Neck:     Musculoskeletal: Normal range of motion and neck supple.  Cardiovascular:     Rate and Rhythm: Normal rate and regular rhythm.     Heart sounds: No murmur.  Pulmonary:     Effort: Pulmonary effort is normal.     Breath sounds: Normal breath sounds.  Abdominal:     General: Bowel  sounds are normal.     Palpations: Abdomen is soft.     Tenderness: There is no abdominal tenderness.  Skin:    General: Skin is warm and dry.  Neurological:     Mental Status: She is alert and oriented to person, place, and time.     LMP 01/08/2013  Wt Readings from Last 3 Encounters:  03/04/19 204 lb (92.5 kg)  12/09/18 195 lb (88.5 kg)  10/14/18 194 lb 3.2 oz (88.1  kg)    Diabetic Foot Exam - Simple   No data filed     Lab Results  Component Value Date   WBC 7.2 01/14/2018   HGB 13.2 01/14/2018   HCT 41.0 01/14/2018   PLT 200.0 01/14/2018   GLUCOSE 93 01/14/2018   CHOL 159 01/14/2018   TRIG 155.0 (H) 01/14/2018   HDL 67.60 01/14/2018   LDLCALC 61 01/14/2018   ALT 16 01/14/2018   AST 11 01/14/2018   NA 137 01/14/2018   K 3.9 01/14/2018   CL 102 01/14/2018   CREATININE 0.68 01/14/2018   BUN 11 01/14/2018   CO2 22 01/14/2018   TSH 1.29 01/14/2018   HGBA1C 6.9 (H) 10/14/2018   MICROALBUR <0.7 09/17/2015    Lab Results  Component Value Date   TSH 1.29 01/14/2018   Lab Results  Component Value Date   WBC 7.2 01/14/2018   HGB 13.2 01/14/2018   HCT 41.0 01/14/2018   MCV 88.8 01/14/2018   PLT 200.0 01/14/2018   Lab Results  Component Value Date   NA 137 01/14/2018   K 3.9 01/14/2018   CO2 22 01/14/2018   GLUCOSE 93 01/14/2018   BUN 11 01/14/2018   CREATININE 0.68 01/14/2018   BILITOT 0.2 01/14/2018   ALKPHOS 143 (H) 01/14/2018   AST 11 01/14/2018   ALT 16 01/14/2018   PROT 7.4 01/14/2018   ALBUMIN 4.3 01/14/2018   CALCIUM 10.1 01/14/2018   GFR 121.09 01/14/2018   Lab Results  Component Value Date   CHOL 159 01/14/2018   Lab Results  Component Value Date   HDL 67.60 01/14/2018   Lab Results  Component Value Date   LDLCALC 61 01/14/2018   Lab Results  Component Value Date   TRIG 155.0 (H) 01/14/2018   Lab Results  Component Value Date   CHOLHDL 2 01/14/2018   Lab Results  Component Value Date   HGBA1C 6.9 (H) 10/14/2018       Assessment & Plan:   Problem List Items Addressed This Visit    Hiatal hernia with gastroesophageal reflux    Avoid offending foods, start probiotics. Do not eat large meals in late evening and consider raising head of bed.       Hyperlipidemia    Encouraged heart healthy diet, increase exercise, avoid trans fats, consider a krill oil cap daily      Relevant Orders    Lipid panel   Diabetes mellitus type 2 in obese ([facility])    hgba1c acceptable, minimize simple carbs. Increase exercise as tolerated. Continue current meds      HTN (hypertension) - Primary    Check vitals weeklyno changes to meds. Encouraged heart healthy diet such as the DASH diet and exercise as tolerated.       Relevant Orders   CBC   Comprehensive metabolic panel   TSH   Obesity (BMI 30-39.9)   Relevant Orders   Hemoglobin A1c   TSH   Panic disorder without agoraphobia    Along with  depression is doing some better since she established with psychiatry. They have also started her on adderall xr 25 mg daily          I am having Krystel Z. Vanepps maintain her [employer], Contour Next Test, ACETAMINOPHEN-BUTALBITAL, CONTOUR BLOOD GLUCOSE SYSTEM, conjugated estrogens, meloxicam, omeprazole, losartan, famotidine, fluticasone, azelastine, montelukast, propranolol, gabapentin, sodium chloride, fluconazole, rosuvastatin, cyclobenzaprine, gabapentin, hyoscyamine, metFORMIN, busPIRone, FLUoxetine, zolpidem, SUMAtriptan, ALPRAZolam, amphetamine-dextroamphetamine, promethazine, and HYDROcodone-acetaminophen.  No orders of the defined types were placed in this encounter.    I discussed the assessment and treatment plan with the patient. The patient was provided an opportunity to ask questions and all were answered. The patient agreed with the plan and demonstrated an understanding of the instructions.   The patient was advised to call back or seek an in-person evaluation if the symptoms worsen or if the condition fails to improve as anticipated.  I provided 25 minutes of non-face-to-face time during this encounter.   Danise Edge, MD

## 2019-04-10 NOTE — Assessment & Plan Note (Signed)
Along with depression is doing some better since she established with psychiatry. They have also started her on adderall xr 25 mg daily

## 2019-04-10 NOTE — Assessment & Plan Note (Signed)
hgba1c acceptable, minimize simple carbs. Increase exercise as tolerated. Continue current meds 

## 2019-04-10 NOTE — Assessment & Plan Note (Signed)
Encouraged heart healthy diet, increase exercise, avoid trans fats, consider a krill oil cap daily 

## 2019-04-10 NOTE — Assessment & Plan Note (Signed)
Avoid offending foods, start probiotics. Do not eat large meals in late evening and consider raising head of bed.  

## 2019-04-10 NOTE — Assessment & Plan Note (Signed)
Check vitals weekly no changes to meds. Encouraged heart healthy diet such as the DASH diet and exercise as tolerated.  

## 2019-04-15 ENCOUNTER — Other Ambulatory Visit: Payer: 59

## 2019-04-15 ENCOUNTER — Ambulatory Visit: Payer: 59

## 2019-04-26 ENCOUNTER — Ambulatory Visit: Payer: 59 | Admitting: Medical

## 2019-04-26 ENCOUNTER — Other Ambulatory Visit: Payer: Self-pay

## 2019-04-27 ENCOUNTER — Ambulatory Visit (HOSPITAL_COMMUNITY): Payer: 59 | Admitting: Psychiatry

## 2019-04-28 ENCOUNTER — Other Ambulatory Visit: Payer: Self-pay

## 2019-04-28 ENCOUNTER — Ambulatory Visit (INDEPENDENT_AMBULATORY_CARE_PROVIDER_SITE_OTHER): Payer: 59 | Admitting: Medical

## 2019-04-28 ENCOUNTER — Telehealth: Payer: Self-pay | Admitting: Medical

## 2019-04-28 ENCOUNTER — Encounter: Payer: Self-pay | Admitting: Medical

## 2019-04-28 ENCOUNTER — Other Ambulatory Visit: Payer: Self-pay | Admitting: Family Medicine

## 2019-04-28 VITALS — BP 133/95 | HR 94 | Temp 97.1°F | Ht 63.0 in | Wt 196.0 lb

## 2019-04-28 DIAGNOSIS — I10 Essential (primary) hypertension: Secondary | ICD-10-CM | POA: Diagnosis not present

## 2019-04-28 DIAGNOSIS — R05 Cough: Secondary | ICD-10-CM | POA: Diagnosis not present

## 2019-04-28 DIAGNOSIS — K219 Gastro-esophageal reflux disease without esophagitis: Secondary | ICD-10-CM | POA: Diagnosis not present

## 2019-04-28 DIAGNOSIS — M25562 Pain in left knee: Secondary | ICD-10-CM

## 2019-04-28 DIAGNOSIS — R059 Cough, unspecified: Secondary | ICD-10-CM

## 2019-04-28 DIAGNOSIS — J3489 Other specified disorders of nose and nasal sinuses: Secondary | ICD-10-CM

## 2019-04-28 DIAGNOSIS — R195 Other fecal abnormalities: Secondary | ICD-10-CM | POA: Diagnosis not present

## 2019-04-28 DIAGNOSIS — Z20822 Contact with and (suspected) exposure to covid-19: Secondary | ICD-10-CM

## 2019-04-28 DIAGNOSIS — R110 Nausea: Secondary | ICD-10-CM

## 2019-04-28 MED ORDER — PROMETHAZINE HCL 25 MG PO TABS: 25.0000 mg | ORAL_TABLET | Freq: Three times a day (TID) | ORAL | 0 refills | Status: DC | PRN

## 2019-04-28 MED ORDER — HYDROCODONE-ACETAMINOPHEN 5-325 MG PO TABS: 1.0000 | ORAL_TABLET | Freq: Four times a day (QID) | ORAL | 0 refills | Status: DC | PRN

## 2019-04-28 MED ORDER — LEVOCETIRIZINE DIHYDROCHLORIDE 5 MG PO TABS: 5.0000 mg | ORAL_TABLET | Freq: Every evening | ORAL | 3 refills | Status: DC

## 2019-04-28 MED ORDER — SUCRALFATE 1 G PO TABS: ORAL_TABLET | ORAL | 0 refills | Status: DC

## 2019-04-28 MED ORDER — AZITHROMYCIN 250 MG PO TABS: ORAL_TABLET | ORAL | 0 refills | Status: DC

## 2019-04-28 MED ORDER — FLUTICASONE PROPIONATE 50 MCG/ACT NA SUSP: 2.0000 | Freq: Every day | NASAL | 1 refills | Status: DC

## 2019-04-28 MED ORDER — FLUCONAZOLE 150 MG PO TABS: 150.0000 mg | ORAL_TABLET | ORAL | 0 refills | Status: DC | PRN

## 2019-04-28 NOTE — Telephone Encounter (Signed)
I have sent in.  

## 2019-04-28 NOTE — Patient Instructions (Signed)
You have various signs/symptoms today. Some might represent covid infection so will place [facility]  Advised stay at home while we await test results and see how you do clinically with tx today.  For possible allergies and possible sinus infection advise use xyzal and flonase. Restart you astelin as well. Also rx azithromycin.  For recent flare of heart burn/gerd, continue pepcid, omeprazole and will add carafate. Eat healthy as well. If symptom persist will refer you back to gi for likely egd.  For nausea refilled your phenergan.  For loose stools, hydrate well, eat bland foods and can use immodium. If covid negative and loose stools persist then will have you do gastro panel study. Also if covid negative then you should be able to get labs done in our office if needed.  Follow up in 7 days or as needed.  Pt works from home but did send work note if she needs to use.

## 2019-04-28 NOTE — Progress Notes (Signed)
Subjective:    Patient ID: Nicole Ball, female    DOB: June 20, 1976, 43 y.o.   MRN: [id number]  HPI  Virtual Visit via Telephone Note  I connected with Nicole Ball on 04/28/19 at 11:20 AM EDT by telephone and verified that I am speaking with the correct person using two identifiers.  Location: Patient: home Provider: office   I discussed the limitations, risks, security and privacy concerns of performing an evaluation and management service by telephone and the availability of in person appointments. I also discussed with the patient that there may be a patient responsible charge related to this service. The patient expressed understanding and agreed to proceed.     History of Present Illness: Pt had various symptoms recently with diarrhea, upset stomach, nasal congestion, pnd, sinus pressure and sneezing.   Also some heart burn recenltly on and off. Hx of hiatal hernia. Have pepcid, prilosec and tums. Some improvement but still symptoms. Scared to eat due to pain. No chest pain or cardiac sign/symptoms reported.   She also reports no diffuse body aches.   Pt states her husband tested positive for covid more than month ago. Pt states she was sick back then but she never got tested.   Pt states all her symptoms started on Monday.  The diarrhea worse early in week. Was watery. She took imodium and still has some loose stools but much less than before.  Pt had intermittent sinus pain. Sinus pain today on self palpation. Both over frontal and maxillary sinus.   Some cough from pnd. Not severe cough.When does cough it is dry.  Pt bp and pulse was elevated this morning. She had not taken b blocker. But she did take the adderral. Later bp and pulse improved a lot.   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 Some coworkers have tested positive.      Observations/Objective: General- no acute distress, pleasant, orient and normal speech.     Assessment and Plan: You have various signs/symptoms today. Some might represent covid infection so will place order [facility].  Advised stay at home while we await test results and see how you do clinically with tx today.  For possible allergies and possible sinus infection advise use xyzal and flonase. Restart you astelin as well. Also rx azithromycin.  For recent flare of heart burn/gerd, continue pepcid, omeprazole and will add carafate. Eat healthy as well. If symptom persist will refer you back to gi for likely egd.  For nausea refilled your phenergan.  For loose stools, hydrate well, eat bland foods and can use immodium. If covid negative and loose stools persist then will have you do gastro panel study. Also if covid negative then you should be able to get labs done in our office if needed.  Follow up in 7 days or as needed.  Pt works from home but did send work note if she needs to use.  [name] Trudell, PA-C   40 minutes spent with pt. 50% of time spent with pt cousneling on dx and tx. Also answered pt questions.  Follow Up Instructions:    I discussed the assessment and treatment plan with the patient. The patient was provided an opportunity to ask questions and all were answered. The patient agreed with the plan and demonstrated an understanding of the instructions.   The patient was advised to call back or seek an in-person evaluation if the symptoms  worsen or if the condition fails to improve as anticipated.  I provided 25 minutes of non-face-to-face time during this encounter.   Mackie Pai, PA-C   Review of Systems  Constitutional: Positive for fatigue. Negative for chills and fever.  HENT: Positive for congestion, sinus pressure and sneezing.   Respiratory: Negative for chest tightness, shortness of breath and wheezing.   Cardiovascular: Negative for chest pain and palpitations.  Gastrointestinal: Positive for abdominal pain and  diarrhea. Negative for abdominal distention, constipation, nausea and vomiting.  Genitourinary: Negative for dysuria.  Musculoskeletal: Negative for back pain, myalgias and neck pain.  Skin: Negative for rash.  Neurological: Negative for facial asymmetry and light-headedness.  Hematological: Negative for adenopathy. Does not bruise/bleed easily.  Psychiatric/Behavioral: The patient is not nervous/anxious.        Objective:   Physical Exam        Assessment & Plan:

## 2019-04-28 NOTE — Telephone Encounter (Signed)
Pt resting refill of her norco. Little early and get 100 tabs a month. Will you get Dr. Charlett Blake to fil.

## 2019-04-30 LAB — NOVEL CORONAVIRUS, NAA: SARS-CoV-2, NAA: NOT DETECTED

## 2019-05-02 ENCOUNTER — Ambulatory Visit (INDEPENDENT_AMBULATORY_CARE_PROVIDER_SITE_OTHER): Payer: 59 | Admitting: Psychiatry

## 2019-05-02 ENCOUNTER — Other Ambulatory Visit: Payer: Self-pay

## 2019-05-02 DIAGNOSIS — F410 Panic disorder [episodic paroxysmal anxiety] without agoraphobia: Secondary | ICD-10-CM | POA: Diagnosis not present

## 2019-05-02 DIAGNOSIS — F909 Attention-deficit hyperactivity disorder, unspecified type: Secondary | ICD-10-CM | POA: Diagnosis not present

## 2019-05-02 DIAGNOSIS — F411 Generalized anxiety disorder: Secondary | ICD-10-CM

## 2019-05-02 DIAGNOSIS — F330 Major depressive disorder, recurrent, mild: Secondary | ICD-10-CM | POA: Diagnosis not present

## 2019-05-02 MED ORDER — ALPRAZOLAM 1 MG PO TABS: 1.0000 mg | ORAL_TABLET | Freq: Two times a day (BID) | ORAL | 2 refills | Status: DC | PRN

## 2019-05-02 MED ORDER — BUSPIRONE HCL 15 MG PO TABS: 15.0000 mg | ORAL_TABLET | Freq: Two times a day (BID) | ORAL | 0 refills | Status: DC

## 2019-05-02 MED ORDER — ZOLPIDEM TARTRATE 10 MG PO TABS: 10.0000 mg | ORAL_TABLET | Freq: Every evening | ORAL | 2 refills | Status: DC | PRN

## 2019-05-02 MED ORDER — AMPHETAMINE-DEXTROAMPHET ER 30 MG PO CP24: 30.0000 mg | ORAL_CAPSULE | Freq: Every day | ORAL | 0 refills | Status: DC

## 2019-05-02 NOTE — Progress Notes (Signed)
BH MD/PA/NP OP Progress Note  05/02/2019 10:47 AM Nicole Ball  MRN:  [id number] Interview was conducted by phone and I verified that I was speaking with the correct person using two identifiers. I discussed the limitations of evaluation and management by telemedicine and  the availability of in person appointments. Patient expressed understanding and agreed to proceed.  Chief Complaint: Anxiety.  HPI: Patient is a 43 yo married AAF with a long standing hx of depression (on and off) and generalized anxiety. She used to have frequent panic attacks and was prescribed alprazolam 0.5 mg tid prn these. They are much less frequent now but they still occur occasionally. She was taken off alprazolam when a decision to use hydrocodone for migraine headaches was made. She is now on buspirone and fluoxetine combination for depression/anxiety. In the past she tried citalopram and venlafaxine but could not tolerate excessive sweating and other adverse effects which now, in perspective, she thinks might have been more related to her menopausal symptoms. She admits that in the past she had passive suicidal thoughts but never told anybody. She was never treated as inpatient on a psychiatric ward. She has no hx of mania or psychosis. 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 She has never been formally diagnosed with ADD but at one time took a colleague's Adderall and noticed major improvement in concentration. We added Adderall XR 20 mg and she noticed improvement but still has some problems with focusing. Alprazolam was restarted at 0.5 mg but she will at times need to take 2 tablets (ie 1 mg dose). Still high level of anxiety related to COVID.  Visit Diagnosis:    ICD-10-CM   1. Adult ADHD  F90.9   2. GAD (generalized anxiety  disorder)  F41.1   3. Major depressive disorder, recurrent, mild (HCC)  F33.0   4. Panic disorder without agoraphobia  F41.0     Past Psychiatric History: Please see intake H&P.  Past Medical History:  Past Medical History:  Diagnosis Date  . Abnormal serum level of alkaline phosphatase 05/21/2014  . Acute low back pain 12/19/2010  . ALLERGIC RHINITIS 10/16/2010  . Allergy    seasonal  . Anxiety and depression 06/07/2011  . Atrophic vaginitis 08/17/2016  . Cervical cancer screening 01/10/2014   Menarche at 8 Regular and moderate flow, became irregular until OCPs  history of abnormal pap in past, repeat was normal Last pap roughly 3 years ago G0P0, s/p No history of abnormal MGM, no h/o MGM  No concerns today no gyn surgeries LMP May 2013  . CTS (carpal tunnel syndrome) 01/15/2014  . Diabetes mellitus type 2 in obese (HCC) 10/30/2011  . Dry eyes 08/17/2016  . Early menopause   . Elevated BP 04/21/2011  . Essential hypertension   . HIATAL HERNIA WITH REFLUX, HX OF 10/16/2010  . Hyperlipidemia 04/21/2011  . Inappropriate sinus tachycardia   . INSOMNIA 10/16/2010  . Knee pain, left 03/11/2012  . Migraine 03/07/2013  . Obesity   . Obesity (BMI 30-39.9) 01/12/2018  . Palpitations 01/28/2015  . Reflux 04/22/2012  . Sleep apnea 06/07/2011  . Tobacco use disorder 02/10/2017  . Urinary urgency 08/17/2016  . UTI'S, HX OF 10/16/2010    Past Surgical History:  Procedure Laterality Date  . CHOLECYSTECTOMY      Family Psychiatric History: Reviewed.  Family History:  Family History  Problem Relation Age of Onset  . Lupus Mother   . Arthritis Maternal Grandmother   . Scleroderma Maternal Grandmother   . Glaucoma Maternal Grandmother   . Cataracts Maternal Grandmother   . Cancer Maternal Grandfather        prostate  . Coronary artery disease Maternal Grandfather        s/p angioplasty  . Diabetes Maternal Grandfather        Type 2  . Cancer Paternal Grandmother        Breast ca- dx in 70's  . Thyroid  disease Paternal Grandmother   . Breast cancer Paternal Grandmother   . Diabetes Paternal Grandfather   . Thyroid disease Sister   . Asthma Sister   . Allergies Brother   . Cancer Father        cigarette, alcohol HEENT squamous cell  . Alcohol abuse Father   . Arthritis Other   . Alcohol abuse Maternal Aunt   . Alcohol abuse Maternal Uncle     Social History:  Social History   Socioeconomic History  . Marital status: Married    Spouse name: Not on file  . Number of children: Not on file  . Years of education: Not on file  . Highest education level: Not on file  Occupational History  . Not on file  Social Needs  . Financial resource strain: Not on file  . Food insecurity    Worry: Not on file    Inability: Not on file  . Transportation needs    Medical: Not on file    Non-medical: Not on file  Tobacco Use  . Smoking status: Current Some Day Smoker    Packs/day: 0.10    Types: Cigars  . Smokeless tobacco: Never Used  . Tobacco comment: 1 black and milds  Substance and Sexual Activity  . Alcohol use: Yes    Comment: twice/year  . Drug use: No  . Sexual activity: Not on file  Lifestyle  . Physical activity    Days per week: Not on file    Minutes per session: Not on file  . Stress: Not on file  Relationships  . Social Musician on phone: Not on file    Gets together: Not on file    Attends religious service: Not on file    Active member of club or organization: Not on file    Attends meetings of clubs or organizations: Not on file    Relationship status: Not on file  Other Topics Concern  . Not on file  Social History Narrative   Married lives w/ husband, works at [employer]    Allergies: No Known Allergies  Metabolic Disorder Labs: Lab Results  Component Value Date   HGBA1C 6.9 (H) 10/14/2018   MPG 140 (H) 05/19/2014   MPG 148 (H) 10/14/2013   No results found for: PROLACTIN Lab Results  Component Value Date   CHOL 159 01/14/2018    TRIG 155.0 (H) 01/14/2018   HDL 67.60 01/14/2018   CHOLHDL 2 01/14/2018   VLDL 31.0 01/14/2018   LDLCALC 61 01/14/2018   LDLCALC 43 10/13/2017   Lab Results  Component Value Date   TSH 1.29 01/14/2018   TSH 0.86 10/13/2017    Therapeutic Level Labs: No results found for: LITHIUM No results found for: VALPROATE No components found for:  CBMZ  Current Medications: Current Outpatient Medications  Medication Sig Dispense Refill  . ACETAMINOPHEN-BUTALBITAL 50-325 MG TABS  Take 1 tablet by mouth 2 (two) times daily as needed (HEADACHES). 120 each 0  . ALPRAZolam (XANAX) 0.5 MG tablet Take 1 tablet (0.5 mg total) by mouth 2 (two) times daily as needed for anxiety. 60 tablet 2  . amphetamine-dextroamphetamine (ADDERALL XR) 25 MG 24 hr capsule Take 1 capsule by mouth daily. 30 capsule 0  . azelastine (ASTELIN) 0.1 % nasal spray Place 2 sprays into both nostrils 2 (two) times daily. Use in each nostril as directed 30 mL 12  . azithromycin (ZITHROMAX) 250 MG tablet Take 2 tablets by mouth on day 1, followed by 1 tablet by mouth daily for 4 days. 6 tablet 0  . BAYER MICROLET LANCETS lancets Use as directed once daily to check blood sugar.   DX E11.9 100 each 1  . Blood Glucose Monitoring Suppl (CONTOUR BLOOD GLUCOSE SYSTEM) DEVI Use daily to check blood sugar. DX E11.9 1 Device 0  . busPIRone (BUSPAR) 15 MG tablet Take 1 tablet (15 mg total) by mouth 2 (two) times daily. 180 tablet 0  . conjugated estrogens (PREMARIN) vaginal cream Place 1 Applicatorful vaginally once a week. 42.5 g 121  . CONTOUR NEXT TEST test strip USE TO CHECK BLOOD SUGAR DAILY 100 each 2  . cyclobenzaprine (FLEXERIL) 10 MG tablet TAKE 1 TABLET(10 MG) BY MOUTH TWICE DAILY AS NEEDED FOR MUSCLE SPASMS 60 tablet 0  . famotidine (PEPCID) 20 MG tablet Take 1 tablet (20 mg total) by mouth daily. 90 tablet 1  . fluconazole (DIFLUCAN) 150 MG tablet Take 1 tablet (150 mg total) by mouth as needed. 1 tablet 0  . FLUoxetine (PROZAC)  20 MG capsule Take 1 capsule (20 mg total) by mouth 3 (three) times daily. 90 capsule 5  . fluticasone (FLONASE) 50 MCG/ACT nasal spray Place 2 sprays into both nostrils daily. 16 g 6  . fluticasone (FLONASE) 50 MCG/ACT nasal spray Place 2 sprays into both nostrils daily. 16 g 1  . gabapentin (NEURONTIN) 100 MG capsule TAKE 1 CAPSULE(100 MG) BY MOUTH AT BEDTIME 30 capsule 0  . gabapentin (NEURONTIN) 300 MG capsule Take 300 mg by mouth 2 (two) times daily.    Marland Kitchen HYDROcodone-acetaminophen (NORCO/VICODIN) 5-325 MG tablet Take 1 tablet by mouth every 6 (six) hours as needed. Patient needs appointment for further refills. 100 tablet 0  . hyoscyamine (LEVSIN SL) 0.125 MG SL tablet DISSOLVE 1 TABLET(0.125 MG) UNDER THE TONGUE EVERY 4 HOURS AS NEEDED 30 tablet 0  . levocetirizine (XYZAL) 5 MG tablet Take 1 tablet (5 mg total) by mouth every evening. 30 tablet 3  . losartan (COZAAR) 50 MG tablet Take 1 tablet (50 mg total) by mouth daily. 90 tablet 2  . meloxicam (MOBIC) 15 MG tablet Take 1 tablet (15 mg total) by mouth daily as needed for pain. 90 tablet 1  . metFORMIN (GLUCOPHAGE) 500 MG tablet TAKE 2 TABLETS BY MOUTH TWICE DAILY 360 tablet 1  . montelukast (SINGULAIR) 10 MG tablet TAKE 1 TABLET(10 MG) BY MOUTH AT BEDTIME 90 tablet 1  . omeprazole (PRILOSEC) 20 MG capsule Take 1 capsule (20 mg total) by mouth 2 (two) times daily before a meal. 180 capsule 3  . promethazine (PHENERGAN) 25 MG tablet Take 1 tablet (25 mg total) by mouth every 8 (eight) hours as needed for nausea or vomiting. Refill when current expires. 20 tablet 0  . propranolol (INDERAL) 40 MG tablet Take 1 tablet (40 mg total) by mouth 2 (two) times daily. 60 tablet 11  .  rosuvastatin (CRESTOR) 5 MG tablet Take 1 tablet (5 mg total) by mouth 3 (three) times a week. 45 tablet 3  . sodium chloride (OCEAN) 0.65 % SOLN nasal spray Place 1 spray into both nostrils daily.    . sucralfate (CARAFATE) 1 g tablet 1 tab po tid 90 tablet 0  .  SUMAtriptan (IMITREX) 50 MG tablet Take 1 tablet (50 mg total) by mouth every 2 (two) hours as needed for migraine. May repeat in 2 hours if headache persists or recurs. 10 tablet 3  . zolpidem (AMBIEN) 10 MG tablet Take 1 tablet (10 mg total) by mouth at bedtime as needed for sleep. 30 tablet 3   No current facility-administered medications for this visit.       Psychiatric Specialty Exam: Review of Systems  Psychiatric/Behavioral: The patient is nervous/anxious.   All other systems reviewed and are negative.   Last menstrual period 01/08/2013.There is no height or weight on file to calculate BMI.  General Appearance: NA  Eye Contact:  NA  Speech:  Clear and Coherent and Normal Rate  Volume:  Normal  Mood:  Anxious  Affect:  NA  Thought Process:  Goal Directed and Linear  Orientation:  Full (Time, Place, and Person)  Thought Content: Logical   Suicidal Thoughts:  No  Homicidal Thoughts:  No  Memory:  Immediate;   Good Recent;   Good Remote;   Good  Judgement:  Good  Insight:  Good  Psychomotor Activity:  NA  Concentration:  Concentration: Fair  Recall:  Good  Fund of Knowledge: Good  Language: Good  Akathisia:  Negative  Handed:  Right  AIMS (if indicated): not done  Assets:  Communication Skills Desire for Improvement Financial Resources/Insurance Housing Physical Health Social Support Vocational/Educational  ADL's:  Intact  Cognition: WNL  Sleep:  Fair   Assessment and Plan: Patient is a 43 yo married AAF with a long standing hx of depression (on and off) and generalized anxiety. She used to have frequent panic attacks and was prescribed alprazolam 0.5 mg tid prn these. They are much less frequent now but they still occur occasionally. She was taken off alprazolam when a decision to use hydrocodone for migraine headaches was made. She is now on buspirone and fluoxetine combination for depression/anxiety. In the past she tried citalopram and venlafaxine but could  not tolerate excessive sweating and other adverse effects which now, in perspective, she thinks might have been more related to her menopausal symptoms. She admits that in the past she had passive suicidal thoughts but never told anybody. She was never treated as inpatient on a psychiatric ward. She has no hx of mania or psychosis. 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 She has never been formally diagnosed with ADD but at one time took a colleague's Adderall and noticed major improvement in concentration. We added Adderall XR 20 mg and she noticed improvement but still has some problems with focusing. Alprazolam was restarted at 0.5 mg but she will at times need to take 2 tablets (ie 1 mg dose). Still high level of anxiety related to COVID.  Dx: GAD; Panic disorder; ADD adult type; MDD recurrent, mild  Plan: Continue Prozac 60 mg daily, Buspar 15 mg bid, zolpidem 10 mg prn sleep and alprazolam to 1 mg bid prn panic attacks. WSe will increase Adderall XR  to 30 mg daily for add. Next appointment in two months.The plan was discussed with patient who had an opportunity to ask questions and these were all answered. I spend73minutes in phone consultation with the patient    Magdalene Patricia, MD 05/02/2019, 10:47 AM

## 2019-06-03 ENCOUNTER — Other Ambulatory Visit: Payer: Self-pay | Admitting: Family Medicine

## 2019-06-03 ENCOUNTER — Other Ambulatory Visit: Payer: Self-pay | Admitting: Medical

## 2019-06-03 DIAGNOSIS — M549 Dorsalgia, unspecified: Secondary | ICD-10-CM

## 2019-06-03 DIAGNOSIS — M25562 Pain in left knee: Secondary | ICD-10-CM

## 2019-06-03 MED ORDER — HYOSCYAMINE SULFATE 0.125 MG SL SUBL: 0.1250 mg | SUBLINGUAL_TABLET | SUBLINGUAL | 0 refills | Status: DC | PRN

## 2019-06-03 MED ORDER — PROMETHAZINE HCL 25 MG PO TABS: 25.0000 mg | ORAL_TABLET | Freq: Three times a day (TID) | ORAL | 0 refills | Status: DC | PRN

## 2019-06-03 MED ORDER — CYCLOBENZAPRINE HCL 10 MG PO TABS: 10.0000 mg | ORAL_TABLET | Freq: Every day | ORAL | 2 refills | Status: DC

## 2019-06-03 NOTE — Telephone Encounter (Signed)
Requesting:Vocodin  Contract:yes UDS:n/a Last OV:04/08/19 Next OV:n/a Last Refill:04/28/19  #[id number] Database:   Please advise

## 2019-06-04 ENCOUNTER — Other Ambulatory Visit: Payer: Self-pay | Admitting: Family Medicine

## 2019-06-04 DIAGNOSIS — M25562 Pain in left knee: Secondary | ICD-10-CM

## 2019-06-05 ENCOUNTER — Other Ambulatory Visit: Payer: Self-pay | Admitting: Family Medicine

## 2019-06-05 DIAGNOSIS — M25562 Pain in left knee: Secondary | ICD-10-CM

## 2019-06-05 MED ORDER — HYDROCODONE-ACETAMINOPHEN 5-325 MG PO TABS: 1.0000 | ORAL_TABLET | Freq: Four times a day (QID) | ORAL | 0 refills | Status: DC | PRN

## 2019-06-06 MED ORDER — HYDROCODONE-ACETAMINOPHEN 5-325 MG PO TABS: 1.0000 | ORAL_TABLET | Freq: Four times a day (QID) | ORAL | 0 refills | Status: DC | PRN

## 2019-06-06 NOTE — Telephone Encounter (Signed)
Looks like this was filled on 10/25 and 10/26 already. Please cancel one and I will cancel this request

## 2019-06-06 NOTE — Telephone Encounter (Signed)
Dr Charlett Blake-- I tried to deny the RX but it told me I do not have clearance.  Last hydrocodone acet RX: 06/05/19, #100 with not that follow up is needed.

## 2019-06-21 ENCOUNTER — Other Ambulatory Visit: Payer: Self-pay | Admitting: *Deleted

## 2019-06-21 MED ORDER — OMEPRAZOLE 20 MG PO CPDR: 20.0000 mg | DELAYED_RELEASE_CAPSULE | Freq: Two times a day (BID) | ORAL | 3 refills | Status: DC

## 2019-06-30 ENCOUNTER — Other Ambulatory Visit: Payer: Self-pay | Admitting: Cardiology

## 2019-06-30 ENCOUNTER — Ambulatory Visit (INDEPENDENT_AMBULATORY_CARE_PROVIDER_SITE_OTHER): Payer: 59 | Admitting: Psychiatry

## 2019-06-30 ENCOUNTER — Other Ambulatory Visit: Payer: Self-pay

## 2019-06-30 DIAGNOSIS — F410 Panic disorder [episodic paroxysmal anxiety] without agoraphobia: Secondary | ICD-10-CM | POA: Diagnosis not present

## 2019-06-30 DIAGNOSIS — R000 Tachycardia, unspecified: Secondary | ICD-10-CM

## 2019-06-30 DIAGNOSIS — F909 Attention-deficit hyperactivity disorder, unspecified type: Secondary | ICD-10-CM | POA: Diagnosis not present

## 2019-06-30 DIAGNOSIS — F411 Generalized anxiety disorder: Secondary | ICD-10-CM

## 2019-06-30 DIAGNOSIS — F330 Major depressive disorder, recurrent, mild: Secondary | ICD-10-CM | POA: Diagnosis not present

## 2019-06-30 DIAGNOSIS — I10 Essential (primary) hypertension: Secondary | ICD-10-CM

## 2019-06-30 DIAGNOSIS — E782 Mixed hyperlipidemia: Secondary | ICD-10-CM

## 2019-06-30 MED ORDER — AMPHETAMINE-DEXTROAMPHET ER 30 MG PO CP24: 30.0000 mg | ORAL_CAPSULE | Freq: Every day | ORAL | 0 refills | Status: DC

## 2019-06-30 MED ORDER — BUSPIRONE HCL 15 MG PO TABS: 15.0000 mg | ORAL_TABLET | Freq: Two times a day (BID) | ORAL | 0 refills | Status: AC

## 2019-06-30 MED ORDER — ZOLPIDEM TARTRATE 10 MG PO TABS: 10.0000 mg | ORAL_TABLET | Freq: Every evening | ORAL | 2 refills | Status: DC | PRN

## 2019-06-30 MED ORDER — ALPRAZOLAM 1 MG PO TABS: 1.0000 mg | ORAL_TABLET | Freq: Two times a day (BID) | ORAL | 2 refills | Status: AC | PRN

## 2019-06-30 NOTE — Progress Notes (Signed)
BH MD/PA/NP OP Progress Note  06/30/2019 11:18 AM Nicole Ball  MRN:  [id number] Interview was conducted by phone and I verified that I was speaking with the correct person using two identifiers. I discussed the limitations of evaluation and management by telemedicine and  the availability of in person appointments. Patient expressed understanding and agreed to proceed.  Chief Complaint: Anxiety.  HPI: 43 yo married AAFwith along standinghx ofdepression (on and off) and generalized anxiety. She used to have frequent panic attacks and is prescribed alprazolam 1 mg bid prn these. They are much less frequent now but they still occur occasionally. She is on buspirone and fluoxetine combination for depression/anxiety. In thepast she tried citalopram and venlafaxine but could not tolerate excessive sweating and other adverse effects which now, in perspective, she thinks might have been more related to her menopausal symptoms. 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 She has never been formally diagnosed with ADD but at one time took a colleague's Adderall and noticed major improvement in concentration.We added Adderall XR 30 mg and she noticed considerable improvement in focusing ability.Still high level of anxiety related to COVID.  Visit Diagnosis:    ICD-10-CM   1. Adult ADHD  F90.9   2. Major depressive disorder, recurrent, mild (HCC)  F33.0   3. Panic disorder without agoraphobia  F41.0   4. GAD (generalized anxiety disorder)  F41.1     Past Psychiatric History: Please see inateke H&P.  Past Medical History:  Past Medical History:  Diagnosis Date  . Abnormal serum level of alkaline phosphatase 05/21/2014  . Acute low back pain 12/19/2010  . ALLERGIC RHINITIS 10/16/2010  . Allergy    seasonal  . Anxiety  and depression 06/07/2011  . Atrophic vaginitis 08/17/2016  . Cervical cancer screening 01/10/2014   Menarche at 8 Regular and moderate flow, became irregular until OCPs  history of abnormal pap in past, repeat was normal Last pap roughly 3 years ago G0P0, s/p No history of abnormal MGM, no h/o MGM  No concerns today no gyn surgeries LMP May 2013  . CTS (carpal tunnel syndrome) 01/15/2014  . Diabetes mellitus type 2 in obese (HCC) 10/30/2011  . Dry eyes 08/17/2016  . Early menopause   . Elevated BP 04/21/2011  . Essential hypertension   . HIATAL HERNIA WITH REFLUX, HX OF 10/16/2010  . Hyperlipidemia 04/21/2011  . Inappropriate sinus tachycardia   . INSOMNIA 10/16/2010  . Knee pain, left 03/11/2012  . Migraine 03/07/2013  . Obesity   . Obesity (BMI 30-39.9) 01/12/2018  . Palpitations 01/28/2015  . Reflux 04/22/2012  . Sleep apnea 06/07/2011  . Tobacco use disorder 02/10/2017  . Urinary urgency 08/17/2016  . UTI'S, HX OF 10/16/2010    Past Surgical History:  Procedure Laterality Date  . CHOLECYSTECTOMY      Family Psychiatric History: Reviewed.  Family History:  Family History  Problem Relation Age of Onset  . Lupus Mother   . Arthritis Maternal Grandmother   . Scleroderma Maternal Grandmother   . Glaucoma Maternal Grandmother   . Cataracts Maternal Grandmother   . Cancer Maternal Grandfather        prostate  . Coronary artery disease Maternal Grandfather        s/p angioplasty  . Diabetes Maternal Grandfather        Type 2  .  Cancer Paternal Grandmother        Breast ca- dx in 65's  . Thyroid disease Paternal Grandmother   . Breast cancer Paternal Grandmother   . Diabetes Paternal Grandfather   . Thyroid disease Sister   . Asthma Sister   . Allergies Brother   . Cancer Father        cigarette, alcohol HEENT squamous cell  . Alcohol abuse Father   . Arthritis Other   . Alcohol abuse Maternal Aunt   . Alcohol abuse Maternal Uncle     Social History:  Social History   Socioeconomic  History  . Marital status: Married    Spouse name: Not on file  . Number of children: Not on file  . Years of education: Not on file  . Highest education level: Not on file  Occupational History  . Not on file  Social Needs  . Financial resource strain: Not on file  . Food insecurity    Worry: Not on file    Inability: Not on file  . Transportation needs    Medical: Not on file    Non-medical: Not on file  Tobacco Use  . Smoking status: Current Some Day Smoker    Packs/day: 0.10    Types: Cigars  . Smokeless tobacco: Never Used  . Tobacco comment: 1 black and milds  Substance and Sexual Activity  . Alcohol use: Yes    Comment: twice/year  . Drug use: No  . Sexual activity: Not on file  Lifestyle  . Physical activity    Days per week: Not on file    Minutes per session: Not on file  . Stress: Not on file  Relationships  . Social Musician on phone: Not on file    Gets together: Not on file    Attends religious service: Not on file    Active member of club or organization: Not on file    Attends meetings of clubs or organizations: Not on file    Relationship status: Not on file  Other Topics Concern  . Not on file  Social History Narrative   Married lives w/ husband, works at [employer]    Allergies: No Known Allergies  Metabolic Disorder Labs: Lab Results  Component Value Date   HGBA1C 6.9 (H) 10/14/2018   MPG 140 (H) 05/19/2014   MPG 148 (H) 10/14/2013   No results found for: PROLACTIN Lab Results  Component Value Date   CHOL 159 01/14/2018   TRIG 155.0 (H) 01/14/2018   HDL 67.60 01/14/2018   CHOLHDL 2 01/14/2018   VLDL 31.0 01/14/2018   LDLCALC 61 01/14/2018   LDLCALC 43 10/13/2017   Lab Results  Component Value Date   TSH 1.29 01/14/2018   TSH 0.86 10/13/2017    Therapeutic Level Labs: No results found for: LITHIUM No results found for: VALPROATE No components found for:  CBMZ  Current Medications: Current Outpatient  Medications  Medication Sig Dispense Refill  . ACETAMINOPHEN-BUTALBITAL 50-325 MG TABS Take 1 tablet by mouth 2 (two) times daily as needed (HEADACHES). 120 each 0  . [START ON 07/30/2019] ALPRAZolam (XANAX) 1 MG tablet Take 1 tablet (1 mg total) by mouth 2 (two) times daily as needed for anxiety. 60 tablet 2  . amphetamine-dextroamphetamine (ADDERALL XR) 30 MG 24 hr capsule Take 1 capsule (30 mg total) by mouth daily. 30 capsule 0  . azelastine (ASTELIN) 0.1 % nasal spray Place 2 sprays into both nostrils 2 (two)  times daily. Use in each nostril as directed 30 mL 12  . azithromycin (ZITHROMAX) 250 MG tablet Take 2 tablets by mouth on day 1, followed by 1 tablet by mouth daily for 4 days. 6 tablet 0  . BAYER MICROLET LANCETS lancets Use as directed once daily to check blood sugar.   DX E11.9 100 each 1  . Blood Glucose Monitoring Suppl (CONTOUR BLOOD GLUCOSE SYSTEM) DEVI Use daily to check blood sugar. DX E11.9 1 Device 0  . [START ON 07/30/2019] busPIRone (BUSPAR) 15 MG tablet Take 1 tablet (15 mg total) by mouth 2 (two) times daily. 180 tablet 0  . conjugated estrogens (PREMARIN) vaginal cream Place 1 Applicatorful vaginally once a week. 42.5 g 121  . CONTOUR NEXT TEST test strip USE TO CHECK BLOOD SUGAR DAILY 100 each 2  . cyclobenzaprine (FLEXERIL) 10 MG tablet Take 1 tablet (10 mg total) by mouth at bedtime. 60 tablet 2  . famotidine (PEPCID) 20 MG tablet Take 1 tablet (20 mg total) by mouth daily. 90 tablet 1  . fluconazole (DIFLUCAN) 150 MG tablet Take 1 tablet (150 mg total) by mouth as needed. 1 tablet 0  . FLUoxetine (PROZAC) 20 MG capsule Take 1 capsule (20 mg total) by mouth 3 (three) times daily. 90 capsule 5  . fluticasone (FLONASE) 50 MCG/ACT nasal spray Place 2 sprays into both nostrils daily. 16 g 6  . fluticasone (FLONASE) 50 MCG/ACT nasal spray Place 2 sprays into both nostrils daily. 16 g 1  . gabapentin (NEURONTIN) 100 MG capsule TAKE 1 CAPSULE(100 MG) BY MOUTH AT BEDTIME 30  capsule 0  . gabapentin (NEURONTIN) 300 MG capsule Take 300 mg by mouth 2 (two) times daily.    Marland Kitchen HYDROcodone-acetaminophen (NORCO/VICODIN) 5-325 MG tablet Take 1 tablet by mouth every 6 (six) hours as needed. Patient needs appointment for further refills. 100 tablet 0  . HYDROcodone-acetaminophen (NORCO/VICODIN) 5-325 MG tablet Take 1 tablet by mouth every 6 (six) hours as needed. Patient needs appointment for further refills. 100 tablet 0  . hyoscyamine (LEVSIN SL) 0.125 MG SL tablet Take 1 tablet (0.125 mg total) by mouth every 4 (four) hours as needed. 30 tablet 0  . levocetirizine (XYZAL) 5 MG tablet Take 1 tablet (5 mg total) by mouth every evening. 30 tablet 3  . losartan (COZAAR) 50 MG tablet Take 1 tablet (50 mg total) by mouth daily. 90 tablet 2  . meloxicam (MOBIC) 15 MG tablet Take 1 tablet (15 mg total) by mouth daily as needed for pain. 90 tablet 1  . metFORMIN (GLUCOPHAGE) 500 MG tablet TAKE 2 TABLETS BY MOUTH TWICE DAILY 360 tablet 1  . montelukast (SINGULAIR) 10 MG tablet TAKE 1 TABLET(10 MG) BY MOUTH AT BEDTIME 90 tablet 1  . omeprazole (PRILOSEC) 20 MG capsule Take 1 capsule (20 mg total) by mouth 2 (two) times daily before a meal. 180 capsule 3  . promethazine (PHENERGAN) 25 MG tablet Take 1 tablet (25 mg total) by mouth every 8 (eight) hours as needed for nausea or vomiting. Refill when current expires. 20 tablet 0  . propranolol (INDERAL) 40 MG tablet Take 1 tablet (40 mg total) by mouth 2 (two) times daily. 60 tablet 11  . rosuvastatin (CRESTOR) 5 MG tablet Take 1 tablet (5 mg total) by mouth 3 (three) times a week. 45 tablet 3  . sodium chloride (OCEAN) 0.65 % SOLN nasal spray Place 1 spray into both nostrils daily.    . sucralfate (CARAFATE) 1 g tablet  1 tab po tid 90 tablet 0  . SUMAtriptan (IMITREX) 50 MG tablet Take 1 tablet (50 mg total) by mouth every 2 (two) hours as needed for migraine. May repeat in 2 hours if headache persists or recurs. 10 tablet 3  . [START ON  07/30/2019] zolpidem (AMBIEN) 10 MG tablet Take 1 tablet (10 mg total) by mouth at bedtime as needed for sleep. 30 tablet 2   No current facility-administered medications for this visit.       Psychiatric Specialty Exam: Review of Systems  Constitutional: Positive for malaise/fatigue.  Psychiatric/Behavioral: The patient is nervous/anxious.   All other systems reviewed and are negative.   Last menstrual period 01/08/2013.There is no height or weight on file to calculate BMI.  General Appearance: NA  Eye Contact:  NA  Speech:  Clear and Coherent and Normal Rate  Volume:  Normal  Mood:  Anxious  Affect:  NA  Thought Process:  Goal Directed and Linear  Orientation:  Full (Time, Place, and Person)  Thought Content: Logical   Suicidal Thoughts:  No  Homicidal Thoughts:  No  Memory:  Immediate;   Good Recent;   Good Remote;   Good  Judgement:  Good  Insight:  Good  Psychomotor Activity:  NA  Concentration:  Concentration: Good  Recall:  Good  Fund of Knowledge: Good  Language: Good  Akathisia:  Negative  Handed:  Right  AIMS (if indicated): not done  Assets:  Communication Skills Desire for Improvement Financial Resources/Insurance Housing Intimacy Social Support Talents/Skills  ADL's:  Intact  Cognition: WNL  Sleep:  Fair  Assessment and Plan: 43 yo married AAFwith along standinghx ofdepression (on and off) and generalized anxiety. She used to have frequent panic attacks and is prescribed alprazolam 1 mg bid prn these. They are much less frequent now but they still occur occasionally. She is on buspirone and fluoxetine combination for depression/anxiety. In thepast she tried citalopram and venlafaxine but could not tolerate excessive sweating and other adverse effects which now, in perspective, she thinks might have been more related to her menopausal symptoms. 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 She has never been formally diagnosed with ADD but at one time took a colleague's Adderall and noticed major improvement in concentration.We added Adderall XR 30 mg and she noticed considerable improvement in focusing ability.Still high level of anxiety related to COVID.  Dx: GAD; Panic disorder; ADD adult type; MDD recurrent, mild  Plan: Continue Prozac 60 mg daily, Buspar 15 mg bid, zolpidem 10 mg prn sleep, alprazolamto1 mgbidprn panic attacks and Adderall XRto 30mg  daily.Next appointment in two months.The plan was discussed with patient who had an opportunity to ask questions and these were all answered. I spend40minutes inphone consultationwith the patient    Stephanie Acre, MD 06/30/2019, 11:18 AM

## 2019-08-17 ENCOUNTER — Other Ambulatory Visit: Payer: Self-pay | Admitting: Family Medicine

## 2019-08-17 DIAGNOSIS — M25562 Pain in left knee: Secondary | ICD-10-CM

## 2019-08-17 MED ORDER — HYDROCODONE-ACETAMINOPHEN 5-325 MG PO TABS: 1.0000 | ORAL_TABLET | Freq: Four times a day (QID) | ORAL | 0 refills | Status: DC | PRN

## 2019-08-17 NOTE — Telephone Encounter (Signed)
Last written: 06/06/19 Last ov:04/28/19 Next ov: none Contract: due UDS: due

## 2019-08-18 ENCOUNTER — Other Ambulatory Visit: Payer: Self-pay | Admitting: Family Medicine

## 2019-08-22 ENCOUNTER — Telehealth: Payer: Self-pay | Admitting: Cardiology

## 2019-08-22 NOTE — Telephone Encounter (Signed)
  Patient is calling because she states she had a stress test scheduled at one point but had to cancel it. She is now ready to see if she can get it done. There are no orders for a stress test in the system. Patient last saw Dr Delton See 02/2019

## 2019-08-22 NOTE — Telephone Encounter (Signed)
Left the pt a message to call the office back for further assistance.

## 2019-08-23 ENCOUNTER — Ambulatory Visit (INDEPENDENT_AMBULATORY_CARE_PROVIDER_SITE_OTHER): Payer: 59 | Admitting: Family Medicine

## 2019-08-23 ENCOUNTER — Other Ambulatory Visit: Payer: Self-pay

## 2019-08-23 DIAGNOSIS — E1169 Type 2 diabetes mellitus with other specified complication: Secondary | ICD-10-CM | POA: Diagnosis not present

## 2019-08-23 DIAGNOSIS — E669 Obesity, unspecified: Secondary | ICD-10-CM | POA: Diagnosis not present

## 2019-08-23 DIAGNOSIS — M549 Dorsalgia, unspecified: Secondary | ICD-10-CM

## 2019-08-23 DIAGNOSIS — I10 Essential (primary) hypertension: Secondary | ICD-10-CM

## 2019-08-23 DIAGNOSIS — J069 Acute upper respiratory infection, unspecified: Secondary | ICD-10-CM

## 2019-08-23 MED ORDER — PREMARIN 0.625 MG/GM VA CREA: TOPICAL_CREAM | VAGINAL | 1 refills | Status: DC

## 2019-08-23 MED ORDER — FAMOTIDINE 40 MG PO TABS: 40.0000 mg | ORAL_TABLET | Freq: Every day | ORAL | 5 refills | Status: DC

## 2019-08-24 DIAGNOSIS — J069 Acute upper respiratory infection, unspecified: Secondary | ICD-10-CM | POA: Insufficient documentation

## 2019-08-24 NOTE — Assessment & Plan Note (Signed)
Encouraged to proceed with covid testing

## 2019-08-24 NOTE — Progress Notes (Signed)
Virtual Visit via phone Note  I connected with Nicole Ball on 1/12/21at 10:40 AM EST by a phone enabled telemedicine application and verified that I am speaking with the correct person using two identifiers.  Location: Patient: home Provider: home   I discussed the limitations of evaluation and management by telemedicine and the availability of in person appointments. The patient expressed understanding and agreed to proceed. Crissie Sickles, CMA was able to get patient set up on visit, phone after being unable to set up a video visit   Subjective:    Patient ID: Nicole Ball, female    DOB: 05/12/1976, 44 y.o.   MRN: [id number]  No chief complaint on file.   HPI Patient is in today for follow up on chronic medical concerns. No recent febrile illness or hospitalizations. She is noting worsening dyspepsia despite trying to avoid offending foods. Is also noting weeks worth of watery, itchy eyes, sore throat, headache fatigue ear pain and nausea. No CP/SOBpalp. Notes increased joint pain and back pain as well  Past Medical History:  Diagnosis Date  . Abnormal serum level of alkaline phosphatase 05/21/2014  . Acute low back pain 12/19/2010  . ALLERGIC RHINITIS 10/16/2010  . Allergy    seasonal  . Anxiety and depression 06/07/2011  . Atrophic vaginitis 08/17/2016  . Cervical cancer screening 01/10/2014   Menarche at 8 Regular and moderate flow, became irregular until OCPs  history of abnormal pap in past, repeat was normal Last pap roughly 3 years ago G0P0, s/p No history of abnormal MGM, no h/o MGM  No concerns today no gyn surgeries LMP May 2013  . CTS (carpal tunnel syndrome) 01/15/2014  . Diabetes mellitus type 2 in obese ([facility]) 10/30/2011  . Dry eyes 08/17/2016  . Early menopause   . Elevated BP 04/21/2011  . Essential hypertension   . HIATAL HERNIA WITH REFLUX, HX OF 10/16/2010  . Hyperlipidemia 04/21/2011  . Inappropriate sinus tachycardia   . INSOMNIA 10/16/2010  . Knee pain, left  03/11/2012  . Migraine 03/07/2013  . Obesity   . Obesity (BMI 30-39.9) 01/12/2018  . Palpitations 01/28/2015  . Reflux 04/22/2012  . Sleep apnea 06/07/2011  . Tobacco use disorder 02/10/2017  . Urinary urgency 08/17/2016  . UTI'S, HX OF 10/16/2010    Past Surgical History:  Procedure Laterality Date  . CHOLECYSTECTOMY      Family History  Problem Relation Age of Onset  . Lupus Mother   . Arthritis Maternal Grandmother   . Scleroderma Maternal Grandmother   . Glaucoma Maternal Grandmother   . Cataracts Maternal Grandmother   . Cancer Maternal Grandfather        prostate  . Coronary artery disease Maternal Grandfather        s/p angioplasty  . Diabetes Maternal Grandfather        Type 2  . Cancer Paternal Grandmother        Breast ca- dx in 86's  . Thyroid disease Paternal Grandmother   . Breast cancer Paternal Grandmother   . Diabetes Paternal Grandfather   . Thyroid disease Sister   . Asthma Sister   . Allergies Brother   . Cancer Father        cigarette, alcohol HEENT squamous cell  . Alcohol abuse Father   . Arthritis Other   . Alcohol abuse Maternal Aunt   . Alcohol abuse Maternal Uncle     Social History   Socioeconomic History  . Marital status: Married  Spouse name: Not on file  . Number of children: Not on file  . Years of education: Not on file  . Highest education level: Not on file  Occupational History  . Not on file  Tobacco Use  . Smoking status: Current Some Day Smoker    Packs/day: 0.10    Types: Cigars  . Smokeless tobacco: Never Used  . Tobacco comment: 1 black and milds  Substance and Sexual Activity  . Alcohol use: Yes    Comment: twice/year  . Drug use: No  . Sexual activity: Not on file  Other Topics Concern  . Not on file  Social History Narrative   Married lives w/ husband, works at [employer]   Social Determinants of Corporate investment banker Strain:   . Difficulty of Paying Living Expenses: Not on file  Food Insecurity:     . Worried About Programme researcher, broadcasting/film/video in the Last Year: Not on file  . Ran Out of Food in the Last Year: Not on file  Transportation Needs:   . Lack of Transportation (Medical): Not on file  . Lack of Transportation (Non-Medical): Not on file  Physical Activity:   . Days of Exercise per Week: Not on file  . Minutes of Exercise per Session: Not on file  Stress:   . Feeling of Stress : Not on file  Social Connections:   . Frequency of Communication with Friends and Family: Not on file  . Frequency of Social Gatherings with Friends and Family: Not on file  . Attends Religious Services: Not on file  . Active Member of Clubs or Organizations: Not on file  . Attends Banker Meetings: Not on file  . Marital Status: Not on file  Intimate Partner Violence:   . Fear of Current or Ex-Partner: Not on file  . Emotionally Abused: Not on file  . Physically Abused: Not on file  . Sexually Abused: Not on file    Outpatient Medications Prior to Visit  Medication Sig Dispense Refill  . ACETAMINOPHEN-BUTALBITAL 50-325 MG TABS Take 1 tablet by mouth 2 (two) times daily as needed (HEADACHES). 120 each 0  . ALPRAZolam (XANAX) 1 MG tablet Take 1 tablet (1 mg total) by mouth 2 (two) times daily as needed for anxiety. 60 tablet 2  . amphetamine-dextroamphetamine (ADDERALL XR) 30 MG 24 hr capsule Take 1 capsule (30 mg total) by mouth daily. 30 capsule 0  . azelastine (ASTELIN) 0.1 % nasal spray Place 2 sprays into both nostrils 2 (two) times daily. Use in each nostril as directed 30 mL 12  . BAYER MICROLET LANCETS lancets Use as directed once daily to check blood sugar.   DX E11.9 100 each 1  . Blood Glucose Monitoring Suppl (CONTOUR BLOOD GLUCOSE SYSTEM) DEVI Use daily to check blood sugar. DX E11.9 1 Device 0  . busPIRone (BUSPAR) 15 MG tablet Take 1 tablet (15 mg total) by mouth 2 (two) times daily. 180 tablet 0  . conjugated estrogens (PREMARIN) vaginal cream Place 1 Applicatorful vaginally  once a week. 42.5 g 121  . CONTOUR NEXT TEST test strip USE TO CHECK BLOOD SUGAR DAILY 100 each 2  . cyclobenzaprine (FLEXERIL) 10 MG tablet Take 1 tablet (10 mg total) by mouth at bedtime. 60 tablet 2  . fluconazole (DIFLUCAN) 150 MG tablet Take 1 tablet (150 mg total) by mouth as needed. 1 tablet 0  . FLUoxetine (PROZAC) 20 MG capsule Take 1 capsule (20 mg total) by  mouth 3 (three) times daily. 90 capsule 5  . fluticasone (FLONASE) 50 MCG/ACT nasal spray Place 2 sprays into both nostrils daily. 16 g 6  . fluticasone (FLONASE) 50 MCG/ACT nasal spray Place 2 sprays into both nostrils daily. 16 g 1  . gabapentin (NEURONTIN) 100 MG capsule TAKE 1 CAPSULE(100 MG) BY MOUTH AT BEDTIME 30 capsule 0  . gabapentin (NEURONTIN) 300 MG capsule TAKE 1 CAPSULE BY MOUTH TWICE DAILY AND 2 CAPSULES BY MOUTH AT BEDTIME 120 capsule 1  . HYDROcodone-acetaminophen (NORCO/VICODIN) 5-325 MG tablet Take 1 tablet by mouth every 6 (six) hours as needed. Patient needs appointment for further refills. 100 tablet 0  . hyoscyamine (LEVSIN SL) 0.125 MG SL tablet Take 1 tablet (0.125 mg total) by mouth every 4 (four) hours as needed. 30 tablet 0  . levocetirizine (XYZAL) 5 MG tablet Take 1 tablet (5 mg total) by mouth every evening. 30 tablet 3  . losartan (COZAAR) 50 MG tablet TAKE 1 TABLET(50 MG) BY MOUTH DAILY 90 tablet 2  . meloxicam (MOBIC) 15 MG tablet Take 1 tablet (15 mg total) by mouth daily as needed for pain. 90 tablet 1  . metFORMIN (GLUCOPHAGE) 500 MG tablet TAKE 2 TABLETS BY MOUTH TWICE DAILY 360 tablet 1  . montelukast (SINGULAIR) 10 MG tablet TAKE 1 TABLET(10 MG) BY MOUTH AT BEDTIME 90 tablet 1  . omeprazole (PRILOSEC) 20 MG capsule Take 1 capsule (20 mg total) by mouth 2 (two) times daily before a meal. 180 capsule 3  . promethazine (PHENERGAN) 25 MG tablet Take 1 tablet (25 mg total) by mouth every 8 (eight) hours as needed for nausea or vomiting. Refill when current expires. 20 tablet 0  . propranolol  (INDERAL) 40 MG tablet Take 1 tablet (40 mg total) by mouth 2 (two) times daily. 60 tablet 11  . rosuvastatin (CRESTOR) 5 MG tablet Take 1 tablet (5 mg total) by mouth 3 (three) times a week. 45 tablet 3  . sodium chloride (OCEAN) 0.65 % SOLN nasal spray Place 1 spray into both nostrils daily.    . sucralfate (CARAFATE) 1 g tablet 1 tab po tid 90 tablet 0  . SUMAtriptan (IMITREX) 50 MG tablet Take 1 tablet (50 mg total) by mouth every 2 (two) hours as needed for migraine. May repeat in 2 hours if headache persists or recurs. 10 tablet 3  . zolpidem (AMBIEN) 10 MG tablet Take 1 tablet (10 mg total) by mouth at bedtime as needed for sleep. 30 tablet 2  . azithromycin (ZITHROMAX) 250 MG tablet Take 2 tablets by mouth on day 1, followed by 1 tablet by mouth daily for 4 days. 6 tablet 0  . famotidine (PEPCID) 20 MG tablet Take 1 tablet (20 mg total) by mouth daily. 90 tablet 1  . HYDROcodone-acetaminophen (NORCO/VICODIN) 5-325 MG tablet Take 1 tablet by mouth every 6 (six) hours as needed. Patient needs appointment for further refills. 100 tablet 0   No facility-administered medications prior to visit.    No Known Allergies  Review of Systems  Constitutional: Positive for malaise/fatigue. Negative for fever.  HENT: Positive for congestion, ear pain and sore throat.   Eyes: Positive for discharge. Negative for blurred vision.  Respiratory: Negative for shortness of breath.   Cardiovascular: Negative for chest pain, palpitations and leg swelling.  Gastrointestinal: Positive for nausea. Negative for abdominal pain, blood in stool and vomiting.  Genitourinary: Negative for dysuria and frequency.  Musculoskeletal: Positive for back pain and joint pain. Negative for  falls.  Skin: Negative for rash.  Neurological: Positive for headaches. Negative for dizziness and loss of consciousness.  Endo/Heme/Allergies: Negative for environmental allergies.  Psychiatric/Behavioral: Negative for depression. The  patient is not nervous/anxious.        Objective:    Physical Exam unable to obtain via phone  LMP 01/08/2013  Wt Readings from Last 3 Encounters:  04/28/19 196 lb (88.9 kg)  03/04/19 204 lb (92.5 kg)  12/09/18 195 lb (88.5 kg)    Diabetic Foot Exam - Simple   No data filed     Lab Results  Component Value Date   WBC 7.2 01/14/2018   HGB 13.2 01/14/2018   HCT 41.0 01/14/2018   PLT 200.0 01/14/2018   GLUCOSE 93 01/14/2018   CHOL 159 01/14/2018   TRIG 155.0 (H) 01/14/2018   HDL 67.60 01/14/2018   LDLCALC 61 01/14/2018   ALT 16 01/14/2018   AST 11 01/14/2018   NA 137 01/14/2018   K 3.9 01/14/2018   CL 102 01/14/2018   CREATININE 0.68 01/14/2018   BUN 11 01/14/2018   CO2 22 01/14/2018   TSH 1.29 01/14/2018   HGBA1C 6.9 (H) 10/14/2018   MICROALBUR <0.7 09/17/2015    Lab Results  Component Value Date   TSH 1.29 01/14/2018   Lab Results  Component Value Date   WBC 7.2 01/14/2018   HGB 13.2 01/14/2018   HCT 41.0 01/14/2018   MCV 88.8 01/14/2018   PLT 200.0 01/14/2018   Lab Results  Component Value Date   NA 137 01/14/2018   K 3.9 01/14/2018   CO2 22 01/14/2018   GLUCOSE 93 01/14/2018   BUN 11 01/14/2018   CREATININE 0.68 01/14/2018   BILITOT 0.2 01/14/2018   ALKPHOS 143 (H) 01/14/2018   AST 11 01/14/2018   ALT 16 01/14/2018   PROT 7.4 01/14/2018   ALBUMIN 4.3 01/14/2018   CALCIUM 10.1 01/14/2018   GFR 121.09 01/14/2018   Lab Results  Component Value Date   CHOL 159 01/14/2018   Lab Results  Component Value Date   HDL 67.60 01/14/2018   Lab Results  Component Value Date   LDLCALC 61 01/14/2018   Lab Results  Component Value Date   TRIG 155.0 (H) 01/14/2018   Lab Results  Component Value Date   CHOLHDL 2 01/14/2018   Lab Results  Component Value Date   HGBA1C 6.9 (H) 10/14/2018       Assessment & Plan:   Problem List Items Addressed This Visit    Diabetes mellitus type 2 in obese ([facility])    hgba1c acceptable, minimize simple  carbs. Increase exercise as tolerated. Continue current meds      HTN (hypertension)    Monitor and report any concerns. no changes to meds. Encouraged heart healthy diet such as the DASH diet and exercise as tolerated.       Obesity (BMI 30-39.9)    Encouraged DASH diet, decrease po intake and increase exercise as tolerated. Needs 7-8 hours of sleep nightly. Avoid trans fats, eat small, frequent meals every 4-5 hours with lean proteins, complex carbs and healthy fats. Minimize simple carbs      Back pain    Encouraged moist heat and gentle stretching as tolerated. May try NSAIDs and prescription meds as directed and report if symptoms worsen or seek immediate care      URI (upper respiratory infection)    Encouraged to proceed with covid testing          I  have discontinued Janijah Z. Georg's famotidine and azithromycin. I am also having her start on Premarin and famotidine. Additionally, I am having her maintain her Bayer Microlet Lancets, Contour Next Test, ACETAMINOPHEN-BUTALBITAL, CONTOUR BLOOD GLUCOSE SYSTEM, conjugated estrogens, meloxicam, fluticasone, azelastine, propranolol, sodium chloride, rosuvastatin, gabapentin, metFORMIN, FLUoxetine, SUMAtriptan, sucralfate, fluticasone, levocetirizine, fluconazole, cyclobenzaprine, hyoscyamine, promethazine, montelukast, omeprazole, losartan, amphetamine-dextroamphetamine, ALPRAZolam, busPIRone, zolpidem, HYDROcodone-acetaminophen, and gabapentin.  Meds ordered this encounter  Medications  . conjugated estrogens (PREMARIN) vaginal cream    Sig: Apply small amount to affected area twice a week    Dispense:  42.5 g    Refill:  1  . famotidine (PEPCID) 40 MG tablet    Sig: Take 1 tablet (40 mg total) by mouth at bedtime.    Dispense:  30 tablet    Refill:  5    I discussed the assessment and treatment plan with the patient. The patient was provided an opportunity to ask questions and all were answered. The patient agreed with the  plan and demonstrated an understanding of the instructions.   The patient was advised to call back or seek an in-person evaluation if the symptoms worsen or if the condition fails to improve as anticipated.  I provided 25 minutes of non-face-to-face time during this encounter.   Danise Edge, MD

## 2019-08-24 NOTE — Assessment & Plan Note (Signed)
Encouraged moist heat and gentle stretching as tolerated. May try NSAIDs and prescription meds as directed and report if symptoms worsen or seek immediate care 

## 2019-08-24 NOTE — Assessment & Plan Note (Signed)
hgba1c acceptable, minimize simple carbs. Increase exercise as tolerated. Continue current meds 

## 2019-08-24 NOTE — Assessment & Plan Note (Signed)
Monitor and report any concerns. no changes to meds. Encouraged heart healthy diet such as the DASH diet and exercise as tolerated.  

## 2019-08-24 NOTE — Assessment & Plan Note (Signed)
Encouraged DASH diet, decrease po intake and increase exercise as tolerated. Needs 7-8 hours of sleep nightly. Avoid trans fats, eat small, frequent meals every 4-5 hours with lean proteins, complex carbs and healthy fats. Minimize simple carbs 

## 2019-08-25 ENCOUNTER — Ambulatory Visit (INDEPENDENT_AMBULATORY_CARE_PROVIDER_SITE_OTHER): Payer: 59 | Admitting: Psychiatry

## 2019-08-25 ENCOUNTER — Ambulatory Visit: Payer: 59 | Attending: Internal Medicine

## 2019-08-25 ENCOUNTER — Other Ambulatory Visit: Payer: Self-pay

## 2019-08-25 DIAGNOSIS — Z20822 Contact with and (suspected) exposure to covid-19: Secondary | ICD-10-CM

## 2019-08-25 DIAGNOSIS — F988 Other specified behavioral and emotional disorders with onset usually occurring in childhood and adolescence: Secondary | ICD-10-CM

## 2019-08-25 DIAGNOSIS — F411 Generalized anxiety disorder: Secondary | ICD-10-CM

## 2019-08-25 DIAGNOSIS — F410 Panic disorder [episodic paroxysmal anxiety] without agoraphobia: Secondary | ICD-10-CM | POA: Diagnosis not present

## 2019-08-25 DIAGNOSIS — F330 Major depressive disorder, recurrent, mild: Secondary | ICD-10-CM

## 2019-08-25 DIAGNOSIS — F909 Attention-deficit hyperactivity disorder, unspecified type: Secondary | ICD-10-CM

## 2019-08-25 MED ORDER — FLUOXETINE HCL 20 MG PO CAPS: 20.0000 mg | ORAL_CAPSULE | Freq: Three times a day (TID) | ORAL | 5 refills | Status: DC

## 2019-08-25 MED ORDER — AMPHETAMINE-DEXTROAMPHET ER 30 MG PO CP24: 30.0000 mg | ORAL_CAPSULE | Freq: Every day | ORAL | 0 refills | Status: DC

## 2019-08-25 NOTE — Progress Notes (Signed)
BH MD/PA/NP OP Progress Note  08/25/2019 11:17 AM Aleria TWANISHA FOULK  MRN:  [id number] Interview was conducted by phone and I verified that I was speaking with the correct person using two identifiers. I discussed the limitations of evaluation and management by telemedicine and  the availability of in person appointments. Patient expressed understanding and agreed to proceed.  Chief Complaint: Some anxiety.  HPI: 44 yo married AAFwith along standinghx ofdepression (on and off) and generalized anxiety. She used to have frequent panic attacks and is prescribed alprazolam 1 mg bid prn these. They are much less frequent now but they still occur occasionally. She is on buspirone and fluoxetine combination for depression/anxiety. In thepast she tried citalopram and venlafaxine but could not tolerate excessive sweating and other adverse effects which now, in perspective, she thinks might have been more related to her menopausal symptoms. 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 She has never been formally diagnosed with ADD. We added Adderall XR 30 mg and she noticed considerable improvement in focusing ability, less fatigue and less anxiety as well.Anxiety related to COVID situation however continues.  Visit Diagnosis:    ICD-10-CM   1. Major depressive disorder, recurrent, mild (HCC)  F33.0   2. GAD (generalized anxiety disorder)  F41.1   3. Adult ADHD  F90.9   4. Panic disorder without agoraphobia  F41.0     Past Psychiatric History: Please see intake H&P.  Past Medical History:  Past Medical History:  Diagnosis Date  . Abnormal serum level of alkaline phosphatase 05/21/2014  . Acute low back pain 12/19/2010  . ALLERGIC RHINITIS 10/16/2010  . Allergy    seasonal  . Anxiety and depression 06/07/2011  . Atrophic  vaginitis 08/17/2016  . Cervical cancer screening 01/10/2014   Menarche at 8 Regular and moderate flow, became irregular until OCPs  history of abnormal pap in past, repeat was normal Last pap roughly 3 years ago G0P0, s/p No history of abnormal MGM, no h/o MGM  No concerns today no gyn surgeries LMP May 2013  . CTS (carpal tunnel syndrome) 01/15/2014  . Diabetes mellitus type 2 in obese (HCC) 10/30/2011  . Dry eyes 08/17/2016  . Early menopause   . Elevated BP 04/21/2011  . Essential hypertension   . HIATAL HERNIA WITH REFLUX, HX OF 10/16/2010  . Hyperlipidemia 04/21/2011  . Inappropriate sinus tachycardia   . INSOMNIA 10/16/2010  . Knee pain, left 03/11/2012  . Migraine 03/07/2013  . Obesity   . Obesity (BMI 30-39.9) 01/12/2018  . Palpitations 01/28/2015  . Reflux 04/22/2012  . Sleep apnea 06/07/2011  . Tobacco use disorder 02/10/2017  . Urinary urgency 08/17/2016  . UTI'S, HX OF 10/16/2010    Past Surgical History:  Procedure Laterality Date  . CHOLECYSTECTOMY      Family Psychiatric History: Reviewed.  Family History:  Family History  Problem Relation Age of Onset  . Lupus Mother   . Arthritis Maternal Grandmother   . Scleroderma Maternal Grandmother   . Glaucoma Maternal Grandmother   . Cataracts Maternal Grandmother   . Cancer Maternal Grandfather        prostate  . Coronary artery disease Maternal Grandfather        s/p angioplasty  . Diabetes Maternal Grandfather        Type 2  . Cancer Paternal Grandmother  Breast ca- dx in 50's  . Thyroid disease Paternal Grandmother   . Breast cancer Paternal Grandmother   . Diabetes Paternal Grandfather   . Thyroid disease Sister   . Asthma Sister   . Allergies Brother   . Cancer Father        cigarette, alcohol HEENT squamous cell  . Alcohol abuse Father   . Arthritis Other   . Alcohol abuse Maternal Aunt   . Alcohol abuse Maternal Uncle     Social History:  Social History   Socioeconomic History  . Marital status: Married     Spouse name: Not on file  . Number of children: Not on file  . Years of education: Not on file  . Highest education level: Not on file  Occupational History  . Not on file  Tobacco Use  . Smoking status: Current Some Day Smoker    Packs/day: 0.10    Types: Cigars  . Smokeless tobacco: Never Used  . Tobacco comment: 1 black and milds  Substance and Sexual Activity  . Alcohol use: Yes    Comment: twice/year  . Drug use: No  . Sexual activity: Not on file  Other Topics Concern  . Not on file  Social History Narrative   Married lives w/ husband, works at [employer]   Social Determinants of Corporate investment banker Strain:   . Difficulty of Paying Living Expenses: Not on file  Food Insecurity:   . Worried About Programme researcher, broadcasting/film/video in the Last Year: Not on file  . Ran Out of Food in the Last Year: Not on file  Transportation Needs:   . Lack of Transportation (Medical): Not on file  . Lack of Transportation (Non-Medical): Not on file  Physical Activity:   . Days of Exercise per Week: Not on file  . Minutes of Exercise per Session: Not on file  Stress:   . Feeling of Stress : Not on file  Social Connections:   . Frequency of Communication with Friends and Family: Not on file  . Frequency of Social Gatherings with Friends and Family: Not on file  . Attends Religious Services: Not on file  . Active Member of Clubs or Organizations: Not on file  . Attends Banker Meetings: Not on file  . Marital Status: Not on file    Allergies: No Known Allergies  Metabolic Disorder Labs: Lab Results  Component Value Date   HGBA1C 6.9 (H) 10/14/2018   MPG 140 (H) 05/19/2014   MPG 148 (H) 10/14/2013   No results found for: PROLACTIN Lab Results  Component Value Date   CHOL 159 01/14/2018   TRIG 155.0 (H) 01/14/2018   HDL 67.60 01/14/2018   CHOLHDL 2 01/14/2018   VLDL 31.0 01/14/2018   LDLCALC 61 01/14/2018   LDLCALC 43 10/13/2017   Lab Results  Component  Value Date   TSH 1.29 01/14/2018   TSH 0.86 10/13/2017    Therapeutic Level Labs: No results found for: LITHIUM No results found for: VALPROATE No components found for:  CBMZ  Current Medications: Current Outpatient Medications  Medication Sig Dispense Refill  . ACETAMINOPHEN-BUTALBITAL 50-325 MG TABS Take 1 tablet by mouth 2 (two) times daily as needed (HEADACHES). 120 each 0  . ALPRAZolam (XANAX) 1 MG tablet Take 1 tablet (1 mg total) by mouth 2 (two) times daily as needed for anxiety. 60 tablet 2  . amphetamine-dextroamphetamine (ADDERALL XR) 30 MG 24 hr capsule Take 1 capsule (30 mg  total) by mouth daily. 30 capsule 0  . azelastine (ASTELIN) 0.1 % nasal spray Place 2 sprays into both nostrils 2 (two) times daily. Use in each nostril as directed 30 mL 12  . BAYER MICROLET LANCETS lancets Use as directed once daily to check blood sugar.   DX E11.9 100 each 1  . Blood Glucose Monitoring Suppl (CONTOUR BLOOD GLUCOSE SYSTEM) DEVI Use daily to check blood sugar. DX E11.9 1 Device 0  . busPIRone (BUSPAR) 15 MG tablet Take 1 tablet (15 mg total) by mouth 2 (two) times daily. 180 tablet 0  . conjugated estrogens (PREMARIN) vaginal cream Place 1 Applicatorful vaginally once a week. 42.5 g 121  . conjugated estrogens (PREMARIN) vaginal cream Apply small amount to affected area twice a week 42.5 g 1  . CONTOUR NEXT TEST test strip USE TO CHECK BLOOD SUGAR DAILY 100 each 2  . cyclobenzaprine (FLEXERIL) 10 MG tablet Take 1 tablet (10 mg total) by mouth at bedtime. 60 tablet 2  . famotidine (PEPCID) 40 MG tablet Take 1 tablet (40 mg total) by mouth at bedtime. 30 tablet 5  . fluconazole (DIFLUCAN) 150 MG tablet Take 1 tablet (150 mg total) by mouth as needed. 1 tablet 0  . FLUoxetine (PROZAC) 20 MG capsule Take 1 capsule (20 mg total) by mouth 3 (three) times daily. 90 capsule 5  . fluticasone (FLONASE) 50 MCG/ACT nasal spray Place 2 sprays into both nostrils daily. 16 g 6  . fluticasone (FLONASE)  50 MCG/ACT nasal spray Place 2 sprays into both nostrils daily. 16 g 1  . gabapentin (NEURONTIN) 100 MG capsule TAKE 1 CAPSULE(100 MG) BY MOUTH AT BEDTIME 30 capsule 0  . gabapentin (NEURONTIN) 300 MG capsule TAKE 1 CAPSULE BY MOUTH TWICE DAILY AND 2 CAPSULES BY MOUTH AT BEDTIME 120 capsule 1  . HYDROcodone-acetaminophen (NORCO/VICODIN) 5-325 MG tablet Take 1 tablet by mouth every 6 (six) hours as needed. Patient needs appointment for further refills. 100 tablet 0  . hyoscyamine (LEVSIN SL) 0.125 MG SL tablet Take 1 tablet (0.125 mg total) by mouth every 4 (four) hours as needed. 30 tablet 0  . levocetirizine (XYZAL) 5 MG tablet Take 1 tablet (5 mg total) by mouth every evening. 30 tablet 3  . losartan (COZAAR) 50 MG tablet TAKE 1 TABLET(50 MG) BY MOUTH DAILY 90 tablet 2  . meloxicam (MOBIC) 15 MG tablet Take 1 tablet (15 mg total) by mouth daily as needed for pain. 90 tablet 1  . metFORMIN (GLUCOPHAGE) 500 MG tablet TAKE 2 TABLETS BY MOUTH TWICE DAILY 360 tablet 1  . montelukast (SINGULAIR) 10 MG tablet TAKE 1 TABLET(10 MG) BY MOUTH AT BEDTIME 90 tablet 1  . omeprazole (PRILOSEC) 20 MG capsule Take 1 capsule (20 mg total) by mouth 2 (two) times daily before a meal. 180 capsule 3  . promethazine (PHENERGAN) 25 MG tablet Take 1 tablet (25 mg total) by mouth every 8 (eight) hours as needed for nausea or vomiting. Refill when current expires. 20 tablet 0  . propranolol (INDERAL) 40 MG tablet Take 1 tablet (40 mg total) by mouth 2 (two) times daily. 60 tablet 11  . rosuvastatin (CRESTOR) 5 MG tablet Take 1 tablet (5 mg total) by mouth 3 (three) times a week. 45 tablet 3  . sodium chloride (OCEAN) 0.65 % SOLN nasal spray Place 1 spray into both nostrils daily.    . sucralfate (CARAFATE) 1 g tablet 1 tab po tid 90 tablet 0  . SUMAtriptan (IMITREX)  50 MG tablet Take 1 tablet (50 mg total) by mouth every 2 (two) hours as needed for migraine. May repeat in 2 hours if headache persists or recurs. 10 tablet 3   . zolpidem (AMBIEN) 10 MG tablet Take 1 tablet (10 mg total) by mouth at bedtime as needed for sleep. 30 tablet 2   No current facility-administered medications for this visit.     Psychiatric Specialty Exam: Review of Systems  Psychiatric/Behavioral: The patient is nervous/anxious.   All other systems reviewed and are negative.   Last menstrual period 01/08/2013.There is no height or weight on file to calculate BMI.  General Appearance: NA  Eye Contact:  NA  Speech:  Clear and Coherent and Normal Rate  Volume:  Normal  Mood:  Anxious  Affect:  NA  Thought Process:  Goal Directed and Linear  Orientation:  Full (Time, Place, and Person)  Thought Content: Logical   Suicidal Thoughts:  No  Homicidal Thoughts:  No  Memory:  Immediate;   Good Recent;   Good Remote;   Good  Judgement:  Good  Insight:  Good  Psychomotor Activity:  NA  Concentration:  Concentration: Good  Recall:  Good  Fund of Knowledge: Good  Language: Good  Akathisia:  Negative  Handed:  Right  AIMS (if indicated): not done  Assets:  Communication Skills Desire for Improvement Financial Resources/Insurance Housing Intimacy Social Support Talents/Skills  ADL's:  Intact  Cognition: WNL  Sleep:  Good    Assessment and Plan: 44 yo married AAFwith along standinghx ofdepression (on and off) and generalized anxiety. She used to have frequent panic attacks and is prescribed alprazolam 1 mg bid prn these. They are much less frequent now but they still occur occasionally. She is on buspirone and fluoxetine combination for depression/anxiety. In thepast she tried citalopram and venlafaxine but could not tolerate excessive sweating and other adverse effects which now, in perspective, she thinks might have been more related to her menopausal symptoms. 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 She has never been formally diagnosed with ADD. We added Adderall XR 30 mg and she noticed considerable improvement in focusing ability, less fatigue and less anxiety as well.Anxiety related to COVID situation however continues.  Dx: GAD; Panic disorder; ADD adult type; MDD recurrent, mild  Plan: Continue Prozac 60 mg daily, Buspar 15 mg bid, zolpidem 10 mg prn sleep,alprazolamto1mg bidprn panic attacks and AdderallXRto30mg  daily.Next appointment inthreemonths.The plan was discussed with patient who had an opportunity to ask questions and these were all answered. I spend42minutes inphone consultationwith the patient    Stephanie Acre, MD 08/25/2019, 11:17 AM

## 2019-08-26 ENCOUNTER — Other Ambulatory Visit: Payer: Self-pay | Admitting: Medical

## 2019-08-27 LAB — NOVEL CORONAVIRUS, NAA: SARS-CoV-2, NAA: NOT DETECTED

## 2019-09-01 NOTE — Telephone Encounter (Signed)
2nd attempt at trying to make contact with the pt, to arrange her follow-up appt with Dr. Delton See, and answer any questions she had about needing a stress test.  Left her a detailed message to call the office back, to further assist.

## 2019-09-01 NOTE — Telephone Encounter (Signed)
Scheduled the pt to come in and see Dr. Delton See for 09/07/19 at 4 pm. Pt is aware to arrive 15 mins prior to this appt, and wear her facial mask to the appt and during the entire duration of this appt.  Pt verbalized understanding and agrees with this plan.

## 2019-09-05 ENCOUNTER — Telehealth: Payer: Self-pay | Admitting: Family Medicine

## 2019-09-05 ENCOUNTER — Telehealth: Payer: Self-pay

## 2019-09-05 NOTE — Telephone Encounter (Signed)
PA approved.  Request Reference Number: [id number]. FAMOTIDINE TAB 40MG  is approved through 09/04/2020. Your patient may now fill this prescription and it will be covered.

## 2019-09-05 NOTE — Telephone Encounter (Signed)
promethazine (PHENERGAN) 25 MG tablet  Patient would also like Rx, refilled  Patient has not contacted Pharmacy

## 2019-09-05 NOTE — Telephone Encounter (Signed)
PA initiated via Covermymeds; KEY: [id number]. Awaiting determination.

## 2019-09-05 NOTE — Telephone Encounter (Signed)
Patient would like to talk with Dr about a referral to ENT. Pt states that she saw Dr last week and symptoms have worsened. Patent like to referred to ENT.

## 2019-09-06 ENCOUNTER — Other Ambulatory Visit: Payer: Self-pay | Admitting: Family Medicine

## 2019-09-06 DIAGNOSIS — H9209 Otalgia, unspecified ear: Secondary | ICD-10-CM

## 2019-09-06 DIAGNOSIS — T7840XS Allergy, unspecified, sequela: Secondary | ICD-10-CM

## 2019-09-06 DIAGNOSIS — J019 Acute sinusitis, unspecified: Secondary | ICD-10-CM

## 2019-09-06 MED ORDER — PROMETHAZINE HCL 25 MG PO TABS: 25.0000 mg | ORAL_TABLET | Freq: Three times a day (TID) | ORAL | 1 refills | Status: DC | PRN

## 2019-09-06 NOTE — Telephone Encounter (Signed)
Please advise 

## 2019-09-06 NOTE — Telephone Encounter (Signed)
I have referred to ENT and sent in promethazine. Set her up an appt if she would like to talk further

## 2019-09-07 ENCOUNTER — Ambulatory Visit: Payer: 59 | Admitting: Cardiology

## 2019-09-07 NOTE — Telephone Encounter (Signed)
Left detailed message on machine that referral and medication was sent in.  Also she can let us know she would like to make an appt to discuss further.

## 2019-09-13 ENCOUNTER — Other Ambulatory Visit: Payer: Self-pay | Admitting: Medical

## 2019-09-13 ENCOUNTER — Other Ambulatory Visit: Payer: Self-pay | Admitting: Family Medicine

## 2019-09-14 ENCOUNTER — Other Ambulatory Visit: Payer: Self-pay | Admitting: Family Medicine

## 2019-09-14 ENCOUNTER — Ambulatory Visit: Payer: 59 | Attending: Internal Medicine

## 2019-09-14 DIAGNOSIS — Z20822 Contact with and (suspected) exposure to covid-19: Secondary | ICD-10-CM

## 2019-09-14 NOTE — Telephone Encounter (Signed)
Please clarify if patient wants the 100 mg Gabapentin any longer. I thought she was taking 300 mg po bid and 600 mg qhs

## 2019-09-14 NOTE — Telephone Encounter (Signed)
Dr Abner Greenspan -- pharmacy is requesting refill of gabapentin 100mg . Last RX on med list was 01/13/19, #30. Has been getting 300mg , 4 a day more recently.  Is pt supposed to be taking both strengths?

## 2019-09-15 LAB — NOVEL CORONAVIRUS, NAA: SARS-CoV-2, NAA: NOT DETECTED

## 2019-09-16 ENCOUNTER — Other Ambulatory Visit: Payer: Self-pay

## 2019-09-16 ENCOUNTER — Encounter: Payer: Self-pay | Admitting: Cardiology

## 2019-09-16 ENCOUNTER — Ambulatory Visit (INDEPENDENT_AMBULATORY_CARE_PROVIDER_SITE_OTHER): Payer: 59 | Admitting: Cardiology

## 2019-09-16 VITALS — BP 132/90 | HR 98 | Ht 63.0 in | Wt 198.4 lb

## 2019-09-16 DIAGNOSIS — E782 Mixed hyperlipidemia: Secondary | ICD-10-CM

## 2019-09-16 DIAGNOSIS — U071 COVID-19: Secondary | ICD-10-CM

## 2019-09-16 DIAGNOSIS — R000 Tachycardia, unspecified: Secondary | ICD-10-CM

## 2019-09-16 MED ORDER — PROPRANOLOL HCL 60 MG PO TABS: 60.0000 mg | ORAL_TABLET | Freq: Two times a day (BID) | ORAL | 2 refills | Status: DC

## 2019-09-16 NOTE — Patient Instructions (Signed)
Medication Instructions:   INCREASE YOUR PROPRANOLOL TO 60 MG BY MOUTH TWICE DAILY  *If you need a refill on your cardiac medications before your next appointment, please call your pharmacy*   Lab Work:  NEXT WEEK TO CHECK--CMET, CBC, TSH, LIPIDS--PLEASE COME FASTING TO THIS LAB APPOINTMENT  If you have labs (blood work) drawn today and your tests are completely normal, you will receive your results only by: Marland Kitchen MyChart Message (if you have MyChart) OR . A paper copy in the mail If you have any lab test that is abnormal or we need to change your treatment, we will call you to review the results.   Testing/Procedures:  Your physician has requested that you have an echocardiogram. Echocardiography is a painless test that uses sound waves to create images of your heart. It provides your doctor with information about the size and shape of your heart and how well your heart's chambers and valves are working. This procedure takes approximately one hour. There are no restrictions for this procedure.     Follow-Up: At [facility], you and your health needs are our priority.  As part of our continuing mission to provide you with exceptional heart care, we have created designated Provider Care Teams.  These Care Teams include your primary Cardiologist (physician) and Advanced Practice Providers (APPs -  Physician Assistants and Nurse Practitioners) who all work together to provide you with the care you need, when you need it.  Your next appointment:   6 month(s)  The format for your next appointment:   In Person  Provider:   Tobias Alexander, MD

## 2019-09-16 NOTE — Progress Notes (Signed)
Cardiology Office Note:    Date:  09/16/2019   ID:  Nicole Ball, DOB 19-Mar-1976, MRN [id number]  PCP:  Mosie Lukes, MD  Cardiologist:  Ena Dawley, MD  Electrophysiologist:  None   Referring MD: Mosie Lukes, MD   Reason for visit: 6 months follow up  History of Present Illness:    Nicole Ball is a 44 y.o. female with a hx of DM 2 (for the last 2-3 years with PCOS and early menopause), HTN, anxiety who has noticed palpitations and tachycardia in March 2016, she was started on metoprolol and her symptoms improved however she continues to be tachycardic. They are very uncomfortable, but no dizziness or syncope. She was diagnosed with fatty liver and gallbladder stone and she is supposed to see a surgeon the next week.  She is undergoing significant stress with her husband who is also our patient requires myectomy for HCM. H/O MI in grandparents. She was diagnosed with sleep apnea in 2020 and started to use CPAP machine.  That has helped significantly.  She was also seen by Dr. Caryl Comes who diagnosed her with inappropriate sinus tachycardia secondary to stress and increase the dose of her propranolol.  12/29/2019 - 8 months follow up, she had COVID-19 infection back in April 2020, she has since recovered, she has noticed that her heart rate is higher again, she works from home and denies any chest pain or shortness of breath, no dizziness or syncope.  She admits to not drinking enough fluids during the day.  Past Medical History:  Diagnosis Date  . Abnormal serum level of alkaline phosphatase 05/21/2014  . Acute low back pain 12/19/2010  . ALLERGIC RHINITIS 10/16/2010  . Allergy    seasonal  . Anxiety and depression 06/07/2011  . Atrophic vaginitis 08/17/2016  . Cervical cancer screening 01/10/2014   Menarche at 8 Regular and moderate flow, became irregular until OCPs  history of abnormal pap in past, repeat was normal Last pap roughly 3 years ago G0P0, s/p No history of  abnormal MGM, no h/o MGM  No concerns today no gyn surgeries LMP May 2013  . CTS (carpal tunnel syndrome) 01/15/2014  . Diabetes mellitus type 2 in obese ([facility]) 10/30/2011  . Dry eyes 08/17/2016  . Early menopause   . Elevated BP 04/21/2011  . Essential hypertension   . HIATAL HERNIA WITH REFLUX, HX OF 10/16/2010  . Hyperlipidemia 04/21/2011  . Inappropriate sinus tachycardia   . INSOMNIA 10/16/2010  . Knee pain, left 03/11/2012  . Migraine 03/07/2013  . Obesity   . Obesity (BMI 30-39.9) 01/12/2018  . Palpitations 01/28/2015  . Reflux 04/22/2012  . Sleep apnea 06/07/2011  . Tobacco use disorder 02/10/2017  . Urinary urgency 08/17/2016  . UTI'S, HX OF 10/16/2010    Past Surgical History:  Procedure Laterality Date  . CHOLECYSTECTOMY      Current Medications: Current Meds  Medication Sig  . ACETAMINOPHEN-BUTALBITAL 50-325 MG TABS Take 1 tablet by mouth 2 (two) times daily as needed (HEADACHES).  . ALPRAZolam (XANAX) 1 MG tablet Take 1 tablet (1 mg total) by mouth 2 (two) times daily as needed for anxiety.  Marland Kitchen amphetamine-dextroamphetamine (ADDERALL XR) 30 MG 24 hr capsule Take 1 capsule (30 mg total) by mouth daily.  Marland Kitchen azelastine (ASTELIN) 0.1 % nasal spray Place 2 sprays into both nostrils 2 (two) times daily. Use in each nostril as directed  . BAYER MICROLET LANCETS lancets Use as directed once daily to check blood sugar.  DX E11.9  . BLACK COHOSH PO Take 1 tablet by mouth daily.  . Blood Glucose Monitoring Suppl (CONTOUR BLOOD GLUCOSE SYSTEM) DEVI Use daily to check blood sugar. DX E11.9  . busPIRone (BUSPAR) 15 MG tablet Take 1 tablet (15 mg total) by mouth 2 (two) times daily.  Marland Kitchen conjugated estrogens (PREMARIN) vaginal cream Apply small amount to affected area twice a week  . CONTOUR NEXT TEST test strip USE TO CHECK BLOOD SUGAR DAILY  . cyclobenzaprine (FLEXERIL) 10 MG tablet Take 1 tablet (10 mg total) by mouth at bedtime.  . famotidine (PEPCID) 40 MG tablet Take 1 tablet (40 mg total) by mouth  at bedtime.  . fluconazole (DIFLUCAN) 150 MG tablet Take 1 tablet (150 mg total) by mouth as needed.  Marland Kitchen FLUoxetine (PROZAC) 20 MG capsule Take 1 capsule (20 mg total) by mouth 3 (three) times daily.  . fluticasone (FLONASE) 50 MCG/ACT nasal spray Place 2 sprays into both nostrils daily.  Marland Kitchen gabapentin (NEURONTIN) 100 MG capsule Take 100 mg by mouth daily.  Marland Kitchen gabapentin (NEURONTIN) 300 MG capsule TAKE 1 CAPSULE BY MOUTH TWICE DAILY AND 2 CAPSULES BY MOUTH AT BEDTIME  . Ginger, Zingiber officinalis, (GINGER PO) Take 1 tablet by mouth daily.  Marland Kitchen HYDROcodone-acetaminophen (NORCO/VICODIN) 5-325 MG tablet Take 1 tablet by mouth every 6 (six) hours as needed. Patient needs appointment for further refills.  . hyoscyamine (LEVSIN SL) 0.125 MG SL tablet Take 1 tablet (0.125 mg total) by mouth every 4 (four) hours as needed.  Marland Kitchen levocetirizine (XYZAL) 5 MG tablet TAKE 1 TABLET(5 MG) BY MOUTH EVERY EVENING  . losartan (COZAAR) 50 MG tablet TAKE 1 TABLET(50 MG) BY MOUTH DAILY  . meloxicam (MOBIC) 15 MG tablet Take 1 tablet (15 mg total) by mouth daily as needed for pain.  . metFORMIN (GLUCOPHAGE) 500 MG tablet TAKE 2 TABLETS BY MOUTH TWICE DAILY  . montelukast (SINGULAIR) 10 MG tablet TAKE 1 TABLET(10 MG) BY MOUTH AT BEDTIME  . omeprazole (PRILOSEC) 20 MG capsule Take 1 capsule (20 mg total) by mouth 2 (two) times daily before a meal.  . Probiotic Product (PROBIOTIC PO) Take 1 tablet by mouth daily.  . promethazine (PHENERGAN) 25 MG tablet Take 1 tablet (25 mg total) by mouth every 8 (eight) hours as needed for nausea or vomiting. Refill when current expires.  . sodium chloride (OCEAN) 0.65 % SOLN nasal spray Place 1 spray into both nostrils daily.  . SUMAtriptan (IMITREX) 50 MG tablet Take 1 tablet (50 mg total) by mouth every 2 (two) hours as needed for migraine. May repeat in 2 hours if headache persists or recurs.  Marland Kitchen VITAMIN D PO Take 2,000 Units by mouth daily.  Marland Kitchen zolpidem (AMBIEN) 10 MG tablet Take 1  tablet (10 mg total) by mouth at bedtime as needed for sleep.  . [DISCONTINUED] propranolol (INDERAL) 40 MG tablet Take 1 tablet (40 mg total) by mouth 2 (two) times daily.     Allergies:   Patient has no known allergies.   Social History   Socioeconomic History  . Marital status: Married    Spouse name: Not on file  . Number of children: Not on file  . Years of education: Not on file  . Highest education level: Not on file  Occupational History  . Not on file  Tobacco Use  . Smoking status: Current Some Day Smoker    Packs/day: 0.10    Types: Cigars  . Smokeless tobacco: Never Used  . Tobacco comment:  1 black and milds  Substance and Sexual Activity  . Alcohol use: Yes    Comment: twice/year  . Drug use: No  . Sexual activity: Not on file  Other Topics Concern  . Not on file  Social History Narrative   Married lives w/ husband, works at [employer] Strain:   . Difficulty of Paying Living Expenses: Not on file  Food Insecurity:   . Worried About Charity fundraiser in the Last Year: Not on file  . Ran Out of Food in the Last Year: Not on file  Transportation Needs:   . Lack of Transportation (Medical): Not on file  . Lack of Transportation (Non-Medical): Not on file  Physical Activity:   . Days of Exercise per Week: Not on file  . Minutes of Exercise per Session: Not on file  Stress:   . Feeling of Stress : Not on file  Social Connections:   . Frequency of Communication with Friends and Family: Not on file  . Frequency of Social Gatherings with Friends and Family: Not on file  . Attends Religious Services: Not on file  . Active Member of Clubs or Organizations: Not on file  . Attends Archivist Meetings: Not on file  . Marital Status: Not on file     Family History: The patient's family history includes Alcohol abuse in her father, maternal aunt, and maternal uncle; Allergies in her brother;  Arthritis in her maternal grandmother and another family member; Asthma in her sister; Breast cancer in her paternal grandmother; Cancer in her father, maternal grandfather, and paternal grandmother; Cataracts in her maternal grandmother; Coronary artery disease in her maternal grandfather; Diabetes in her maternal grandfather and paternal grandfather; Glaucoma in her maternal grandmother; Lupus in her mother; Scleroderma in her maternal grandmother; Thyroid disease in her paternal grandmother and sister.  ROS:   Please see the history of present illness.    All other systems reviewed and are negative.  EKGs/Labs/Other Studies Reviewed:    The following studies were reviewed today:  EKG:  EKG is ordered today.  The ekg ordered today demonstrates normal sinus rhythm, 96 bpm, otherwise normal EKG unchanged from prior.  Recent Labs: No results found for requested labs within last 8760 hours.  Recent Lipid Panel    Component Value Date/Time   CHOL 159 01/14/2018 1221   TRIG 155.0 (H) 01/14/2018 1221   HDL 67.60 01/14/2018 1221   CHOLHDL 2 01/14/2018 1221   VLDL 31.0 01/14/2018 1221   LDLCALC 61 01/14/2018 1221   Physical Exam:    VS:  BP 132/90   Pulse 98   Ht 5' 3"  (1.6 m)   Wt 198 lb 6.4 oz (90 kg)   LMP 01/08/2013   SpO2 99%   BMI 35.14 kg/m     Wt Readings from Last 3 Encounters:  09/16/19 198 lb 6.4 oz (90 kg)  04/28/19 196 lb (88.9 kg)  03/04/19 204 lb (92.5 kg)    GEN:  Well nourished, well developed in no acute distress HEENT: Normal NECK: No JVD; No carotid bruits LYMPHATICS: No lymphadenopathy CARDIAC: RRR, no murmurs, rubs, gallops RESPIRATORY:  Clear to auscultation without rales, wheezing or rhonchi  ABDOMEN: Soft, non-tender, non-distended MUSCULOSKELETAL:  No edema; No deformity  SKIN: Warm and dry NEUROLOGIC:  Alert and oriented x 3 PSYCHIATRIC:  Normal affect   ASSESSMENT:    1. Mixed hyperlipidemia   2. Inappropriate sinus  tachycardia   3.  COVID-19    PLAN:    In order of problems listed above:  1. Inappropriate sinus tachycardia  -she is encouraged to increase her fluid intake, we will increase propranolol to 60 mg p.o. twice daily.   2. Hypertension  -increase propranolol to 60 mg p.o. twice daily 3. Hyperlipidemia  - started rosuvastatin 5 mg 3 times a week, she is tolerating it well.  We will obtain lipids and LFTs. 4. Post Covid 19 infection -  We will obtain an echocardiogram to evaluate LV systolic and diastolic function.  Medication Adjustments/Labs and Tests Ordered: Current medicines are reviewed at length with the patient today.  Concerns regarding medicines are outlined above.   Orders Placed This Encounter  Procedures  . Comp Met (CMET)  . CBC  . TSH  . Lipid Profile  . EKG 12-Lead  . ECHOCARDIOGRAM COMPLETE   Meds ordered this encounter  Medications  . propranolol (INDERAL) 60 MG tablet    Sig: Take 1 tablet (60 mg total) by mouth 2 (two) times daily.    Dispense:  180 tablet    Refill:  2   Patient Instructions  Medication Instructions:   INCREASE YOUR PROPRANOLOL TO 60 MG BY MOUTH TWICE DAILY  *If you need a refill on your cardiac medications before your next appointment, please call your pharmacy*   Lab Work:  NEXT WEEK TO CHECK--CMET, CBC, TSH, LIPIDS--PLEASE COME FASTING TO THIS LAB APPOINTMENT  If you have labs (blood work) drawn today and your tests are completely normal, you will receive your results only by: Marland Kitchen MyChart Message (if you have MyChart) OR . A paper copy in the mail If you have any lab test that is abnormal or we need to change your treatment, we will call you to review the results.   Testing/Procedures:  Your physician has requested that you have an echocardiogram. Echocardiography is a painless test that uses sound waves to create images of your heart. It provides your doctor with information about the size and shape of your heart and how well your heart's chambers  and valves are working. This procedure takes approximately one hour. There are no restrictions for this procedure.     Follow-Up: At [facility], you and your health needs are our priority.  As part of our continuing mission to provide you with exceptional heart care, we have created designated Provider Care Teams.  These Care Teams include your primary Cardiologist (physician) and Advanced Practice Providers (APPs -  Physician Assistants and Nurse Practitioners) who all work together to provide you with the care you need, when you need it.  Your next appointment:   6 month(s)  The format for your next appointment:   In Person  Provider:   Ena Dawley, MD       Signed, Ena Dawley, MD  09/16/2019 4:19 PM    [place]

## 2019-09-19 ENCOUNTER — Other Ambulatory Visit: Payer: 59 | Admitting: *Deleted

## 2019-09-19 ENCOUNTER — Other Ambulatory Visit: Payer: Self-pay

## 2019-09-19 DIAGNOSIS — E782 Mixed hyperlipidemia: Secondary | ICD-10-CM

## 2019-09-19 DIAGNOSIS — U071 COVID-19: Secondary | ICD-10-CM

## 2019-09-19 DIAGNOSIS — R000 Tachycardia, unspecified: Secondary | ICD-10-CM

## 2019-09-20 ENCOUNTER — Telehealth: Payer: Self-pay | Admitting: Family Medicine

## 2019-09-20 LAB — COMPREHENSIVE METABOLIC PANEL
ALT: 19 IU/L (ref 0–32)
AST: 18 IU/L (ref 0–40)
Albumin/Globulin Ratio: 1.5 (ref 1.2–2.2)
Albumin: 4.4 g/dL (ref 3.8–4.8)
Alkaline Phosphatase: 150 IU/L — ABNORMAL HIGH (ref 39–117)
BUN/Creatinine Ratio: 14 (ref 9–23)
BUN: 10 mg/dL (ref 6–24)
Bilirubin Total: <0.2 mg/dL (ref 0.0–1.2)
CO2: 25 mmol/L (ref 20–29)
Calcium: 9.7 mg/dL (ref 8.7–10.2)
Chloride: 101 mmol/L (ref 96–106)
Creatinine, Ser: 0.72 mg/dL (ref 0.57–1.00)
GFR calc Af Amer: 117 mL/min/{1.73_m2} (ref >59)
GFR calc non Af Amer: 101 mL/min/{1.73_m2} (ref >59)
Globulin, Total: 2.9 g/dL (ref 1.5–4.5)
Glucose: 101 mg/dL — ABNORMAL HIGH (ref 65–99)
Potassium: 4.2 mmol/L (ref 3.5–5.2)
Sodium: 138 mmol/L (ref 134–144)
Total Protein: 7.3 g/dL (ref 6.0–8.5)

## 2019-09-20 LAB — CBC
Hematocrit: 37.6 % (ref 34.0–46.6)
Hemoglobin: 12 g/dL (ref 11.1–15.9)
MCH: 27.9 pg (ref 26.6–33.0)
MCHC: 31.9 g/dL (ref 31.5–35.7)
MCV: 87 fL (ref 79–97)
Platelets: 231 10*3/uL (ref 150–450)
RBC: 4.3 x10E6/uL (ref 3.77–5.28)
RDW: 14.9 % (ref 11.7–15.4)
WBC: 6.3 10*3/uL (ref 3.4–10.8)

## 2019-09-20 LAB — LIPID PANEL
Chol/HDL Ratio: 2.7 ratio (ref 0.0–4.4)
Cholesterol, Total: 174 mg/dL (ref 100–199)
HDL: 65 mg/dL (ref >39)
LDL Chol Calc (NIH): 87 mg/dL (ref 0–99)
Triglycerides: 126 mg/dL (ref 0–149)
VLDL Cholesterol Cal: 22 mg/dL (ref 5–40)

## 2019-09-20 LAB — TSH: TSH: 1.59 u[IU]/mL (ref 0.450–4.500)

## 2019-09-20 NOTE — Telephone Encounter (Signed)
The patient would like to know if it is safe for her to take [place] along with her other medications to help lower her BP

## 2019-09-20 NOTE — Telephone Encounter (Signed)
Yes it is safe in moderation. But she should monitor her blood pressure carefully to watch for any precipitous drop.

## 2019-09-20 NOTE — Telephone Encounter (Signed)
During the day she is taking the 100's and at night the 600. But some days she takes as prescribed 300 mg bid and 600 at night. She just has days where the 300 makes her too tired . She prefers the 100 mg some days to help but not make her so tired. So she does want the 100 mg refill to have all on hand.

## 2019-09-20 NOTE — Telephone Encounter (Signed)
Patient informed of PCP instructions. 

## 2019-09-28 ENCOUNTER — Telehealth (HOSPITAL_COMMUNITY): Payer: Self-pay | Admitting: Cardiology

## 2019-09-28 NOTE — Telephone Encounter (Signed)
Left message for patient to call office to reschedule appt due to weather. /LBW

## 2019-09-29 ENCOUNTER — Other Ambulatory Visit (HOSPITAL_COMMUNITY): Payer: 59

## 2019-10-03 ENCOUNTER — Other Ambulatory Visit: Payer: Self-pay | Admitting: Family Medicine

## 2019-10-03 ENCOUNTER — Other Ambulatory Visit: Payer: Self-pay | Admitting: Medical

## 2019-10-03 DIAGNOSIS — M25562 Pain in left knee: Secondary | ICD-10-CM

## 2019-10-04 MED ORDER — BUTALBITAL-ACETAMINOPHEN 50-325 MG PO TABS: 1.0000 | ORAL_TABLET | Freq: Two times a day (BID) | ORAL | 0 refills | Status: DC | PRN

## 2019-10-04 MED ORDER — HYDROCODONE-ACETAMINOPHEN 5-325 MG PO TABS: 1.0000 | ORAL_TABLET | Freq: Four times a day (QID) | ORAL | 0 refills | Status: DC | PRN

## 2019-10-20 ENCOUNTER — Other Ambulatory Visit (HOSPITAL_COMMUNITY): Payer: 59

## 2019-11-01 ENCOUNTER — Other Ambulatory Visit: Payer: Self-pay

## 2019-11-01 ENCOUNTER — Ambulatory Visit: Payer: 59 | Admitting: Medical

## 2019-11-01 ENCOUNTER — Encounter: Payer: Self-pay | Admitting: Medical

## 2019-11-01 DIAGNOSIS — M25562 Pain in left knee: Secondary | ICD-10-CM

## 2019-11-01 MED ORDER — PREDNISONE 10 MG PO TABS: ORAL_TABLET | ORAL | 0 refills | Status: DC

## 2019-11-01 MED ORDER — HYDROCODONE-ACETAMINOPHEN 5-325 MG PO TABS: 1.0000 | ORAL_TABLET | Freq: Four times a day (QID) | ORAL | 0 refills | Status: DC | PRN

## 2019-11-01 MED ORDER — KETOROLAC TROMETHAMINE 60 MG/2ML IM SOLN
60.0000 mg | Freq: Once | INTRAMUSCULAR | Status: AC
  Administered 2019-11-01: 60 mg via INTRAMUSCULAR

## 2019-11-01 NOTE — Progress Notes (Signed)
Subjective:    Patient ID: Nicole Ball, female    DOB: [birth year], 44 y.o.   MRN: [id number]  HPI   Pt in for evaluation. Pt has had some back pain with some pain down her rt leg. She states off and on for 6 weeks her legs feel like walking waits. Her lower back is source of pain. Pain has been present since at least 07-2018 per pt. Xray back then showed below.  IMPRESSION: Moderate disc space narrowing at L4-5 and L5-S1. Other disc spaces appear unremarkable. No fracture or spondylolisthesis.  Pt states recent low back spasming and has some pain radiating to rt lower ext.  Pt has been taking ibuprfone, hydrocodone, gabapentin and flexeril.    Review of Systems  Constitutional: Negative for chills, fatigue and fever.  Respiratory: Negative for cough, chest tightness, shortness of breath and wheezing.   Cardiovascular: Negative for chest pain and palpitations.  Gastrointestinal: Negative for abdominal pain.  Musculoskeletal: Positive for back pain. Negative for myalgias and neck stiffness.  Skin: Negative for rash.  Neurological: Negative for dizziness, syncope, weakness and headaches.  Hematological: Negative for adenopathy. Does not bruise/bleed easily.  Psychiatric/Behavioral: Negative for behavioral problems and confusion. The patient is not nervous/anxious.     Past Medical History:  Diagnosis Date  . Abnormal serum level of alkaline phosphatase 05/21/2014  . Acute low back pain 12/19/2010  . ALLERGIC RHINITIS 10/16/2010  . Allergy    seasonal  . Anxiety and depression 06/07/2011  . Atrophic vaginitis 08/17/2016  . Cervical cancer screening 01/10/2014   Menarche at 8 Regular and moderate flow, became irregular until OCPs  history of abnormal pap in past, repeat was normal Last pap roughly 3 years ago G0P0, s/p No history of abnormal MGM, no h/o MGM  No concerns today no gyn surgeries LMP May 2013  . CTS (carpal tunnel syndrome) 01/15/2014  . Diabetes mellitus type 2 in  obese ([facility]) 10/30/2011  . Dry eyes 08/17/2016  . Early menopause   . Elevated BP 04/21/2011  . Essential hypertension   . HIATAL HERNIA WITH REFLUX, HX OF 10/16/2010  . Hyperlipidemia 04/21/2011  . Inappropriate sinus tachycardia   . INSOMNIA 10/16/2010  . Knee pain, left 03/11/2012  . Migraine 03/07/2013  . Obesity   . Obesity (BMI 30-39.9) 01/12/2018  . Palpitations 01/28/2015  . Reflux 04/22/2012  . Sleep apnea 06/07/2011  . Tobacco use disorder 02/10/2017  . Urinary urgency 08/17/2016  . UTI'S, HX OF 10/16/2010     Social History   Socioeconomic History  . Marital status: Married    Spouse name: Not on file  . Number of children: Not on file  . Years of education: Not on file  . Highest education level: Not on file  Occupational History  . Not on file  Tobacco Use  . Smoking status: Current Some Day Smoker    Packs/day: 0.10    Types: Cigars  . Smokeless tobacco: Never Used  . Tobacco comment: 1 black and milds  Substance and Sexual Activity  . Alcohol use: Yes    Comment: twice/year  . Drug use: No  . Sexual activity: Not on file  Other Topics Concern  . Not on file  Social History Narrative   Married lives w/ husband, works at [employer]   Social Determinants of Corporate investment banker Strain:   . Difficulty of Paying Living Expenses:   Food Insecurity:   . Worried About Programme researcher, broadcasting/film/video  in the Last Year:   . [place] in the Last Year:   Transportation Needs:   . Film/video editor (Medical):   Marland Kitchen Lack of Transportation (Non-Medical):   Physical Activity:   . Days of Exercise per Week:   . Minutes of Exercise per Session:   Stress:   . Feeling of Stress :   Social Connections:   . Frequency of Communication with Friends and Family:   . Frequency of Social Gatherings with Friends and Family:   . Attends Religious Services:   . Active Member of Clubs or Organizations:   . Attends Archivist Meetings:   Marland Kitchen Marital Status:   Intimate  Partner Violence:   . Fear of Current or Ex-Partner:   . Emotionally Abused:   Marland Kitchen Physically Abused:   . Sexually Abused:     Past Surgical History:  Procedure Laterality Date  . CHOLECYSTECTOMY      Family History  Problem Relation Age of Onset  . Lupus Mother   . Arthritis Maternal Grandmother   . Scleroderma Maternal Grandmother   . Glaucoma Maternal Grandmother   . Cataracts Maternal Grandmother   . Cancer Maternal Grandfather        prostate  . Coronary artery disease Maternal Grandfather        s/p angioplasty  . Diabetes Maternal Grandfather        Type 2  . Cancer Paternal Grandmother        Breast ca- dx in 57's  . Thyroid disease Paternal Grandmother   . Breast cancer Paternal Grandmother   . Diabetes Paternal Grandfather   . Thyroid disease Sister   . Asthma Sister   . Allergies Brother   . Cancer Father        cigarette, alcohol HEENT squamous cell  . Alcohol abuse Father   . Arthritis Other   . Alcohol abuse Maternal Aunt   . Alcohol abuse Maternal Uncle     No Known Allergies  Current Outpatient Medications on File Prior to Visit  Medication Sig Dispense Refill  . ACETAMINOPHEN-BUTALBITAL 50-325 MG TABS Take 1 tablet by mouth 2 (two) times daily as needed (HEADACHES). 60 tablet 0  . amphetamine-dextroamphetamine (ADDERALL XR) 30 MG 24 hr capsule Take 1 capsule (30 mg total) by mouth daily. 30 capsule 0  . azelastine (ASTELIN) 0.1 % nasal spray Place 2 sprays into both nostrils 2 (two) times daily. Use in each nostril as directed 30 mL 12  . BAYER MICROLET LANCETS lancets Use as directed once daily to check blood sugar.   DX E11.9 100 each 1  . BLACK COHOSH PO Take 1 tablet by mouth daily.    . Blood Glucose Monitoring Suppl (CONTOUR BLOOD GLUCOSE SYSTEM) DEVI Use daily to check blood sugar. DX E11.9 1 Device 0  . conjugated estrogens (PREMARIN) vaginal cream Apply small amount to affected area twice a week 42.5 g 1  . CONTOUR NEXT TEST test strip USE  TO CHECK BLOOD SUGAR DAILY 100 each 2  . cyclobenzaprine (FLEXERIL) 10 MG tablet Take 1 tablet (10 mg total) by mouth at bedtime. 60 tablet 2  . famotidine (PEPCID) 40 MG tablet Take 1 tablet (40 mg total) by mouth at bedtime. 30 tablet 5  . fluconazole (DIFLUCAN) 150 MG tablet Take 1 tablet (150 mg total) by mouth as needed. 1 tablet 0  . FLUoxetine (PROZAC) 20 MG capsule Take 1 capsule (20 mg total) by mouth 3 (three) times daily.  90 capsule 5  . fluticasone (FLONASE) 50 MCG/ACT nasal spray Place 2 sprays into both nostrils daily. 16 g 1  . gabapentin (NEURONTIN) 100 MG capsule TAKE 1 CAPSULE(100 MG) BY MOUTH AT BEDTIME 30 capsule 0  . gabapentin (NEURONTIN) 100 MG capsule Take 100 mg by mouth daily.    Marland Kitchen gabapentin (NEURONTIN) 300 MG capsule TAKE 1 CAPSULE BY MOUTH TWICE DAILY AND 2 CAPSULES BY MOUTH AT BEDTIME 120 capsule 1  . Ginger, Zingiber officinalis, (GINGER PO) Take 1 tablet by mouth daily.    Marland Kitchen HYDROcodone-acetaminophen (NORCO/VICODIN) 5-325 MG tablet Take 1 tablet by mouth every 6 (six) hours as needed. Patient needs appointment for further refills. 100 tablet 0  . hyoscyamine (LEVSIN SL) 0.125 MG SL tablet Take 1 tablet (0.125 mg total) by mouth every 4 (four) hours as needed. 30 tablet 0  . levocetirizine (XYZAL) 5 MG tablet TAKE 1 TABLET(5 MG) BY MOUTH EVERY EVENING 30 tablet 5  . losartan (COZAAR) 50 MG tablet TAKE 1 TABLET(50 MG) BY MOUTH DAILY 90 tablet 2  . meloxicam (MOBIC) 15 MG tablet Take 1 tablet (15 mg total) by mouth daily as needed for pain. 90 tablet 1  . metFORMIN (GLUCOPHAGE) 500 MG tablet TAKE 2 TABLETS BY MOUTH TWICE DAILY 360 tablet 1  . montelukast (SINGULAIR) 10 MG tablet TAKE 1 TABLET(10 MG) BY MOUTH AT BEDTIME 90 tablet 1  . omeprazole (PRILOSEC) 20 MG capsule Take 1 capsule (20 mg total) by mouth 2 (two) times daily before a meal. 180 capsule 3  . Probiotic Product (PROBIOTIC PO) Take 1 tablet by mouth daily.    . promethazine (PHENERGAN) 25 MG tablet Take 1  tablet (25 mg total) by mouth every 8 (eight) hours as needed for nausea or vomiting. Refill when current expires. 20 tablet 1  . propranolol (INDERAL) 60 MG tablet Take 1 tablet (60 mg total) by mouth 2 (two) times daily. 180 tablet 2  . rosuvastatin (CRESTOR) 5 MG tablet Take 1 tablet (5 mg total) by mouth 3 (three) times a week. (Patient not taking: Reported on 09/16/2019) 45 tablet 3  . sodium chloride (OCEAN) 0.65 % SOLN nasal spray Place 1 spray into both nostrils daily.    . SUMAtriptan (IMITREX) 50 MG tablet Take 1 tablet (50 mg total) by mouth every 2 (two) hours as needed for migraine. May repeat in 2 hours if headache persists or recurs. 10 tablet 3  . VITAMIN D PO Take 2,000 Units by mouth daily.    Marland Kitchen zolpidem (AMBIEN) 10 MG tablet Take 1 tablet (10 mg total) by mouth at bedtime as needed for sleep. 30 tablet 2   No current facility-administered medications on file prior to visit.    BP (!) 147/90 (BP Location: Left Arm, Patient Position: Sitting, Cuff Size: Large)   Pulse 95   Resp 18   Ht 5\' 3"  (1.6 m)   Wt 197 lb (89.4 kg)   LMP 01/08/2013   SpO2 100%   BMI 34.90 kg/m       Objective:   Physical Exam  General Appearance- Not in acute distress.    Chest and Lung Exam Auscultation: Breath sounds:-Normal. Clear even and unlabored. Adventitious sounds:- No Adventitious sounds.  Cardiovascular Auscultation:Rythm - Regular, rate and rythm. Heart Sounds -Normal heart sounds.  Abdomen Inspection:-Inspection Normal.  Palpation/Perucssion: Palpation and Percussion of the abdomen reveal- Non Tender, No Rebound tenderness, No rigidity(Guarding) and No Palpable abdominal masses.  Liver:-Normal.  Spleen:- Normal.   Back Mid  lumbar spine tenderness to palpation. Pain on straight leg lift. Pain on lateral movements and flexion/extension of the spine.  Lower ext neurologic  L5-S1 sensation intact bilaterally. Normal patellar reflexes bilaterally. No foot drop  bilaterally.      Assessment & Plan:  For lower back pain with radiating pain to rt lower ext, will give toradol 60 gm im.  Use you meloxicam, norco,  gabapentin and flexeril.  Might refer you to sports medicine. Just let me know as you might need mri  Today can start low dose prednisone. Then in 5 days restart meloxicam or ibuprofen.  Refilled your norco.  Follow up 7-10 days or as needed  Time spent on chart review and discussing with  patient treatment plans, follow up plan and documentation total time 30 minutes.  Esperanza Richters, PA-C

## 2019-11-01 NOTE — Addendum Note (Signed)
Addended by: Maximino Sarin on: 11/01/2019 04:51 PM   Modules accepted: Orders

## 2019-11-01 NOTE — Patient Instructions (Addendum)
For lower back pain with radiating pain to rt lower ext, will give toradol 60 gm im.  Use you meloxicam, norco,  gabapentin and flexeril.  Might refer you to sports medicine. Just let me know as you might need mri  Today can start low dose prednisone. Then in 5 days restart meloxicam or ibuprofen.  Refilled your norco.  Follow up 7-10 days or as needed

## 2019-11-02 ENCOUNTER — Ambulatory Visit: Payer: 59 | Attending: Internal Medicine

## 2019-11-02 ENCOUNTER — Other Ambulatory Visit (HOSPITAL_COMMUNITY): Payer: Self-pay | Admitting: Psychiatry

## 2019-11-02 ENCOUNTER — Telehealth (HOSPITAL_COMMUNITY): Payer: Self-pay | Admitting: *Deleted

## 2019-11-02 DIAGNOSIS — Z20822 Contact with and (suspected) exposure to covid-19: Secondary | ICD-10-CM

## 2019-11-02 MED ORDER — AMPHETAMINE-DEXTROAMPHET ER 30 MG PO CP24: 30.0000 mg | ORAL_CAPSULE | Freq: Every day | ORAL | 0 refills | Status: DC

## 2019-11-02 NOTE — Telephone Encounter (Signed)
Received a t/c from pt requesting refill of the Adderall XR 30mg  last written 08/15/19. Pt has an appointment on 11/23/19. Please review.

## 2019-11-02 NOTE — Telephone Encounter (Signed)
Done

## 2019-11-03 LAB — SARS-COV-2, NAA 2 DAY TAT

## 2019-11-03 LAB — NOVEL CORONAVIRUS, NAA: SARS-CoV-2, NAA: DETECTED — AB

## 2019-11-04 ENCOUNTER — Telehealth: Payer: Self-pay | Admitting: Physician Assistant

## 2019-11-04 ENCOUNTER — Encounter: Payer: Self-pay | Admitting: Physician Assistant

## 2019-11-04 NOTE — Telephone Encounter (Signed)
Patient is covid positive. See if she wants to have a virtual visit next week to discuss care. Please set that up. Maybe at the end of Tuesday.

## 2019-11-04 NOTE — Telephone Encounter (Signed)
Called to discuss with Nicole Ball about Covid symptoms and the use of bamlanivimab or casirivimab/imdevimab, a monoclonal antibody infusion for those with mild to moderate Covid symptoms and at a high risk of hospitalization.     Pt is qualified for this infusion at the [facility] infusion center due to co-morbid conditions and/or a member of an at-risk group (DMT2 and HTN), would like to think more about it at this time. Symptoms tier reviewed as well as criteria for ending isolation.  Symptoms reviewed that would warrant ED/Hospital evaluation. Preventative practices reviewed. Patient verbalized understanding. Patient advised to call back if he decides that he does want to get infusion. Callback number to the infusion center given. Patient advised to go to Urgent care or ED with severe symptoms. Last date pt would be eligible for infusion is 11/07/19.    Patient Active Problem List   Diagnosis Date Noted  . URI (upper respiratory infection) 08/24/2019  . Panic disorder without agoraphobia 02/25/2019  . 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 Adult ADHD 02/25/2019  . Abdominal pain 01/09/2019  . Suspected COVID-19 virus infection 12/12/2018  . Back pain 11/23/2018  . Stye 05/09/2018  . Vaginitis 05/04/2018  . Obesity (BMI 30-39.9) 01/12/2018  . Headache 10/13/2017  . Diarrhea 10/13/2017  . Myalgia 07/21/2017  . Dry eyes 08/17/2016  . Atrophic vaginitis 08/17/2016  . Urinary urgency 08/17/2016  . Heart palpitations 05/21/2015  . Hypertension 03/30/2015  . Chest pain 03/30/2015  . Palpitations 01/28/2015  . Abnormal serum level of alkaline phosphatase 05/21/2014  . CTS (carpal tunnel syndrome) 01/15/2014  . Preventative health care 01/15/2014  . Cervical cancer screening 01/10/2014  . Migraine 03/07/2013  . Acute sinusitis 08/06/2012  . Knee pain, left 03/11/2012  . Diabetes mellitus type 2 in obese (HCC) 10/30/2011  . Amenorrhea 07/11/2011  . OSA  (obstructive sleep apnea) 06/07/2011  . GAD (generalized anxiety disorder) 06/07/2011  . Hyperlipidemia 04/21/2011  . Allergic state 10/16/2010  . Insomnia 10/16/2010  . SNORING 10/16/2010  . Hiatal hernia with gastroesophageal reflux 10/16/2010  . UTI'S, HX OF 10/16/2010    Cline Crock PA-C

## 2019-11-04 NOTE — Telephone Encounter (Signed)
Left detailed message on machine for patient to call back to let us know if she would like to be scheduled.

## 2019-11-08 ENCOUNTER — Other Ambulatory Visit (HOSPITAL_COMMUNITY): Payer: 59

## 2019-11-08 ENCOUNTER — Telehealth: Payer: Self-pay | Admitting: *Deleted

## 2019-11-08 NOTE — Telephone Encounter (Signed)
patient has No SHOWED Echo x2 Received: Today Message Contents  Loma Sender, LPN  Patient has now no showed echocardiogram x 2.. do you still need me to call patient and reschedule? If ot I will remove from the WQ and make a note in chart..   Thank you,  Thea Alken     Sent Lisa echo scheduler a message to have this pt reschedule her echo, after her recovery from recent COVID-19 virus.  Misty Stanley will call the pt to reschedule.

## 2019-11-12 ENCOUNTER — Other Ambulatory Visit: Payer: Self-pay

## 2019-11-12 ENCOUNTER — Emergency Department (HOSPITAL_COMMUNITY): Payer: 59

## 2019-11-12 ENCOUNTER — Emergency Department (HOSPITAL_COMMUNITY)
Admission: EM | Admit: 2019-11-12 | Discharge: 2019-11-12 | Disposition: A | Discharge status: Home / Self Care | Payer: 59 | Attending: Emergency Medicine | Admitting: Emergency Medicine

## 2019-11-12 ENCOUNTER — Encounter (HOSPITAL_COMMUNITY): Payer: Self-pay

## 2019-11-12 DIAGNOSIS — R519 Headache, unspecified: Secondary | ICD-10-CM | POA: Insufficient documentation

## 2019-11-12 DIAGNOSIS — R0789 Other chest pain: Secondary | ICD-10-CM

## 2019-11-12 DIAGNOSIS — F909 Attention-deficit hyperactivity disorder, unspecified type: Secondary | ICD-10-CM | POA: Diagnosis not present

## 2019-11-12 DIAGNOSIS — R110 Nausea: Secondary | ICD-10-CM | POA: Insufficient documentation

## 2019-11-12 DIAGNOSIS — E119 Type 2 diabetes mellitus without complications: Secondary | ICD-10-CM | POA: Insufficient documentation

## 2019-11-12 DIAGNOSIS — Z720 Tobacco use: Secondary | ICD-10-CM | POA: Diagnosis not present

## 2019-11-12 DIAGNOSIS — Z7984 Long term (current) use of oral hypoglycemic drugs: Secondary | ICD-10-CM | POA: Insufficient documentation

## 2019-11-12 DIAGNOSIS — I10 Essential (primary) hypertension: Secondary | ICD-10-CM | POA: Insufficient documentation

## 2019-11-12 DIAGNOSIS — U071 COVID-19: Secondary | ICD-10-CM | POA: Insufficient documentation

## 2019-11-12 DIAGNOSIS — Z6834 Body mass index (BMI) 34.0-34.9, adult: Secondary | ICD-10-CM | POA: Diagnosis not present

## 2019-11-12 DIAGNOSIS — Z79899 Other long term (current) drug therapy: Secondary | ICD-10-CM | POA: Diagnosis not present

## 2019-11-12 DIAGNOSIS — R748 Abnormal levels of other serum enzymes: Secondary | ICD-10-CM | POA: Diagnosis not present

## 2019-11-12 DIAGNOSIS — E669 Obesity, unspecified: Secondary | ICD-10-CM | POA: Diagnosis not present

## 2019-11-12 LAB — CBC
HCT: 41.1 % (ref 36.0–46.0)
Hemoglobin: 13 g/dL (ref 12.0–15.0)
MCH: 28 pg (ref 26.0–34.0)
MCHC: 31.6 g/dL (ref 30.0–36.0)
MCV: 88.6 fL (ref 80.0–100.0)
Platelets: 249 10*3/uL (ref 150–400)
RBC: 4.64 MIL/uL (ref 3.87–5.11)
RDW: 14.6 % (ref 11.5–15.5)
WBC: 6.2 10*3/uL (ref 4.0–10.5)
nRBC: 0 % (ref 0.0–0.2)

## 2019-11-12 LAB — LIPASE, BLOOD: Lipase: 113 U/L — ABNORMAL HIGH (ref 11–51)

## 2019-11-12 LAB — COMPREHENSIVE METABOLIC PANEL
ALT: 28 U/L (ref 0–44)
AST: 33 U/L (ref 15–41)
Albumin: 3.6 g/dL (ref 3.5–5.0)
Alkaline Phosphatase: 157 U/L — ABNORMAL HIGH (ref 38–126)
Anion gap: 12 (ref 5–15)
BUN: 13 mg/dL (ref 6–20)
CO2: 24 mmol/L (ref 22–32)
Calcium: 9.9 mg/dL (ref 8.9–10.3)
Chloride: 105 mmol/L (ref 98–111)
Creatinine, Ser: 0.92 mg/dL (ref 0.44–1.00)
GFR calc Af Amer: >60 mL/min (ref >60)
GFR calc non Af Amer: >60 mL/min (ref >60)
Glucose, Bld: 116 mg/dL — ABNORMAL HIGH (ref 70–99)
Potassium: 4.5 mmol/L (ref 3.5–5.1)
Sodium: 141 mmol/L (ref 135–145)
Total Bilirubin: 1.1 mg/dL (ref 0.3–1.2)
Total Protein: 7.9 g/dL (ref 6.5–8.1)

## 2019-11-12 LAB — D-DIMER, QUANTITATIVE: D-Dimer, Quant: <0.27 ug/mL-FEU (ref 0.00–0.50)

## 2019-11-12 LAB — TROPONIN I (HIGH SENSITIVITY)
Troponin I (High Sensitivity): 3 ng/L (ref <18)
Troponin I (High Sensitivity): 4 ng/L (ref <18)

## 2019-11-12 MED ORDER — SODIUM CHLORIDE 0.9 % IV BOLUS
500.0000 mL | Freq: Once | INTRAVENOUS | Status: AC
  Administered 2019-11-12: 500 mL via INTRAVENOUS

## 2019-11-12 MED ORDER — SODIUM CHLORIDE 0.9% FLUSH: 3.0000 mL | Freq: Once | INTRAVENOUS | Status: DC

## 2019-11-12 MED ORDER — PROMETHAZINE HCL 25 MG PO TABS: 25.0000 mg | ORAL_TABLET | Freq: Four times a day (QID) | ORAL | 0 refills | Status: DC | PRN

## 2019-11-12 MED ORDER — PROMETHAZINE HCL 25 MG/ML IJ SOLN
12.5000 mg | Freq: Once | INTRAMUSCULAR | Status: AC
  Administered 2019-11-12: 12.5 mg via INTRAVENOUS
  Filled 2019-11-12: qty 1

## 2019-11-12 NOTE — Discharge Instructions (Signed)
As we discussed today your lipase, a number that indicates inflammation on your pancreas is elevated.  Given that you have other liver/pancreas numbers that are high usually it may be that this 1 is also high and this is simply the first time it has been checked.  If it is elevated then this is a condition called pancreatitis.  It is most commonly caused by gallstones or by chronic alcohol use however there are other causes.  The treatment for this is to have a clear liquid diet for the next 2 to 3 days, after that please try to follow the pancreatitis eating plan for one week.    Today your labs and imaging were reassuring.  If you develop worsening symptoms, your chest pain becomes constant or changes or you have any new concerns please seek additional medical care and evaluation.  Today you received medications that may make you sleepy or impair your ability to make decisions.  For the next 24 hours please do not drive, operate heavy machinery, care for a small child with out another adult present, or perform any activities that may cause harm to you or someone else if you were to fall asleep or be impaired.   You are being prescribed a medication which may make you sleepy. Please follow up of listed precautions for at least 24 hours after taking one dose.

## 2019-11-12 NOTE — ED Notes (Signed)
Pt transported to xray 

## 2019-11-12 NOTE — ED Provider Notes (Signed)
[facility] EMERGENCY DEPARTMENT Provider Note   CSN: [id number] Arrival date & time: 11/12/19  1452     History Chief Complaint  Patient presents with  . Chest Pain    Nicole Ball is a 44 y.o. female with past medical history of obesity, DM 2, hyperlipidemia, hypertension, sleep apnea, who presents today for evaluation of chest pain. She tested positive for Covid.  Her symptoms started 14 days ago.  She had presumptive Covid in April 2020, however never had testing and did not have antibody testing, nor did anyone in her household who was in close contact.  He states that at that time she did not lose her sense of taste and smell.  She reports that this time feels different.  She lost her taste and smell, has a headache, has been coughing with nausea, vomiting, and diarrhea.  She has not had a fever in the past 24 hours.  She took 325 mg of Tylenol at 10 AM when she took her home Norco.  She reports that since waking up today she has had intermittent chest pain.  She states that it lasts for about 5 seconds at maximum.  She has 2 different kinds of pain.  There is a dull pain and a sharp pain.  Both are in the lower substernal area/left anterior chest.  She denies any leg swelling.  She does not take any anticoagulants.  She is requesting nausea medicine and medicine for her headache.   HPI     Past Medical History:  Diagnosis Date  . Abnormal serum level of alkaline phosphatase 05/21/2014  . Acute low back pain 12/19/2010  . ALLERGIC RHINITIS 10/16/2010  . Anxiety and depression 06/07/2011  . Atrophic vaginitis 08/17/2016  . Cervical cancer screening 01/10/2014   Menarche at 8 Regular and moderate flow, became irregular until OCPs  history of abnormal pap in past, repeat was normal Last pap roughly 3 years ago G0P0, s/p No history of abnormal MGM, no h/o MGM  No concerns today no gyn surgeries LMP May 2013  . CTS (carpal tunnel syndrome) 01/15/2014  . Diabetes  mellitus type 2 in obese ([place]) 10/30/2011  . Dry eyes 08/17/2016  . Early menopause   . Essential hypertension   . HIATAL HERNIA WITH REFLUX, HX OF 10/16/2010  . Hyperlipidemia 04/21/2011  . Inappropriate sinus tachycardia   . INSOMNIA 10/16/2010  . Knee pain, left 03/11/2012  . Migraine 03/07/2013  . Morbid obesity ([place])   . Palpitations 01/28/2015  . Reflux 04/22/2012  . Sleep apnea 06/07/2011  . Tobacco use disorder 02/10/2017  . Urinary urgency 08/17/2016  . UTI'S, HX OF 10/16/2010    Patient Active Problem List   Diagnosis Date Noted  . URI (upper respiratory infection) 08/24/2019  . Panic disorder without agoraphobia 02/25/2019  . Major depressive disorder, recurrent, mild ([place]) 02/25/2019  . Adult ADHD 02/25/2019  . Abdominal pain 01/09/2019  . Suspected COVID-19 virus infection 12/12/2018  . Back pain 11/23/2018  . Stye 05/09/2018  . Vaginitis 05/04/2018  . Obesity (BMI 30-39.9) 01/12/2018  . Headache 10/13/2017  . Diarrhea 10/13/2017  . Myalgia 07/21/2017  . Dry eyes 08/17/2016  . Atrophic vaginitis 08/17/2016  . Urinary urgency 08/17/2016  . Heart palpitations 05/21/2015  . Hypertension 03/30/2015  . Chest pain 03/30/2015  . Palpitations 01/28/2015  . Abnormal serum level of alkaline phosphatase 05/21/2014  . CTS (carpal tunnel syndrome) 01/15/2014  . Preventative health care 01/15/2014  . Cervical cancer screening  01/10/2014  . Migraine 03/07/2013  . Acute sinusitis 08/06/2012  . Knee pain, left 03/11/2012  . Diabetes mellitus type 2 in obese ([place]) 10/30/2011  . Amenorrhea 07/11/2011  . OSA (obstructive sleep apnea) 06/07/2011  . GAD (generalized anxiety disorder) 06/07/2011  . Hyperlipidemia 04/21/2011  . Allergic state 10/16/2010  . Insomnia 10/16/2010  . SNORING 10/16/2010  . Hiatal hernia with gastroesophageal reflux 10/16/2010  . UTI'S, HX OF 10/16/2010    Past Surgical History:  Procedure Laterality Date  . CHOLECYSTECTOMY       OB History   No  obstetric history on file.     Family History  Problem Relation Age of Onset  . Lupus Mother   . Arthritis Maternal Grandmother   . Scleroderma Maternal Grandmother   . Glaucoma Maternal Grandmother   . Cataracts Maternal Grandmother   . Cancer Maternal Grandfather        prostate  . Coronary artery disease Maternal Grandfather        s/p angioplasty  . Diabetes Maternal Grandfather        Type 2  . Cancer Paternal Grandmother        Breast ca- dx in 79's  . Thyroid disease Paternal Grandmother   . Breast cancer Paternal Grandmother   . Diabetes Paternal Grandfather   . Thyroid disease Sister   . Asthma Sister   . Allergies Brother   . Cancer Father        cigarette, alcohol HEENT squamous cell  . Alcohol abuse Father   . Arthritis Other   . Alcohol abuse Maternal Aunt   . Alcohol abuse Maternal Uncle     Social History   Tobacco Use  . Smoking status: Current Some Day Smoker    Packs/day: 0.10    Types: Cigars  . Smokeless tobacco: Never Used  . Tobacco comment: 1 black and milds  Substance Use Topics  . Alcohol use: Yes    Comment: twice/year  . Drug use: No    Home Medications Prior to Admission medications   Medication Sig Start Date End Date Taking? Authorizing Provider  ACETAMINOPHEN-BUTALBITAL 50-325 MG TABS Take 1 tablet by mouth 2 (two) times daily as needed (HEADACHES). 10/04/19   Mosie Lukes, MD  amphetamine-dextroamphetamine (ADDERALL XR) 30 MG 24 hr capsule Take 1 capsule (30 mg total) by mouth daily. 11/02/19 12/02/19  Pucilowski, Marchia Bond, MD  azelastine (ASTELIN) 0.1 % nasal spray Place 2 sprays into both nostrils 2 (two) times daily. Use in each nostril as directed 10/14/18   Saguier, Percell Miller, PA-C  BAYER MICROLET LANCETS lancets Use as directed once daily to check blood sugar.   DX E11.9 03/20/16   Mosie Lukes, MD  BLACK COHOSH PO Take 1 tablet by mouth daily.    [provider]  Blood Glucose Monitoring Suppl (CONTOUR BLOOD GLUCOSE  SYSTEM) DEVI Use daily to check blood sugar. DX E11.9 09/09/17   Mosie Lukes, MD  conjugated estrogens (PREMARIN) vaginal cream Apply small amount to affected area twice a week 08/23/19   Mosie Lukes, MD  CONTOUR NEXT TEST test strip USE TO CHECK BLOOD SUGAR DAILY 03/11/17   Mosie Lukes, MD  cyclobenzaprine (FLEXERIL) 10 MG tablet Take 1 tablet (10 mg total) by mouth at bedtime. 06/03/19   Mosie Lukes, MD  famotidine (PEPCID) 40 MG tablet Take 1 tablet (40 mg total) by mouth at bedtime. 08/23/19   Mosie Lukes, MD  fluconazole (DIFLUCAN) 150 MG tablet  Take 1 tablet (150 mg total) by mouth as needed. 04/28/19   Saguier, Percell Miller, PA-C  FLUoxetine (PROZAC) 20 MG capsule Take 1 capsule (20 mg total) by mouth 3 (three) times daily. 08/25/19   Pucilowski, Marchia Bond, MD  fluticasone (FLONASE) 50 MCG/ACT nasal spray Place 2 sprays into both nostrils daily. 04/28/19   Saguier, Percell Miller, PA-C  gabapentin (NEURONTIN) 100 MG capsule TAKE 1 CAPSULE(100 MG) BY MOUTH AT BEDTIME 09/20/19   Mosie Lukes, MD  gabapentin (NEURONTIN) 100 MG capsule Take 100 mg by mouth daily.    [provider]  gabapentin (NEURONTIN) 300 MG capsule TAKE 1 CAPSULE BY MOUTH TWICE DAILY AND 2 CAPSULES BY MOUTH AT BEDTIME 08/19/19   Mosie Lukes, MD  Ginger, Zingiber officinalis, (GINGER PO) Take 1 tablet by mouth daily.    [provider]  HYDROcodone-acetaminophen (NORCO/VICODIN) 5-325 MG tablet Take 1 tablet by mouth every 6 (six) hours as needed. Patient needs appointment for further refills. 11/01/19   Saguier, Percell Miller, PA-C  hyoscyamine (LEVSIN SL) 0.125 MG SL tablet Take 1 tablet (0.125 mg total) by mouth every 4 (four) hours as needed. 06/03/19   Mosie Lukes, MD  levocetirizine (XYZAL) 5 MG tablet TAKE 1 TABLET(5 MG) BY MOUTH EVERY EVENING 09/14/19   Mosie Lukes, MD  losartan (COZAAR) 50 MG tablet TAKE 1 TABLET(50 MG) BY MOUTH DAILY 07/01/19   Dorothy Spark, MD  meloxicam (MOBIC) 15 MG tablet  Take 1 tablet (15 mg total) by mouth daily as needed for pain. 01/14/18   Mosie Lukes, MD  metFORMIN (GLUCOPHAGE) 500 MG tablet TAKE 2 TABLETS BY MOUTH TWICE DAILY 09/14/19   Mosie Lukes, MD  montelukast (SINGULAIR) 10 MG tablet TAKE 1 TABLET(10 MG) BY MOUTH AT BEDTIME 06/06/19   Mosie Lukes, MD  omeprazole (PRILOSEC) 20 MG capsule Take 1 capsule (20 mg total) by mouth 2 (two) times daily before a meal. 06/21/19   Mosie Lukes, MD  predniSONE (DELTASONE) 10 MG tablet 4 tab po day 1, 3 tab po day 2, 2 tab po day 3, 1 tab po day 4. 11/01/19   Saguier, Percell Miller, PA-C  Probiotic Product (PROBIOTIC PO) Take 1 tablet by mouth daily.    [provider]  promethazine (PHENERGAN) 25 MG tablet Take 1 tablet (25 mg total) by mouth every 6 (six) hours as needed for nausea or vomiting. 11/12/19   Lorin Glass, PA-C  propranolol (INDERAL) 60 MG tablet Take 1 tablet (60 mg total) by mouth 2 (two) times daily. 09/16/19   Dorothy Spark, MD  rosuvastatin (CRESTOR) 5 MG tablet Take 1 tablet (5 mg total) by mouth 3 (three) times a week. Patient not taking: Reported on 09/16/2019 12/10/18   Dorothy Spark, MD  sodium chloride (OCEAN) 0.65 % SOLN nasal spray Place 1 spray into both nostrils daily.    [provider]  SUMAtriptan (IMITREX) 50 MG tablet Take 1 tablet (50 mg total) by mouth every 2 (two) hours as needed for migraine. May repeat in 2 hours if headache persists or recurs. 03/24/19   Mosie Lukes, MD  VITAMIN D PO Take 2,000 Units by mouth daily.    [provider]  zolpidem (AMBIEN) 10 MG tablet Take 1 tablet (10 mg total) by mouth at bedtime as needed for sleep. 07/30/19 10/28/19  Pucilowski, Marchia Bond, MD    Allergies    Patient has no known allergies.  Review of Systems   Review  of Systems  Constitutional: Negative for chills and fever.  Eyes: Negative for visual disturbance.  Respiratory: Negative for cough and shortness of breath.   Cardiovascular:  Positive for chest pain. Negative for palpitations and leg swelling.  Gastrointestinal: Positive for nausea. Negative for abdominal pain, diarrhea and vomiting.  Neurological: Positive for headaches.  All other systems reviewed and are negative.   Physical Exam Updated Vital Signs BP (!) 149/110   Pulse 85   Temp 98.5 F (36.9 C) (Oral)   Resp 14   Ht 5' 3"  (1.6 m)   Wt 88.5 kg   LMP 01/08/2013   SpO2 98%   BMI 34.54 kg/m   Physical Exam Vitals and nursing note reviewed.  Constitutional:      General: She is not in acute distress.    Appearance: She is well-developed.  HENT:     Head: Normocephalic and atraumatic.  Eyes:     Conjunctiva/sclera: Conjunctivae normal.  Neck:     Vascular: No JVD.  Cardiovascular:     Rate and Rhythm: Normal rate and regular rhythm.     Heart sounds: Normal heart sounds. No murmur.  Pulmonary:     Effort: Pulmonary effort is normal. No respiratory distress.     Breath sounds: Normal breath sounds.  Chest:     Chest wall: Tenderness present. No deformity or edema.  Abdominal:     Palpations: Abdomen is soft.     Tenderness: There is no abdominal tenderness.  Musculoskeletal:     Cervical back: Normal range of motion and neck supple.     Right lower leg: No tenderness. No edema.     Left lower leg: No tenderness. No edema.  Skin:    General: Skin is warm and dry.  Neurological:     General: No focal deficit present.     Mental Status: She is alert.  Psychiatric:        Mood and Affect: Mood normal.        Behavior: Behavior normal.     ED Results / Procedures / Treatments   Labs (all labs ordered are listed, but only abnormal results are displayed) Labs Reviewed  COMPREHENSIVE METABOLIC PANEL - Abnormal; Notable for the following components:      Result Value   Glucose, Bld 116 (*)    Alkaline Phosphatase 157 (*)    All other components within normal limits  LIPASE, BLOOD - Abnormal; Notable for the following components:    Lipase 113 (*)    All other components within normal limits  CBC  D-DIMER, QUANTITATIVE (NOT AT [facility])  TROPONIN I (HIGH SENSITIVITY)  TROPONIN I (HIGH SENSITIVITY)    EKG EKG Interpretation  Date/Time:  Saturday November 12 2019 15:05:26 EDT Ventricular Rate:  78 PR Interval:  136 QRS Duration: 104 QT Interval:  388 QTC Calculation: 442 R Axis:   3 Text Interpretation: Normal sinus rhythm Incomplete right bundle branch block Nonspecific T wave abnormality Abnormal ECG Confirmed by Virgel Manifold [phone]) on 11/12/2019 3:50:59 PM   Radiology DG Chest 2 View  Result Date: 11/12/2019 CLINICAL DATA:  Chest pain, shortness of breath, COVID EXAM: CHEST - 2 VIEW COMPARISON:  None. FINDINGS: The heart size and mediastinal contours are within normal limits. Subtle bibasilar heterogeneous airspace opacity. The visualized skeletal structures are unremarkable. IMPRESSION: Subtle bibasilar heterogeneous airspace opacity, in keeping with reported COVID infection. Electronically Signed   By: Eddie Candle M.D.   On: 11/12/2019 16:15    Procedures Procedures (including  critical care time)  Medications Ordered in ED Medications  promethazine (PHENERGAN) injection 12.5 mg (12.5 mg Intravenous Given 11/12/19 1638)  sodium chloride 0.9 % bolus 500 mL (0 mLs Intravenous Stopped 11/12/19 1742)    ED Course  I have reviewed the triage vital signs and the nursing notes.  Pertinent labs & imaging results that were available during my care of the patient were reviewed by me and considered in my medical decision making (see chart for details).  Clinical Course as of Nov 13 2315  Sat Nov 12, 2019  1549 Patient not in room.     [EH]    Clinical Course User Index [EH] Ollen Gross   MDM Rules/Calculators/A&P                     Patient is a 44 year old woman who presents today for evaluation to the intermittent chest pain.  She was diagnosed with COVID-19 over 2 weeks ago.  She has been  getting brief chest pains that last about under 5 seconds on the left side of her chest with increasing frequency today. She also reports headache and nausea.  Troponin x2 is not elevated.  CMP with elevated alk phos consistent with her baseline.  CBC is unremarkable.  No prior for comparison however her pain is not sound consistent with pancreatitis.   Do not suspect PE or ACS.  Suspect musculoskeletal pain versus precordial catch syndrome. Did recommend based on her nausea that she have a clear liquid diet followed by pancreatitis eating plan to monitor for improvement in her symptoms.  She was treated with Phenergan here with relief of both her headache and her nausea.  She is given a prescription for Phenergan.  Note: Portions of this report may have been transcribed using voice recognition software. Every effort was made to ensure accuracy; however, inadvertent computerized transcription errors may be present   Final Clinical Impression(s) / ED Diagnoses Final diagnoses:  COVID-19  Atypical chest pain  Nausea  Elevated lipase    Rx / DC Orders ED Discharge Orders         Ordered    promethazine (PHENERGAN) 25 MG tablet  Every 6 hours PRN     11/12/19 1952           Ollen Gross 11/13/19 2331    Virgel Manifold, MD 11/14/19 [phone]

## 2019-11-12 NOTE — ED Triage Notes (Signed)
Onset today 10am chest pain in center of chest, lasting few seconds and occurring every hour.  Is + COVID.  Denies shortness of breath or cough.  NAD noted at triage.

## 2019-11-12 NOTE — ED Notes (Signed)
Pt verbalized understanding of DC instructions and Rx.  

## 2019-11-23 ENCOUNTER — Ambulatory Visit (HOSPITAL_COMMUNITY): Payer: 59 | Admitting: Psychiatry

## 2019-11-23 ENCOUNTER — Other Ambulatory Visit (HOSPITAL_COMMUNITY): Payer: Self-pay | Admitting: Psychiatry

## 2019-11-23 ENCOUNTER — Other Ambulatory Visit: Payer: Self-pay

## 2019-11-23 MED ORDER — BUSPIRONE HCL 15 MG PO TABS: 15.0000 mg | ORAL_TABLET | Freq: Two times a day (BID) | ORAL | 1 refills | Status: AC

## 2019-11-23 MED ORDER — AMPHETAMINE-DEXTROAMPHET ER 30 MG PO CP24: 30.0000 mg | ORAL_CAPSULE | Freq: Every day | ORAL | 0 refills | Status: DC

## 2019-11-23 MED ORDER — ZOLPIDEM TARTRATE 10 MG PO TABS: 10.0000 mg | ORAL_TABLET | Freq: Every evening | ORAL | 2 refills | Status: DC | PRN

## 2019-11-23 MED ORDER — ALPRAZOLAM 1 MG PO TABS: 1.0000 mg | ORAL_TABLET | Freq: Two times a day (BID) | ORAL | 1 refills | Status: AC | PRN

## 2019-12-02 ENCOUNTER — Other Ambulatory Visit (HOSPITAL_COMMUNITY): Payer: 59

## 2019-12-03 ENCOUNTER — Other Ambulatory Visit: Payer: Self-pay | Admitting: Medical

## 2019-12-03 ENCOUNTER — Encounter: Payer: Self-pay | Admitting: Family Medicine

## 2019-12-03 DIAGNOSIS — M25562 Pain in left knee: Secondary | ICD-10-CM

## 2019-12-05 ENCOUNTER — Other Ambulatory Visit: Payer: Self-pay | Admitting: Family Medicine

## 2019-12-05 MED ORDER — HYDROCODONE-ACETAMINOPHEN 5-325 MG PO TABS: 1.0000 | ORAL_TABLET | Freq: Four times a day (QID) | ORAL | 0 refills | Status: DC | PRN

## 2019-12-05 MED ORDER — ONDANSETRON HCL 4 MG PO TABS: 4.0000 mg | ORAL_TABLET | Freq: Three times a day (TID) | ORAL | 0 refills | Status: DC | PRN

## 2019-12-15 ENCOUNTER — Telehealth: Payer: Self-pay | Admitting: *Deleted

## 2019-12-15 ENCOUNTER — Other Ambulatory Visit: Payer: Self-pay | Admitting: Family Medicine

## 2019-12-15 MED ORDER — MELOXICAM 15 MG PO TABS: 15.0000 mg | ORAL_TABLET | Freq: Every day | ORAL | 1 refills | Status: DC | PRN

## 2019-12-15 NOTE — Telephone Encounter (Signed)
Pharmacy requesting refill on meloxicam.  Last time we refilled was 01/14/18 and last fill at pharmacy was 11/13/18.  She saw Ramon Dredge on 11/01/19 and was advised use meloxicam along with other meds for back pain.   Should she be seen again before refill or give another refill?

## 2019-12-15 NOTE — Telephone Encounter (Signed)
Refill sent in

## 2019-12-20 ENCOUNTER — Other Ambulatory Visit (HOSPITAL_COMMUNITY): Payer: 59

## 2019-12-23 ENCOUNTER — Ambulatory Visit: Payer: 59 | Admitting: Medical

## 2019-12-23 ENCOUNTER — Other Ambulatory Visit: Payer: Self-pay

## 2019-12-23 VITALS — BP 146/93 | HR 93 | Resp 18 | Ht 63.0 in | Wt 195.6 lb

## 2019-12-23 DIAGNOSIS — M5410 Radiculopathy, site unspecified: Secondary | ICD-10-CM | POA: Diagnosis not present

## 2019-12-23 DIAGNOSIS — E1169 Type 2 diabetes mellitus with other specified complication: Secondary | ICD-10-CM | POA: Diagnosis not present

## 2019-12-23 DIAGNOSIS — M549 Dorsalgia, unspecified: Secondary | ICD-10-CM

## 2019-12-23 DIAGNOSIS — E669 Obesity, unspecified: Secondary | ICD-10-CM | POA: Diagnosis not present

## 2019-12-23 MED ORDER — KETOROLAC TROMETHAMINE 60 MG/2ML IM SOLN
60.0000 mg | Freq: Once | INTRAMUSCULAR | Status: AC
  Administered 2019-12-23: 60 mg via INTRAMUSCULAR

## 2019-12-23 MED ORDER — GABAPENTIN 600 MG PO TABS: 600.0000 mg | ORAL_TABLET | Freq: Three times a day (TID) | ORAL | 0 refills | Status: DC

## 2019-12-23 NOTE — Patient Instructions (Addendum)
Your back pain is still severe with radicular pain. Will refer to sport medicine. They may think you need mri.  For back pain can continue ibuprofen(but hold until tomorrow), flexeril and increase gabapentin to 600 mg tid. Will send note to Dr. Abner Greenspan and see if she want to change you to oxy.(will let you know)  Work note to work from home next 10-14 days so you can get accustomed to high dose gabapentin.  We toradol im.  Follow up in 2 weeks or as needed

## 2019-12-23 NOTE — Progress Notes (Signed)
   Subjective:    Patient ID: Nicole Ball, female    DOB: [birth year], 44 y.o.   MRN: [id number]  HPI  Pt in for recurrent back pain as before. She is still having back and rt lower ext pain. Pain in rt leg is worse. Constant dull pain in leg with sharpness as well. She states late March when I saw her she vomited prednisone tablets. Many did eventually decrease but then worsening again over past 2 weeks.  Last visit hpi  Pt in for evaluation. Pt has had some back pain with some pain down her rt leg. She states off and on for 6 weeks her legs feel like walking waits. Her lower back is source of pain. Pain has been present since at least 07-2018 per pt. Xray back then showed below.  IMPRESSION: Moderate disc space narrowing at L4-5 and L5-S1. Other disc spaces appear unremarkable. No fracture or spondylolisthesis.  Pt states recent low back spasming and has some pain radiating to rt lower ext.  Pt has been taking ibuprfone, hydrocodone, gabapentin and flexeril.   S  Pt has never seen specialist.   Since last visit she states  She self increase  gabapentin to 300 mg am. 600 mg at lunch and 600 mg at night.   Pt is using 2 tab of norco at times. Pt did use some of her husband pain med oxy when that failed.     Review of Systems  Constitutional: Negative for chills, fatigue and fever.  Respiratory: Negative for chest tightness, shortness of breath and wheezing.   Cardiovascular: Negative for chest pain and palpitations.  Gastrointestinal: Negative for abdominal pain.  Genitourinary: Negative for difficulty urinating, dysuria and urgency.  Musculoskeletal: Positive for back pain.  Neurological:       Radicular rt lower ext pain.  Hematological: Negative for adenopathy. Does not bruise/bleed easily.       Objective:   Physical Exam  General Appearance- Not in acute distress.    Chest and Lung Exam Auscultation: Breath sounds:-Normal. Clear even and  unlabored. Adventitious sounds:- No Adventitious sounds.  Cardiovascular Auscultation:Rythm - Regular, rate and rythm. Heart Sounds -Normal heart sounds.  Abdomen Inspection:-Inspection Normal.  Palpation/Perucssion: Palpation and Percussion of the abdomen reveal- Non Tender, No Rebound tenderness, No rigidity(Guarding) and No Palpable abdominal masses.  Liver:-Normal.  Spleen:- Normal.   Back Mid lumbar spine tenderness to palpation. Rt si tenderness. Pain on lateral movements and flexion/extension of the spine.  Lower ext neurologic  L5-S1 sensation intact bilaterally. Normal patellar reflexes bilaterally. No foot drop bilaterally.      Assessment & Plan:  Your back pain is still severe with radicular pain. Will refer to sport medicine. They may think you need mri.  For back pain can continue ibuprofen(but hold until tomorrow), flexeril and increase gabapentin to 600 mg tid. Will send note to Dr. Abner Greenspan and see if she want to change you to oxy.(will let you know)  Work note to work from home next 10-14 days so you can get accustomed to high dose gabapentin.  We toradol  Im.  Follow up in 2 weeks or as needed   Time spent with patient today was 35  minutes which consisted of chart rediew, discussing diagnosis, work up, referral treatment, answering questions and documentation. Esperanza Richters, PA-C

## 2019-12-26 ENCOUNTER — Telehealth: Payer: Self-pay | Admitting: Family Medicine

## 2019-12-26 ENCOUNTER — Encounter: Payer: Self-pay | Admitting: Family Medicine

## 2019-12-26 ENCOUNTER — Ambulatory Visit: Payer: 59 | Admitting: Family Medicine

## 2019-12-26 ENCOUNTER — Other Ambulatory Visit: Payer: Self-pay

## 2019-12-26 VITALS — BP 152/109 | HR 73 | Ht 63.0 in | Wt 195.0 lb

## 2019-12-26 DIAGNOSIS — M5416 Radiculopathy, lumbar region: Secondary | ICD-10-CM | POA: Diagnosis not present

## 2019-12-26 MED ORDER — BACLOFEN 10 MG PO TABS: 10.0000 mg | ORAL_TABLET | Freq: Two times a day (BID) | ORAL | 2 refills | Status: DC | PRN

## 2019-12-26 MED ORDER — CELECOXIB 200 MG PO CAPS: ORAL_CAPSULE | ORAL | 2 refills | Status: DC

## 2019-12-26 NOTE — Patient Instructions (Signed)
Nice to meet you Please try the exercises  Please stop the mobic and flexeril  Please start the celebrex and baclofen   Please send me a message in MyChart with any questions or updates.  We will set up a virtual visit once the MRI is resulted.   --Dr. Jordan Likes

## 2019-12-26 NOTE — Progress Notes (Signed)
[name] - 44 y.o. female MRN [id number]  Date of birth: [birth year]  SUBJECTIVE:  Including CC & ROS.  Chief Complaint  Patient presents with  . Back Pain    bilateral low back    Nicole Ball is a 44 y.o. female that is presenting with acute on chronic right leg pain.  She has been experiencing this pain for several years.  Seem to gotten worse over the past year.  She has been struggling with that.  She has been taking different medications.  No specific inciting event or trauma.  The pain seems to be severe.  It occurs on a daily basis with no improvement..  Independent review of the lumbar spine x-rays from 08/03/2018 shows mild loss of lumbar lordosis.  Also showing some disc narrowing at L4-5 and L5-S1. Independent review of the AP pelvis and right hip x-ray from 08/03/18.  Shows no significant degenerative changes.  Review of Systems See HPI   HISTORY: Past Medical, Surgical, Social, and Family History Reviewed & Updated per EMR.   Pertinent Historical Findings include:  Past Medical History:  Diagnosis Date  . Abnormal serum level of alkaline phosphatase 05/21/2014  . Acute low back pain 12/19/2010  . ALLERGIC RHINITIS 10/16/2010  . Anxiety and depression 06/07/2011  . Atrophic vaginitis 08/17/2016  . Cervical cancer screening 01/10/2014   Menarche at 8 Regular and moderate flow, became irregular until OCPs  history of abnormal pap in past, repeat was normal Last pap roughly 3 years ago G0P0, s/p No history of abnormal MGM, no h/o MGM  No concerns today no gyn surgeries LMP May 2013  . CTS (carpal tunnel syndrome) 01/15/2014  . Diabetes mellitus type 2 in obese (HCC) 10/30/2011  . Dry eyes 08/17/2016  . Early menopause   . Essential hypertension   . HIATAL HERNIA WITH REFLUX, HX OF 10/16/2010  . Hyperlipidemia 04/21/2011  . Inappropriate sinus tachycardia   . INSOMNIA 10/16/2010  . Knee pain, left 03/11/2012  . Migraine 03/07/2013  . Morbid obesity (HCC)   . Palpitations  01/28/2015  . Reflux 04/22/2012  . Sleep apnea 06/07/2011  . Tobacco use disorder 02/10/2017  . Urinary urgency 08/17/2016  . UTI'S, HX OF 10/16/2010    Past Surgical History:  Procedure Laterality Date  . CHOLECYSTECTOMY      Family History  Problem Relation Age of Onset  . Lupus Mother   . Arthritis Maternal Grandmother   . Scleroderma Maternal Grandmother   . Glaucoma Maternal Grandmother   . Cataracts Maternal Grandmother   . Cancer Maternal Grandfather        prostate  . Coronary artery disease Maternal Grandfather        s/p angioplasty  . Diabetes Maternal Grandfather        Type 2  . Cancer Paternal Grandmother        Breast ca- dx in 33's  . Thyroid disease Paternal Grandmother   . Breast cancer Paternal Grandmother   . Diabetes Paternal Grandfather   . Thyroid disease Sister   . Asthma Sister   . Allergies Brother   . Cancer Father        cigarette, alcohol HEENT squamous cell  . Alcohol abuse Father   . Arthritis Other   . Alcohol abuse Maternal Aunt   . Alcohol abuse Maternal Uncle     Social History   Socioeconomic History  . Marital status: Married    Spouse name: Not on file  . Number  of children: Not on file  . Years of education: Not on file  . Highest education level: Not on file  Occupational History  . Not on file  Tobacco Use  . Smoking status: Current Some Day Smoker    Packs/day: 0.10    Types: Cigars  . Smokeless tobacco: Never Used  . Tobacco comment: 1 black and milds  Substance and Sexual Activity  . Alcohol use: Yes    Comment: twice/year  . Drug use: No  . Sexual activity: Not on file  Other Topics Concern  . Not on file  Social History Narrative   Married lives w/ husband, works at [employer] Strain:   . Difficulty of Paying Living Expenses:   Food Insecurity:   . Worried About Charity fundraiser in the Last Year:   . Arboriculturist in the Last Year:     Transportation Needs:   . Film/video editor (Medical):   Marland Kitchen Lack of Transportation (Non-Medical):   Physical Activity:   . Days of Exercise per Week:   . Minutes of Exercise per Session:   Stress:   . Feeling of Stress :   Social Connections:   . Frequency of Communication with Friends and Family:   . Frequency of Social Gatherings with Friends and Family:   . Attends Religious Services:   . Active Member of Clubs or Organizations:   . Attends Archivist Meetings:   Marland Kitchen Marital Status:   Intimate Partner Violence:   . Fear of Current or Ex-Partner:   . Emotionally Abused:   Marland Kitchen Physically Abused:   . Sexually Abused:      PHYSICAL EXAM:  VS: BP (!) 152/109   Pulse 73   Ht 5\' 3"  (1.6 m)   Wt 195 lb (88.5 kg)   LMP 01/08/2013   BMI 34.54 kg/m  Physical Exam Gen: NAD, alert, cooperative with exam, well-appearing MSK:  Back: Significant instability with 1 leg standing as well as hip flexion and abduction. Pes planus. Normal strength resistance with hip flexion. Positive straight leg raise. Neurovascularly intact     ASSESSMENT & PLAN:   Lumbar radiculopathy Acute on chronic in nature.  Seems more radicular at this time. -Counseled on home exercise therapy and supportive care. -Stop Advil and meloxicam and start Celebrex. -Stop Flexeril and initiate baclofen. -May need to decrease gabapentin if he becomes too sedating -MRI to evaluate for nerve impingement in the consideration of epidural use.

## 2019-12-26 NOTE — Telephone Encounter (Signed)
Patient came in regarding , upset stomach , that her husband is also having stomach issues "Sour stomach " ,  Stated . she wanted to inform dr. Abner Greenspan of her issues . Please advise .

## 2019-12-26 NOTE — Assessment & Plan Note (Signed)
Acute on chronic in nature.  Seems more radicular at this time. -Counseled on home exercise therapy and supportive care. -Stop Advil and meloxicam and start Celebrex. -Stop Flexeril and initiate baclofen. -May need to decrease gabapentin if he becomes too sedating -MRI to evaluate for nerve impingement in the consideration of epidural use.

## 2019-12-26 NOTE — Telephone Encounter (Signed)
I could do a virtual visit with her on Wednesday morning if she would like. I definitely need to know more to assess

## 2019-12-27 NOTE — Telephone Encounter (Signed)
Spoke with patient and she stated that her stomach was ok.  She would like to ask Dr. Abner Greenspan about her pain medication Hydrocodone.  She has been having to use more (taking 2 tabs instead of 1 tab) for pain.  She would like to see if her dose can be raised.  Per Dr. Abner Greenspan can make appt for tomorrow to discuss.  Appointment made.

## 2019-12-28 ENCOUNTER — Other Ambulatory Visit: Payer: Self-pay

## 2019-12-28 ENCOUNTER — Telehealth (INDEPENDENT_AMBULATORY_CARE_PROVIDER_SITE_OTHER): Payer: 59 | Admitting: Family Medicine

## 2019-12-28 DIAGNOSIS — G4700 Insomnia, unspecified: Secondary | ICD-10-CM

## 2019-12-28 DIAGNOSIS — I10 Essential (primary) hypertension: Secondary | ICD-10-CM | POA: Diagnosis not present

## 2019-12-28 DIAGNOSIS — E1169 Type 2 diabetes mellitus with other specified complication: Secondary | ICD-10-CM

## 2019-12-28 DIAGNOSIS — E669 Obesity, unspecified: Secondary | ICD-10-CM

## 2019-12-28 DIAGNOSIS — F411 Generalized anxiety disorder: Secondary | ICD-10-CM

## 2019-12-28 DIAGNOSIS — M549 Dorsalgia, unspecified: Secondary | ICD-10-CM | POA: Insufficient documentation

## 2019-12-28 DIAGNOSIS — M5441 Lumbago with sciatica, right side: Secondary | ICD-10-CM

## 2019-12-28 MED ORDER — HYDROCODONE-ACETAMINOPHEN 7.5-325 MG PO TABS: 1.0000 | ORAL_TABLET | Freq: Four times a day (QID) | ORAL | 0 refills | Status: DC | PRN

## 2019-12-28 MED ORDER — METHYLPREDNISOLONE 4 MG PO TABS: ORAL_TABLET | ORAL | 0 refills | Status: DC

## 2019-12-28 MED ORDER — SUMATRIPTAN SUCCINATE 50 MG PO TABS: 50.0000 mg | ORAL_TABLET | ORAL | 3 refills | Status: DC | PRN

## 2019-12-28 NOTE — Assessment & Plan Note (Signed)
Monitor and report any concerns. no changes to meds. Encouraged heart healthy diet such as the DASH diet and exercise as tolerated.  

## 2019-12-28 NOTE — Assessment & Plan Note (Signed)
She is now seeing sports medicine and awaits an MRI to further investigate the worsening pain she is experiencing. She will continue Baclofen, Gabapentin, Acetaminophen and we will allow Hdrocodone APAP 7.5/325 1 tab po qid prn. Encouraged to try and minimize dosing use tid as able. Reassess next week. Encouraged to continue topical treatments and alternate ice and heat. Counseled for 40 minutes regarding activity, treatment options and the risks and benefits of treating pain with medications.

## 2019-12-28 NOTE — Assessment & Plan Note (Signed)
She has only taken a couple of doses of Ambien in the past month. She is encouraged to avoid taking it at this time as she treats her pain

## 2019-12-28 NOTE — Assessment & Plan Note (Signed)
hgba1c acceptable, minimize simple carbs. Increase exercise as tolerated. Continue current meds 

## 2019-12-28 NOTE — Assessment & Plan Note (Signed)
She is taking Buspar just once a day and has decreased her Alprazolam dosing. She uses only 1 tab many days. She is asked to minimize use and reminded she cannot use Hydrocodone together or the risk of complications increases dramatically. She acknowledges understanding of the seriousness.

## 2019-12-28 NOTE — Progress Notes (Signed)
Virtual Visit via Video Note  I connected with [name] on 12/28/19 at  9:00 AM EDT by a video enabled telemedicine application and verified that I am speaking with the correct person using two identifiers.  Location: Patient: home, patient is in visit Provider: home, physician is in visit   I discussed the limitations of evaluation and management by telemedicine and the availability of in person appointments. The patient expressed understanding and agreed to proceed. Kem Boroughs, CMA was able to set the patient up with a video visit   Subjective:    Patient ID: Nicole Ball, female    DOB: 1976/01/11, 44 y.o.   MRN: [id number]  Chief Complaint  Patient presents with  . discuss in pain medication    HPI Patient is in today for evaluation of worsening back pain and chronic medical concerns. She is in pain and tearful. She has been seen by sports medicine and is awaiting an MRI in June to further evaluate her worsening low back pain and radicular pain down right leg. No recent fall or trauma. She has continued to work but her pain is getting more unbearable. She has trouble sleeping and getting comfortable. She does not report worsening incontinence. She notes the pain is flaring her anxiety. Denies CP/palp/SOB/HA/congestion/fevers/GI or GU c/o. Taking meds as prescribed  Past Medical History:  Diagnosis Date  . Abnormal serum level of alkaline phosphatase 05/21/2014  . Acute low back pain 12/19/2010  . ALLERGIC RHINITIS 10/16/2010  . Anxiety and depression 06/07/2011  . Atrophic vaginitis 08/17/2016  . Cervical cancer screening 01/10/2014   Menarche at 8 Regular and moderate flow, became irregular until OCPs  history of abnormal pap in past, repeat was normal Last pap roughly 3 years ago G0P0, s/p No history of abnormal MGM, no h/o MGM  No concerns today no gyn surgeries LMP May 2013  . CTS (carpal tunnel syndrome) 01/15/2014  . Diabetes mellitus type 2 in obese ([place])  10/30/2011  . Dry eyes 08/17/2016  . Early menopause   . Essential hypertension   . HIATAL HERNIA WITH REFLUX, HX OF 10/16/2010  . Hyperlipidemia 04/21/2011  . Inappropriate sinus tachycardia   . INSOMNIA 10/16/2010  . Knee pain, left 03/11/2012  . Migraine 03/07/2013  . Morbid obesity ([place])   . Palpitations 01/28/2015  . Reflux 04/22/2012  . Sleep apnea 06/07/2011  . Tobacco use disorder 02/10/2017  . Urinary urgency 08/17/2016  . UTI'S, HX OF 10/16/2010    Past Surgical History:  Procedure Laterality Date  . CHOLECYSTECTOMY      Family History  Problem Relation Age of Onset  . Lupus Mother   . Arthritis Maternal Grandmother   . Scleroderma Maternal Grandmother   . Glaucoma Maternal Grandmother   . Cataracts Maternal Grandmother   . Cancer Maternal Grandfather        prostate  . Coronary artery disease Maternal Grandfather        s/p angioplasty  . Diabetes Maternal Grandfather        Type 2  . Cancer Paternal Grandmother        Breast ca- dx in 42's  . Thyroid disease Paternal Grandmother   . Breast cancer Paternal Grandmother   . Diabetes Paternal Grandfather   . Thyroid disease Sister   . Asthma Sister   . Allergies Brother   . Cancer Father        cigarette, alcohol HEENT squamous cell  . Alcohol abuse Father   . Arthritis  Other   . Alcohol abuse Maternal Aunt   . Alcohol abuse Maternal Uncle     Social History   Socioeconomic History  . Marital status: Married    Spouse name: Not on file  . Number of children: Not on file  . Years of education: Not on file  . Highest education level: Not on file  Occupational History  . Not on file  Tobacco Use  . Smoking status: Current Some Day Smoker    Packs/day: 0.10    Types: Cigars  . Smokeless tobacco: Never Used  . Tobacco comment: 1 black and milds  Substance and Sexual Activity  . Alcohol use: Yes    Comment: twice/year  . Drug use: No  . Sexual activity: Not on file  Other Topics Concern  . Not on file    Social History Narrative   Married lives w/ husband, works at [employer]   Social Determinants of Corporate investment bankerHealth   Financial Resource Strain:   . Difficulty of Paying Living Expenses:   Food Insecurity:   . Worried About Programme researcher, broadcasting/film/videounning Out of Food in the Last Year:   . Baristaan Out of Food in the Last Year:   Transportation Needs:   . Freight forwarderLack of Transportation (Medical):   Marland Kitchen. Lack of Transportation (Non-Medical):   Physical Activity:   . Days of Exercise per Week:   . Minutes of Exercise per Session:   Stress:   . Feeling of Stress :   Social Connections:   . Frequency of Communication with Friends and Family:   . Frequency of Social Gatherings with Friends and Family:   . Attends Religious Services:   . Active Member of Clubs or Organizations:   . Attends BankerClub or Organization Meetings:   Marland Kitchen. Marital Status:   Intimate Partner Violence:   . Fear of Current or Ex-Partner:   . Emotionally Abused:   Marland Kitchen. Physically Abused:   . Sexually Abused:     Outpatient Medications Prior to Visit  Medication Sig Dispense Refill  . ACETAMINOPHEN-BUTALBITAL 50-325 MG TABS Take 1 tablet by mouth 2 (two) times daily as needed (HEADACHES). 60 tablet 0  . ALPRAZolam (XANAX) 1 MG tablet Take 1 tablet (1 mg total) by mouth 2 (two) times daily as needed. 60 tablet 1  . amphetamine-dextroamphetamine (ADDERALL XR) 30 MG 24 hr capsule Take 1 capsule (30 mg total) by mouth daily. 30 capsule 0  . baclofen (LIORESAL) 10 MG tablet Take 1 tablet (10 mg total) by mouth 2 (two) times daily as needed for muscle spasms. 30 each 2  . BLACK COHOSH PO Take 1 tablet by mouth daily.    . busPIRone (BUSPAR) 15 MG tablet Take 1 tablet (15 mg total) by mouth 2 (two) times daily. 60 tablet 1  . celecoxib (CELEBREX) 200 MG capsule One to 2 tablets by mouth daily as needed for pain. 60 capsule 2  . conjugated estrogens (PREMARIN) vaginal cream Apply small amount to affected area twice a week 42.5 g 1  . famotidine (PEPCID) 40 MG tablet Take 1  tablet (40 mg total) by mouth at bedtime. 30 tablet 5  . FLUoxetine (PROZAC) 20 MG capsule Take 1 capsule (20 mg total) by mouth 3 (three) times daily. 90 capsule 5  . fluticasone (FLONASE) 50 MCG/ACT nasal spray Place 2 sprays into both nostrils daily. 16 g 1  . gabapentin (NEURONTIN) 600 MG tablet Take 1 tablet (600 mg total) by mouth 3 (three) times daily. 90 tablet 0  .  Ginger, Zingiber officinalis, (GINGER PO) Take 1 tablet by mouth daily.    . hyoscyamine (LEVSIN SL) 0.125 MG SL tablet Take 1 tablet (0.125 mg total) by mouth every 4 (four) hours as needed. 30 tablet 0  . levocetirizine (XYZAL) 5 MG tablet TAKE 1 TABLET(5 MG) BY MOUTH EVERY EVENING 30 tablet 5  . losartan (COZAAR) 50 MG tablet TAKE 1 TABLET(50 MG) BY MOUTH DAILY 90 tablet 2  . metFORMIN (GLUCOPHAGE) 500 MG tablet TAKE 2 TABLETS BY MOUTH TWICE DAILY 360 tablet 1  . montelukast (SINGULAIR) 10 MG tablet TAKE 1 TABLET(10 MG) BY MOUTH AT BEDTIME 90 tablet 1  . omeprazole (PRILOSEC) 20 MG capsule Take 1 capsule (20 mg total) by mouth 2 (two) times daily before a meal. 180 capsule 3  . ondansetron (ZOFRAN) 4 MG tablet Take 1 tablet (4 mg total) by mouth every 8 (eight) hours as needed for nausea or vomiting. 40 tablet 0  . Probiotic Product (PROBIOTIC PO) Take 1 tablet by mouth daily.    . promethazine (PHENERGAN) 25 MG tablet Take 1 tablet (25 mg total) by mouth every 6 (six) hours as needed for nausea or vomiting. 10 tablet 0  . propranolol (INDERAL) 60 MG tablet Take 1 tablet (60 mg total) by mouth 2 (two) times daily. 180 tablet 2  . rosuvastatin (CRESTOR) 5 MG tablet Take 1 tablet (5 mg total) by mouth 3 (three) times a week. 45 tablet 3  . sodium chloride (OCEAN) 0.65 % SOLN nasal spray Place 1 spray into both nostrils daily.    Marland Kitchen VITAMIN D PO Take 2,000 Units by mouth daily.    Marland Kitchen zolpidem (AMBIEN) 10 MG tablet Take 1 tablet (10 mg total) by mouth at bedtime as needed for sleep. 30 tablet 2  . HYDROcodone-acetaminophen  (NORCO/VICODIN) 5-325 MG tablet Take 1 tablet by mouth every 6 (six) hours as needed. Patient needs appointment for further refills. 100 tablet 0  . SUMAtriptan (IMITREX) 50 MG tablet Take 1 tablet (50 mg total) by mouth every 2 (two) hours as needed for migraine. May repeat in 2 hours if headache persists or recurs. 10 tablet 3  . azelastine (ASTELIN) 0.1 % nasal spray Place 2 sprays into both nostrils 2 (two) times daily. Use in each nostril as directed 30 mL 12  . BAYER MICROLET LANCETS lancets Use as directed once daily to check blood sugar.   DX E11.9 100 each 1  . Blood Glucose Monitoring Suppl (CONTOUR BLOOD GLUCOSE SYSTEM) DEVI Use daily to check blood sugar. DX E11.9 1 Device 0  . CONTOUR NEXT TEST test strip USE TO CHECK BLOOD SUGAR DAILY 100 each 2  . fluconazole (DIFLUCAN) 150 MG tablet Take 1 tablet (150 mg total) by mouth as needed. 1 tablet 0  . Fluoxetine HCl, PMDD, 20 MG TABS Take by mouth.     No facility-administered medications prior to visit.    No Known Allergies  Review of Systems  Constitutional: Positive for malaise/fatigue. Negative for fever.  HENT: Negative for congestion.   Eyes: Negative for blurred vision.  Respiratory: Negative for shortness of breath.   Cardiovascular: Negative for chest pain, palpitations and leg swelling.  Gastrointestinal: Negative for abdominal pain, blood in stool and nausea.  Genitourinary: Negative for dysuria and frequency.  Musculoskeletal: Positive for back pain and joint pain. Negative for falls.  Skin: Negative for rash.  Neurological: Negative for dizziness, loss of consciousness and headaches.  Endo/Heme/Allergies: Negative for environmental allergies.  Psychiatric/Behavioral: Positive for  depression. The patient is nervous/anxious.        Objective:    Physical Exam  BP 129/84   Pulse (!) 101   Wt 197 lb (89.4 kg)   LMP 01/08/2013   BMI 34.90 kg/m  Wt Readings from Last 3 Encounters:  12/28/19 197 lb (89.4 kg)    12/26/19 195 lb (88.5 kg)  12/23/19 195 lb 9.6 oz (88.7 kg)    Diabetic Foot Exam - Simple   No data filed     Lab Results  Component Value Date   WBC 6.2 11/12/2019   HGB 13.0 11/12/2019   HCT 41.1 11/12/2019   PLT 249 11/12/2019   GLUCOSE 116 (H) 11/12/2019   CHOL 174 09/19/2019   TRIG 126 09/19/2019   HDL 65 09/19/2019   LDLCALC 87 09/19/2019   ALT 28 11/12/2019   AST 33 11/12/2019   NA 141 11/12/2019   K 4.5 11/12/2019   CL 105 11/12/2019   CREATININE 0.92 11/12/2019   BUN 13 11/12/2019   CO2 24 11/12/2019   TSH 1.590 09/19/2019   HGBA1C 6.9 (H) 10/14/2018   MICROALBUR <0.7 09/17/2015    Lab Results  Component Value Date   TSH 1.590 09/19/2019   Lab Results  Component Value Date   WBC 6.2 11/12/2019   HGB 13.0 11/12/2019   HCT 41.1 11/12/2019   MCV 88.6 11/12/2019   PLT 249 11/12/2019   Lab Results  Component Value Date   NA 141 11/12/2019   K 4.5 11/12/2019   CO2 24 11/12/2019   GLUCOSE 116 (H) 11/12/2019   BUN 13 11/12/2019   CREATININE 0.92 11/12/2019   BILITOT 1.1 11/12/2019   ALKPHOS 157 (H) 11/12/2019   AST 33 11/12/2019   ALT 28 11/12/2019   PROT 7.9 11/12/2019   ALBUMIN 3.6 11/12/2019   CALCIUM 9.9 11/12/2019   ANIONGAP 12 11/12/2019   GFR 121.09 01/14/2018   Lab Results  Component Value Date   CHOL 174 09/19/2019   Lab Results  Component Value Date   HDL 65 09/19/2019   Lab Results  Component Value Date   LDLCALC 87 09/19/2019   Lab Results  Component Value Date   TRIG 126 09/19/2019   Lab Results  Component Value Date   CHOLHDL 2.7 09/19/2019   Lab Results  Component Value Date   HGBA1C 6.9 (H) 10/14/2018       Assessment & Plan:   Problem List Items Addressed This Visit    Insomnia    She has only taken a couple of doses of Ambien in the past month. She is encouraged to avoid taking it at this time as she treats her pain      GAD (generalized anxiety disorder)    She is taking Buspar just once a day and  has decreased her Alprazolam dosing. She uses only 1 tab many days. She is asked to minimize use and reminded she cannot use Hydrocodone together or the risk of complications increases dramatically. She acknowledges understanding of the seriousness.       Diabetes mellitus type 2 in obese (HCC)    hgba1c acceptable, minimize simple carbs. Increase exercise as tolerated. Continue current meds      Hypertension    Monitor and report any concerns. no changes to meds. Encouraged heart healthy diet such as the DASH diet and exercise as tolerated.       Back pain    She is now seeing sports medicine and awaits an MRI  to further investigate the worsening pain she is experiencing. She will continue Baclofen, Gabapentin, Acetaminophen and we will allow Hdrocodone APAP 7.5/325 1 tab po qid prn. Encouraged to try and minimize dosing use tid as able. Reassess next week. Encouraged to continue topical treatments and alternate ice and heat. Counseled for 40 minutes regarding activity, treatment options and the risks and benefits of treating pain with medications.       Relevant Medications   methylPREDNISolone (MEDROL) 4 MG tablet   HYDROcodone-acetaminophen (NORCO) 7.5-325 MG tablet      I have discontinued Tianni Z. Betton's Bayer Microlet Lancets, Contour Next Test, CONTOUR BLOOD GLUCOSE SYSTEM, azelastine, fluconazole, and HYDROcodone-acetaminophen. I am also having her start on methylPREDNISolone and HYDROcodone-acetaminophen. Additionally, I am having her maintain her sodium chloride, rosuvastatin, fluticasone, hyoscyamine, montelukast, omeprazole, losartan, Premarin, famotidine, FLUoxetine, metFORMIN, levocetirizine, BLACK COHOSH PO, VITAMIN D PO, (Ginger, Zingiber officinalis, (GINGER PO)), Probiotic Product (PROBIOTIC PO), propranolol, ACETAMINOPHEN-BUTALBITAL, promethazine, zolpidem, ALPRAZolam, busPIRone, amphetamine-dextroamphetamine, ondansetron, gabapentin, celecoxib, baclofen, and  SUMAtriptan.  Meds ordered this encounter  Medications  . methylPREDNISolone (MEDROL) 4 MG tablet    Sig: 5 tab po qd X 3d then 4 tab po qd X 3d then 3 tab po qd X 3d then 2 tab po qd x 3 d then 1 tab po qd x 3 qd    Dispense:  45 tablet    Refill:  0  . HYDROcodone-acetaminophen (NORCO) 7.5-325 MG tablet    Sig: Take 1 tablet by mouth every 6 (six) hours as needed for moderate pain.    Dispense:  100 tablet    Refill:  0  . SUMAtriptan (IMITREX) 50 MG tablet    Sig: Take 1 tablet (50 mg total) by mouth every 2 (two) hours as needed for migraine. May repeat in 2 hours if headache persists or recurs.    Dispense:  10 tablet    Refill:  3     I discussed the assessment and treatment plan with the patient. The patient was provided an opportunity to ask questions and all were answered. The patient agreed with the plan and demonstrated an understanding of the instructions.   The patient was advised to call back or seek an in-person evaluation if the symptoms worsen or if the condition fails to improve as anticipated.  I provided 40 minutes of non-face-to-face time during this encounter.   Danise Edge, MD

## 2019-12-29 ENCOUNTER — Telehealth: Payer: Self-pay | Admitting: *Deleted

## 2019-12-29 NOTE — Telephone Encounter (Signed)
Have her call sports med and see what they think she should switch too. Also make sure she is hydrating as dehydration can cause her symptoms

## 2019-12-29 NOTE — Telephone Encounter (Signed)
Called patient about appointment and she had stated that feels dizzy and thinks that it may be coming from the baclofen.  Would you like for her to call sports med?  They are the ones who had given it to her.  They just started her on it.

## 2019-12-30 NOTE — Telephone Encounter (Signed)
Sent patient mychart message

## 2020-01-04 ENCOUNTER — Other Ambulatory Visit: Payer: Self-pay

## 2020-01-04 ENCOUNTER — Telehealth (INDEPENDENT_AMBULATORY_CARE_PROVIDER_SITE_OTHER): Payer: 59 | Admitting: Family Medicine

## 2020-01-04 DIAGNOSIS — M5441 Lumbago with sciatica, right side: Secondary | ICD-10-CM

## 2020-01-04 NOTE — Assessment & Plan Note (Signed)
She is feeling better than she was last week. She has adjusted to the muscle relaxer and she feels the steroids have been helpful. She is less tearful and she feels her current level of pain is tolerable while she waits on her MRI.  No changes to therapy at this time.

## 2020-01-04 NOTE — Progress Notes (Signed)
Virtual Visit via phone Note  I connected with [name] on 01/04/20 at  9:00 AM EDT by a phone enabled telemedicine application and verified that I am speaking with the correct person using two identifiers.  Location: Patient: home, patient in visit Provider: home, provider in visit   I discussed the limitations of evaluation and management by telemedicine and the availability of in person appointments. The patient expressed understanding and agreed to proceed. Kem Boroughs, CMA was able to get the patient set up on a visit but visit had to be completed via telephone.after being unable to initiate video   Subjective:    Patient ID: Nicole Ball, female    DOB: 10/22/1975, 44 y.o.   MRN: [id number]  No chief complaint on file.   HPI Patient is in today for follow up on back pain and depression. She is doing much better this week. Her muscle relaxer and steroids have made the pain manageable and she feels she will be able to make it to her MRI appt in June in her current state. No new concerns and her mood is some improved as her pain improves. No c/o CP/palp/SOB/HA. Taking meds as prescribed  Past Medical History:  Diagnosis Date  . Abnormal serum level of alkaline phosphatase 05/21/2014  . Acute low back pain 12/19/2010  . ALLERGIC RHINITIS 10/16/2010  . Anxiety and depression 06/07/2011  . Atrophic vaginitis 08/17/2016  . Cervical cancer screening 01/10/2014   Menarche at 8 Regular and moderate flow, became irregular until OCPs  history of abnormal pap in past, repeat was normal Last pap roughly 3 years ago G0P0, s/p No history of abnormal MGM, no h/o MGM  No concerns today no gyn surgeries LMP May 2013  . CTS (carpal tunnel syndrome) 01/15/2014  . Diabetes mellitus type 2 in obese ([place]) 10/30/2011  . Dry eyes 08/17/2016  . Early menopause   . Essential hypertension   . HIATAL HERNIA WITH REFLUX, HX OF 10/16/2010  . Hyperlipidemia 04/21/2011  . Inappropriate sinus  tachycardia   . INSOMNIA 10/16/2010  . Knee pain, left 03/11/2012  . Migraine 03/07/2013  . Morbid obesity ([place])   . Palpitations 01/28/2015  . Reflux 04/22/2012  . Sleep apnea 06/07/2011  . Tobacco use disorder 02/10/2017  . Urinary urgency 08/17/2016  . UTI'S, HX OF 10/16/2010    Past Surgical History:  Procedure Laterality Date  . CHOLECYSTECTOMY      Family History  Problem Relation Age of Onset  . Lupus Mother   . Arthritis Maternal Grandmother   . Scleroderma Maternal Grandmother   . Glaucoma Maternal Grandmother   . Cataracts Maternal Grandmother   . Cancer Maternal Grandfather        prostate  . Coronary artery disease Maternal Grandfather        s/p angioplasty  . Diabetes Maternal Grandfather        Type 2  . Cancer Paternal Grandmother        Breast ca- dx in 43's  . Thyroid disease Paternal Grandmother   . Breast cancer Paternal Grandmother   . Diabetes Paternal Grandfather   . Thyroid disease Sister   . Asthma Sister   . Allergies Brother   . Cancer Father        cigarette, alcohol HEENT squamous cell  . Alcohol abuse Father   . Arthritis Other   . Alcohol abuse Maternal Aunt   . Alcohol abuse Maternal Uncle     Social History  Socioeconomic History  . Marital status: Married    Spouse name: Not on file  . Number of children: Not on file  . Years of education: Not on file  . Highest education level: Not on file  Occupational History  . Not on file  Tobacco Use  . Smoking status: Current Some Day Smoker    Packs/day: 0.10    Types: Cigars  . Smokeless tobacco: Never Used  . Tobacco comment: 1 black and milds  Substance and Sexual Activity  . Alcohol use: Yes    Comment: twice/year  . Drug use: No  . Sexual activity: Not on file  Other Topics Concern  . Not on file  Social History Narrative   Married lives w/ husband, works at [employer]   Social Determinants of Corporate investment banker Strain:   . Difficulty of Paying Living Expenses:    Food Insecurity:   . Worried About Programme researcher, broadcasting/film/video in the Last Year:   . Barista in the Last Year:   Transportation Needs:   . Freight forwarder (Medical):   Marland Kitchen Lack of Transportation (Non-Medical):   Physical Activity:   . Days of Exercise per Week:   . Minutes of Exercise per Session:   Stress:   . Feeling of Stress :   Social Connections:   . Frequency of Communication with Friends and Family:   . Frequency of Social Gatherings with Friends and Family:   . Attends Religious Services:   . Active Member of Clubs or Organizations:   . Attends Banker Meetings:   Marland Kitchen Marital Status:   Intimate Partner Violence:   . Fear of Current or Ex-Partner:   . Emotionally Abused:   Marland Kitchen Physically Abused:   . Sexually Abused:     Outpatient Medications Prior to Visit  Medication Sig Dispense Refill  . ACETAMINOPHEN-BUTALBITAL 50-325 MG TABS Take 1 tablet by mouth 2 (two) times daily as needed (HEADACHES). 60 tablet 0  . ALPRAZolam (XANAX) 1 MG tablet Take 1 tablet (1 mg total) by mouth 2 (two) times daily as needed. 60 tablet 1  . amphetamine-dextroamphetamine (ADDERALL XR) 30 MG 24 hr capsule Take 1 capsule (30 mg total) by mouth daily. 30 capsule 0  . baclofen (LIORESAL) 10 MG tablet Take 1 tablet (10 mg total) by mouth 2 (two) times daily as needed for muscle spasms. 30 each 2  . BLACK COHOSH PO Take 1 tablet by mouth daily.    . busPIRone (BUSPAR) 15 MG tablet Take 1 tablet (15 mg total) by mouth 2 (two) times daily. 60 tablet 1  . celecoxib (CELEBREX) 200 MG capsule One to 2 tablets by mouth daily as needed for pain. 60 capsule 2  . conjugated estrogens (PREMARIN) vaginal cream Apply small amount to affected area twice a week 42.5 g 1  . famotidine (PEPCID) 40 MG tablet Take 1 tablet (40 mg total) by mouth at bedtime. 30 tablet 5  . FLUoxetine (PROZAC) 20 MG capsule Take 1 capsule (20 mg total) by mouth 3 (three) times daily. 90 capsule 5  . fluticasone  (FLONASE) 50 MCG/ACT nasal spray Place 2 sprays into both nostrils daily. 16 g 1  . gabapentin (NEURONTIN) 600 MG tablet Take 1 tablet (600 mg total) by mouth 3 (three) times daily. 90 tablet 0  . Ginger, Zingiber officinalis, (GINGER PO) Take 1 tablet by mouth daily.    Marland Kitchen HYDROcodone-acetaminophen (NORCO) 7.5-325 MG tablet Take 1 tablet  by mouth every 6 (six) hours as needed for moderate pain. 100 tablet 0  . hyoscyamine (LEVSIN SL) 0.125 MG SL tablet Take 1 tablet (0.125 mg total) by mouth every 4 (four) hours as needed. 30 tablet 0  . levocetirizine (XYZAL) 5 MG tablet TAKE 1 TABLET(5 MG) BY MOUTH EVERY EVENING 30 tablet 5  . losartan (COZAAR) 50 MG tablet TAKE 1 TABLET(50 MG) BY MOUTH DAILY 90 tablet 2  . metFORMIN (GLUCOPHAGE) 500 MG tablet TAKE 2 TABLETS BY MOUTH TWICE DAILY 360 tablet 1  . methylPREDNISolone (MEDROL) 4 MG tablet 5 tab po qd X 3d then 4 tab po qd X 3d then 3 tab po qd X 3d then 2 tab po qd x 3 d then 1 tab po qd x 3 qd 45 tablet 0  . montelukast (SINGULAIR) 10 MG tablet TAKE 1 TABLET(10 MG) BY MOUTH AT BEDTIME 90 tablet 1  . omeprazole (PRILOSEC) 20 MG capsule Take 1 capsule (20 mg total) by mouth 2 (two) times daily before a meal. 180 capsule 3  . ondansetron (ZOFRAN) 4 MG tablet Take 1 tablet (4 mg total) by mouth every 8 (eight) hours as needed for nausea or vomiting. 40 tablet 0  . Probiotic Product (PROBIOTIC PO) Take 1 tablet by mouth daily.    . promethazine (PHENERGAN) 25 MG tablet Take 1 tablet (25 mg total) by mouth every 6 (six) hours as needed for nausea or vomiting. 10 tablet 0  . propranolol (INDERAL) 60 MG tablet Take 1 tablet (60 mg total) by mouth 2 (two) times daily. 180 tablet 2  . rosuvastatin (CRESTOR) 5 MG tablet Take 1 tablet (5 mg total) by mouth 3 (three) times a week. 45 tablet 3  . sodium chloride (OCEAN) 0.65 % SOLN nasal spray Place 1 spray into both nostrils daily.    . SUMAtriptan (IMITREX) 50 MG tablet Take 1 tablet (50 mg total) by mouth every  2 (two) hours as needed for migraine. May repeat in 2 hours if headache persists or recurs. 10 tablet 3  . VITAMIN D PO Take 2,000 Units by mouth daily.    Marland Kitchen zolpidem (AMBIEN) 10 MG tablet Take 1 tablet (10 mg total) by mouth at bedtime as needed for sleep. 30 tablet 2   No facility-administered medications prior to visit.    No Known Allergies  Review of Systems  Constitutional: Negative for fever.  Gastrointestinal: Negative for nausea.  Genitourinary: Negative for dysuria.  Musculoskeletal: Positive for back pain.  Neurological: Negative for headaches.  Psychiatric/Behavioral: Positive for depression. Negative for suicidal ideas. The patient is nervous/anxious.        Objective:    Physical Exam unable to obtain  LMP 01/08/2013  Wt Readings from Last 3 Encounters:  12/28/19 197 lb (89.4 kg)  12/26/19 195 lb (88.5 kg)  12/23/19 195 lb 9.6 oz (88.7 kg)    Diabetic Foot Exam - Simple   No data filed     Lab Results  Component Value Date   WBC 6.2 11/12/2019   HGB 13.0 11/12/2019   HCT 41.1 11/12/2019   PLT 249 11/12/2019   GLUCOSE 116 (H) 11/12/2019   CHOL 174 09/19/2019   TRIG 126 09/19/2019   HDL 65 09/19/2019   LDLCALC 87 09/19/2019   ALT 28 11/12/2019   AST 33 11/12/2019   NA 141 11/12/2019   K 4.5 11/12/2019   CL 105 11/12/2019   CREATININE 0.92 11/12/2019   BUN 13 11/12/2019   CO2 24  11/12/2019   TSH 1.590 09/19/2019   HGBA1C 6.9 (H) 10/14/2018   MICROALBUR <0.7 09/17/2015    Lab Results  Component Value Date   TSH 1.590 09/19/2019   Lab Results  Component Value Date   WBC 6.2 11/12/2019   HGB 13.0 11/12/2019   HCT 41.1 11/12/2019   MCV 88.6 11/12/2019   PLT 249 11/12/2019   Lab Results  Component Value Date   NA 141 11/12/2019   K 4.5 11/12/2019   CO2 24 11/12/2019   GLUCOSE 116 (H) 11/12/2019   BUN 13 11/12/2019   CREATININE 0.92 11/12/2019   BILITOT 1.1 11/12/2019   ALKPHOS 157 (H) 11/12/2019   AST 33 11/12/2019   ALT 28  11/12/2019   PROT 7.9 11/12/2019   ALBUMIN 3.6 11/12/2019   CALCIUM 9.9 11/12/2019   ANIONGAP 12 11/12/2019   GFR 121.09 01/14/2018   Lab Results  Component Value Date   CHOL 174 09/19/2019   Lab Results  Component Value Date   HDL 65 09/19/2019   Lab Results  Component Value Date   LDLCALC 87 09/19/2019   Lab Results  Component Value Date   TRIG 126 09/19/2019   Lab Results  Component Value Date   CHOLHDL 2.7 09/19/2019   Lab Results  Component Value Date   HGBA1C 6.9 (H) 10/14/2018       Assessment & Plan:   Problem List Items Addressed This Visit    Back pain    She is feeling better than she was last week. She has adjusted to the muscle relaxer and she feels the steroids have been helpful. She is less tearful and she feels her current level of pain is tolerable while she waits on her MRI.  No changes to therapy at this time.         I am having Nicole Ball maintain her sodium chloride, rosuvastatin, fluticasone, hyoscyamine, montelukast, omeprazole, losartan, Premarin, famotidine, FLUoxetine, metFORMIN, levocetirizine, BLACK COHOSH PO, VITAMIN D PO, (Ginger, Zingiber officinalis, (GINGER PO)), Probiotic Product (PROBIOTIC PO), propranolol, ACETAMINOPHEN-BUTALBITAL, promethazine, zolpidem, ALPRAZolam, busPIRone, amphetamine-dextroamphetamine, ondansetron, gabapentin, celecoxib, baclofen, methylPREDNISolone, HYDROcodone-acetaminophen, and SUMAtriptan.  No orders of the defined types were placed in this encounter.    I discussed the assessment and treatment plan with the patient. The patient was provided an opportunity to ask questions and all were answered. The patient agreed with the plan and demonstrated an understanding of the instructions.   The patient was advised to call back or seek an in-person evaluation if the symptoms worsen or if the condition fails to improve as anticipated.  I provided 10 minutes of non-face-to-face time during this  encounter.   Danise Edge, MD

## 2020-01-12 ENCOUNTER — Other Ambulatory Visit: Payer: Self-pay

## 2020-01-12 ENCOUNTER — Ambulatory Visit (HOSPITAL_COMMUNITY): Payer: 59 | Attending: Cardiovascular Disease

## 2020-01-12 DIAGNOSIS — U071 COVID-19: Secondary | ICD-10-CM

## 2020-01-12 DIAGNOSIS — E782 Mixed hyperlipidemia: Secondary | ICD-10-CM

## 2020-01-12 DIAGNOSIS — R000 Tachycardia, unspecified: Secondary | ICD-10-CM | POA: Insufficient documentation

## 2020-01-15 ENCOUNTER — Other Ambulatory Visit: Payer: Self-pay | Admitting: Cardiology

## 2020-01-15 DIAGNOSIS — I10 Essential (primary) hypertension: Secondary | ICD-10-CM

## 2020-01-15 DIAGNOSIS — E782 Mixed hyperlipidemia: Secondary | ICD-10-CM

## 2020-01-15 DIAGNOSIS — R000 Tachycardia, unspecified: Secondary | ICD-10-CM

## 2020-01-21 ENCOUNTER — Other Ambulatory Visit: Payer: 59

## 2020-01-23 ENCOUNTER — Other Ambulatory Visit: Payer: Self-pay | Admitting: Family Medicine

## 2020-01-30 ENCOUNTER — Other Ambulatory Visit: Payer: Self-pay | Admitting: Family Medicine

## 2020-01-31 MED ORDER — HYDROCODONE-ACETAMINOPHEN 7.5-325 MG PO TABS: 1.0000 | ORAL_TABLET | Freq: Four times a day (QID) | ORAL | 0 refills | Status: DC | PRN

## 2020-01-31 NOTE — Telephone Encounter (Signed)
Hydrocodone refill. [facility] Pt.   Last OV: 01/04/2020 Last Fill: 12/28/2019 #100 and 0RF Pt sig: 1 tab q6h prn UDS: 05/04/2018 Low risk

## 2020-02-08 ENCOUNTER — Other Ambulatory Visit: Payer: Self-pay

## 2020-02-08 ENCOUNTER — Telehealth (INDEPENDENT_AMBULATORY_CARE_PROVIDER_SITE_OTHER): Payer: 59 | Admitting: Cardiology

## 2020-02-08 ENCOUNTER — Telehealth: Payer: Self-pay | Admitting: *Deleted

## 2020-02-08 VITALS — Ht 63.0 in | Wt 197.0 lb

## 2020-02-08 DIAGNOSIS — G4733 Obstructive sleep apnea (adult) (pediatric): Secondary | ICD-10-CM

## 2020-02-08 DIAGNOSIS — I10 Essential (primary) hypertension: Secondary | ICD-10-CM | POA: Diagnosis not present

## 2020-02-08 DIAGNOSIS — E669 Obesity, unspecified: Secondary | ICD-10-CM

## 2020-02-08 NOTE — Addendum Note (Signed)
Addended by: Reesa Chew on: 02/08/2020 01:30 PM   Modules accepted: Orders

## 2020-02-08 NOTE — Telephone Encounter (Signed)
NEW SETTINGS  Turner, Cornelious Bryant, MD  Reesa Chew, CMA Please order ResMed CPAP on auto from 4 to 20cm H2O with heated humidity, mask of choice and supplies. FOllowup 8 weeks after getting device   Traci

## 2020-02-08 NOTE — Addendum Note (Signed)
Addended by: Gunnar Fusi A on: 02/08/2020 11:41 AM   Modules accepted: Orders

## 2020-02-08 NOTE — Progress Notes (Addendum)
Virtual Visit via Telephone Note   This visit type was conducted due to national recommendations for restrictions regarding the COVID-19 Pandemic (e.g. social distancing) in an effort to limit this patient's exposure and mitigate transmission in our community.  Due to her co-morbid illnesses, this patient is at least at moderate risk for complications without adequate follow up.  This format is felt to be most appropriate for this patient at this time.  The patient did not have access to video technology/had technical difficulties with video requiring transitioning to audio format only (telephone).  All issues noted in this document were discussed and addressed.  No physical exam could be performed with this format.  Please refer to the patient's chart for her  consent to [facility]health [facility].   Evaluation Performed:  Follow-up visit  This visit type was conducted due to national recommendations for restrictions regarding the COVID-19 Pandemic (e.g. social distancing).  This format is felt to be most appropriate for this patient at this time.  All issues noted in this document were discussed and addressed.  No physical exam was performed (except for noted visual exam findings with Video Visits).  Please refer to the patient's chart (MyChart message for video visits and phone note for telephone visits) for the patient's consent to telehealth [facility].  Date:  02/08/2020   ID:  Nicole Linesishia Z Mcvicar, DOB [birth year], MRN [id number]  Patient Location:  Home  Provider location:   [place]  PCP:  Bradd CanaryBlyth, Stacey A, MD  Cardiologist:  Tobias AlexanderKatarina Nelson, MD  Sleep Medicine:  Armanda Magicraci Cydni Reddoch, MD Electrophysiologist:  None   Chief Complaint:  OSA  History of Present Illness:    Nicole Ball is a 44 y.o. female who presents via audio/video conferencing for a telehealth visit today.    Nicole Ball is a 44 y.o. female with a hx of OSA on CPAP.  She was referred to me for further  evaluation by Lucile Craterana Dunn, PA.  She is she was diagnosed with sleep apnea about 6 years ago and has been on CPAP therapy since then.  She is doing well with her CPAP device and thinks that she has gotten used to it.  She tolerates the mask and feels the pressure is adequate.  Since going on CPAP she feels rested in the am and has no significant daytime sleepiness when she goes to bed on time.  She has some problems with dry mouth but does not use a chin strap with her nasal mask.  She does sleep some with her mouth open.  She has not tried a full face mask.  She does not think that he snores and does not wake up gasping for breath or snoring.  The patient does not have symptoms concerning for COVID-19 infection (fever, chills, cough, or new shortness of breath).    Prior CV studies:   The following studies were reviewed today:  PAP compliance download  Past Medical History:  Diagnosis Date  . Abnormal serum level of alkaline phosphatase 05/21/2014  . Acute low back pain 12/19/2010  . ALLERGIC RHINITIS 10/16/2010  . Anxiety and depression 06/07/2011  . Atrophic vaginitis 08/17/2016  . Cervical cancer screening 01/10/2014   Menarche at 8 Regular and moderate flow, became irregular until OCPs  history of abnormal pap in past, repeat was normal Last pap roughly 3 years ago G0P0, s/p No history of abnormal MGM, no h/o MGM  No concerns today no gyn surgeries LMP May 2013  .  CTS (carpal tunnel syndrome) 01/15/2014  . Diabetes mellitus type 2 in obese ([facility]) 10/30/2011  . Dry eyes 08/17/2016  . Early menopause   . Essential hypertension   . HIATAL HERNIA WITH REFLUX, HX OF 10/16/2010  . Hyperlipidemia 04/21/2011  . Inappropriate sinus tachycardia   . INSOMNIA 10/16/2010  . Knee pain, left 03/11/2012  . Migraine 03/07/2013  . Morbid obesity ([facility])   . Palpitations 01/28/2015  . Reflux 04/22/2012  . Sleep apnea 06/07/2011  . Tobacco use disorder 02/10/2017  . Urinary urgency 08/17/2016  . UTI'S, HX OF 10/16/2010   Past  Surgical History:  Procedure Laterality Date  . CHOLECYSTECTOMY       Current Meds  Medication Sig  . ACETAMINOPHEN-BUTALBITAL 50-325 MG TABS Take 1 tablet by mouth 2 (two) times daily as needed (HEADACHES).  . baclofen (LIORESAL) 10 MG tablet Take 1 tablet (10 mg total) by mouth 2 (two) times daily as needed for muscle spasms.  Marland Kitchen BLACK COHOSH PO Take 1 tablet by mouth daily.  . celecoxib (CELEBREX) 200 MG capsule One to 2 tablets by mouth daily as needed for pain.  Marland Kitchen conjugated estrogens (PREMARIN) vaginal cream Apply small amount to affected area twice a week  . FLUoxetine (PROZAC) 20 MG capsule Take 1 capsule (20 mg total) by mouth 3 (three) times daily.  . fluticasone (FLONASE) 50 MCG/ACT nasal spray Place 2 sprays into both nostrils daily.  Marland Kitchen gabapentin (NEURONTIN) 600 MG tablet Take 1 tablet (600 mg total) by mouth 3 (three) times daily.  . Ginger, Zingiber officinalis, (GINGER PO) Take 1 tablet by mouth daily.  Marland Kitchen HYDROcodone-acetaminophen (NORCO) 7.5-325 MG tablet Take 1 tablet by mouth every 6 (six) hours as needed for moderate pain.  . hyoscyamine (LEVSIN SL) 0.125 MG SL tablet Take 1 tablet (0.125 mg total) by mouth every 4 (four) hours as needed.  Marland Kitchen levocetirizine (XYZAL) 5 MG tablet TAKE 1 TABLET(5 MG) BY MOUTH EVERY EVENING  . losartan (COZAAR) 50 MG tablet TAKE 1 TABLET(50 MG) BY MOUTH DAILY  . metFORMIN (GLUCOPHAGE) 500 MG tablet TAKE 2 TABLETS BY MOUTH TWICE DAILY  . montelukast (SINGULAIR) 10 MG tablet TAKE 1 TABLET(10 MG) BY MOUTH AT BEDTIME  . omeprazole (PRILOSEC) 20 MG capsule Take 1 capsule (20 mg total) by mouth 2 (two) times daily before a meal.  . ondansetron (ZOFRAN) 4 MG tablet TAKE 1 TABLET(4 MG) BY MOUTH EVERY 8 HOURS AS NEEDED FOR NAUSEA OR VOMITING  . Probiotic Product (PROBIOTIC PO) Take 1 tablet by mouth daily.  . promethazine (PHENERGAN) 25 MG tablet Take 1 tablet (25 mg total) by mouth every 6 (six) hours as needed for nausea or vomiting.  . propranolol  (INDERAL) 60 MG tablet Take 1 tablet (60 mg total) by mouth 2 (two) times daily.  . rosuvastatin (CRESTOR) 5 MG tablet TAKE 1 TABLET BY MOUTH THREE TIMES A WEEK  . sodium chloride (OCEAN) 0.65 % SOLN nasal spray Place 1 spray into both nostrils daily.  . SUMAtriptan (IMITREX) 50 MG tablet Take 1 tablet (50 mg total) by mouth every 2 (two) hours as needed for migraine. May repeat in 2 hours if headache persists or recurs.  Marland Kitchen zolpidem (AMBIEN) 10 MG tablet Take 1 tablet (10 mg total) by mouth at bedtime as needed for sleep.  . [DISCONTINUED] famotidine (PEPCID) 40 MG tablet Take 1 tablet (40 mg total) by mouth at bedtime.  . [DISCONTINUED] VITAMIN D PO Take 2,000 Units by mouth daily.  Allergies:   Patient has no known allergies.   Social History   Tobacco Use  . Smoking status: Current Some Day Smoker    Packs/day: 0.10    Types: Cigars  . Smokeless tobacco: Never Used  . Tobacco comment: 1 black and milds  Vaping Use  . Vaping Use: Never used  Substance Use Topics  . Alcohol use: Yes    Comment: twice/year  . Drug use: No     Family Hx: The patient's family history includes Alcohol abuse in her father, maternal aunt, and maternal uncle; Allergies in her brother; Arthritis in her maternal grandmother and another family member; Asthma in her sister; Breast cancer in her paternal grandmother; Cancer in her father, maternal grandfather, and paternal grandmother; Cataracts in her maternal grandmother; Coronary artery disease in her maternal grandfather; Diabetes in her maternal grandfather and paternal grandfather; Glaucoma in her maternal grandmother; Lupus in her mother; Scleroderma in her maternal grandmother; Thyroid disease in her paternal grandmother and sister.  ROS:   Please see the history of present illness.     All other systems reviewed and are negative.   Labs/Other Tests and Data Reviewed:    Recent Labs: 09/19/2019: TSH 1.590 11/12/2019: ALT 28; BUN 13; Creatinine,  Ser 0.92; Hemoglobin 13.0; Platelets 249; Potassium 4.5; Sodium 141   Recent Lipid Panel Lab Results  Component Value Date/Time   CHOL 174 09/19/2019 02:54 PM   TRIG 126 09/19/2019 02:54 PM   HDL 65 09/19/2019 02:54 PM   CHOLHDL 2.7 09/19/2019 02:54 PM   CHOLHDL 2 01/14/2018 12:21 PM   LDLCALC 87 09/19/2019 02:54 PM    Wt Readings from Last 3 Encounters:  02/08/20 197 lb (89.4 kg)  12/28/19 197 lb (89.4 kg)  12/26/19 195 lb (88.5 kg)     Objective:    Vital Signs:  Ht 5\' 3"  (1.6 m)   Wt 197 lb (89.4 kg)   LMP 01/08/2013   BMI 34.90 kg/m     ASSESSMENT & PLAN:    1.  OSA -  The patient is tolerating PAP therapy well without any problems.   The patient has been using and benefiting from PAP use and will continue to benefit from therapy.  -her device is over 46 years old and she would like a new device and mask/headgear -I do not have access to her download and her chip is not in the device anymore so I will contact her DME to see what pressure she is on to order a new PAP device and she will followup with me 8 weeks after getting her new device per insurance requirements.   2.  HTN -continue on Inderal LA 60mg  BID and Losartan 50mg  daily  3.  Obesity -I have encouraged her to get into a routine exercise program and cut back on carbs and portions.  -she would like [facility] be [facility] which we will place a referral for  COVID-19 Education: The signs and symptoms of COVID-19 were discussed with the patient and how to seek care for testing (follow up with PCP or arrange E-visit).  The importance of social distancing was discussed today.  Patient Risk:   After full review of this patient's clinical status, I feel that they are at least moderate risk at this time.  Time:   Today, I have spent 20 minutes on telemedicine discussing medical problems including OSA, HTN, obesity and reviewing patient's chart including PAP compliance  download.  Medication Adjustments/Labs  and Tests Ordered: Current medicines are reviewed at length with the patient today.  Concerns regarding medicines are outlined above.  Tests Ordered: No orders of the defined types were placed in this encounter.  Medication Changes: No orders of the defined types were placed in this encounter.   Disposition:  Follow up in 1 year(s)  Signed, Armanda Magic, MD  02/08/2020 9:25 AM    [facility]

## 2020-02-08 NOTE — Telephone Encounter (Signed)
Old machine resmed S9  escape , SN# [id number].   current settings she is on from her DME.  5/20 cm H20.    DME selection is [facility] Patient understands she will be contacted by [facility] to set up her cpap. Patient understands to call if [facility] does not contact her with new setup in a timely manner. Patient understands they will be called once confirmation has been received from [facility] that they have received their new machine to schedule 10 week follow up appointment.  [facility] notified of new cpap order  Please add to airview Patient was grateful for the call and thanked me.

## 2020-02-08 NOTE — Telephone Encounter (Signed)
-----   Message from Quintella Reichert, MD sent at 02/08/2020  9:30 AM EDT ----- Please order a new CPAP device.  I do not have her current settings as she has no SD card right now and so please get the current settings she is on from her DME.  Also order new PAP supplies and new mask of choice

## 2020-02-11 ENCOUNTER — Other Ambulatory Visit: Payer: 59

## 2020-02-20 ENCOUNTER — Other Ambulatory Visit: Payer: Self-pay | Admitting: Medical

## 2020-02-24 ENCOUNTER — Other Ambulatory Visit: Payer: Self-pay | Admitting: Family Medicine

## 2020-02-24 ENCOUNTER — Other Ambulatory Visit: Payer: Self-pay

## 2020-02-24 ENCOUNTER — Other Ambulatory Visit: Payer: Self-pay | Admitting: Medical

## 2020-02-24 ENCOUNTER — Telehealth: Payer: Self-pay | Admitting: *Deleted

## 2020-02-24 DIAGNOSIS — R000 Tachycardia, unspecified: Secondary | ICD-10-CM

## 2020-02-24 DIAGNOSIS — E782 Mixed hyperlipidemia: Secondary | ICD-10-CM

## 2020-02-24 DIAGNOSIS — I10 Essential (primary) hypertension: Secondary | ICD-10-CM

## 2020-02-24 MED ORDER — ONDANSETRON HCL 4 MG PO TABS: 4.0000 mg | ORAL_TABLET | Freq: Three times a day (TID) | ORAL | 0 refills | Status: DC | PRN

## 2020-02-24 MED ORDER — ROSUVASTATIN CALCIUM 5 MG PO TABS: 5.0000 mg | ORAL_TABLET | ORAL | 3 refills | Status: DC

## 2020-02-24 NOTE — Telephone Encounter (Signed)
Patient needs refills on cyclobenzaprine and hydrocodone  Cyclobenzaprine filled last 05/2019.  Hydrocodone Last written: 01/31/20 Last ov: 01/04/20 Next ov: none Contract: need at some point UDS: need at some point

## 2020-02-24 NOTE — Telephone Encounter (Signed)
Requesting:norco Contract:01/06/20 UDS: n/a Last Visit:01/03/18 Next Visit: n/A Last Refill:01/31/20  Please Advise

## 2020-02-25 ENCOUNTER — Other Ambulatory Visit: Payer: Self-pay | Admitting: Family Medicine

## 2020-02-25 DIAGNOSIS — M549 Dorsalgia, unspecified: Secondary | ICD-10-CM

## 2020-02-25 MED ORDER — CYCLOBENZAPRINE HCL 10 MG PO TABS: 10.0000 mg | ORAL_TABLET | Freq: Two times a day (BID) | ORAL | 2 refills | Status: AC | PRN

## 2020-02-25 MED ORDER — HYDROCODONE-ACETAMINOPHEN 7.5-325 MG PO TABS: 1.0000 | ORAL_TABLET | Freq: Four times a day (QID) | ORAL | 0 refills | Status: DC | PRN

## 2020-02-25 NOTE — Telephone Encounter (Signed)
Have refilled both medications.

## 2020-02-27 NOTE — Telephone Encounter (Signed)
Patient notified

## 2020-02-29 ENCOUNTER — Other Ambulatory Visit: Payer: 59

## 2020-03-01 ENCOUNTER — Other Ambulatory Visit: Payer: Self-pay

## 2020-03-01 ENCOUNTER — Ambulatory Visit
Admission: RE | Admit: 2020-03-01 | Discharge: 2020-03-01 | Disposition: A | Discharge status: Home / Self Care | Payer: 59 | Source: Ambulatory Visit | Attending: Family Medicine | Admitting: Family Medicine

## 2020-03-01 DIAGNOSIS — M5416 Radiculopathy, lumbar region: Secondary | ICD-10-CM

## 2020-03-10 ENCOUNTER — Other Ambulatory Visit: Payer: Self-pay | Admitting: Family Medicine

## 2020-03-13 ENCOUNTER — Telehealth (INDEPENDENT_AMBULATORY_CARE_PROVIDER_SITE_OTHER): Payer: 59 | Admitting: Family Medicine

## 2020-03-13 ENCOUNTER — Other Ambulatory Visit: Payer: Self-pay

## 2020-03-13 DIAGNOSIS — M5416 Radiculopathy, lumbar region: Secondary | ICD-10-CM | POA: Diagnosis not present

## 2020-03-13 NOTE — Assessment & Plan Note (Signed)
MRI was revealing for possible impingement to the source of her pain. -Counseled supportive care. -Pursue epidural.

## 2020-03-13 NOTE — Progress Notes (Signed)
Virtual Visit via Video Note  I connected with Nicole Ball on 03/13/20 at  1:30 PM EDT by a video enabled telemedicine application and verified that I am speaking with the correct person using two identifiers.   I discussed the limitations of evaluation and management by telemedicine and the availability of in person appointments. The patient expressed understanding and agreed to proceed.  Patient: work  Physician: office   History of Present Illness:  Nicole Ball is a 44 year old female is following up for her low back pain and lumbar radiculopathy. MRI was revealing for a possible impingement of the L4 level.  Observations/Objective:  Gen: NAD, alert, cooperative with exam, well-appearing  Assessment and Plan:  Lumbar radiculopathy: MRI was revealing for possible impingement to the source of her pain. -Counseled supportive care. -Pursue epidural.  Follow Up Instructions:    I discussed the assessment and treatment plan with the patient. The patient was provided an opportunity to ask questions and all were answered. The patient agreed with the plan and demonstrated an understanding of the instructions.   The patient was advised to call back or seek an in-person evaluation if the symptoms worsen or if the condition fails to improve as anticipated.   Clare Gandy, MD

## 2020-03-14 ENCOUNTER — Telehealth: Payer: Self-pay | Admitting: Cardiology

## 2020-03-14 NOTE — Telephone Encounter (Signed)
Pt called and stated that she needs dr. Turner to send something to state that the pt needs a new cpap machine 

## 2020-03-14 NOTE — Telephone Encounter (Signed)
Pt called and stated that she needs dr. Mayford Knife to send something to state that the pt needs a new cpap machine

## 2020-03-17 ENCOUNTER — Other Ambulatory Visit: Payer: Self-pay | Admitting: Medical

## 2020-03-18 ENCOUNTER — Other Ambulatory Visit (HOSPITAL_COMMUNITY): Payer: Self-pay | Admitting: Psychiatry

## 2020-03-20 ENCOUNTER — Telehealth (INDEPENDENT_AMBULATORY_CARE_PROVIDER_SITE_OTHER): Payer: 59 | Admitting: Psychiatry

## 2020-03-20 ENCOUNTER — Other Ambulatory Visit: Payer: Self-pay

## 2020-03-20 DIAGNOSIS — F410 Panic disorder [episodic paroxysmal anxiety] without agoraphobia: Secondary | ICD-10-CM

## 2020-03-20 DIAGNOSIS — F411 Generalized anxiety disorder: Secondary | ICD-10-CM

## 2020-03-20 DIAGNOSIS — F330 Major depressive disorder, recurrent, mild: Secondary | ICD-10-CM | POA: Diagnosis not present

## 2020-03-20 DIAGNOSIS — F909 Attention-deficit hyperactivity disorder, unspecified type: Secondary | ICD-10-CM | POA: Diagnosis not present

## 2020-03-20 MED ORDER — BUSPIRONE HCL 15 MG PO TABS: 15.0000 mg | ORAL_TABLET | Freq: Two times a day (BID) | ORAL | 2 refills | Status: DC

## 2020-03-20 MED ORDER — AMPHETAMINE-DEXTROAMPHET ER 20 MG PO CP24
40.0000 mg | ORAL_CAPSULE | ORAL | 0 refills | Status: DC
Start: 2020-03-20 — End: 2020-03-29

## 2020-03-20 MED ORDER — AMPHETAMINE-DEXTROAMPHET ER 20 MG PO CP24
40.0000 mg | ORAL_CAPSULE | ORAL | 0 refills | Status: DC
Start: 2020-05-19 — End: 2020-05-16

## 2020-03-20 MED ORDER — ALPRAZOLAM 1 MG PO TABS: 1.0000 mg | ORAL_TABLET | Freq: Two times a day (BID) | ORAL | 2 refills | Status: DC | PRN

## 2020-03-20 MED ORDER — AMPHETAMINE-DEXTROAMPHET ER 20 MG PO CP24
40.0000 mg | ORAL_CAPSULE | ORAL | 0 refills | Status: DC
Start: 2020-04-19 — End: 2020-05-16

## 2020-03-20 MED ORDER — ZOLPIDEM TARTRATE 10 MG PO TABS: 10.0000 mg | ORAL_TABLET | Freq: Every evening | ORAL | 2 refills | Status: DC | PRN

## 2020-03-20 NOTE — Progress Notes (Signed)
[facility] MD/PA/NP OP Progress Note  03/20/2020 3:57 PM Nicole Ball  MRN:  [id number] Interview was conducted by phone and I verified that I was speaking with the correct person using two identifiers. I discussed the limitations of evaluation and management by telemedicine and  the availability of in person appointments. Patient expressed understanding and agreed to proceed. Patient location - home; physician - home office.  Chief Complaint: Anxiety.  HPI: 44yo married AAFwith along standinghx ofdepression (on and off) and generalized anxiety. She used to have frequent panic attacks andis prescribed alprazolam1mg  bid prn these. They are much less frequent now but they still occur occasionally. She is on buspirone and fluoxetine combination for depression/anxiety. In thepast she tried citalopram and venlafaxine but could not tolerate excessive sweating and other adverse effects which now, in perspective, she thinks might have been more related to her menopausal symptoms. 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 She has never been formally diagnosed with ADD. We added Adderall XR30 mg and she noticedimprovementinfocusing ability, less fatigue and less anxiety as well.She would like to try a higher dose though as focusing ability  "could be better". Anxiety related to COVID situation however continues.  Visit Diagnosis:    ICD-10-CM   1. Adult ADHD  F90.9   2. GAD (generalized anxiety disorder)  F41.1   3. Panic disorder without agoraphobia  F41.0   4. Major depressive disorder, recurrent, mild (HCC)  F33.0     Past Psychiatric History: Please see intake H&P.  Past Medical History:  Past Medical History:  Diagnosis Date  . Abnormal serum level of alkaline phosphatase 05/21/2014  . Acute low back pain  12/19/2010  . ALLERGIC RHINITIS 10/16/2010  . Anxiety and depression 06/07/2011  . Atrophic vaginitis 08/17/2016  . Cervical cancer screening 01/10/2014   Menarche at 8 Regular and moderate flow, became irregular until OCPs  history of abnormal pap in past, repeat was normal Last pap roughly 3 years ago G0P0, s/p No history of abnormal MGM, no h/o MGM  No concerns today no gyn surgeries LMP May 2013  . CTS (carpal tunnel syndrome) 01/15/2014  . Diabetes mellitus type 2 in obese (HCC) 10/30/2011  . Dry eyes 08/17/2016  . Early menopause   . Essential hypertension   . HIATAL HERNIA WITH REFLUX, HX OF 10/16/2010  . Hyperlipidemia 04/21/2011  . Inappropriate sinus tachycardia   . INSOMNIA 10/16/2010  . Knee pain, left 03/11/2012  . Migraine 03/07/2013  . Morbid obesity (HCC)   . Palpitations 01/28/2015  . Reflux 04/22/2012  . Sleep apnea 06/07/2011  . Tobacco use disorder 02/10/2017  . Urinary urgency 08/17/2016  . UTI'S, HX OF 10/16/2010    Past Surgical History:  Procedure Laterality Date  . CHOLECYSTECTOMY      Family Psychiatric History: Reviewed.  Family History:  Family History  Problem Relation Age of Onset  . Lupus Mother   . Arthritis Maternal Grandmother   . Scleroderma Maternal Grandmother   . Glaucoma Maternal Grandmother   . Cataracts Maternal Grandmother   . Cancer Maternal Grandfather        prostate  . Coronary artery disease Maternal Grandfather        s/p angioplasty  . Diabetes Maternal Grandfather        Type 2  . Cancer Paternal Grandmother  Breast ca- dx in 50's  . Thyroid disease Paternal Grandmother   . Breast cancer Paternal Grandmother   . Diabetes Paternal Grandfather   . Thyroid disease Sister   . Asthma Sister   . Allergies Brother   . Cancer Father        cigarette, alcohol HEENT squamous cell  . Alcohol abuse Father   . Arthritis Other   . Alcohol abuse Maternal Aunt   . Alcohol abuse Maternal Uncle     Social History:  Social History    Socioeconomic History  . Marital status: Married    Spouse name: Not on file  . Number of children: Not on file  . Years of education: Not on file  . Highest education level: Not on file  Occupational History  . Not on file  Tobacco Use  . Smoking status: Current Some Day Smoker    Packs/day: 0.10    Types: Cigars  . Smokeless tobacco: Never Used  . Tobacco comment: 1 black and milds  Vaping Use  . Vaping Use: Never used  Substance and Sexual Activity  . Alcohol use: Yes    Comment: twice/year  . Drug use: No  . Sexual activity: Not on file  Other Topics Concern  . Not on file  Social History Narrative   Married lives w/ husband, works at [employer]   Social Determinants of Corporate investment bankerHealth   Financial Resource Strain:   . Difficulty of Paying Living Expenses:   Food Insecurity:   . Worried About Programme researcher, broadcasting/film/videounning Out of Food in the Last Year:   . Baristaan Out of Food in the Last Year:   Transportation Needs:   . Freight forwarderLack of Transportation (Medical):   Marland Kitchen. Lack of Transportation (Non-Medical):   Physical Activity:   . Days of Exercise per Week:   . Minutes of Exercise per Session:   Stress:   . Feeling of Stress :   Social Connections:   . Frequency of Communication with Friends and Family:   . Frequency of Social Gatherings with Friends and Family:   . Attends Religious Services:   . Active Member of Clubs or Organizations:   . Attends BankerClub or Organization Meetings:   Marland Kitchen. Marital Status:     Allergies: No Known Allergies  Metabolic Disorder Labs: Lab Results  Component Value Date   HGBA1C 6.9 (H) 10/14/2018   MPG 140 (H) 05/19/2014   MPG 148 (H) 10/14/2013   No results found for: PROLACTIN Lab Results  Component Value Date   CHOL 174 09/19/2019   TRIG 126 09/19/2019   HDL 65 09/19/2019   CHOLHDL 2.7 09/19/2019   VLDL 16.131.0 01/14/2018   LDLCALC 87 09/19/2019   LDLCALC 61 01/14/2018   Lab Results  Component Value Date   TSH 1.590 09/19/2019   TSH 1.29 01/14/2018     Therapeutic Level Labs: No results found for: LITHIUM No results found for: VALPROATE No components found for:  CBMZ  Current Medications: Current Outpatient Medications  Medication Sig Dispense Refill  . busPIRone (BUSPAR) 15 MG tablet Take 1 tablet (15 mg total) by mouth 2 (two) times daily. 60 tablet 2  . ACETAMINOPHEN-BUTALBITAL 50-325 MG TABS Take 1 tablet by mouth 2 (two) times daily as needed (HEADACHES). 60 tablet 0  . ALPRAZolam (XANAX) 1 MG tablet Take 1 tablet (1 mg total) by mouth 2 (two) times daily as needed for anxiety. 60 tablet 2  . amphetamine-dextroamphetamine (ADDERALL XR) 20 MG 24 hr capsule Take 2 capsules (  40 mg total) by mouth every morning. 60 capsule 0  . [START ON 04/19/2020] amphetamine-dextroamphetamine (ADDERALL XR) 20 MG 24 hr capsule Take 2 capsules (40 mg total) by mouth every morning. 60 capsule 0  . [START ON 05/19/2020] amphetamine-dextroamphetamine (ADDERALL XR) 20 MG 24 hr capsule Take 2 capsules (40 mg total) by mouth every morning. 60 capsule 0  . baclofen (LIORESAL) 10 MG tablet Take 1 tablet (10 mg total) by mouth 2 (two) times daily as needed for muscle spasms. 30 each 2  . BLACK COHOSH PO Take 1 tablet by mouth daily.    . celecoxib (CELEBREX) 200 MG capsule One to 2 tablets by mouth daily as needed for pain. 60 capsule 2  . conjugated estrogens (PREMARIN) vaginal cream Apply small amount to affected area twice a week 42.5 g 1  . cyclobenzaprine (FLEXERIL) 10 MG tablet Take 1 tablet (10 mg total) by mouth 2 (two) times daily as needed for muscle spasms. 60 tablet 2  . FLUoxetine (PROZAC) 20 MG capsule TAKE 1 CAPSULE(20 MG) BY MOUTH THREE TIMES DAILY 90 capsule 5  . fluticasone (FLONASE) 50 MCG/ACT nasal spray SHAKE LIQUID AND USE 2 SPRAYS IN EACH NOSTRIL DAILY 16 g 1  . gabapentin (NEURONTIN) 600 MG tablet TAKE 1 TABLET(600 MG) BY MOUTH THREE TIMES DAILY 90 tablet 0  . Ginger, Zingiber officinalis, (GINGER PO) Take 1 tablet by mouth daily.    Marland Kitchen  HYDROcodone-acetaminophen (NORCO) 7.5-325 MG tablet Take 1 tablet by mouth every 6 (six) hours as needed for moderate pain. 100 tablet 0  . hyoscyamine (LEVSIN SL) 0.125 MG SL tablet Take 1 tablet (0.125 mg total) by mouth every 4 (four) hours as needed. 30 tablet 0  . levocetirizine (XYZAL) 5 MG tablet TAKE 1 TABLET(5 MG) BY MOUTH EVERY EVENING 30 tablet 5  . losartan (COZAAR) 50 MG tablet TAKE 1 TABLET(50 MG) BY MOUTH DAILY 90 tablet 2  . metFORMIN (GLUCOPHAGE) 500 MG tablet TAKE 2 TABLETS BY MOUTH TWICE DAILY 360 tablet 1  . montelukast (SINGULAIR) 10 MG tablet TAKE 1 TABLET(10 MG) BY MOUTH AT BEDTIME 90 tablet 1  . omeprazole (PRILOSEC) 20 MG capsule Take 1 capsule (20 mg total) by mouth 2 (two) times daily before a meal. 180 capsule 3  . ondansetron (ZOFRAN) 4 MG tablet Take 1 tablet (4 mg total) by mouth every 8 (eight) hours as needed for nausea or vomiting. 40 tablet 0  . Probiotic Product (PROBIOTIC PO) Take 1 tablet by mouth daily.    . promethazine (PHENERGAN) 25 MG tablet Take 1 tablet (25 mg total) by mouth every 6 (six) hours as needed for nausea or vomiting. 10 tablet 0  . propranolol (INDERAL) 60 MG tablet Take 1 tablet (60 mg total) by mouth 2 (two) times daily. 180 tablet 2  . rosuvastatin (CRESTOR) 5 MG tablet Take 1 tablet (5 mg total) by mouth 3 (three) times a week. 36 tablet 3  . sodium chloride (OCEAN) 0.65 % SOLN nasal spray Place 1 spray into both nostrils daily.    . SUMAtriptan (IMITREX) 50 MG tablet Take 1 tablet (50 mg total) by mouth every 2 (two) hours as needed for migraine. May repeat in 2 hours if headache persists or recurs. 10 tablet 3  . zolpidem (AMBIEN) 10 MG tablet Take 1 tablet (10 mg total) by mouth at bedtime as needed for sleep. 30 tablet 2   No current facility-administered medications for this visit.     Psychiatric Specialty Exam: Review  of Systems  Musculoskeletal: Positive for back pain.  Psychiatric/Behavioral: Positive for decreased  concentration. The patient is nervous/anxious.   All other systems reviewed and are negative.   Last menstrual period 01/08/2013.There is no height or weight on file to calculate BMI.  General Appearance: NA  Eye Contact:  NA  Speech:  Clear and Coherent and Normal Rate  Volume:  Normal  Mood:  Anxious  Affect:  NA  Thought Process:  Goal Directed  Orientation:  Full (Time, Place, and Person)  Thought Content: Logical   Suicidal Thoughts:  No  Homicidal Thoughts:  No  Memory:  Immediate;   Good Recent;   Good Remote;   Good  Judgement:  Good  Insight:  Good  Psychomotor Activity:  NA  Concentration:  Concentration: Fair  Recall:  Good  Fund of Knowledge: Good  Language: Good  Akathisia:  Negative  Handed:  Right  AIMS (if indicated): not done  Assets:  Communication Skills Desire for Improvement Financial Resources/Insurance Housing Intimacy Social Support Talents/Skills Vocational/Educational  ADL's:  Intact  Cognition: WNL  Sleep:  Fair    Assessment and Plan: 43yo married AAFwith along standinghx ofdepression (on and off) and generalized anxiety. She used to have frequent panic attacks andis prescribed alprazolam1mg  bid prn these. They are much less frequent now but they still occur occasionally. She is on buspirone and fluoxetine combination for depression/anxiety. In thepast she tried citalopram and venlafaxine but could not tolerate excessive sweating and other adverse effects which now, in perspective, she thinks might have been more related to her menopausal symptoms. 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 She has never been formally diagnosed with ADD. We added Adderall XR30 mg and she noticedimprovementinfocusing ability, less fatigue and less anxiety as well.She would  like to try a higher dose though as focusing ability  "could be better".  Dx: GAD; Panic disorder; ADD adult type; MDD recurrent, mild  Plan: Continue Prozac 60 mg daily, Buspar 15 mg bid, zolpidem 10 mg prn sleep,alprazolamto1mg bidprn panic attacksandincrease AdderallXRto40mg  daily.Next appointment inthreemonths.The plan was discussed with patient who had an opportunity to ask questions and these were all answered. I spend16minutes inphone consultationwith the patient    Magdalene Patricia, MD 03/20/2020, 3:57 PM

## 2020-03-20 NOTE — Telephone Encounter (Signed)
Reached out to patient who states she wants her cpap order sent to [facility]. I will pull the order back from [facility].

## 2020-03-21 ENCOUNTER — Other Ambulatory Visit: Payer: Self-pay

## 2020-03-21 ENCOUNTER — Ambulatory Visit
Admission: RE | Admit: 2020-03-21 | Discharge: 2020-03-21 | Disposition: A | Discharge status: Home / Self Care | Payer: 59 | Source: Ambulatory Visit | Attending: Family Medicine | Admitting: Family Medicine

## 2020-03-21 DIAGNOSIS — M5416 Radiculopathy, lumbar region: Secondary | ICD-10-CM

## 2020-03-21 MED ORDER — IOPAMIDOL (ISOVUE-M 200) INJECTION 41%
1.0000 mL | Freq: Once | INTRAMUSCULAR | Status: AC
  Administered 2020-03-21: 1 mL via EPIDURAL

## 2020-03-21 MED ORDER — METHYLPREDNISOLONE ACETATE 40 MG/ML INJ SUSP (RADIOLOG
120.0000 mg | Freq: Once | INTRAMUSCULAR | Status: AC
  Administered 2020-03-21: 120 mg via EPIDURAL

## 2020-03-21 NOTE — Discharge Instructions (Signed)

## 2020-03-22 ENCOUNTER — Other Ambulatory Visit: Payer: Self-pay | Admitting: Medical

## 2020-03-26 ENCOUNTER — Other Ambulatory Visit: Payer: Self-pay | Admitting: Family Medicine

## 2020-03-26 MED ORDER — HYDROCODONE-ACETAMINOPHEN 7.5-325 MG PO TABS: 1.0000 | ORAL_TABLET | Freq: Four times a day (QID) | ORAL | 0 refills | Status: DC | PRN

## 2020-03-29 ENCOUNTER — Other Ambulatory Visit (HOSPITAL_COMMUNITY): Payer: Self-pay | Admitting: Psychiatry

## 2020-03-29 MED ORDER — AMPHETAMINE-DEXTROAMPHET ER 30 MG PO CP24: 30.0000 mg | ORAL_CAPSULE | ORAL | 0 refills | Status: DC

## 2020-03-30 ENCOUNTER — Telehealth (HOSPITAL_COMMUNITY): Payer: Self-pay | Admitting: *Deleted

## 2020-03-30 NOTE — Telephone Encounter (Signed)
PA for Adderall XR 20mg  approved. PA # . Covered through 03/27/21.

## 2020-04-02 ENCOUNTER — Ambulatory Visit: Payer: 59 | Admitting: Skilled Nursing Facility1

## 2020-04-11 ENCOUNTER — Other Ambulatory Visit: Payer: Self-pay | Admitting: Family Medicine

## 2020-04-24 ENCOUNTER — Other Ambulatory Visit: Payer: Self-pay | Admitting: Medical

## 2020-04-24 ENCOUNTER — Other Ambulatory Visit: Payer: Self-pay | Admitting: Family Medicine

## 2020-04-26 ENCOUNTER — Other Ambulatory Visit: Payer: Self-pay | Admitting: Family Medicine

## 2020-04-26 MED ORDER — HYDROCODONE-ACETAMINOPHEN 7.5-325 MG PO TABS: 1.0000 | ORAL_TABLET | Freq: Four times a day (QID) | ORAL | 0 refills | Status: DC | PRN

## 2020-05-16 ENCOUNTER — Other Ambulatory Visit: Payer: Self-pay

## 2020-05-16 ENCOUNTER — Telehealth (INDEPENDENT_AMBULATORY_CARE_PROVIDER_SITE_OTHER): Payer: 59 | Admitting: Psychiatry

## 2020-05-16 DIAGNOSIS — F909 Attention-deficit hyperactivity disorder, unspecified type: Secondary | ICD-10-CM | POA: Diagnosis not present

## 2020-05-16 DIAGNOSIS — F410 Panic disorder [episodic paroxysmal anxiety] without agoraphobia: Secondary | ICD-10-CM | POA: Diagnosis not present

## 2020-05-16 DIAGNOSIS — F411 Generalized anxiety disorder: Secondary | ICD-10-CM | POA: Diagnosis not present

## 2020-05-16 DIAGNOSIS — F3341 Major depressive disorder, recurrent, in partial remission: Secondary | ICD-10-CM | POA: Diagnosis not present

## 2020-05-16 MED ORDER — BUSPIRONE HCL 15 MG PO TABS: 15.0000 mg | ORAL_TABLET | Freq: Two times a day (BID) | ORAL | 2 refills | Status: DC

## 2020-05-16 MED ORDER — AMPHETAMINE-DEXTROAMPHET ER 30 MG PO CP24
60.0000 mg | ORAL_CAPSULE | ORAL | 0 refills | Status: DC
Start: 2020-07-15 — End: 2020-08-17

## 2020-05-16 MED ORDER — AMPHETAMINE-DEXTROAMPHET ER 30 MG PO CP24
60.0000 mg | ORAL_CAPSULE | ORAL | 0 refills | Status: DC
Start: 2020-05-16 — End: 2020-08-17

## 2020-05-16 MED ORDER — ZOLPIDEM TARTRATE 10 MG PO TABS: 10.0000 mg | ORAL_TABLET | Freq: Every evening | ORAL | 2 refills | Status: DC | PRN

## 2020-05-16 MED ORDER — ALPRAZOLAM 1 MG PO TABS: 1.0000 mg | ORAL_TABLET | Freq: Two times a day (BID) | ORAL | 2 refills | Status: DC | PRN

## 2020-05-16 MED ORDER — AMPHETAMINE-DEXTROAMPHET ER 30 MG PO CP24
60.0000 mg | ORAL_CAPSULE | ORAL | 0 refills | Status: DC
Start: 2020-06-15 — End: 2020-08-17

## 2020-05-16 NOTE — Progress Notes (Signed)
BH MD/PA/NP OP Progress Note  05/16/2020 9:21 AM DEIJA BUHRMAN  MRN:  [id number] Interview was conducted by phone and I verified that I was speaking with the correct person using two identifiers. I discussed the limitations of evaluation and management by telemedicine and  the availability of in person appointments. Patient expressed understanding and agreed to proceed. Patient location - home; physician - home office.  Chief Complaint: Episodic anxiety.  HPI: 44yo married AAFwith along standinghx ofdepression (on and off) and generalized anxiety. She used to have frequent panic attacks andis prescribed alprazolam1mg  bid prn these. They are much less frequent now but they still occur occasionally. She is on buspirone and fluoxetine combination for depression/anxiety. In thepast she tried citalopram and venlafaxine but could not tolerate excessive sweating and other adverse effects which now, in perspective, she thinks might have been more related to her menopausal symptoms. 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 She has never been formally diagnosed with ADD.We added Adderall XRand gradually arrived at 60 mg daily as the dose which provides optimal benefit: improvementinfocusing ability, less fatigue and less anxiety as well.She said she feels finally "nor,mal". Anxiety however still is present and she continues to use alprazolam (less need for zolpidem as sleep has improved).    Visit Diagnosis:    ICD-10-CM   1. Adult ADHD  F90.9   2. GAD (generalized anxiety disorder)  F41.1   3. Major depressive disorder, recurrent episode, in partial remission (HCC)  F33.41   4. Panic disorder without agoraphobia  F41.0     Past Psychiatric History: Please see intake H&P.  Past Medical History:  Past  Medical History:  Diagnosis Date  . Abnormal serum level of alkaline phosphatase 05/21/2014  . Acute low back pain 12/19/2010  . ALLERGIC RHINITIS 10/16/2010  . Anxiety and depression 06/07/2011  . Atrophic vaginitis 08/17/2016  . Cervical cancer screening 01/10/2014   Menarche at 8 Regular and moderate flow, became irregular until OCPs  history of abnormal pap in past, repeat was normal Last pap roughly 3 years ago G0P0, s/p No history of abnormal MGM, no h/o MGM  No concerns today no gyn surgeries LMP May 2013  . CTS (carpal tunnel syndrome) 01/15/2014  . Diabetes mellitus type 2 in obese (HCC) 10/30/2011  . Dry eyes 08/17/2016  . Early menopause   . Essential hypertension   . HIATAL HERNIA WITH REFLUX, HX OF 10/16/2010  . Hyperlipidemia 04/21/2011  . Inappropriate sinus tachycardia   . INSOMNIA 10/16/2010  . Knee pain, left 03/11/2012  . Migraine 03/07/2013  . Morbid obesity (HCC)   . Palpitations 01/28/2015  . Reflux 04/22/2012  . Sleep apnea 06/07/2011  . Tobacco use disorder 02/10/2017  . Urinary urgency 08/17/2016  . UTI'S, HX OF 10/16/2010    Past Surgical History:  Procedure Laterality Date  . CHOLECYSTECTOMY      Family Psychiatric History: Reviewed.  Family History:  Family History  Problem Relation Age of Onset  . Lupus Mother   . Arthritis Maternal Grandmother   . Scleroderma Maternal Grandmother   . Glaucoma Maternal Grandmother   . Cataracts Maternal Grandmother   . Cancer Maternal Grandfather        prostate  . Coronary artery disease Maternal Grandfather        s/p angioplasty  . Diabetes Maternal Grandfather  Type 2  . Cancer Paternal Grandmother        Breast ca- dx in 21's  . Thyroid disease Paternal Grandmother   . Breast cancer Paternal Grandmother   . Diabetes Paternal Grandfather   . Thyroid disease Sister   . Asthma Sister   . Allergies Brother   . Cancer Father        cigarette, alcohol HEENT squamous cell  . Alcohol abuse Father   . Arthritis Other    . Alcohol abuse Maternal Aunt   . Alcohol abuse Maternal Uncle     Social History:  Social History   Socioeconomic History  . Marital status: Married    Spouse name: Not on file  . Number of children: Not on file  . Years of education: Not on file  . Highest education level: Not on file  Occupational History  . Not on file  Tobacco Use  . Smoking status: Current Some Day Smoker    Packs/day: 0.10    Types: Cigars  . Smokeless tobacco: Never Used  . Tobacco comment: 1 black and milds  Vaping Use  . Vaping Use: Never used  Substance and Sexual Activity  . Alcohol use: Yes    Comment: twice/year  . Drug use: No  . Sexual activity: Not on file  Other Topics Concern  . Not on file  Social History Narrative   Married lives w/ husband, works at [employer]   Social Determinants of Corporate investment banker Strain:   . Difficulty of Paying Living Expenses: Not on file  Food Insecurity:   . Worried About Programme researcher, broadcasting/film/video in the Last Year: Not on file  . Ran Out of Food in the Last Year: Not on file  Transportation Needs:   . Lack of Transportation (Medical): Not on file  . Lack of Transportation (Non-Medical): Not on file  Physical Activity:   . Days of Exercise per Week: Not on file  . Minutes of Exercise per Session: Not on file  Stress:   . Feeling of Stress : Not on file  Social Connections:   . Frequency of Communication with Friends and Family: Not on file  . Frequency of Social Gatherings with Friends and Family: Not on file  . Attends Religious Services: Not on file  . Active Member of Clubs or Organizations: Not on file  . Attends Banker Meetings: Not on file  . Marital Status: Not on file    Allergies: No Known Allergies  Metabolic Disorder Labs: Lab Results  Component Value Date   HGBA1C 6.9 (H) 10/14/2018   MPG 140 (H) 05/19/2014   MPG 148 (H) 10/14/2013   No results found for: PROLACTIN Lab Results  Component Value Date    CHOL 174 09/19/2019   TRIG 126 09/19/2019   HDL 65 09/19/2019   CHOLHDL 2.7 09/19/2019   VLDL 35.4 01/14/2018   LDLCALC 87 09/19/2019   LDLCALC 61 01/14/2018   Lab Results  Component Value Date   TSH 1.590 09/19/2019   TSH 1.29 01/14/2018    Therapeutic Level Labs: No results found for: LITHIUM No results found for: VALPROATE No components found for:  CBMZ  Current Medications: Current Outpatient Medications  Medication Sig Dispense Refill  . ACETAMINOPHEN-BUTALBITAL 50-325 MG TABS Take 1 tablet by mouth 2 (two) times daily as needed (HEADACHES). 60 tablet 0  . [START ON 06/18/2020] ALPRAZolam (XANAX) 1 MG tablet Take 1 tablet (1 mg total) by mouth 2 (  two) times daily as needed for anxiety. 60 tablet 2  . amphetamine-dextroamphetamine (ADDERALL XR) 30 MG 24 hr capsule Take 2 capsules (60 mg total) by mouth every morning. 60 capsule 0  . [START ON 06/15/2020] amphetamine-dextroamphetamine (ADDERALL XR) 30 MG 24 hr capsule Take 2 capsules (60 mg total) by mouth every morning. 60 capsule 0  . [START ON 07/15/2020] amphetamine-dextroamphetamine (ADDERALL XR) 30 MG 24 hr capsule Take 2 capsules (60 mg total) by mouth every morning. 60 capsule 0  . baclofen (LIORESAL) 10 MG tablet Take 1 tablet (10 mg total) by mouth 2 (two) times daily as needed for muscle spasms. 30 each 2  . BLACK COHOSH PO Take 1 tablet by mouth daily.    Melene Muller ON 06/18/2020] busPIRone (BUSPAR) 15 MG tablet Take 1 tablet (15 mg total) by mouth 2 (two) times daily. 60 tablet 2  . celecoxib (CELEBREX) 200 MG capsule One to 2 tablets by mouth daily as needed for pain. 60 capsule 2  . conjugated estrogens (PREMARIN) vaginal cream Apply small amount to affected area twice a week 42.5 g 1  . cyclobenzaprine (FLEXERIL) 10 MG tablet Take 1 tablet (10 mg total) by mouth 2 (two) times daily as needed for muscle spasms. 60 tablet 2  . FLUoxetine (PROZAC) 20 MG capsule TAKE 1 CAPSULE(20 MG) BY MOUTH THREE TIMES DAILY 90 capsule  5  . fluticasone (FLONASE) 50 MCG/ACT nasal spray SHAKE LIQUID AND USE 2 SPRAYS IN EACH NOSTRIL DAILY 16 g 1  . gabapentin (NEURONTIN) 600 MG tablet TAKE 1 TABLET(600 MG) BY MOUTH THREE TIMES DAILY 90 tablet 0  . Ginger, Zingiber officinalis, (GINGER PO) Take 1 tablet by mouth daily.    Marland Kitchen HYDROcodone-acetaminophen (NORCO) 7.5-325 MG tablet Take 1 tablet by mouth every 6 (six) hours as needed for moderate pain. 100 tablet 0  . hyoscyamine (LEVSIN SL) 0.125 MG SL tablet Take 1 tablet (0.125 mg total) by mouth every 4 (four) hours as needed. 30 tablet 0  . levocetirizine (XYZAL) 5 MG tablet TAKE 1 TABLET(5 MG) BY MOUTH EVERY EVENING 30 tablet 5  . losartan (COZAAR) 50 MG tablet TAKE 1 TABLET(50 MG) BY MOUTH DAILY 90 tablet 2  . metFORMIN (GLUCOPHAGE) 500 MG tablet TAKE 2 TABLETS BY MOUTH TWICE DAILY 360 tablet 1  . montelukast (SINGULAIR) 10 MG tablet TAKE 1 TABLET(10 MG) BY MOUTH AT BEDTIME 90 tablet 1  . omeprazole (PRILOSEC) 20 MG capsule Take 1 capsule (20 mg total) by mouth 2 (two) times daily before a meal. 180 capsule 3  . ondansetron (ZOFRAN) 4 MG tablet TAKE 1 TABLET(4 MG) BY MOUTH EVERY 8 HOURS AS NEEDED FOR NAUSEA OR VOMITING 40 tablet 0  . Probiotic Product (PROBIOTIC PO) Take 1 tablet by mouth daily.    . promethazine (PHENERGAN) 25 MG tablet TAKE 1 TABLET BY MOUTH EVERY 8 HOURS AS NEEDED FOR NAUSEA OR VOMITING 20 tablet 0  . propranolol (INDERAL) 60 MG tablet Take 1 tablet (60 mg total) by mouth 2 (two) times daily. 180 tablet 2  . rosuvastatin (CRESTOR) 5 MG tablet Take 1 tablet (5 mg total) by mouth 3 (three) times a week. 36 tablet 3  . sodium chloride (OCEAN) 0.65 % SOLN nasal spray Place 1 spray into both nostrils daily.    . SUMAtriptan (IMITREX) 50 MG tablet Take 1 tablet (50 mg total) by mouth every 2 (two) hours as needed for migraine. May repeat in 2 hours if headache persists or recurs. 10 tablet 3  . [  START ON 06/18/2020] zolpidem (AMBIEN) 10 MG tablet Take 1 tablet (10 mg  total) by mouth at bedtime as needed for sleep. 30 tablet 2   No current facility-administered medications for this visit.      Psychiatric Specialty Exam: Review of Systems  Musculoskeletal: Positive for back pain.  Psychiatric/Behavioral: The patient is nervous/anxious.   All other systems reviewed and are negative.   Last menstrual period 01/08/2013.There is no height or weight on file to calculate BMI.  General Appearance: NA  Eye Contact:  NA  Speech:  Clear and Coherent and Normal Rate  Volume:  Normal  Mood:  Episodic anxiety  Affect:  NA  Thought Process:  Goal Directed and Linear  Orientation:  Full (Time, Place, and Person)  Thought Content: Logical   Suicidal Thoughts:  No  Homicidal Thoughts:  No  Memory:  Immediate;   Good Recent;   Good Remote;   Good  Judgement:  Good  Insight:  Good  Psychomotor Activity:  NA  Concentration:  Concentration: Good  Recall:  Good  Fund of Knowledge: Good  Language: Good  Akathisia:  Negative  Handed:  Right  AIMS (if indicated): not done  Assets:  Communication Skills Desire for Improvement Financial Resources/Insurance Housing Social Support Vocational/Educational  ADL's:  Intact  Cognition: WNL  Sleep:  Fair    Assessment and Plan: 43yo married AAFwith along standinghx ofdepression (on and off) and generalized anxiety. She used to have frequent panic attacks andis prescribed alprazolam1mg  bid prn these. They are much less frequent now but they still occur occasionally. She is on buspirone and fluoxetine combination for depression/anxiety. In thepast she tried citalopram and venlafaxine but could not tolerate excessive sweating and other adverse effects which now, in perspective, she thinks might have been more related to her menopausal symptoms. 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 She has never been formally diagnosed with ADD.We added Adderall XRand gradually arrived at 60 mg daily as the dose which provides optimal benefit: improvementinfocusing ability, less fatigue and less anxiety as well.She said she feels finally "nor,mal". Anxiety however still is present and she continues to use alprazolam (less need for zolpidem as sleep has improved).  Dx: GAD; Panic disorder; ADD adult type; MDD recurrent, mild  Plan: Continue Prozac 60 mg daily, Buspar 15 mg bid, zolpidem 10 mg prn sleep,alprazolamto1mg bidprn panic attacksandincrease AdderallXRto60mg  daily.Next appointment inthreemonths.The plan was discussed with patient who had an opportunity to ask questions and these were all answered. I spend8315minutes inphone consultationwith the patient.    Magdalene Patricialgierd A Bailley [place], MD 05/16/2020, 9:21 AM

## 2020-05-22 ENCOUNTER — Other Ambulatory Visit: Payer: Self-pay | Admitting: Family Medicine

## 2020-05-22 ENCOUNTER — Encounter: Payer: Self-pay | Admitting: Family Medicine

## 2020-05-22 DIAGNOSIS — M5416 Radiculopathy, lumbar region: Secondary | ICD-10-CM

## 2020-05-22 NOTE — Progress Notes (Signed)
Placed epidural for pain.   Myra Rude, MD [facility] 05/22/2020, 8:21 AM

## 2020-05-24 ENCOUNTER — Ambulatory Visit
Admission: RE | Admit: 2020-05-24 | Discharge: 2020-05-24 | Disposition: A | Discharge status: Home / Self Care | Payer: 59 | Source: Ambulatory Visit | Attending: Family Medicine | Admitting: Family Medicine

## 2020-05-24 ENCOUNTER — Other Ambulatory Visit: Payer: Self-pay | Admitting: Medical

## 2020-05-24 ENCOUNTER — Other Ambulatory Visit: Payer: Self-pay | Admitting: Cardiology

## 2020-05-24 ENCOUNTER — Other Ambulatory Visit: Payer: Self-pay

## 2020-05-24 DIAGNOSIS — M5416 Radiculopathy, lumbar region: Secondary | ICD-10-CM

## 2020-05-24 DIAGNOSIS — I10 Essential (primary) hypertension: Secondary | ICD-10-CM

## 2020-05-24 DIAGNOSIS — E782 Mixed hyperlipidemia: Secondary | ICD-10-CM

## 2020-05-24 DIAGNOSIS — R000 Tachycardia, unspecified: Secondary | ICD-10-CM

## 2020-05-24 MED ORDER — IOPAMIDOL (ISOVUE-M 200) INJECTION 41%
1.0000 mL | Freq: Once | INTRAMUSCULAR | Status: AC
  Administered 2020-05-24: 1 mL via EPIDURAL

## 2020-05-24 MED ORDER — METHYLPREDNISOLONE ACETATE 40 MG/ML INJ SUSP (RADIOLOG
120.0000 mg | Freq: Once | INTRAMUSCULAR | Status: AC
  Administered 2020-05-24: 120 mg via EPIDURAL

## 2020-05-24 NOTE — Discharge Instructions (Signed)

## 2020-06-04 ENCOUNTER — Other Ambulatory Visit: Payer: Self-pay | Admitting: Family Medicine

## 2020-06-04 MED ORDER — PROMETHAZINE HCL 25 MG PO TABS: 25.0000 mg | ORAL_TABLET | Freq: Three times a day (TID) | ORAL | 1 refills | Status: DC | PRN

## 2020-06-04 MED ORDER — HYDROCODONE-ACETAMINOPHEN 7.5-325 MG PO TABS: 1.0000 | ORAL_TABLET | Freq: Four times a day (QID) | ORAL | 0 refills | Status: DC | PRN

## 2020-06-04 NOTE — Telephone Encounter (Signed)
Hydrocodone  Last written: 04/26/20 Last ov: 01/04/20 Next ov: none Contract: due UDS: due

## 2020-06-20 ENCOUNTER — Telehealth (HOSPITAL_COMMUNITY): Payer: 59 | Admitting: Psychiatry

## 2020-06-25 ENCOUNTER — Other Ambulatory Visit: Payer: Self-pay | Admitting: Medical

## 2020-07-03 ENCOUNTER — Other Ambulatory Visit: Payer: Self-pay | Admitting: Family Medicine

## 2020-07-03 DIAGNOSIS — M5416 Radiculopathy, lumbar region: Secondary | ICD-10-CM

## 2020-07-03 MED ORDER — HYDROCODONE-ACETAMINOPHEN 7.5-325 MG PO TABS: 1.0000 | ORAL_TABLET | Freq: Four times a day (QID) | ORAL | 0 refills | Status: DC | PRN

## 2020-07-03 MED ORDER — ONDANSETRON HCL 4 MG PO TABS: 4.0000 mg | ORAL_TABLET | Freq: Three times a day (TID) | ORAL | 1 refills | Status: DC | PRN

## 2020-07-03 NOTE — Telephone Encounter (Signed)
Requesting: hydrocodone 7.5-325mg  Contract: 09/10/2017 UDS: 05/04/2018  Last Visit: 01/04/2020 Next Visit: None scheduled Last Refill: 06/04/2020 #100 and 0RF Pt sig: 1 tab q6h prn  Please Advise

## 2020-07-03 NOTE — Telephone Encounter (Signed)
I have refilled her pain meds but it has been six months since seen so whe will need an appt for any further refills

## 2020-07-06 ENCOUNTER — Other Ambulatory Visit: Payer: Self-pay | Admitting: Family Medicine

## 2020-07-07 ENCOUNTER — Other Ambulatory Visit: Payer: Self-pay | Admitting: Family Medicine

## 2020-07-07 ENCOUNTER — Other Ambulatory Visit: Payer: Self-pay | Admitting: Cardiology

## 2020-07-19 IMAGING — DX DG HIP (WITH OR WITHOUT PELVIS) 2-3V*R*
3 series · 3 of 3 positions shown · non-contrast
Comparison: None.

CLINICAL DATA: Pain

EXAM:
DG HIP (WITH OR WITHOUT PELVIS) 2-3V RIGHT

[pelvis ap]
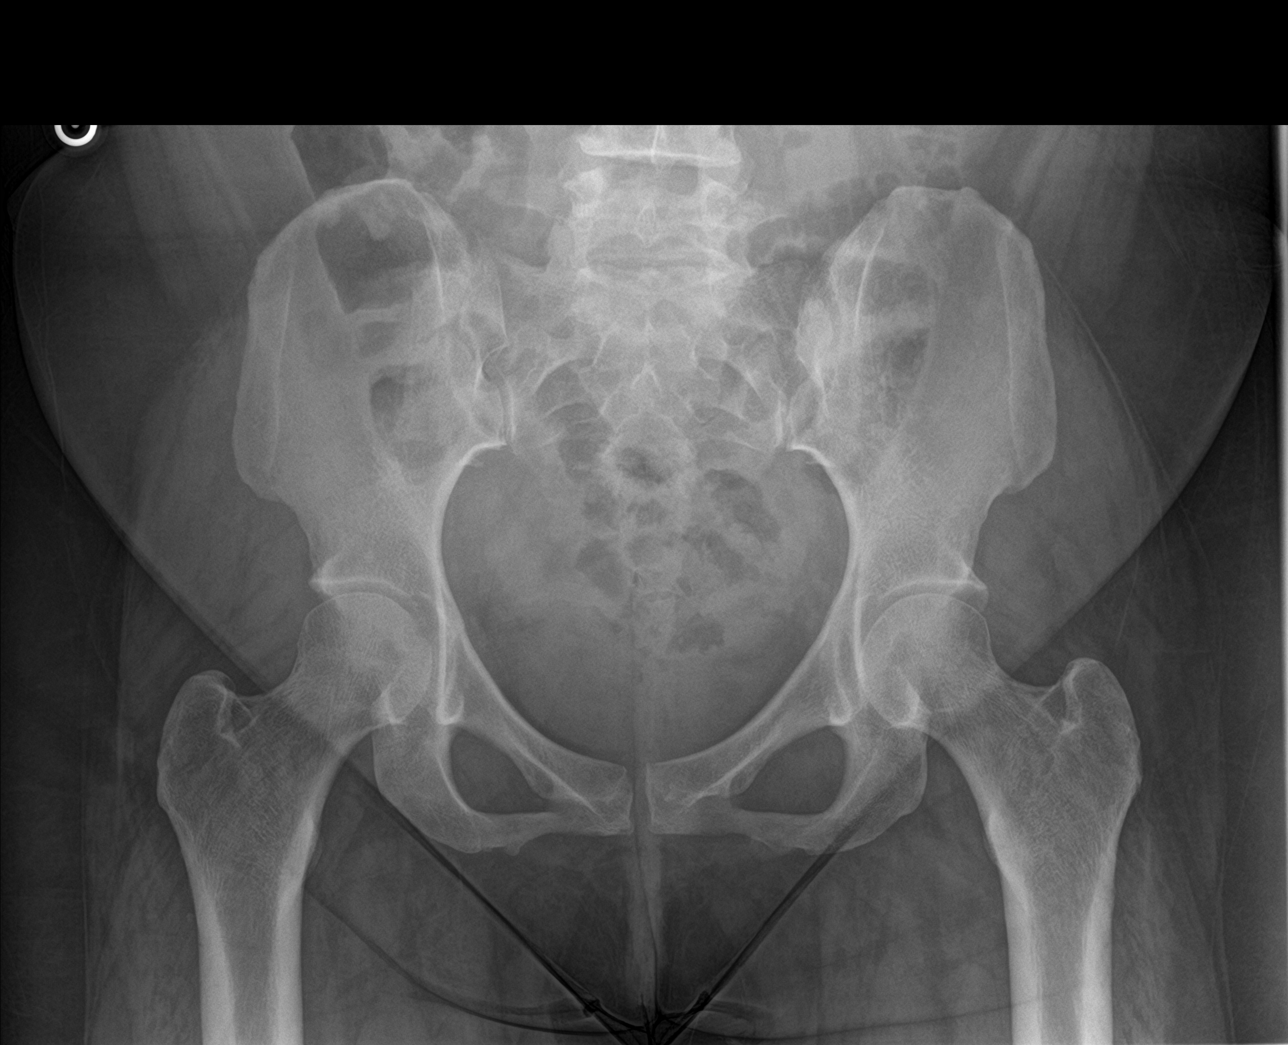

[hip ap]
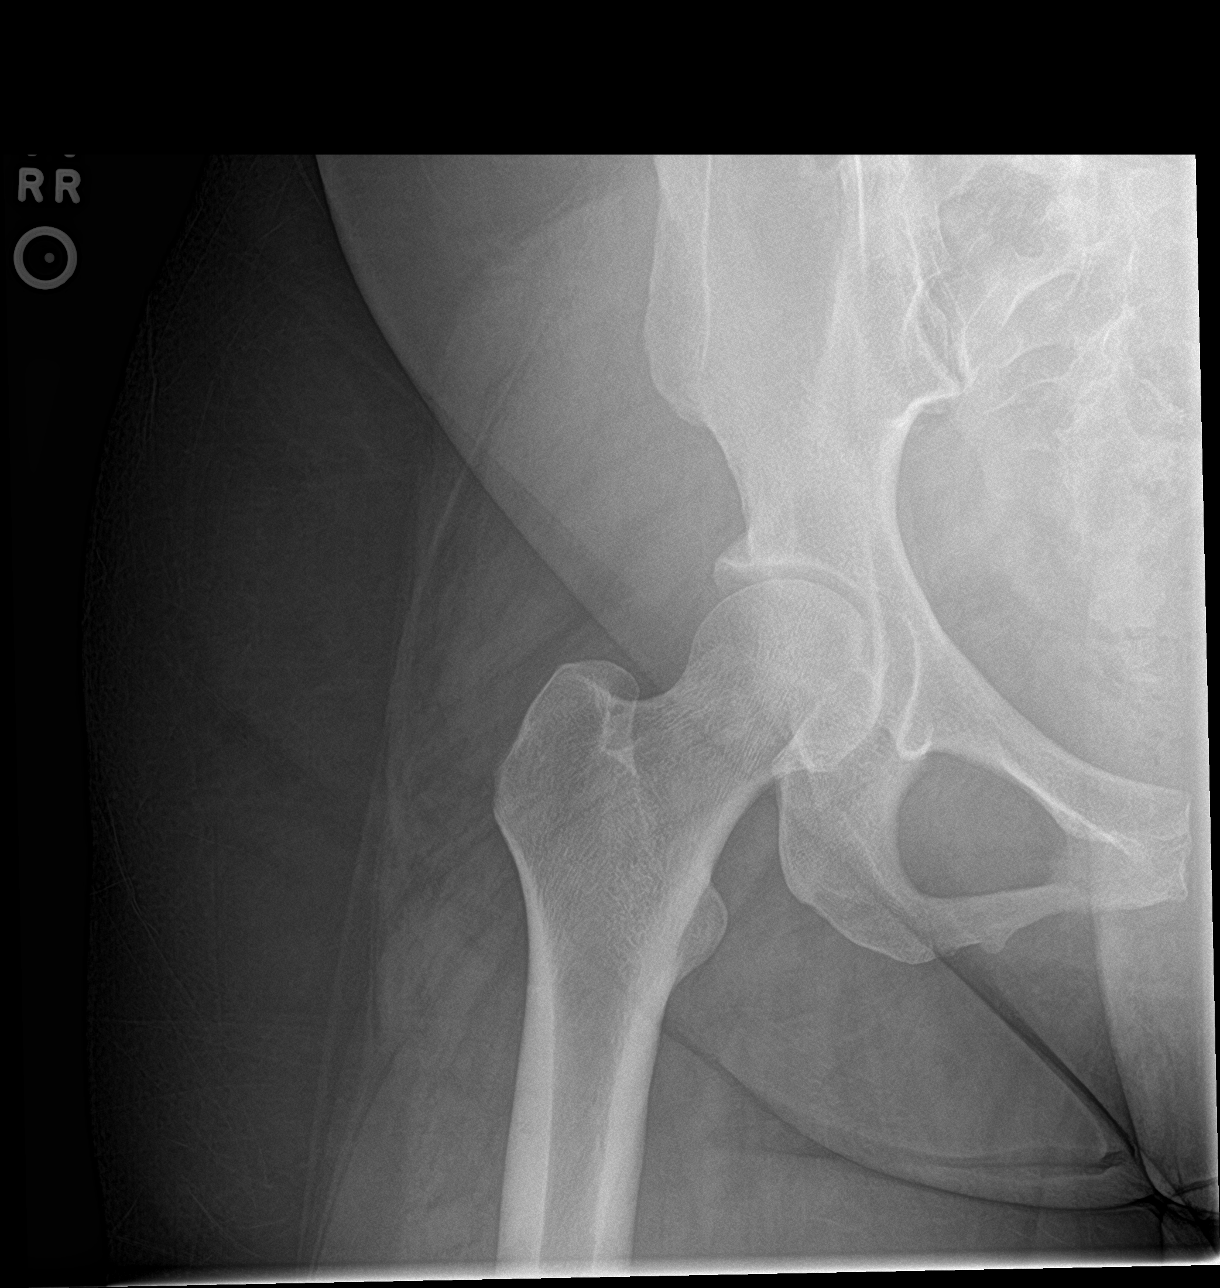

[hip lat]
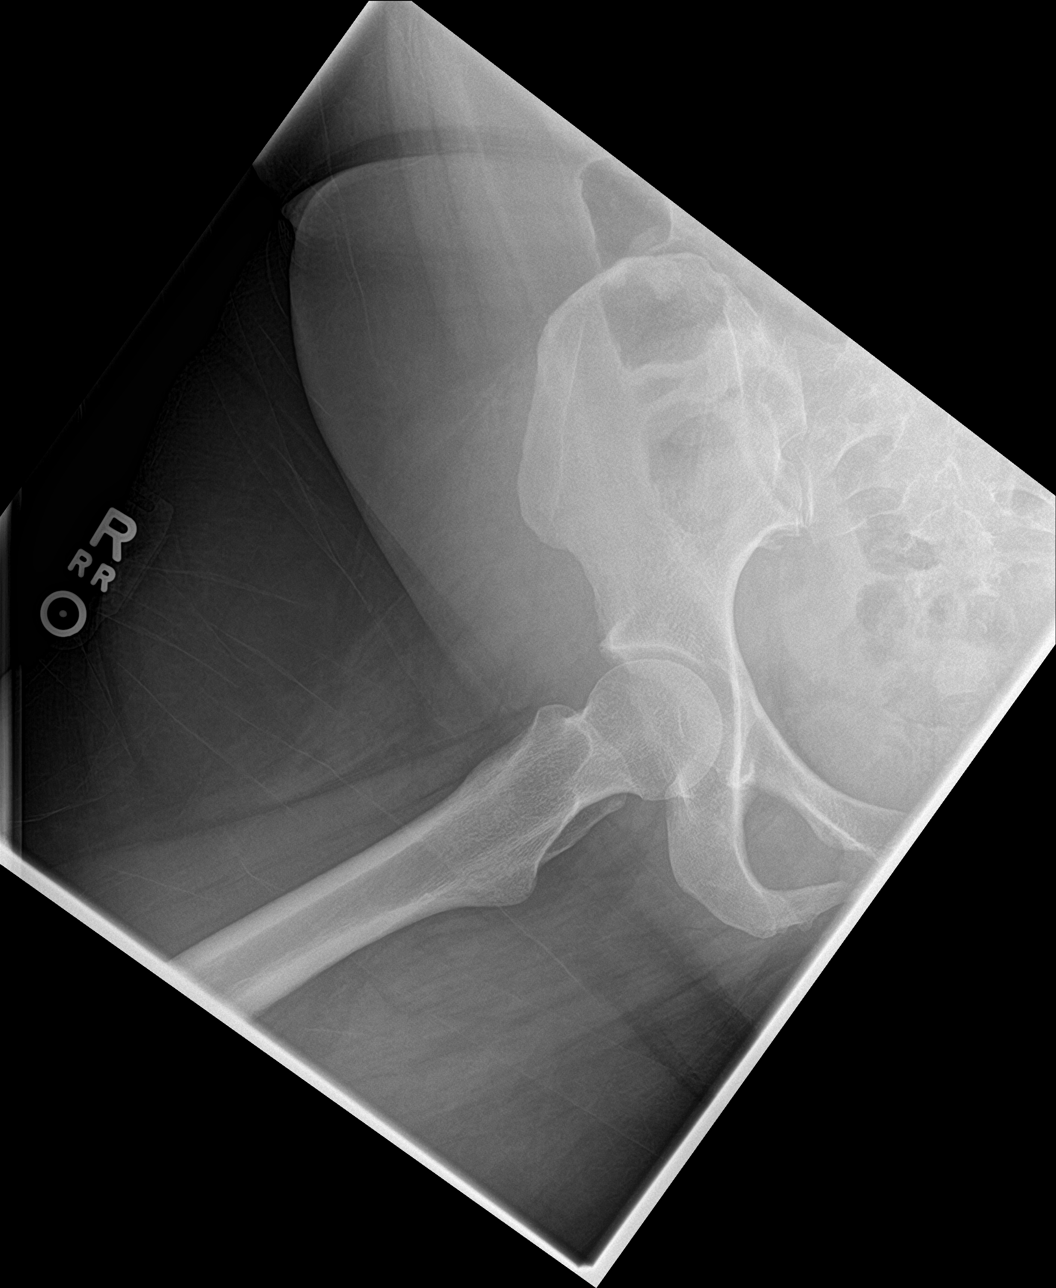

[3 of 3 positions shown; findings below may reference images not displayed]

FINDINGS: Frontal pelvis as well as frontal and lateral right hip images were
obtained. No fracture or dislocation. Joint spaces appear normal. No
erosive change.
IMPRESSION: No fracture or dislocation.  No appreciable arthropathy.

## 2020-07-26 ENCOUNTER — Telehealth: Payer: Self-pay | Admitting: Family Medicine

## 2020-07-26 DIAGNOSIS — M5416 Radiculopathy, lumbar region: Secondary | ICD-10-CM

## 2020-07-26 NOTE — Telephone Encounter (Signed)
Placed order for epidural.   Myra Rude, MD [facility] 07/26/2020, 3:21 PM

## 2020-07-26 NOTE — Telephone Encounter (Signed)
Pt called states its time for another Epidural injection (last one  05/22/20)  --Forwarding request to provider for:   DG INJECT DIAG/THERA/INC NEEDLE/CATH/PLC EPI/LUMB/SAC W/IMG  --Nicole Ball

## 2020-07-28 ENCOUNTER — Other Ambulatory Visit: Payer: Self-pay | Admitting: Medical

## 2020-08-07 ENCOUNTER — Encounter: Payer: Self-pay | Admitting: Family Medicine

## 2020-08-07 ENCOUNTER — Telehealth: Payer: Self-pay | Admitting: Family Medicine

## 2020-08-07 NOTE — Telephone Encounter (Signed)
Patient notified order for epidural place & she will need to contact [facility] to set up appt--glh

## 2020-08-08 ENCOUNTER — Other Ambulatory Visit: Payer: Self-pay | Admitting: Family Medicine

## 2020-08-08 MED ORDER — FLUCONAZOLE 150 MG PO TABS
150.0000 mg | ORAL_TABLET | ORAL | 1 refills | Status: DC
Start: 2020-08-08 — End: 2021-04-05

## 2020-08-14 ENCOUNTER — Other Ambulatory Visit: Payer: Self-pay

## 2020-08-14 ENCOUNTER — Ambulatory Visit
Admission: RE | Admit: 2020-08-14 | Discharge: 2020-08-14 | Disposition: A | Discharge status: Home / Self Care | Payer: 59 | Source: Ambulatory Visit | Attending: Family Medicine | Admitting: Family Medicine

## 2020-08-14 DIAGNOSIS — M5416 Radiculopathy, lumbar region: Secondary | ICD-10-CM

## 2020-08-14 MED ORDER — METHYLPREDNISOLONE ACETATE 40 MG/ML INJ SUSP (RADIOLOG
120.0000 mg | Freq: Once | INTRAMUSCULAR | Status: AC
  Administered 2020-08-14: 120 mg via EPIDURAL

## 2020-08-14 MED ORDER — IOPAMIDOL (ISOVUE-M 200) INJECTION 41%
1.0000 mL | Freq: Once | INTRAMUSCULAR | Status: AC
  Administered 2020-08-14: 1 mL via EPIDURAL

## 2020-08-14 NOTE — Discharge Instructions (Signed)

## 2020-08-17 ENCOUNTER — Other Ambulatory Visit: Payer: Self-pay

## 2020-08-17 ENCOUNTER — Telehealth (INDEPENDENT_AMBULATORY_CARE_PROVIDER_SITE_OTHER): Payer: 59 | Admitting: Psychiatry

## 2020-08-17 DIAGNOSIS — F330 Major depressive disorder, recurrent, mild: Secondary | ICD-10-CM

## 2020-08-17 DIAGNOSIS — F411 Generalized anxiety disorder: Secondary | ICD-10-CM

## 2020-08-17 DIAGNOSIS — F410 Panic disorder [episodic paroxysmal anxiety] without agoraphobia: Secondary | ICD-10-CM | POA: Diagnosis not present

## 2020-08-17 DIAGNOSIS — F909 Attention-deficit hyperactivity disorder, unspecified type: Secondary | ICD-10-CM | POA: Diagnosis not present

## 2020-08-17 MED ORDER — AMPHETAMINE-DEXTROAMPHET ER 30 MG PO CP24
60.0000 mg | ORAL_CAPSULE | ORAL | 0 refills | Status: DC
Start: 2020-08-17 — End: 2020-11-06

## 2020-08-17 MED ORDER — BUSPIRONE HCL 15 MG PO TABS: 15.0000 mg | ORAL_TABLET | Freq: Two times a day (BID) | ORAL | 2 refills | Status: DC

## 2020-08-17 MED ORDER — ZOLPIDEM TARTRATE 10 MG PO TABS: 10.0000 mg | ORAL_TABLET | Freq: Every evening | ORAL | 2 refills | Status: DC | PRN

## 2020-08-17 MED ORDER — AMPHETAMINE-DEXTROAMPHET ER 30 MG PO CP24
60.0000 mg | ORAL_CAPSULE | ORAL | 0 refills | Status: DC
Start: 2020-09-16 — End: 2020-11-06

## 2020-08-17 MED ORDER — AMPHETAMINE-DEXTROAMPHET ER 30 MG PO CP24
60.0000 mg | ORAL_CAPSULE | ORAL | 0 refills | Status: DC
Start: 2020-10-16 — End: 2021-03-18

## 2020-08-17 NOTE — Progress Notes (Signed)
BH MD/PA/NP OP Progress Note  08/17/2020 10:16 AM Nicole Ball  MRN:  [id number] Interview was conducted by phone and I verified that I was speaking with the correct person using two identifiers. I discussed the limitations of evaluation and management by telemedicine and  the availability of in person appointments. Patient expressed understanding and agreed to proceed. Participants in the visit: patient (location - home); physician (location - home office).  Chief Complaint: Some anxiety.  HPI: 45yo married AAFwith along standinghx ofdepression (on and off) and generalized anxiety. She used to have frequent panic attacks andis prescribed alprazolam1mg  bid prn these. They are much less frequent now but they still occur occasionally. She is on buspirone and fluoxetine combination for depression/anxiety. In thepast she tried citalopram and venlafaxine but could not tolerate excessive sweating and other adverse effects which now, in perspective, she thinks might have been more related to her menopausal symptoms. 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 She has never been formally diagnosed with ADD.We added Adderall XRand gradually arrived at 60 mg daily as the dose which provides optimal benefit: improvementinfocusing ability, less fatigue. Anxiety however still is present and she continues to use alprazolam (less need for zolpidem as sleep has improved).    Visit Diagnosis:    ICD-10-CM   1. GAD (generalized anxiety disorder)  F41.1   2. Adult ADHD  F90.9   3. Panic disorder without agoraphobia  F41.0   4. Major depressive disorder, recurrent, mild (HCC)  F33.0     Past Psychiatric History: Please see intake H&P>  Past Medical History:  Past Medical History:  Diagnosis Date  . Abnormal serum level  of alkaline phosphatase 05/21/2014  . Acute low back pain 12/19/2010  . ALLERGIC RHINITIS 10/16/2010  . Anxiety and depression 06/07/2011  . Atrophic vaginitis 08/17/2016  . Cervical cancer screening 01/10/2014   Menarche at 8 Regular and moderate flow, became irregular until OCPs  history of abnormal pap in past, repeat was normal Last pap roughly 3 years ago G0P0, s/p No history of abnormal MGM, no h/o MGM  No concerns today no gyn surgeries LMP May 2013  . CTS (carpal tunnel syndrome) 01/15/2014  . Diabetes mellitus type 2 in obese (HCC) 10/30/2011  . Dry eyes 08/17/2016  . Early menopause   . Essential hypertension   . HIATAL HERNIA WITH REFLUX, HX OF 10/16/2010  . Hyperlipidemia 04/21/2011  . Inappropriate sinus tachycardia   . INSOMNIA 10/16/2010  . Knee pain, left 03/11/2012  . Migraine 03/07/2013  . Morbid obesity (HCC)   . Palpitations 01/28/2015  . Reflux 04/22/2012  . Sleep apnea 06/07/2011  . Tobacco use disorder 02/10/2017  . Urinary urgency 08/17/2016  . UTI'S, HX OF 10/16/2010    Past Surgical History:  Procedure Laterality Date  . CHOLECYSTECTOMY      Family Psychiatric History: Reviewed.  Family History:  Family History  Problem Relation Age of Onset  . Lupus Mother   . Arthritis Maternal Grandmother   . Scleroderma Maternal Grandmother   . Glaucoma Maternal Grandmother   . Cataracts Maternal Grandmother   . Cancer Maternal Grandfather        prostate  . Coronary artery disease Maternal Grandfather        s/p angioplasty  . Diabetes Maternal Grandfather        Type 2  .  Cancer Paternal Grandmother        Breast ca- dx in 4's  . Thyroid disease Paternal Grandmother   . Breast cancer Paternal Grandmother   . Diabetes Paternal Grandfather   . Thyroid disease Sister   . Asthma Sister   . Allergies Brother   . Cancer Father        cigarette, alcohol HEENT squamous cell  . Alcohol abuse Father   . Arthritis Other   . Alcohol abuse Maternal Aunt   . Alcohol abuse Maternal  Uncle     Social History:  Social History   Socioeconomic History  . Marital status: Married    Spouse name: Not on file  . Number of children: Not on file  . Years of education: Not on file  . Highest education level: Not on file  Occupational History  . Not on file  Tobacco Use  . Smoking status: Current Some Day Smoker    Packs/day: 0.10    Types: Cigars  . Smokeless tobacco: Never Used  . Tobacco comment: 1 black and milds  Vaping Use  . Vaping Use: Never used  Substance and Sexual Activity  . Alcohol use: Yes    Comment: twice/year  . Drug use: No  . Sexual activity: Not on file  Other Topics Concern  . Not on file  Social History Narrative   Married lives w/ husband, works at [employer]   Social Determinants of Health   Financial Resource Strain: Not on file  Food Insecurity: Not on file  Transportation Needs: Not on file  Physical Activity: Not on file  Stress: Not on file  Social Connections: Not on file    Allergies: No Known Allergies  Metabolic Disorder Labs: Lab Results  Component Value Date   HGBA1C 6.9 (H) 10/14/2018   MPG 140 (H) 05/19/2014   MPG 148 (H) 10/14/2013   No results found for: PROLACTIN Lab Results  Component Value Date   CHOL 174 09/19/2019   TRIG 126 09/19/2019   HDL 65 09/19/2019   CHOLHDL 2.7 09/19/2019   VLDL 31.0 01/14/2018   LDLCALC 87 09/19/2019   LDLCALC 61 01/14/2018   Lab Results  Component Value Date   TSH 1.590 09/19/2019   TSH 1.29 01/14/2018    Therapeutic Level Labs: No results found for: LITHIUM No results found for: VALPROATE No components found for:  CBMZ  Current Medications: Current Outpatient Medications  Medication Sig Dispense Refill  . amphetamine-dextroamphetamine (ADDERALL XR) 30 MG 24 hr capsule Take 2 capsules (60 mg total) by mouth every morning. 60 capsule 0  . [START ON 09/16/2020] amphetamine-dextroamphetamine (ADDERALL XR) 30 MG 24 hr capsule Take 2 capsules (60 mg total) by  mouth every morning. 60 capsule 0  . [START ON 10/16/2020] amphetamine-dextroamphetamine (ADDERALL XR) 30 MG 24 hr capsule Take 2 capsules (60 mg total) by mouth every morning. 60 capsule 0  . ACETAMINOPHEN-BUTALBITAL 50-325 MG TABS Take 1 tablet by mouth 2 (two) times daily as needed (HEADACHES). 60 tablet 0  . ALPRAZolam (XANAX) 1 MG tablet Take 1 tablet (1 mg total) by mouth 2 (two) times daily as needed for anxiety. 60 tablet 2  . baclofen (LIORESAL) 10 MG tablet Take 1 tablet (10 mg total) by mouth 2 (two) times daily as needed for muscle spasms. 30 each 2  . BLACK COHOSH PO Take 1 tablet by mouth daily.    Derrill Memo ON 09/16/2020] busPIRone (BUSPAR) 15 MG tablet Take 1 tablet (15 mg total)  by mouth 2 (two) times daily. 60 tablet 2  . celecoxib (CELEBREX) 200 MG capsule One to 2 tablets by mouth daily as needed for pain. 60 capsule 2  . conjugated estrogens (PREMARIN) vaginal cream Apply small amount to affected area twice a week 42.5 g 1  . cyclobenzaprine (FLEXERIL) 10 MG tablet Take 1 tablet (10 mg total) by mouth 2 (two) times daily as needed for muscle spasms. 60 tablet 2  . fluconazole (DIFLUCAN) 150 MG tablet Take 1 tablet (150 mg total) by mouth once a week. 4 tablet 1  . FLUoxetine (PROZAC) 20 MG capsule TAKE 1 CAPSULE(20 MG) BY MOUTH THREE TIMES DAILY 90 capsule 5  . fluticasone (FLONASE) 50 MCG/ACT nasal spray SHAKE LIQUID AND USE 2 SPRAYS IN EACH NOSTRIL DAILY 16 g 1  . gabapentin (NEURONTIN) 600 MG tablet TAKE 1 TABLET(600 MG) BY MOUTH THREE TIMES DAILY 90 tablet 0  . Ginger, Zingiber officinalis, (GINGER PO) Take 1 tablet by mouth daily.    Marland Kitchen HYDROcodone-acetaminophen (NORCO) 7.5-325 MG tablet Take 1 tablet by mouth every 6 (six) hours as needed for moderate pain. 100 tablet 0  . hyoscyamine (LEVSIN SL) 0.125 MG SL tablet Take 1 tablet (0.125 mg total) by mouth every 4 (four) hours as needed. 30 tablet 0  . levocetirizine (XYZAL) 5 MG tablet TAKE 1 TABLET(5 MG) BY MOUTH EVERY EVENING  30 tablet 5  . losartan (COZAAR) 50 MG tablet TAKE 1 TABLET(50 MG) BY MOUTH DAILY 90 tablet 2  . metFORMIN (GLUCOPHAGE) 500 MG tablet TAKE 2 TABLETS BY MOUTH TWICE DAILY 360 tablet 3  . montelukast (SINGULAIR) 10 MG tablet TAKE 1 TABLET(10 MG) BY MOUTH AT BEDTIME 90 tablet 1  . omeprazole (PRILOSEC) 20 MG capsule TAKE 1 CAPSULE(20 MG) BY MOUTH TWICE DAILY BEFORE A MEAL 180 capsule 3  . ondansetron (ZOFRAN) 4 MG tablet Take 1 tablet (4 mg total) by mouth every 8 (eight) hours as needed for nausea or vomiting. 40 tablet 1  . Probiotic Product (PROBIOTIC PO) Take 1 tablet by mouth daily.    . promethazine (PHENERGAN) 25 MG tablet Take 1 tablet (25 mg total) by mouth every 8 (eight) hours as needed for nausea or vomiting. 30 tablet 1  . propranolol (INDERAL) 60 MG tablet TAKE 1 TABLET(60 MG) BY MOUTH TWICE DAILY 180 tablet 2  . rosuvastatin (CRESTOR) 5 MG tablet Take 1 tablet (5 mg total) by mouth 3 (three) times a week. 36 tablet 3  . sodium chloride (OCEAN) 0.65 % SOLN nasal spray Place 1 spray into both nostrils daily.    . SUMAtriptan (IMITREX) 50 MG tablet Take 1 tablet (50 mg total) by mouth every 2 (two) hours as needed for migraine. May repeat in 2 hours if headache persists or recurs. 10 tablet 3  . [START ON 09/16/2020] zolpidem (AMBIEN) 10 MG tablet Take 1 tablet (10 mg total) by mouth at bedtime as needed for sleep. 30 tablet 2   No current facility-administered medications for this visit.     Psychiatric Specialty Exam: Review of Systems  Musculoskeletal: Positive for back pain.  Psychiatric/Behavioral: The patient is nervous/anxious.   All other systems reviewed and are negative.   Last menstrual period 01/08/2013.There is no height or weight on file to calculate BMI.  General Appearance: NA  Eye Contact:  NA  Speech:  Clear and Coherent and Normal Rate  Volume:  Normal  Mood:  Anxious  Affect:  NA  Thought Process:  Goal Directed  Orientation:  Full (Time, Place, and Person)   Thought Content: Rumination   Suicidal Thoughts:  No  Homicidal Thoughts:  No  Memory:  Immediate;   Good Recent;   Good Remote;   Good  Judgement:  Good  Insight:  Fair  Psychomotor Activity:  NA  Concentration:  Concentration: Good  Recall:  Good  Fund of Knowledge: Good  Language: Good  Akathisia:  Negative  Handed:  Right  AIMS (if indicated): not done  Assets:  Communication Skills Desire for Improvement Financial Resources/Insurance Housing Social Support Vocational/Educational  ADL's:  Intact  Cognition: WNL  Sleep:  Fair     Assessment and Plan: 44yo married AAFwith along standinghx ofdepression (on and off) and generalized anxiety. She used to have frequent panic attacks andis prescribed alprazolam1mg  bid prn these. They are much less frequent now but they still occur occasionally. She is on buspirone and fluoxetine combination for depression/anxiety. In thepast she tried citalopram and venlafaxine but could not tolerate excessive sweating and other adverse effects which now, in perspective, she thinks might have been more related to her menopausal symptoms. 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 She has never been formally diagnosed with ADD.We added Adderall XRand gradually arrived at 60 mg daily as the dose which provides optimal benefit: improvementinfocusing ability, less fatigue. Anxiety however still is present and she continues to use alprazolam (less need for zolpidem as sleep has improved).  Dx: GAD; Panic disorder; ADD adult type; MDD recurrent, mild  Plan: Continue Prozac 60 mg daily, Buspar 15 mg bid, zolpidem 10 mg prn sleep,alprazolamto1mg bidprn panic attacksandAdderallXRto60mg  daily.Next appointment inthreemonths.The plan was discussed with  patient who had an opportunity to ask questions and these were all answered. I spend22minutes inphone consultationwith the patient.   Magdalene Patricia, MD 08/17/2020, 10:16 AM

## 2020-09-03 ENCOUNTER — Ambulatory Visit: Payer: 59 | Admitting: Family Medicine

## 2020-09-06 ENCOUNTER — Telehealth (INDEPENDENT_AMBULATORY_CARE_PROVIDER_SITE_OTHER): Payer: 59 | Admitting: Family Medicine

## 2020-09-06 ENCOUNTER — Other Ambulatory Visit: Payer: Self-pay

## 2020-09-06 DIAGNOSIS — M5416 Radiculopathy, lumbar region: Secondary | ICD-10-CM

## 2020-09-06 DIAGNOSIS — E1169 Type 2 diabetes mellitus with other specified complication: Secondary | ICD-10-CM | POA: Diagnosis not present

## 2020-09-06 DIAGNOSIS — I10 Essential (primary) hypertension: Secondary | ICD-10-CM

## 2020-09-06 DIAGNOSIS — F330 Major depressive disorder, recurrent, mild: Secondary | ICD-10-CM

## 2020-09-06 DIAGNOSIS — E669 Obesity, unspecified: Secondary | ICD-10-CM

## 2020-09-06 DIAGNOSIS — U071 COVID-19: Secondary | ICD-10-CM | POA: Diagnosis not present

## 2020-09-06 DIAGNOSIS — E782 Mixed hyperlipidemia: Secondary | ICD-10-CM

## 2020-09-06 DIAGNOSIS — U099 Post covid-19 condition, unspecified: Secondary | ICD-10-CM | POA: Insufficient documentation

## 2020-09-06 DIAGNOSIS — B958 Unspecified staphylococcus as the cause of diseases classified elsewhere: Secondary | ICD-10-CM | POA: Insufficient documentation

## 2020-09-06 MED ORDER — HYDROCODONE-ACETAMINOPHEN 7.5-325 MG PO TABS
1.0000 | ORAL_TABLET | Freq: Four times a day (QID) | ORAL | 0 refills | Status: DC | PRN
Start: 2020-09-06 — End: 2020-10-21

## 2020-09-06 MED ORDER — FLOVENT HFA 220 MCG/ACT IN AERO
1.0000 | INHALATION_SPRAY | Freq: Two times a day (BID) | RESPIRATORY_TRACT | 1 refills | Status: DC
Start: 2020-09-06 — End: 2023-05-05

## 2020-09-06 MED ORDER — MUPIROCIN CALCIUM 2 % EX CREA: TOPICAL_CREAM | CUTANEOUS | 0 refills | Status: DC

## 2020-09-06 MED ORDER — BENZONATATE 100 MG PO CAPS
100.0000 mg | ORAL_CAPSULE | Freq: Two times a day (BID) | ORAL | 1 refills | Status: DC | PRN
Start: 2020-09-06 — End: 2020-12-10

## 2020-09-06 MED ORDER — ALBUTEROL SULFATE HFA 108 (90 BASE) MCG/ACT IN AERS
2.0000 | INHALATION_SPRAY | Freq: Four times a day (QID) | RESPIRATORY_TRACT | 0 refills | Status: DC | PRN
Start: 2020-09-06 — End: 2021-01-23

## 2020-09-06 MED ORDER — SULFAMETHOXAZOLE-TRIMETHOPRIM 800-160 MG PO TABS: 1.0000 | ORAL_TABLET | Freq: Two times a day (BID) | ORAL | 0 refills | Status: DC

## 2020-09-06 NOTE — Assessment & Plan Note (Signed)
Very stressful with separation from her husband. She is encouraged to restart counseling and continue meds.

## 2020-09-06 NOTE — Assessment & Plan Note (Signed)
Patient with COVID for the second time despite being double vaccinated. She was tested on Sunday and got results on Monday. She notes pharyngitis, congestion, headache, malaise, myalgias, fevers, cough can be productive largely clear, worsening SOB, wheezing. She was seen elsewhere and given Doxycycline for a sinus infection but her symptoms did not seem to improve. She is given albuterol and tessalon perles to use prn, Flovent 220 to use bid. Omron  Get a Pulse oximeter, want oxygen in 90s Go to ER if oxygen drops to 80s and will not resolve Weekly vitals  Take Multivitamin with minerals, selenium Vitamin D 1000-2000 IU daily Probiotic with lactobacillus and bifidophilus Asprin EC 81 mg daily Fish or krill oil caps daily Melatonin 2-5 mg at bedtime

## 2020-09-06 NOTE — Progress Notes (Signed)
Virtual Visit via Video Note  I connected with Nicole LinesAishia Z Kraner on 09/06/20 at  9:20 AM EST by a video enabled telemedicine application and verified that I am speaking with the correct person using two identifiers.  Location: Patient: home, patient and provider in visit Provider: office   I discussed the limitations of evaluation and management by telemedicine and the availability of in person appointments. The patient expressed understanding and agreed to proceed. S Chism, CMA helped to get the patient set up on a video visit    Subjective:    Patient ID: Nicole Ball, female    DOB: 21-Feb-1976, 45 y.o.   MRN: [id number]  Chief Complaint  Patient presents with  . Follow-up  . Covid Positive    Pt was tested on Sunday for covid and results come back positive Monday.    HPI Patient is in today for follow up on chronic medial concerns. She is struggling with respiratory symptoms including head and chest congestion, fevers, malaise, myalgias. She was prescribed Doxycycline by UC before she was diagnosed with COVID. She is noting some lesions that are spreading acorss chest and arms. No one around her has similar lesions. The skin on her nose has been irritated and pealing.Denies CP/palp/SOB/HA/fevers/GI or GU c/o. Taking meds as prescribed. She is under a great deal of stress and separated from her husband.   Past Medical History:  Diagnosis Date  . Abnormal serum level of alkaline phosphatase 05/21/2014  . Acute low back pain 12/19/2010  . ALLERGIC RHINITIS 10/16/2010  . Anxiety and depression 06/07/2011  . Atrophic vaginitis 08/17/2016  . Cervical cancer screening 01/10/2014   Menarche at 8 Regular and moderate flow, became irregular until OCPs  history of abnormal pap in past, repeat was normal Last pap roughly 3 years ago G0P0, s/p No history of abnormal MGM, no h/o MGM  No concerns today no gyn surgeries LMP May 2013  . CTS (carpal tunnel syndrome) 01/15/2014  . Diabetes mellitus  type 2 in obese (HCC) 10/30/2011  . Dry eyes 08/17/2016  . Early menopause   . Essential hypertension   . HIATAL HERNIA WITH REFLUX, HX OF 10/16/2010  . Hyperlipidemia 04/21/2011  . Inappropriate sinus tachycardia   . INSOMNIA 10/16/2010  . Knee pain, left 03/11/2012  . Migraine 03/07/2013  . Morbid obesity (HCC)   . Palpitations 01/28/2015  . Reflux 04/22/2012  . Sleep apnea 06/07/2011  . Tobacco use disorder 02/10/2017  . Urinary urgency 08/17/2016  . UTI'S, HX OF 10/16/2010    Past Surgical History:  Procedure Laterality Date  . CHOLECYSTECTOMY      Family History  Problem Relation Age of Onset  . Lupus Mother   . Arthritis Maternal Grandmother   . Scleroderma Maternal Grandmother   . Glaucoma Maternal Grandmother   . Cataracts Maternal Grandmother   . Cancer Maternal Grandfather        prostate  . Coronary artery disease Maternal Grandfather        s/p angioplasty  . Diabetes Maternal Grandfather        Type 2  . Cancer Paternal Grandmother        Breast ca- dx in 2050's  . Thyroid disease Paternal Grandmother   . Breast cancer Paternal Grandmother   . Diabetes Paternal Grandfather   . Thyroid disease Sister   . Asthma Sister   . Allergies Brother   . Cancer Father        cigarette, alcohol HEENT squamous cell  .  Alcohol abuse Father   . Arthritis Other   . Alcohol abuse Maternal Aunt   . Alcohol abuse Maternal Uncle     Social History   Socioeconomic History  . Marital status: Married    Spouse name: Not on file  . Number of children: Not on file  . Years of education: Not on file  . Highest education level: Not on file  Occupational History  . Not on file  Tobacco Use  . Smoking status: Current Some Day Smoker    Packs/day: 0.10    Types: Cigars  . Smokeless tobacco: Never Used  . Tobacco comment: 1 black and milds  Vaping Use  . Vaping Use: Never used  Substance and Sexual Activity  . Alcohol use: Yes    Comment: twice/year  . Drug use: No  . Sexual  activity: Not on file  Other Topics Concern  . Not on file  Social History Narrative   Married lives w/ husband, works at [employer]   Social Determinants of Corporate investment banker Strain: Not on [employer] Insecurity: Not on file  Transportation Needs: Not on file  Physical Activity: Not on file  Stress: Not on file  Social Connections: Not on file  Intimate Partner Violence: Not on file    Outpatient Medications Prior to Visit  Medication Sig Dispense Refill  . ACETAMINOPHEN-BUTALBITAL 50-325 MG TABS Take 1 tablet by mouth 2 (two) times daily as needed (HEADACHES). 60 tablet 0  . ALPRAZolam (XANAX) 1 MG tablet Take 1 tablet (1 mg total) by mouth 2 (two) times daily as needed for anxiety. 60 tablet 2  . amphetamine-dextroamphetamine (ADDERALL XR) 30 MG 24 hr capsule Take 2 capsules (60 mg total) by mouth every morning. 60 capsule 0  . [START ON 09/16/2020] amphetamine-dextroamphetamine (ADDERALL XR) 30 MG 24 hr capsule Take 2 capsules (60 mg total) by mouth every morning. 60 capsule 0  . [START ON 10/16/2020] amphetamine-dextroamphetamine (ADDERALL XR) 30 MG 24 hr capsule Take 2 capsules (60 mg total) by mouth every morning. 60 capsule 0  . baclofen (LIORESAL) 10 MG tablet Take 1 tablet (10 mg total) by mouth 2 (two) times daily as needed for muscle spasms. 30 each 2  . BLACK COHOSH PO Take 1 tablet by mouth daily.    Melene Muller ON 09/16/2020] busPIRone (BUSPAR) 15 MG tablet Take 1 tablet (15 mg total) by mouth 2 (two) times daily. 60 tablet 2  . celecoxib (CELEBREX) 200 MG capsule One to 2 tablets by mouth daily as needed for pain. 60 capsule 2  . conjugated estrogens (PREMARIN) vaginal cream Apply small amount to affected area twice a week 42.5 g 1  . cyclobenzaprine (FLEXERIL) 10 MG tablet Take 1 tablet (10 mg total) by mouth 2 (two) times daily as needed for muscle spasms. 60 tablet 2  . fluconazole (DIFLUCAN) 150 MG tablet Take 1 tablet (150 mg total) by mouth once a week. 4  tablet 1  . FLUoxetine (PROZAC) 20 MG capsule TAKE 1 CAPSULE(20 MG) BY MOUTH THREE TIMES DAILY 90 capsule 5  . fluticasone (FLONASE) 50 MCG/ACT nasal spray SHAKE LIQUID AND USE 2 SPRAYS IN EACH NOSTRIL DAILY 16 g 1  . gabapentin (NEURONTIN) 600 MG tablet TAKE 1 TABLET(600 MG) BY MOUTH THREE TIMES DAILY 90 tablet 0  . Ginger, Zingiber officinalis, (GINGER PO) Take 1 tablet by mouth daily.    . hyoscyamine (LEVSIN SL) 0.125 MG SL tablet Take 1 tablet (0.125 mg total)  by mouth every 4 (four) hours as needed. 30 tablet 0  . levocetirizine (XYZAL) 5 MG tablet TAKE 1 TABLET(5 MG) BY MOUTH EVERY EVENING 30 tablet 5  . losartan (COZAAR) 50 MG tablet TAKE 1 TABLET(50 MG) BY MOUTH DAILY 90 tablet 2  . metFORMIN (GLUCOPHAGE) 500 MG tablet TAKE 2 TABLETS BY MOUTH TWICE DAILY 360 tablet 3  . montelukast (SINGULAIR) 10 MG tablet TAKE 1 TABLET(10 MG) BY MOUTH AT BEDTIME 90 tablet 1  . omeprazole (PRILOSEC) 20 MG capsule TAKE 1 CAPSULE(20 MG) BY MOUTH TWICE DAILY BEFORE A MEAL 180 capsule 3  . ondansetron (ZOFRAN) 4 MG tablet Take 1 tablet (4 mg total) by mouth every 8 (eight) hours as needed for nausea or vomiting. 40 tablet 1  . Probiotic Product (PROBIOTIC PO) Take 1 tablet by mouth daily.    . promethazine (PHENERGAN) 25 MG tablet Take 1 tablet (25 mg total) by mouth every 8 (eight) hours as needed for nausea or vomiting. 30 tablet 1  . propranolol (INDERAL) 60 MG tablet TAKE 1 TABLET(60 MG) BY MOUTH TWICE DAILY 180 tablet 2  . rosuvastatin (CRESTOR) 5 MG tablet Take 1 tablet (5 mg total) by mouth 3 (three) times a week. 36 tablet 3  . sodium chloride (OCEAN) 0.65 % SOLN nasal spray Place 1 spray into both nostrils daily.    . SUMAtriptan (IMITREX) 50 MG tablet Take 1 tablet (50 mg total) by mouth every 2 (two) hours as needed for migraine. May repeat in 2 hours if headache persists or recurs. 10 tablet 3  . [START ON 09/16/2020] zolpidem (AMBIEN) 10 MG tablet Take 1 tablet (10 mg total) by mouth at bedtime as  needed for sleep. 30 tablet 2  . doxycycline (VIBRA-TABS) 100 MG tablet Take 1 tablet (100 mg total) by mouth 2 (two) times daily. 20 tablet 0  . HYDROcodone-acetaminophen (NORCO) 7.5-325 MG tablet Take 1 tablet by mouth every 6 (six) hours as needed for moderate pain. 100 tablet 0   No facility-administered medications prior to visit.    No Known Allergies  Review of Systems  Constitutional: Positive for malaise/fatigue. Negative for fever.  HENT: Positive for congestion.   Eyes: Negative for blurred vision.  Respiratory: Positive for cough. Negative for shortness of breath.   Cardiovascular: Negative for chest pain, palpitations and leg swelling.  Gastrointestinal: Negative for abdominal pain, blood in stool and nausea.  Genitourinary: Negative for dysuria and frequency.  Musculoskeletal: Positive for myalgias. Negative for falls.  Skin: Positive for rash.  Neurological: Positive for headaches. Negative for dizziness and loss of consciousness.  Endo/Heme/Allergies: Negative for environmental allergies.  Psychiatric/Behavioral: Negative for depression. The patient is not nervous/anxious.        Objective:    Physical Exam Constitutional:      Appearance: Normal appearance. She is not ill-appearing.  HENT:     Head: Normocephalic and atraumatic.     Right Ear: External ear normal.     Left Ear: External ear normal.     Nose: Nose normal.  Eyes:     General:        Right eye: No discharge.        Left eye: No discharge.  Pulmonary:     Effort: Pulmonary effort is normal.  Skin:    Findings: Lesion present.     Comments: Scattered pustular lesions  Neurological:     Mental Status: She is alert and oriented to person, place, and time.  Psychiatric:  Behavior: Behavior normal.     LMP 01/08/2013  Wt Readings from Last 3 Encounters:  02/08/20 197 lb (89.4 kg)  12/28/19 197 lb (89.4 kg)  12/26/19 195 lb (88.5 kg)    Diabetic Foot Exam - Simple   No data  filed    Lab Results  Component Value Date   WBC 6.2 11/12/2019   HGB 13.0 11/12/2019   HCT 41.1 11/12/2019   PLT 249 11/12/2019   GLUCOSE 116 (H) 11/12/2019   CHOL 174 09/19/2019   TRIG 126 09/19/2019   HDL 65 09/19/2019   LDLCALC 87 09/19/2019   ALT 28 11/12/2019   AST 33 11/12/2019   NA 141 11/12/2019   K 4.5 11/12/2019   CL 105 11/12/2019   CREATININE 0.92 11/12/2019   BUN 13 11/12/2019   CO2 24 11/12/2019   TSH 1.590 09/19/2019   HGBA1C 6.9 (H) 10/14/2018   MICROALBUR <0.7 09/17/2015    Lab Results  Component Value Date   TSH 1.590 09/19/2019   Lab Results  Component Value Date   WBC 6.2 11/12/2019   HGB 13.0 11/12/2019   HCT 41.1 11/12/2019   MCV 88.6 11/12/2019   PLT 249 11/12/2019   Lab Results  Component Value Date   NA 141 11/12/2019   K 4.5 11/12/2019   CO2 24 11/12/2019   GLUCOSE 116 (H) 11/12/2019   BUN 13 11/12/2019   CREATININE 0.92 11/12/2019   BILITOT 1.1 11/12/2019   ALKPHOS 157 (H) 11/12/2019   AST 33 11/12/2019   ALT 28 11/12/2019   PROT 7.9 11/12/2019   ALBUMIN 3.6 11/12/2019   CALCIUM 9.9 11/12/2019   ANIONGAP 12 11/12/2019   GFR 121.09 01/14/2018   Lab Results  Component Value Date   CHOL 174 09/19/2019   Lab Results  Component Value Date   HDL 65 09/19/2019   Lab Results  Component Value Date   LDLCALC 87 09/19/2019   Lab Results  Component Value Date   TRIG 126 09/19/2019   Lab Results  Component Value Date   CHOLHDL 2.7 09/19/2019   Lab Results  Component Value Date   HGBA1C 6.9 (H) 10/14/2018       Assessment & Plan:   Problem List Items Addressed This Visit    Hyperlipidemia    Encouraged heart healthy diet, increase exercise, avoid trans fats, consider a krill oil cap daily. Tolerating Rosuvastatin      Relevant Orders   Lipid panel   Diabetes mellitus type 2 in obese (HCC) - Primary    hgba1c acceptable, minimize simple carbs. Increase exercise as tolerated. Continue current meds       Relevant Orders   Hemoglobin A1c   Hypertension    Well controlled, no changes to meds. Encouraged heart healthy diet such as the DASH diet and exercise as tolerated.       Relevant Orders   CBC   Comprehensive metabolic panel   TSH   Lumbar radiculopathy    Had a recent epidural and it helped for a short time but her pain is worsening again. The gabapentin helps some. And has increased her dosing back up to tid. Refill of Hydrocodone given today.       Major depressive disorder, recurrent, mild (HCC)    Very stressful with separation from her husband. She is encouraged to restart counseling and continue meds.       COVID    Patient with COVID for the second time despite being double vaccinated. She was  tested on Sunday and got results on Monday. She notes pharyngitis, congestion, headache, malaise, myalgias, fevers, cough can be productive largely clear, worsening SOB, wheezing. She was seen elsewhere and given Doxycycline for a sinus infection but her symptoms did not seem to improve. She is given albuterol and tessalon perles to use prn, Flovent 220 to use bid. Omron  Get a Pulse oximeter, want oxygen in 90s Go to ER if oxygen drops to 80s and will not resolve Weekly vitals  Take Multivitamin with minerals, selenium Vitamin D 1000-2000 IU daily Probiotic with lactobacillus and bifidophilus Asprin EC 81 mg daily Fish or krill oil caps daily Melatonin 2-5 mg at bedtime        Relevant Medications   sulfamethoxazole-trimethoprim (BACTRIM DS) 800-160 MG tablet   mupirocin cream (BACTROBAN) 2 %   Staph infection    On skin and in nose. Suspect staph. Started on Mupirocin  And bactrim.  If no response will refer to derm      Relevant Medications   sulfamethoxazole-trimethoprim (BACTRIM DS) 800-160 MG tablet   mupirocin cream (BACTROBAN) 2 %      I have discontinued Jayani Z. Hartland's doxycycline. I am also having her start on albuterol, Flovent HFA, benzonatate,  sulfamethoxazole-trimethoprim, and mupirocin cream. Additionally, I am having her maintain her sodium chloride, hyoscyamine, Premarin, BLACK COHOSH PO, (Ginger, Zingiber officinalis, (GINGER PO)), Probiotic Product (PROBIOTIC PO), ACETAMINOPHEN-BUTALBITAL, celecoxib, baclofen, SUMAtriptan, montelukast, rosuvastatin, cyclobenzaprine, FLUoxetine, levocetirizine, ALPRAZolam, losartan, promethazine, gabapentin, ondansetron, metFORMIN, propranolol, omeprazole, fluticasone, fluconazole, busPIRone, zolpidem, amphetamine-dextroamphetamine, amphetamine-dextroamphetamine, amphetamine-dextroamphetamine, and HYDROcodone-acetaminophen.  Meds ordered this encounter  Medications  . albuterol (VENTOLIN HFA) 108 (90 Base) MCG/ACT inhaler    Sig: Inhale 2 puffs into the lungs every 6 (six) hours as needed for wheezing or shortness of breath.    Dispense:  8 g    Refill:  0  . fluticasone (FLOVENT HFA) 220 MCG/ACT inhaler    Sig: Inhale 1 puff into the lungs in the morning and at bedtime.    Dispense:  1 each    Refill:  1  . benzonatate (TESSALON) 100 MG capsule    Sig: Take 1 capsule (100 mg total) by mouth 2 (two) times daily as needed for cough.    Dispense:  40 capsule    Refill:  1  . sulfamethoxazole-trimethoprim (BACTRIM DS) 800-160 MG tablet    Sig: Take 1 tablet by mouth 2 (two) times daily.    Dispense:  20 tablet    Refill:  0  . mupirocin cream (BACTROBAN) 2 %    Sig: Apply via clean qtip to nares qhs    Dispense:  15 g    Refill:  0  . HYDROcodone-acetaminophen (NORCO) 7.5-325 MG tablet    Sig: Take 1 tablet by mouth every 6 (six) hours as needed for moderate pain.    Dispense:  100 tablet    Refill:  0   I discussed the assessment and treatment plan with the patient. The patient was provided an opportunity to ask questions and all were answered. The patient agreed with the plan and demonstrated an understanding of the instructions.   The patient was advised to call back or seek an  in-person evaluation if the symptoms worsen or if the condition fails to improve as anticipated.  I provided 25 minutes of non-face-to-face time during this encounter.   Danise Edge, MD

## 2020-09-06 NOTE — Assessment & Plan Note (Signed)
Had a recent epidural and it helped for a short time but her pain is worsening again. The gabapentin helps some. And has increased her dosing back up to tid. Refill of Hydrocodone given today.

## 2020-09-06 NOTE — Assessment & Plan Note (Signed)
hgba1c acceptable, minimize simple carbs. Increase exercise as tolerated. Continue current meds 

## 2020-09-06 NOTE — Assessment & Plan Note (Signed)
Well controlled, no changes to meds. Encouraged heart healthy diet such as the DASH diet and exercise as tolerated.  °

## 2020-09-06 NOTE — Assessment & Plan Note (Signed)
On skin and in nose. Suspect staph. Started on Mupirocin  And bactrim.  If no response will refer to derm

## 2020-09-06 NOTE — Assessment & Plan Note (Signed)
Encouraged heart healthy diet, increase exercise, avoid trans fats, consider a krill oil cap daily. Tolerating Rosuvastatin 

## 2020-09-12 ENCOUNTER — Other Ambulatory Visit (HOSPITAL_COMMUNITY): Payer: Self-pay | Admitting: Psychiatry

## 2020-09-12 ENCOUNTER — Other Ambulatory Visit: Payer: Self-pay | Admitting: Medical

## 2020-09-13 ENCOUNTER — Other Ambulatory Visit: Payer: Self-pay | Admitting: Family Medicine

## 2020-09-13 ENCOUNTER — Encounter (HOSPITAL_BASED_OUTPATIENT_CLINIC_OR_DEPARTMENT_OTHER): Payer: Self-pay | Admitting: *Deleted

## 2020-09-13 ENCOUNTER — Other Ambulatory Visit: Payer: Self-pay

## 2020-09-13 ENCOUNTER — Emergency Department (HOSPITAL_BASED_OUTPATIENT_CLINIC_OR_DEPARTMENT_OTHER)
Admission: EM | Admit: 2020-09-13 | Discharge: 2020-09-13 | Disposition: A | Discharge status: Home / Self Care | Payer: 59 | Attending: Emergency Medicine | Admitting: Emergency Medicine

## 2020-09-13 ENCOUNTER — Other Ambulatory Visit (INDEPENDENT_AMBULATORY_CARE_PROVIDER_SITE_OTHER): Payer: 59

## 2020-09-13 DIAGNOSIS — I10 Essential (primary) hypertension: Secondary | ICD-10-CM | POA: Diagnosis not present

## 2020-09-13 DIAGNOSIS — E669 Obesity, unspecified: Secondary | ICD-10-CM | POA: Diagnosis not present

## 2020-09-13 DIAGNOSIS — E1169 Type 2 diabetes mellitus with other specified complication: Secondary | ICD-10-CM | POA: Insufficient documentation

## 2020-09-13 DIAGNOSIS — E782 Mixed hyperlipidemia: Secondary | ICD-10-CM

## 2020-09-13 DIAGNOSIS — R21 Rash and other nonspecific skin eruption: Secondary | ICD-10-CM | POA: Diagnosis present

## 2020-09-13 DIAGNOSIS — Z8616 Personal history of COVID-19: Secondary | ICD-10-CM | POA: Diagnosis not present

## 2020-09-13 DIAGNOSIS — Z79899 Other long term (current) drug therapy: Secondary | ICD-10-CM | POA: Insufficient documentation

## 2020-09-13 DIAGNOSIS — Z7984 Long term (current) use of oral hypoglycemic drugs: Secondary | ICD-10-CM | POA: Diagnosis not present

## 2020-09-13 DIAGNOSIS — L819 Disorder of pigmentation, unspecified: Secondary | ICD-10-CM | POA: Diagnosis not present

## 2020-09-13 DIAGNOSIS — F17290 Nicotine dependence, other tobacco product, uncomplicated: Secondary | ICD-10-CM | POA: Diagnosis not present

## 2020-09-13 LAB — CBC WITH DIFFERENTIAL/PLATELET
Abs Immature Granulocytes: 0.01 10*3/uL (ref 0.00–0.07)
Basophils Absolute: 0.1 10*3/uL (ref 0.0–0.1)
Basophils Relative: 1 %
Eosinophils Absolute: 0.2 10*3/uL (ref 0.0–0.5)
Eosinophils Relative: 4 %
HCT: 40.2 % (ref 36.0–46.0)
Hemoglobin: 13.2 g/dL (ref 12.0–15.0)
Immature Granulocytes: 0 %
Lymphocytes Relative: 30 %
Lymphs Abs: 1.5 10*3/uL (ref 0.7–4.0)
MCH: 28.6 pg (ref 26.0–34.0)
MCHC: 32.8 g/dL (ref 30.0–36.0)
MCV: 87.2 fL (ref 80.0–100.0)
Monocytes Absolute: 0.3 10*3/uL (ref 0.1–1.0)
Monocytes Relative: 6 %
Neutro Abs: 3 10*3/uL (ref 1.7–7.7)
Neutrophils Relative %: 59 %
Platelets: 208 10*3/uL (ref 150–400)
RBC: 4.61 MIL/uL (ref 3.87–5.11)
RDW: 15 % (ref 11.5–15.5)
WBC: 5 10*3/uL (ref 4.0–10.5)
nRBC: 0 % (ref 0.0–0.2)

## 2020-09-13 LAB — CBC
HCT: 40.4 % (ref 36.0–46.0)
Hemoglobin: 13 g/dL (ref 12.0–15.0)
MCHC: 32.1 g/dL (ref 30.0–36.0)
MCV: 87.9 fl (ref 78.0–100.0)
Platelets: 180 10*3/uL (ref 150.0–400.0)
RBC: 4.6 Mil/uL (ref 3.87–5.11)
RDW: 15.4 % (ref 11.5–15.5)
WBC: 4.9 10*3/uL (ref 4.0–10.5)

## 2020-09-13 LAB — LIPID PANEL
Cholesterol: 182 mg/dL (ref 0–200)
HDL: 63.9 mg/dL (ref >39.00)
LDL Cholesterol: 86 mg/dL (ref 0–99)
NonHDL: 118.16
Total CHOL/HDL Ratio: 3
Triglycerides: 160 mg/dL — ABNORMAL HIGH (ref 0.0–149.0)
VLDL: 32 mg/dL (ref 0.0–40.0)

## 2020-09-13 LAB — COMPREHENSIVE METABOLIC PANEL
ALT: 33 U/L (ref 0–35)
ALT: 34 U/L (ref 0–44)
AST: 20 U/L (ref 0–37)
AST: 24 U/L (ref 15–41)
Albumin: 4.3 g/dL (ref 3.5–5.2)
Albumin: 4.2 g/dL (ref 3.5–5.0)
Alkaline Phosphatase: 144 U/L — ABNORMAL HIGH (ref 39–117)
Alkaline Phosphatase: 140 U/L — ABNORMAL HIGH (ref 38–126)
Anion gap: 10 (ref 5–15)
BUN: 17 mg/dL (ref 6–23)
BUN: 16 mg/dL (ref 6–20)
CO2: 21 mEq/L (ref 19–32)
CO2: 20 mmol/L — ABNORMAL LOW (ref 22–32)
Calcium: 10 mg/dL (ref 8.4–10.5)
Calcium: 9.7 mg/dL (ref 8.9–10.3)
Chloride: 103 mEq/L (ref 96–112)
Chloride: 103 mmol/L (ref 98–111)
Creatinine, Ser: 0.91 mg/dL (ref 0.40–1.20)
Creatinine, Ser: 0.77 mg/dL (ref 0.44–1.00)
GFR, Estimated: >60 mL/min (ref >60)
GFR: 75.77 mL/min (ref >60.00)
Glucose, Bld: 233 mg/dL — ABNORMAL HIGH (ref 70–99)
Glucose, Bld: 195 mg/dL — ABNORMAL HIGH (ref 70–99)
Potassium: 3.7 mEq/L (ref 3.5–5.1)
Potassium: 4 mmol/L (ref 3.5–5.1)
Sodium: 135 mEq/L (ref 135–145)
Sodium: 133 mmol/L — ABNORMAL LOW (ref 135–145)
Total Bilirubin: 0.2 mg/dL (ref 0.2–1.2)
Total Bilirubin: 0.3 mg/dL (ref 0.3–1.2)
Total Protein: 7.9 g/dL (ref 6.0–8.3)
Total Protein: 8.2 g/dL — ABNORMAL HIGH (ref 6.5–8.1)

## 2020-09-13 LAB — HEMOGLOBIN A1C: Hgb A1c MFr Bld: 10 % — ABNORMAL HIGH (ref 4.6–6.5)

## 2020-09-13 LAB — TSH: TSH: 1.81 u[IU]/mL (ref 0.35–4.50)

## 2020-09-13 MED ORDER — GLIMEPIRIDE 2 MG PO TABS: 2.0000 mg | ORAL_TABLET | Freq: Two times a day (BID) | ORAL | 3 refills | Status: DC

## 2020-09-13 NOTE — Discharge Instructions (Signed)
Your laboratory results are within normal limits today, we discussed this at length.  The blood cultures that were obtained on today's visit, will not result for the next 2 to 3 days.  If there is any growth on these, you will be called with results.

## 2020-09-13 NOTE — ED Provider Notes (Addendum)
[facility] EMERGENCY DEPARTMENT Provider Note   CSN: [id number] Arrival date & time: 09/13/20  1636     History Chief Complaint  Patient presents with  . Rash    Nicole Ball is a 45 y.o. female.  45 y.o female with a PMH of DM, HTN, recent staph skin infection presents to the ED with a chief complaint of lesions. Patient was seen via telehealth by PCP Dr. Rogelia RohrerBlythe who prescribed her mupirocin and bactrim to treat her for MRSA about a week ago. She reports lesions have now spreads to both of her arms. She states "I had picked at some of those, not the other tho, look at my skin if I pull it turns color". She has been applying neosporin and mupirocin without much improvement. Patient reports she is afraid that infection might have spread into her blood stream and she would like workup for bacteremia. She does not have any systemic signs such as fever, nausea, vomiting, diarrhea or other complaints.   The history is provided by the patient, medical records and the spouse.       Past Medical History:  Diagnosis Date  . Abnormal serum level of alkaline phosphatase 05/21/2014  . Acute low back pain 12/19/2010  . ALLERGIC RHINITIS 10/16/2010  . Anxiety and depression 06/07/2011  . Atrophic vaginitis 08/17/2016  . Cervical cancer screening 01/10/2014   Menarche at 8 Regular and moderate flow, became irregular until OCPs  history of abnormal pap in past, repeat was normal Last pap roughly 3 years ago G0P0, s/p No history of abnormal MGM, no h/o MGM  No concerns today no gyn surgeries LMP May 2013  . CTS (carpal tunnel syndrome) 01/15/2014  . Diabetes mellitus type 2 in obese (HCC) 10/30/2011  . Dry eyes 08/17/2016  . Early menopause   . Essential hypertension   . HIATAL HERNIA WITH REFLUX, HX OF 10/16/2010  . Hyperlipidemia 04/21/2011  . Inappropriate sinus tachycardia   . INSOMNIA 10/16/2010  . Knee pain, left 03/11/2012  . Migraine 03/07/2013  . Morbid obesity (HCC)   . Palpitations  01/28/2015  . Reflux 04/22/2012  . Sleep apnea 06/07/2011  . Tobacco use disorder 02/10/2017  . Urinary urgency 08/17/2016  . UTI'S, HX OF 10/16/2010    Patient Active Problem List   Diagnosis Date Noted  . COVID 09/06/2020  . Staph infection 09/06/2020  . Back pain 12/28/2019  . URI (upper respiratory infection) 08/24/2019  . Panic disorder without agoraphobia 02/25/2019  . 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 Adult ADHD 02/25/2019  . Abdominal pain 01/09/2019  . Suspected COVID-19 virus infection 12/12/2018  . Lumbar radiculopathy 11/23/2018  . Stye 05/09/2018  . Vaginitis 05/04/2018  . Obesity (BMI 30-39.9) 01/12/2018  . Headache 10/13/2017  . Diarrhea 10/13/2017  . Myalgia 07/21/2017  . Dry eyes 08/17/2016  . Atrophic vaginitis 08/17/2016  . Urinary urgency 08/17/2016  . Heart palpitations 05/21/2015  . Hypertension 03/30/2015  . Chest pain 03/30/2015  . Palpitations 01/28/2015  . Abnormal serum level of alkaline phosphatase 05/21/2014  . CTS (carpal tunnel syndrome) 01/15/2014  . Preventative health care 01/15/2014  . Cervical cancer screening 01/10/2014  . Migraine 03/07/2013  . Acute sinusitis 08/06/2012  . Knee pain, left 03/11/2012  . Diabetes mellitus type 2 in obese (HCC) 10/30/2011  . Amenorrhea 07/11/2011  . OSA (obstructive sleep apnea) 06/07/2011  . GAD (generalized anxiety disorder) 06/07/2011  . Hyperlipidemia 04/21/2011  . Allergic state 10/16/2010  . Insomnia  10/16/2010  . SNORING 10/16/2010  . Hiatal hernia with gastroesophageal reflux 10/16/2010  . UTI'S, HX OF 10/16/2010    Past Surgical History:  Procedure Laterality Date  . CHOLECYSTECTOMY       OB History   No obstetric history on file.     Family History  Problem Relation Age of Onset  . Lupus Mother   . Arthritis Maternal Grandmother   . Scleroderma Maternal Grandmother   . Glaucoma Maternal Grandmother   . Cataracts Maternal Grandmother   . Cancer  Maternal Grandfather        prostate  . Coronary artery disease Maternal Grandfather        s/p angioplasty  . Diabetes Maternal Grandfather        Type 2  . Cancer Paternal Grandmother        Breast ca- dx in 35's  . Thyroid disease Paternal Grandmother   . Breast cancer Paternal Grandmother   . Diabetes Paternal Grandfather   . Thyroid disease Sister   . Asthma Sister   . Allergies Brother   . Cancer Father        cigarette, alcohol HEENT squamous cell  . Alcohol abuse Father   . Arthritis Other   . Alcohol abuse Maternal Aunt   . Alcohol abuse Maternal Uncle     Social History   Tobacco Use  . Smoking status: Current Some Day Smoker    Packs/day: 0.10    Types: Cigars  . Smokeless tobacco: Never Used  . Tobacco comment: 1 black and milds  Vaping Use  . Vaping Use: Never used  Substance Use Topics  . Alcohol use: Yes    Comment: twice/year  . Drug use: No    Home Medications Prior to Admission medications   Medication Sig Start Date End Date Taking? Authorizing Provider  ACETAMINOPHEN-BUTALBITAL 50-325 MG TABS Take 1 tablet by mouth 2 (two) times daily as needed (HEADACHES). 10/04/19   [name], MD  albuterol (VENTOLIN HFA) 108 (90 Base) MCG/ACT inhaler Inhale 2 puffs into the lungs every 6 (six) hours as needed for wheezing or shortness of breath. 09/06/20   [name], MD  ALPRAZolam (XANAX) 1 MG tablet TAKE 1 TABLET(1 MG) BY MOUTH TWICE DAILY AS NEEDED FOR ANXIETY 09/13/20   Pucilowski, Olgierd A, MD  amphetamine-dextroamphetamine (ADDERALL XR) 30 MG 24 hr capsule Take 2 capsules (60 mg total) by mouth every morning. 08/17/20 09/16/20  Pucilowski, Roosvelt Maser, MD  amphetamine-dextroamphetamine (ADDERALL XR) 30 MG 24 hr capsule Take 2 capsules (60 mg total) by mouth every morning. 09/16/20 10/16/20  Pucilowski, Roosvelt Maser, MD  amphetamine-dextroamphetamine (ADDERALL XR) 30 MG 24 hr capsule Take 2 capsules (60 mg total) by mouth every morning. 10/16/20 11/15/20  Pucilowski,  Roosvelt Maser, MD  baclofen (LIORESAL) 10 MG tablet Take 1 tablet (10 mg total) by mouth 2 (two) times daily as needed for muscle spasms. 12/26/19   Myra Rude, MD  benzonatate (TESSALON) 100 MG capsule Take 1 capsule (100 mg total) by mouth 2 (two) times daily as needed for cough. 09/06/20   [name], MD  BLACK COHOSH PO Take 1 tablet by mouth daily.    [provider]  busPIRone (BUSPAR) 15 MG tablet Take 1 tablet (15 mg total) by mouth 2 (two) times daily. 09/16/20 12/15/20  Pucilowski, Roosvelt Maser, MD  celecoxib (CELEBREX) 200 MG capsule One to 2 tablets by mouth daily as needed for pain. 12/26/19   Myra Rude,  MD  conjugated estrogens (PREMARIN) vaginal cream Apply small amount to affected area twice a week 08/23/19   [name], MD  cyclobenzaprine (FLEXERIL) 10 MG tablet Take 1 tablet (10 mg total) by mouth 2 (two) times daily as needed for muscle spasms. 02/25/20 02/24/21  [name], MD  fluconazole (DIFLUCAN) 150 MG tablet Take 1 tablet (150 mg total) by mouth once a week. 08/08/20   [name], MD  FLUoxetine (PROZAC) 20 MG capsule TAKE 1 CAPSULE(20 MG) BY MOUTH THREE TIMES DAILY 03/19/20   Pucilowski, Roosvelt Maser, MD  fluticasone (FLONASE) 50 MCG/ACT nasal spray SHAKE LIQUID AND USE 2 SPRAYS IN EACH NOSTRIL DAILY 07/30/20   Saguier, Ramon Dredge, PA-C  fluticasone (FLOVENT HFA) 220 MCG/ACT inhaler Inhale 1 puff into the lungs in the morning and at bedtime. 09/06/20   [name], MD  gabapentin (NEURONTIN) 600 MG tablet TAKE 1 TABLET(600 MG) BY MOUTH THREE TIMES DAILY 09/12/20   Saguier, Ramon Dredge, PA-C  Ginger, Zingiber officinalis, (GINGER PO) Take 1 tablet by mouth daily.    [provider]  glimepiride (AMARYL) 2 MG tablet Take 1 tablet (2 mg total) by mouth in the morning and at bedtime. 09/13/20   [name], MD  HYDROcodone-acetaminophen (NORCO) 7.5-325 MG tablet Take 1 tablet by mouth every 6 (six) hours as needed for moderate pain. 09/06/20   [name], MD  hyoscyamine (LEVSIN SL) 0.125 MG SL tablet Take 1 tablet (0.125 mg total) by mouth every 4 (four) hours as needed. 06/03/19   [name], MD  levocetirizine (XYZAL) 5 MG tablet TAKE 1 TABLET(5 MG) BY MOUTH EVERY EVENING 04/24/20   [name], MD  losartan (COZAAR) 50 MG tablet TAKE 1 TABLET(50 MG) BY MOUTH DAILY 05/24/20   Lars Masson, MD  metFORMIN (GLUCOPHAGE) 500 MG tablet TAKE 2 TABLETS BY MOUTH TWICE DAILY 07/07/20   [name], MD  montelukast (SINGULAIR) 10 MG tablet TAKE 1 TABLET(10 MG) BY MOUTH AT BEDTIME 01/24/20   [name], MD  mupirocin cream (BACTROBAN) 2 % Apply via clean qtip to nares qhs 09/06/20   [name], MD  omeprazole (PRILOSEC) 20 MG capsule TAKE 1 CAPSULE(20 MG) BY MOUTH TWICE DAILY BEFORE A MEAL 07/07/20   [name], MD  ondansetron (ZOFRAN) 4 MG tablet Take 1 tablet (4 mg total) by mouth every 8 (eight) hours as needed for nausea or vomiting. 07/03/20   [name], MD  Probiotic Product (PROBIOTIC PO) Take 1 tablet by mouth daily.    [provider]  promethazine (PHENERGAN) 25 MG tablet Take 1 tablet (25 mg total) by mouth every 8 (eight) hours as needed for nausea or vomiting. 06/04/20   [name], MD  propranolol (INDERAL) 60 MG tablet TAKE 1 TABLET(60 MG) BY MOUTH TWICE DAILY 07/09/20   Lars Masson, MD  rosuvastatin (CRESTOR) 5 MG tablet Take 1 tablet (5 mg total) by mouth 3 (three) times a week. 02/24/20   Quintella Reichert, MD  sodium chloride (OCEAN) 0.65 % SOLN nasal spray Place 1 spray into both nostrils daily.    [provider]  sulfamethoxazole-trimethoprim (BACTRIM DS) 800-160 MG tablet Take 1 tablet by mouth 2 (two) times daily. 09/06/20   [name], MD  SUMAtriptan (IMITREX) 50 MG tablet Take 1 tablet (50 mg total) by mouth every 2 (two) hours as needed for migraine. May repeat in 2 hours if headache persists or recurs.  12/28/19   [name], MD  zolpidem (AMBIEN) 10  MG tablet Take 1 tablet (10 mg total) by mouth at bedtime as needed for sleep. 09/16/20 12/15/20  Pucilowski, Roosvelt Maser, MD    Allergies    Patient has no known allergies.  Review of Systems   Review of Systems  Constitutional: Negative for fever.  Respiratory: Negative for shortness of breath.   Cardiovascular: Negative for chest pain.  Gastrointestinal: Negative for abdominal pain, nausea and vomiting.  Skin: Positive for rash and wound. Negative for color change.  All other systems reviewed and are negative.   Physical Exam Updated Vital Signs BP (!) 146/107   Pulse (!) 114   Temp 98.3 F (36.8 C) (Oral)   Resp 16   Ht 5\' 3"  (1.6 m)   Wt 89.4 kg   LMP 01/08/2013   SpO2 98%   BMI 34.91 kg/m   Physical Exam Vitals and nursing note reviewed.  Constitutional:      Appearance: Normal appearance. She is not ill-appearing. Toxic:   HENT:     Head: Normocephalic and atraumatic.     Nose: Nose normal.  Eyes:     Pupils: Pupils are equal, round, and reactive to light.  Pulmonary:     Effort: Pulmonary effort is normal.  Abdominal:     General: Abdomen is flat.  Musculoskeletal:     Cervical back: Normal range of motion and neck supple.  Skin:    Findings: Lesion present.     Comments: Several discolored lesions to the upper and lower extremities, noted on face as well as nose region.   Neurological:     Mental Status: She is alert and oriented to person, place, and time.     ED Results / Procedures / Treatments   Labs (all labs ordered are listed, but only abnormal results are displayed) Labs Reviewed  COMPREHENSIVE METABOLIC PANEL - Abnormal; Notable for the following components:      Result Value   Sodium 133 (*)    CO2 20 (*)    Glucose, Bld 195 (*)    Total Protein 8.2 (*)    Alkaline Phosphatase 140 (*)    All other components within normal limits  CULTURE, BLOOD (ROUTINE X 2)  CULTURE, BLOOD (ROUTINE X 2)  CBC WITH DIFFERENTIAL/PLATELET     EKG None  Radiology No results found.  Procedures Procedures   Medications Ordered in ED Medications - No data to display  ED Course  I have reviewed the triage vital signs and the nursing notes.  Pertinent labs & imaging results that were available during my care of the patient were reviewed by me and considered in my medical decision making (see chart for details).  Clinical Course as of 09/13/20 2010  Thu Sep 13, 2020  1935 WBC: 5.0 [JS]  1935 Hemoglobin: 13.2 [JS]    Clinical Course User Index [JS] Sep 15, 2020, PA-C   MDM Rules/Calculators/A&P  Patient here today with skin infection, seen via telehealth by Dr. Claude Manges who treated her likely staph of her skin seen 1 week ago. Today patient reports concern for staph infection in her bloodstream.  During my arrival to the room, patient is teary eyed with husband on the phone. I asked for phone call to be discontinued and patient denied "I want my husband to hear it all". She reports concern for systemic infection as she is diabetic. She has had no fever, no vomiting, no nausea, or other systemic signs.  She is requesting IV antibiotics on today's visit for infection. I have extensively reviewed her record which showed she is currently on Bactrim and reports compliance with medication. She is requesting blood cultures on todays visit. I discussed with patient theneed for appropiate follow up with dermatology, however blood cultures can be obtained on today's visit but they will not show results for a couple of days. She states "if I went to [facility] will they come back today". I explained to patient that blood cultures will take a minimun of 2 days and she will be called with results. She reports "well I had blood drawn this morning at [facility]". I will order screening labs for her this today per her request.   CMP with no electrolyte abnormality, glucose is elevated prior history of diabetes. Creatine level is within normal  limits. LFT's are unremarkable.  CBC without any leukocytosis.  Her hemoglobin is stable.  Arrival vital signs remarkable for heart rate in the 100, patient is on the field, seems teary-eyed and worked up when we are in the room talking to the patient she gets very anxious, she is tachycardic on the monitor, when we are outside of the room heart rate appropriately decreases.  She has no other findings of acute sepsis.  She has chest pain and shortness of breath free.  No hypoxia.  Speaking full sentences and comfortable.  Do not feel the tachycardia is genuine in terms of needing further emergent medical evaluation.  No suspicion for cellulitis, low suspicion for systemic infection. Strict return precautions discussed at length.   Portions of this note were generated with Scientist, clinical (histocompatibility and immunogenetics). Dictation errors may occur despite best attempts at proofreading.  Final Clinical Impression(s) / ED Diagnoses Final diagnoses:  Discoloration of skin    Rx / DC Orders ED Discharge Orders    None       Claude Manges, PA-C 09/13/20 2010    [address], PA-C 09/13/20 2011    Haden Suder, PA-C 09/13/20 2020    Rozelle Logan, DO 09/14/20 0021

## 2020-09-13 NOTE — ED Triage Notes (Signed)
She has a staph infection on her skin. She is using a cream that is causing her skin to be discolored.

## 2020-09-17 ENCOUNTER — Encounter: Payer: Self-pay | Admitting: Family Medicine

## 2020-09-17 ENCOUNTER — Ambulatory Visit: Payer: 59 | Admitting: Medical

## 2020-09-18 LAB — CULTURE, BLOOD (ROUTINE X 2)
Culture: NO GROWTH
Culture: NO GROWTH
Special Requests: ADEQUATE
Special Requests: ADEQUATE

## 2020-09-24 ENCOUNTER — Other Ambulatory Visit: Payer: Self-pay | Admitting: Family Medicine

## 2020-10-14 ENCOUNTER — Encounter: Payer: Self-pay | Admitting: Family Medicine

## 2020-10-15 ENCOUNTER — Other Ambulatory Visit: Payer: Self-pay | Admitting: Family Medicine

## 2020-10-15 DIAGNOSIS — L739 Follicular disorder, unspecified: Secondary | ICD-10-CM

## 2020-10-15 MED ORDER — MUPIROCIN CALCIUM 2 % EX CREA
TOPICAL_CREAM | CUTANEOUS | 0 refills | Status: DC
Start: 2020-10-15 — End: 2020-12-10

## 2020-10-15 MED ORDER — SULFAMETHOXAZOLE-TRIMETHOPRIM 800-160 MG PO TABS: 1.0000 | ORAL_TABLET | Freq: Two times a day (BID) | ORAL | 0 refills | Status: DC

## 2020-10-16 NOTE — Telephone Encounter (Signed)
Requesting: acetaminophen-butalbital 50-325mg  Contract: n/a UDS: n/a Last Visit: 09/06/2020  Next Visit: 12/10/2020 Last Refill: 10/04/2019 #60 and 0RF  Please Advise

## 2020-10-21 ENCOUNTER — Other Ambulatory Visit: Payer: Self-pay | Admitting: Family Medicine

## 2020-10-22 MED ORDER — HYDROCODONE-ACETAMINOPHEN 7.5-325 MG PO TABS
1.0000 | ORAL_TABLET | Freq: Four times a day (QID) | ORAL | 0 refills | Status: DC | PRN
Start: 2020-10-22 — End: 2020-11-22

## 2020-10-22 NOTE — Telephone Encounter (Signed)
Requesting:  HYDROCODONE  Contract: 05/04/18 UDS: 10/13/17 Last Visit: 09/06/20 Next Visit: 12/10/20 Last Refill: 09/06/20  Please Advise

## 2020-11-03 ENCOUNTER — Other Ambulatory Visit: Payer: Self-pay | Admitting: Medical

## 2020-11-03 ENCOUNTER — Other Ambulatory Visit: Payer: Self-pay | Admitting: Family Medicine

## 2020-11-05 ENCOUNTER — Other Ambulatory Visit: Payer: Self-pay

## 2020-11-05 ENCOUNTER — Ambulatory Visit: Payer: 59 | Admitting: Medical

## 2020-11-05 VITALS — BP 130/80 | HR 100 | Resp 18 | Ht 63.0 in | Wt 185.6 lb

## 2020-11-05 DIAGNOSIS — F330 Major depressive disorder, recurrent, mild: Secondary | ICD-10-CM

## 2020-11-05 DIAGNOSIS — L739 Follicular disorder, unspecified: Secondary | ICD-10-CM | POA: Diagnosis not present

## 2020-11-05 DIAGNOSIS — M5416 Radiculopathy, lumbar region: Secondary | ICD-10-CM

## 2020-11-05 MED ORDER — BACLOFEN 10 MG PO TABS: 10.0000 mg | ORAL_TABLET | Freq: Three times a day (TID) | ORAL | 0 refills | Status: DC

## 2020-11-05 MED ORDER — KETOROLAC TROMETHAMINE 60 MG/2ML IM SOLN
60.0000 mg | Freq: Once | INTRAMUSCULAR | Status: AC
  Administered 2020-11-05: 60 mg via INTRAMUSCULAR

## 2020-11-05 NOTE — Progress Notes (Addendum)
Subjective:    Patient ID: Nicole Ball, female    DOB: [birth year], 45 y.o.   MRN: [id number]  HPI   Pt had area on rt hand dorsal aspect that appeared infected.  Pt had inflammed area of skin. She states they were inflammed and raised. Pt went thru few rounds of antibiotics.  Pt had skin of nose that was inflamed and with antibiotics she states.   Pt was given mupirocin topical inside nose. Pt was given 2 rounds of  bactrim DS. Both prescription were 10 days only.  Pt has no further tender areas.  Severe back pain in the past. Sometimes current regimen norco not adequate. Pt feels like she might need oxycodone rather than norco.    Review of Systems  Constitutional: Negative for chills, diaphoresis, fatigue and fever.  Respiratory: Negative for cough, chest tightness, shortness of breath and wheezing.   Cardiovascular: Negative for chest pain.  Gastrointestinal: Negative for abdominal pain.  Musculoskeletal: Negative for back pain, myalgias and neck pain.  Skin: Negative for rash.  Neurological: Negative for dizziness, tremors, seizures, speech difficulty, weakness and light-headedness.  Hematological: Negative for adenopathy. Does not bruise/bleed easily.  Psychiatric/Behavioral: Negative for behavioral problems and decreased concentration.    Past Medical History:  Diagnosis Date  . Abnormal serum level of alkaline phosphatase 05/21/2014  . Acute low back pain 12/19/2010  . ALLERGIC RHINITIS 10/16/2010  . Anxiety and depression 06/07/2011  . Atrophic vaginitis 08/17/2016  . Cervical cancer screening 01/10/2014   Menarche at 8 Regular and moderate flow, became irregular until OCPs  history of abnormal pap in past, repeat was normal Last pap roughly 3 years ago G0P0, s/p No history of abnormal MGM, no h/o MGM  No concerns today no gyn surgeries LMP May 2013  . CTS (carpal tunnel syndrome) 01/15/2014  . Diabetes mellitus type 2 in obese (HCC) 10/30/2011  . Dry eyes 08/17/2016   . Early menopause   . Essential hypertension   . HIATAL HERNIA WITH REFLUX, HX OF 10/16/2010  . Hyperlipidemia 04/21/2011  . Inappropriate sinus tachycardia   . INSOMNIA 10/16/2010  . Knee pain, left 03/11/2012  . Migraine 03/07/2013  . Morbid obesity (HCC)   . Palpitations 01/28/2015  . Reflux 04/22/2012  . Sleep apnea 06/07/2011  . Tobacco use disorder 02/10/2017  . Urinary urgency 08/17/2016  . UTI'S, HX OF 10/16/2010     Social History   Socioeconomic History  . Marital status: Married    Spouse name: Not on file  . Number of children: Not on file  . Years of education: Not on file  . Highest education level: Not on file  Occupational History  . Not on file  Tobacco Use  . Smoking status: Current Some Day Smoker    Packs/day: 0.10    Types: Cigars  . Smokeless tobacco: Never Used  . Tobacco comment: 1 black and milds  Vaping Use  . Vaping Use: Never used  Substance and Sexual Activity  . Alcohol use: Yes    Comment: twice/year  . Drug use: No  . Sexual activity: Not on file  Other Topics Concern  . Not on file  Social History Narrative   Married lives w/ husband, works at [employer]   Social Determinants of Corporate investment banker Strain: Not on [employer] Insecurity: Not on file  Transportation Needs: Not on file  Physical Activity: Not on file  Stress: Not on file  Social Connections: Not on  file  Intimate Partner Violence: Not on file    Past Surgical History:  Procedure Laterality Date  . CHOLECYSTECTOMY      Family History  Problem Relation Age of Onset  . Lupus Mother   . Arthritis Maternal Grandmother   . Scleroderma Maternal Grandmother   . Glaucoma Maternal Grandmother   . Cataracts Maternal Grandmother   . Cancer Maternal Grandfather        prostate  . Coronary artery disease Maternal Grandfather        s/p angioplasty  . Diabetes Maternal Grandfather        Type 2  . Cancer Paternal Grandmother        Breast ca- dx in 97's  .  Thyroid disease Paternal Grandmother   . Breast cancer Paternal Grandmother   . Diabetes Paternal Grandfather   . Thyroid disease Sister   . Asthma Sister   . Allergies Brother   . Cancer Father        cigarette, alcohol HEENT squamous cell  . Alcohol abuse Father   . Arthritis Other   . Alcohol abuse Maternal Aunt   . Alcohol abuse Maternal Uncle     No Known Allergies  Current Outpatient Medications on File Prior to Visit  Medication Sig Dispense Refill  . ACETAMINOPHEN-BUTALBITAL 50-325 MG TABS TAKE 1 TABLET BY MOUTH TWICE DAILY AS NEEDED FOR HEADACHES 60 tablet 0  . albuterol (VENTOLIN HFA) 108 (90 Base) MCG/ACT inhaler Inhale 2 puffs into the lungs every 6 (six) hours as needed for wheezing or shortness of breath. 8 g 0  . ALPRAZolam (XANAX) 1 MG tablet TAKE 1 TABLET(1 MG) BY MOUTH TWICE DAILY AS NEEDED FOR ANXIETY 60 tablet 1  . amphetamine-dextroamphetamine (ADDERALL XR) 30 MG 24 hr capsule Take 2 capsules (60 mg total) by mouth every morning. 60 capsule 0  . amphetamine-dextroamphetamine (ADDERALL XR) 30 MG 24 hr capsule Take 2 capsules (60 mg total) by mouth every morning. 60 capsule 0  . amphetamine-dextroamphetamine (ADDERALL XR) 30 MG 24 hr capsule Take 2 capsules (60 mg total) by mouth every morning. 60 capsule 0  . baclofen (LIORESAL) 10 MG tablet Take 1 tablet (10 mg total) by mouth 2 (two) times daily as needed for muscle spasms. 30 each 2  . benzonatate (TESSALON) 100 MG capsule Take 1 capsule (100 mg total) by mouth 2 (two) times daily as needed for cough. (Patient not taking: Reported on 11/05/2020) 40 capsule 1  . BLACK COHOSH PO Take 1 tablet by mouth daily.    . busPIRone (BUSPAR) 15 MG tablet Take 1 tablet (15 mg total) by mouth 2 (two) times daily. 60 tablet 2  . celecoxib (CELEBREX) 200 MG capsule One to 2 tablets by mouth daily as needed for pain. 60 capsule 2  . conjugated estrogens (PREMARIN) vaginal cream Apply small amount to affected area twice a week 42.5 g  1  . cyclobenzaprine (FLEXERIL) 10 MG tablet Take 1 tablet (10 mg total) by mouth 2 (two) times daily as needed for muscle spasms. 60 tablet 2  . fluconazole (DIFLUCAN) 150 MG tablet Take 1 tablet (150 mg total) by mouth once a week. 4 tablet 1  . FLUoxetine (PROZAC) 20 MG capsule TAKE 1 CAPSULE(20 MG) BY MOUTH THREE TIMES DAILY 90 capsule 5  . fluticasone (FLONASE) 50 MCG/ACT nasal spray SHAKE LIQUID AND USE 2 SPRAYS IN EACH NOSTRIL DAILY 16 g 1  . fluticasone (FLOVENT HFA) 220 MCG/ACT inhaler Inhale 1 puff into the lungs  in the morning and at bedtime. 1 each 1  . gabapentin (NEURONTIN) 600 MG tablet TAKE 1 TABLET(600 MG) BY MOUTH THREE TIMES DAILY 90 tablet 0  . Ginger, Zingiber officinalis, (GINGER PO) Take 1 tablet by mouth daily.    Marland Kitchen glimepiride (AMARYL) 2 MG tablet Take 1 tablet (2 mg total) by mouth in the morning and at bedtime. 60 tablet 3  . HYDROcodone-acetaminophen (NORCO) 7.5-325 MG tablet Take 1 tablet by mouth every 6 (six) hours as needed for moderate pain. 100 tablet 0  . hyoscyamine (LEVSIN SL) 0.125 MG SL tablet Take 1 tablet (0.125 mg total) by mouth every 4 (four) hours as needed. 30 tablet 0  . levocetirizine (XYZAL) 5 MG tablet TAKE 1 TABLET(5 MG) BY MOUTH EVERY EVENING 30 tablet 5  . losartan (COZAAR) 50 MG tablet TAKE 1 TABLET(50 MG) BY MOUTH DAILY 90 tablet 2  . metFORMIN (GLUCOPHAGE) 500 MG tablet TAKE 2 TABLETS BY MOUTH TWICE DAILY 360 tablet 3  . montelukast (SINGULAIR) 10 MG tablet TAKE 1 TABLET(10 MG) BY MOUTH AT BEDTIME 90 tablet 1  . mupirocin cream (BACTROBAN) 2 % Apply via clean qtip to nares qhs 15 g 0  . omeprazole (PRILOSEC) 20 MG capsule TAKE 1 CAPSULE(20 MG) BY MOUTH TWICE DAILY BEFORE A MEAL 180 capsule 3  . ondansetron (ZOFRAN) 4 MG tablet Take 1 tablet (4 mg total) by mouth every 8 (eight) hours as needed for nausea or vomiting. 40 tablet 1  . Probiotic Product (PROBIOTIC PO) Take 1 tablet by mouth daily.    . promethazine (PHENERGAN) 25 MG tablet Take 1  tablet (25 mg total) by mouth every 8 (eight) hours as needed for nausea or vomiting. 30 tablet 1  . propranolol (INDERAL) 60 MG tablet TAKE 1 TABLET(60 MG) BY MOUTH TWICE DAILY 180 tablet 2  . rosuvastatin (CRESTOR) 5 MG tablet Take 1 tablet (5 mg total) by mouth 3 (three) times a week. 36 tablet 3  . sodium chloride (OCEAN) 0.65 % SOLN nasal spray Place 1 spray into both nostrils daily.    Marland Kitchen sulfamethoxazole-trimethoprim (BACTRIM DS) 800-160 MG tablet Take 1 tablet by mouth 2 (two) times daily. 20 tablet 0  . SUMAtriptan (IMITREX) 50 MG tablet Take 1 tablet (50 mg total) by mouth every 2 (two) hours as needed for migraine. May repeat in 2 hours if headache persists or recurs. 10 tablet 3  . zolpidem (AMBIEN) 10 MG tablet Take 1 tablet (10 mg total) by mouth at bedtime as needed for sleep. 30 tablet 2   No current facility-administered medications on file prior to visit.    BP 130/80   Pulse 100   Resp 18   Ht 5\' 3"  (1.6 m)   Wt 185 lb 9.6 oz (84.2 kg)   LMP 01/08/2013   SpO2 98%   BMI 32.88 kg/m       Objective:   Physical Exam  General Mental Status- Alert. General Appearance- Not in acute distress.   Skin Scattered hyperpigmented scars all over her body arms, legs, thorax etc.(pt showed me area quickly before got chance to call MA)   Neck Carotid Arteries- Normal color. Moisture- Normal Moisture. No carotid bruits. No JVD.  Chest and Lung Exam Auscultation: Breath Sounds:-Normal.  Cardiovascular Auscultation:Rythm- Regular. Murmurs & Other Heart Sounds:Auscultation of the heart reveals- No Murmurs.  Abdomen Inspection:-Inspeection Normal. Palpation/Percussion:Note:No mass. Palpation and Percussion of the abdomen reveal- Non Tender, Non Distended + BS, no rebound or guarding.   Neurologic Cranial  Nerve exam:- CN III-XII intact(No nystagmus), symmetric smile. Strength:- 5/5 equal and symmetric strength both upper and lower extremities.      Assessment & Plan:   You appear to have improved diffuse folliculitis all over her body. I think at this point you don't need further oral antibiotics. Do recommend that you continue mupirocin intranasally.  Recommend that you use moisturizer with vit E to body. Can use topical mupirocin to rt hand healing scab.  [facility] Please call them back.  For depression follow up with psychiatrist.  Hx of low back pain. Has seen specialist sports med. Pt wants rx of baclofen to use if needed(for muscle spasm). Rx advisement given.  After discussion decided to give toradol 60 mg im injection today.  Follow up as regular scheduled with pcp or as needed  Esperanza RichtersEdward Sefora Tietje, PA-C

## 2020-11-05 NOTE — Patient Instructions (Addendum)
You appear to have improved diffuse folliculitis all over her body. I think at this point you don't need further oral antibiotics. Do recommend that you continue mupirocin intranasally.  Recommend that you use moisturizer with vit E to body. Can use topical mupirocin to rt hand healing scab.  Keep appointment with [facility]. Please call them back.  For depression follow up with psychiatrist.  Hx of low back pain. Has seen specialist sports med. Pt wants rx of baclofen to use if needed(for muscle spasm). Rx advisement given. You have norco 7.5 which your pcp Dr. Abner Greenspan just gave. Advise to my chart pcp if you feel you needs change of regimen.  After discussion decided to give toradol 60 mg im injection today.  Follow up as regular scheduled with pcp or as needed

## 2020-11-05 NOTE — Addendum Note (Signed)
Addended by: Maximino Sarin on: 11/05/2020 05:03 PM   Modules accepted: Orders

## 2020-11-06 ENCOUNTER — Telehealth (INDEPENDENT_AMBULATORY_CARE_PROVIDER_SITE_OTHER): Payer: 59 | Admitting: Psychiatry

## 2020-11-06 DIAGNOSIS — F410 Panic disorder [episodic paroxysmal anxiety] without agoraphobia: Secondary | ICD-10-CM

## 2020-11-06 DIAGNOSIS — F411 Generalized anxiety disorder: Secondary | ICD-10-CM

## 2020-11-06 DIAGNOSIS — F330 Major depressive disorder, recurrent, mild: Secondary | ICD-10-CM | POA: Diagnosis not present

## 2020-11-06 MED ORDER — DULOXETINE HCL 30 MG PO CPEP: ORAL_CAPSULE | ORAL | 0 refills | Status: DC

## 2020-11-06 MED ORDER — BUSPIRONE HCL 15 MG PO TABS: 15.0000 mg | ORAL_TABLET | Freq: Two times a day (BID) | ORAL | 2 refills | Status: AC

## 2020-11-06 MED ORDER — AMPHETAMINE-DEXTROAMPHET ER 30 MG PO CP24: 60.0000 mg | ORAL_CAPSULE | ORAL | 0 refills | Status: DC

## 2020-11-06 NOTE — Progress Notes (Signed)
BH MD/PA/NP OP Progress Note  11/06/2020 10:24 AM Nicole Ball  MRN:  [id number] Interview was conducted by phone and I verified that I was speaking with the correct person using two identifiers. I discussed the limitations of evaluation and management by telemedicine and  the availability of in person appointments. Patient expressed understanding and agreed to proceed. Participants in the visit: patient (location - home); physician (location - home office).  Chief Complaint: Anxiety.  HPI: 45yo married AAFwith along standinghx ofdepression (on and off) and generalized anxiety. She used to have frequent panic attacks andis prescribed alprazolam1mg  bid prn these. She is on buspirone and fluoxetine combination for depression/anxiety. In thepast she tried citalopram and venlafaxine but could not tolerate excessive sweating and other adverse effects which now, in perspective, she thinks might have been more related to her menopausal symptoms. 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 She has never been formally diagnosed with ADD.We added Adderall XRand gradually arrived at 60 mg daily as the dose which provides optimal benefit: improvementinfocusing ability, less fatigue. Anxiety however still is present and she continues to use alprazolam daily (less need for zolpidem as sleep has improved). This is mostly work-stress related.   Visit Diagnosis:    ICD-10-CM   1. GAD (generalized anxiety disorder)  F41.1   2. Panic disorder without agoraphobia  F41.0   3. Major depressive disorder, recurrent, mild (HCC)  F33.0     Past Psychiatric History: Please see intake H&P.  Past Medical History:  Past Medical History:  Diagnosis Date  . Abnormal serum level of alkaline phosphatase 05/21/2014  . Acute low back  pain 12/19/2010  . ALLERGIC RHINITIS 10/16/2010  . Anxiety and depression 06/07/2011  . Atrophic vaginitis 08/17/2016  . Cervical cancer screening 01/10/2014   Menarche at 8 Regular and moderate flow, became irregular until OCPs  history of abnormal pap in past, repeat was normal Last pap roughly 3 years ago G0P0, s/p No history of abnormal MGM, no h/o MGM  No concerns today no gyn surgeries LMP May 2013  . CTS (carpal tunnel syndrome) 01/15/2014  . Diabetes mellitus type 2 in obese (HCC) 10/30/2011  . Dry eyes 08/17/2016  . Early menopause   . Essential hypertension   . HIATAL HERNIA WITH REFLUX, HX OF 10/16/2010  . Hyperlipidemia 04/21/2011  . Inappropriate sinus tachycardia   . INSOMNIA 10/16/2010  . Knee pain, left 03/11/2012  . Migraine 03/07/2013  . Morbid obesity (HCC)   . Palpitations 01/28/2015  . Reflux 04/22/2012  . Sleep apnea 06/07/2011  . Tobacco use disorder 02/10/2017  . Urinary urgency 08/17/2016  . UTI'S, HX OF 10/16/2010    Past Surgical History:  Procedure Laterality Date  . CHOLECYSTECTOMY      Family Psychiatric History: Reviewed.  Family History:  Family History  Problem Relation Age of Onset  . Lupus Mother   . Arthritis Maternal Grandmother   . Scleroderma Maternal Grandmother   . Glaucoma Maternal Grandmother   . Cataracts Maternal Grandmother   . Cancer Maternal Grandfather        prostate  . Coronary artery disease Maternal Grandfather        s/p angioplasty  . Diabetes Maternal Grandfather        Type 2  . Cancer Paternal Grandmother        Breast ca- dx  in 50's  . Thyroid disease Paternal Grandmother   . Breast cancer Paternal Grandmother   . Diabetes Paternal Grandfather   . Thyroid disease Sister   . Asthma Sister   . Allergies Brother   . Cancer Father        cigarette, alcohol HEENT squamous cell  . Alcohol abuse Father   . Arthritis Other   . Alcohol abuse Maternal Aunt   . Alcohol abuse Maternal Uncle     Social History:  Social History    Socioeconomic History  . Marital status: Married    Spouse name: Not on file  . Number of children: Not on file  . Years of education: Not on file  . Highest education level: Not on file  Occupational History  . Not on file  Tobacco Use  . Smoking status: Current Some Day Smoker    Packs/day: 0.10    Types: Cigars  . Smokeless tobacco: Never Used  . Tobacco comment: 1 black and milds  Vaping Use  . Vaping Use: Never used  Substance and Sexual Activity  . Alcohol use: Yes    Comment: twice/year  . Drug use: No  . Sexual activity: Not on file  Other Topics Concern  . Not on file  Social History Narrative   Married lives w/ husband, works at [employer]   Social Determinants of Health   Financial Resource Strain: Not on file  Food Insecurity: Not on file  Transportation Needs: Not on file  Physical Activity: Not on file  Stress: Not on file  Social Connections: Not on file    Allergies: No Known Allergies  Metabolic Disorder Labs: Lab Results  Component Value Date   HGBA1C 10.0 (H) 09/13/2020   MPG 140 (H) 05/19/2014   MPG 148 (H) 10/14/2013   No results found for: PROLACTIN Lab Results  Component Value Date   CHOL 182 09/13/2020   TRIG 160.0 (H) 09/13/2020   HDL 63.90 09/13/2020   CHOLHDL 3 09/13/2020   VLDL 32.0 09/13/2020   LDLCALC 86 09/13/2020   LDLCALC 87 09/19/2019   Lab Results  Component Value Date   TSH 1.81 09/13/2020   TSH 1.590 09/19/2019    Therapeutic Level Labs: No results found for: LITHIUM No results found for: VALPROATE No components found for:  CBMZ  Current Medications: Current Outpatient Medications  Medication Sig Dispense Refill  . DULoxetine (CYMBALTA) 30 MG capsule Take 1 capsule (30 mg total) by mouth daily for 10 days, THEN 2 capsules (60 mg total) daily. 130 capsule 0  . ACETAMINOPHEN-BUTALBITAL 50-325 MG TABS TAKE 1 TABLET BY MOUTH TWICE DAILY AS NEEDED FOR HEADACHES 60 tablet 0  . albuterol (VENTOLIN HFA) 108  (90 Base) MCG/ACT inhaler Inhale 2 puffs into the lungs every 6 (six) hours as needed for wheezing or shortness of breath. 8 g 0  . ALPRAZolam (XANAX) 1 MG tablet TAKE 1 TABLET(1 MG) BY MOUTH TWICE DAILY AS NEEDED FOR ANXIETY 60 tablet 1  . amphetamine-dextroamphetamine (ADDERALL XR) 30 MG 24 hr capsule Take 2 capsules (60 mg total) by mouth every morning. 60 capsule 0  . [START ON 01/14/2021] amphetamine-dextroamphetamine (ADDERALL XR) 30 MG 24 hr capsule Take 2 capsules (60 mg total) by mouth every morning. 60 capsule 0  . [START ON 12/14/2020] amphetamine-dextroamphetamine (ADDERALL XR) 30 MG 24 hr capsule Take 2 capsules (60 mg total) by mouth every morning. 60 capsule 0  . baclofen (LIORESAL) 10 MG tablet Take 1 tablet (10 mg total)  by mouth 2 (two) times daily as needed for muscle spasms. 30 each 2  . baclofen (LIORESAL) 10 MG tablet Take 1 tablet (10 mg total) by mouth 3 (three) times daily. 30 each 0  . benzonatate (TESSALON) 100 MG capsule Take 1 capsule (100 mg total) by mouth 2 (two) times daily as needed for cough. (Patient not taking: Reported on 11/05/2020) 40 capsule 1  . BLACK COHOSH PO Take 1 tablet by mouth daily.    Melene Muller ON 12/14/2020] busPIRone (BUSPAR) 15 MG tablet Take 1 tablet (15 mg total) by mouth 2 (two) times daily. 60 tablet 2  . celecoxib (CELEBREX) 200 MG capsule One to 2 tablets by mouth daily as needed for pain. 60 capsule 2  . conjugated estrogens (PREMARIN) vaginal cream Apply small amount to affected area twice a week 42.5 g 1  . cyclobenzaprine (FLEXERIL) 10 MG tablet Take 1 tablet (10 mg total) by mouth 2 (two) times daily as needed for muscle spasms. 60 tablet 2  . fluconazole (DIFLUCAN) 150 MG tablet Take 1 tablet (150 mg total) by mouth once a week. 4 tablet 1  . fluticasone (FLONASE) 50 MCG/ACT nasal spray SHAKE LIQUID AND USE 2 SPRAYS IN EACH NOSTRIL DAILY 16 g 1  . fluticasone (FLOVENT HFA) 220 MCG/ACT inhaler Inhale 1 puff into the lungs in the morning and at  bedtime. 1 each 1  . gabapentin (NEURONTIN) 600 MG tablet TAKE 1 TABLET(600 MG) BY MOUTH THREE TIMES DAILY 90 tablet 0  . Ginger, Zingiber officinalis, (GINGER PO) Take 1 tablet by mouth daily.    Marland Kitchen glimepiride (AMARYL) 2 MG tablet Take 1 tablet (2 mg total) by mouth in the morning and at bedtime. 60 tablet 3  . HYDROcodone-acetaminophen (NORCO) 7.5-325 MG tablet Take 1 tablet by mouth every 6 (six) hours as needed for moderate pain. 100 tablet 0  . hyoscyamine (LEVSIN SL) 0.125 MG SL tablet Take 1 tablet (0.125 mg total) by mouth every 4 (four) hours as needed. 30 tablet 0  . levocetirizine (XYZAL) 5 MG tablet TAKE 1 TABLET(5 MG) BY MOUTH EVERY EVENING 30 tablet 5  . losartan (COZAAR) 50 MG tablet TAKE 1 TABLET(50 MG) BY MOUTH DAILY 90 tablet 2  . metFORMIN (GLUCOPHAGE) 500 MG tablet TAKE 2 TABLETS BY MOUTH TWICE DAILY 360 tablet 3  . montelukast (SINGULAIR) 10 MG tablet TAKE 1 TABLET(10 MG) BY MOUTH AT BEDTIME 90 tablet 1  . mupirocin cream (BACTROBAN) 2 % Apply via clean qtip to nares qhs 15 g 0  . omeprazole (PRILOSEC) 20 MG capsule TAKE 1 CAPSULE(20 MG) BY MOUTH TWICE DAILY BEFORE A MEAL 180 capsule 3  . ondansetron (ZOFRAN) 4 MG tablet Take 1 tablet (4 mg total) by mouth every 8 (eight) hours as needed for nausea or vomiting. 40 tablet 1  . Probiotic Product (PROBIOTIC PO) Take 1 tablet by mouth daily.    . promethazine (PHENERGAN) 25 MG tablet Take 1 tablet (25 mg total) by mouth every 8 (eight) hours as needed for nausea or vomiting. 30 tablet 1  . propranolol (INDERAL) 60 MG tablet TAKE 1 TABLET(60 MG) BY MOUTH TWICE DAILY 180 tablet 2  . rosuvastatin (CRESTOR) 5 MG tablet Take 1 tablet (5 mg total) by mouth 3 (three) times a week. 36 tablet 3  . sodium chloride (OCEAN) 0.65 % SOLN nasal spray Place 1 spray into both nostrils daily.    Marland Kitchen sulfamethoxazole-trimethoprim (BACTRIM DS) 800-160 MG tablet Take 1 tablet by mouth 2 (two)  times daily. 20 tablet 0  . SUMAtriptan (IMITREX) 50 MG  tablet Take 1 tablet (50 mg total) by mouth every 2 (two) hours as needed for migraine. May repeat in 2 hours if headache persists or recurs. 10 tablet 3  . zolpidem (AMBIEN) 10 MG tablet Take 1 tablet (10 mg total) by mouth at bedtime as needed for sleep. 30 tablet 2   No current facility-administered medications for this visit.      Psychiatric Specialty Exam: Review of Systems  Psychiatric/Behavioral: The patient is nervous/anxious.   All other systems reviewed and are negative.   Last menstrual period 01/08/2013.There is no height or weight on file to calculate BMI.  General Appearance: NA  Eye Contact:  NA  Speech:  Clear and Coherent and Normal Rate  Volume:  Normal  Mood:  Anxious  Affect:  NA  Thought Process:  Goal Directed and Linear  Orientation:  Full (Time, Place, and Person)  Thought Content: Rumination   Suicidal Thoughts:  No  Homicidal Thoughts:  No  Memory:  Immediate;   Good Recent;   Good Remote;   Good  Judgement:  Good  Insight:  Good  Psychomotor Activity:  NA  Concentration:  Concentration: Good  Recall:  Good  Fund of Knowledge: Good  Language: Good  Akathisia:  Negative  Handed:  Right  AIMS (if indicated): not done  Assets:  Communication Skills Desire for Improvement Financial Resources/Insurance Housing Social Support Vocational/Educational  ADL's:  Intact  Cognition: WNL  Sleep:  Fair   Screenings: Flowsheet Row ED from 09/13/2020 in [facility]  C-SSRS RISK CATEGORY No Risk       Assessment and Plan: 44yo married AAFwith along standinghx ofdepression (on and off) and generalized anxiety. She used to have frequent panic attacks andis prescribed alprazolam1mg  bid prn these. She is on buspirone and fluoxetine combination for depression/anxiety. In thepast she tried citalopram and venlafaxine but could not tolerate excessive sweating and other adverse effects which now, in perspective, she thinks  might have been more related to her menopausal symptoms. 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 She has never been formally diagnosed with ADD.We added Adderall XRand gradually arrived at 60 mg daily as the dose which provides optimal benefit: improvementinfocusing ability, less fatigue. Anxiety however still is present and she continues to use alprazolam daily (less need for zolpidem as sleep has improved). This is mostly work-stress related.  Dx: GAD; Panic disorder; ADD adult type; MDD recurrent, mild  Plan: Continue Buspar 15 mg bid, zolpidem 10 mg prn sleep,alprazolamto1mg bidprn panic attacksandAdderallXRto60mg  daily.We will cross taper fluoxetine to duloxetine 60 mg daily to hopefully gain better control of anxiety. Next appointment intwomonths.The plan was discussed with patient who had an opportunity to ask questions and these were all answered. I spend82minutes inphone consultationwith the patient.    Magdalene Patricia, MD 11/06/2020, 10:24 AM

## 2020-11-22 ENCOUNTER — Other Ambulatory Visit: Payer: Self-pay | Admitting: Family Medicine

## 2020-11-23 ENCOUNTER — Other Ambulatory Visit: Payer: Self-pay | Admitting: Family Medicine

## 2020-11-26 MED ORDER — HYDROCODONE-ACETAMINOPHEN 7.5-325 MG PO TABS: 1.0000 | ORAL_TABLET | Freq: Four times a day (QID) | ORAL | 0 refills | Status: DC | PRN

## 2020-11-26 NOTE — Telephone Encounter (Signed)
Requesting: hydrocodone 7.5-325mg  Contract: 09/10/2017 UDS: 05/04/2018 Last Visit: 11/05/20 w/ Ramon Dredge Next Visit: 12/10/20 Last Refill: 10/22/20 #100 and 0RF Pt sig:; 1 tab q6h prn  Please Advise

## 2020-12-10 ENCOUNTER — Ambulatory Visit: Payer: 59 | Admitting: Family Medicine

## 2020-12-10 ENCOUNTER — Other Ambulatory Visit: Payer: Self-pay

## 2020-12-10 DIAGNOSIS — L309 Dermatitis, unspecified: Secondary | ICD-10-CM | POA: Insufficient documentation

## 2020-12-10 DIAGNOSIS — Z124 Encounter for screening for malignant neoplasm of cervix: Secondary | ICD-10-CM

## 2020-12-10 DIAGNOSIS — Z79899 Other long term (current) drug therapy: Secondary | ICD-10-CM

## 2020-12-10 DIAGNOSIS — E782 Mixed hyperlipidemia: Secondary | ICD-10-CM

## 2020-12-10 DIAGNOSIS — M5416 Radiculopathy, lumbar region: Secondary | ICD-10-CM

## 2020-12-10 DIAGNOSIS — Z1211 Encounter for screening for malignant neoplasm of colon: Secondary | ICD-10-CM

## 2020-12-10 DIAGNOSIS — E669 Obesity, unspecified: Secondary | ICD-10-CM

## 2020-12-10 DIAGNOSIS — B958 Unspecified staphylococcus as the cause of diseases classified elsewhere: Secondary | ICD-10-CM

## 2020-12-10 DIAGNOSIS — Z1159 Encounter for screening for other viral diseases: Secondary | ICD-10-CM

## 2020-12-10 DIAGNOSIS — U071 COVID-19: Secondary | ICD-10-CM

## 2020-12-10 DIAGNOSIS — E1169 Type 2 diabetes mellitus with other specified complication: Secondary | ICD-10-CM | POA: Diagnosis not present

## 2020-12-10 DIAGNOSIS — M791 Myalgia, unspecified site: Secondary | ICD-10-CM

## 2020-12-10 DIAGNOSIS — G4700 Insomnia, unspecified: Secondary | ICD-10-CM | POA: Diagnosis not present

## 2020-12-10 DIAGNOSIS — G43809 Other migraine, not intractable, without status migrainosus: Secondary | ICD-10-CM

## 2020-12-10 DIAGNOSIS — I10 Essential (primary) hypertension: Secondary | ICD-10-CM

## 2020-12-10 MED ORDER — MUPIROCIN CALCIUM 2 % EX CREA: TOPICAL_CREAM | CUTANEOUS | 1 refills | Status: DC

## 2020-12-10 NOTE — Progress Notes (Signed)
Subjective:    Patient ID: Nicole Ball, female    DOB: 12-03-1975, 45 y.o.   MRN: [id number]  No chief complaint on file.   HPI Patient is in today for follow up on chronic medical concerns including such as staph infections, hyperlipidemia, hyperglycemia and more. No recent febrile illness or hospitalizations. She is noting her skin is much better with her current regimen. She is not having recurrent new lesions like she was having. She has been under a great deal of stress but she feels she is managing it again. Denies CP/palp/SOB/HA/congestion/fevers/GI or GU c/o. Taking meds as prescribed  Past Medical History:  Diagnosis Date  . Abnormal serum level of alkaline phosphatase 05/21/2014  . Acute low back pain 12/19/2010  . ALLERGIC RHINITIS 10/16/2010  . Anxiety and depression 06/07/2011  . Atrophic vaginitis 08/17/2016  . Cervical cancer screening 01/10/2014   Menarche at 8 Regular and moderate flow, became irregular until OCPs  history of abnormal pap in past, repeat was normal Last pap roughly 3 years ago G0P0, s/p No history of abnormal MGM, no h/o MGM  No concerns today no gyn surgeries LMP May 2013  . CTS (carpal tunnel syndrome) 01/15/2014  . Diabetes mellitus type 2 in obese (HCC) 10/30/2011  . Dry eyes 08/17/2016  . Early menopause   . Essential hypertension   . HIATAL HERNIA WITH REFLUX, HX OF 10/16/2010  . Hyperlipidemia 04/21/2011  . Inappropriate sinus tachycardia   . INSOMNIA 10/16/2010  . Knee pain, left 03/11/2012  . Migraine 03/07/2013  . Morbid obesity (HCC)   . Palpitations 01/28/2015  . Reflux 04/22/2012  . Sleep apnea 06/07/2011  . Tobacco use disorder 02/10/2017  . Urinary urgency 08/17/2016  . UTI'S, HX OF 10/16/2010    Past Surgical History:  Procedure Laterality Date  . CHOLECYSTECTOMY      Family History  Problem Relation Age of Onset  . Lupus Mother   . Arthritis Maternal Grandmother   . Scleroderma Maternal Grandmother   . Glaucoma Maternal Grandmother   .  Cataracts Maternal Grandmother   . Cancer Maternal Grandfather        prostate  . Coronary artery disease Maternal Grandfather        s/p angioplasty  . Diabetes Maternal Grandfather        Type 2  . Cancer Paternal Grandmother        Breast ca- dx in 25's  . Thyroid disease Paternal Grandmother   . Breast cancer Paternal Grandmother   . Diabetes Paternal Grandfather   . Thyroid disease Sister   . Asthma Sister   . Allergies Brother   . Cancer Father        cigarette, alcohol HEENT squamous cell  . Alcohol abuse Father   . Arthritis Other   . Alcohol abuse Maternal Aunt   . Alcohol abuse Maternal Uncle     Social History   Socioeconomic History  . Marital status: Married    Spouse name: Not on file  . Number of children: Not on file  . Years of education: Not on file  . Highest education level: Not on file  Occupational History  . Not on file  Tobacco Use  . Smoking status: Current Some Day Smoker    Packs/day: 0.10    Types: Cigars  . Smokeless tobacco: Never Used  . Tobacco comment: 1 black and milds  Vaping Use  . Vaping Use: Never used  Substance and Sexual Activity  . Alcohol  use: Yes    Comment: twice/year  . Drug use: No  . Sexual activity: Not on file  Other Topics Concern  . Not on file  Social History Narrative   Married lives w/ husband, works at [employer]   Social Determinants of Corporate investment banker Strain: Not on [employer] Insecurity: Not on file  Transportation Needs: Not on file  Physical Activity: Not on file  Stress: Not on file  Social Connections: Not on file  Intimate Partner Violence: Not on file    Outpatient Medications Prior to Visit  Medication Sig Dispense Refill  . ACETAMINOPHEN-BUTALBITAL 50-325 MG TABS TAKE 1 TABLET BY MOUTH TWICE DAILY AS NEEDED FOR HEADACHES 60 tablet 0  . albuterol (VENTOLIN HFA) 108 (90 Base) MCG/ACT inhaler Inhale 2 puffs into the lungs every 6 (six) hours as needed for wheezing or  shortness of breath. 8 g 0  . ALPRAZolam (XANAX) 1 MG tablet TAKE 1 TABLET(1 MG) BY MOUTH TWICE DAILY AS NEEDED FOR ANXIETY 60 tablet 1  . [START ON 01/14/2021] amphetamine-dextroamphetamine (ADDERALL XR) 30 MG 24 hr capsule Take 2 capsules (60 mg total) by mouth every morning. 60 capsule 0  . [START ON 12/14/2020] amphetamine-dextroamphetamine (ADDERALL XR) 30 MG 24 hr capsule Take 2 capsules (60 mg total) by mouth every morning. 60 capsule 0  . baclofen (LIORESAL) 10 MG tablet Take 1 tablet (10 mg total) by mouth 2 (two) times daily as needed for muscle spasms. 30 each 2  . baclofen (LIORESAL) 10 MG tablet Take 1 tablet (10 mg total) by mouth 3 (three) times daily. 30 each 0  . BLACK COHOSH PO Take 1 tablet by mouth daily.    Melene Muller ON 12/14/2020] busPIRone (BUSPAR) 15 MG tablet Take 1 tablet (15 mg total) by mouth 2 (two) times daily. 60 tablet 2  . celecoxib (CELEBREX) 200 MG capsule One to 2 tablets by mouth daily as needed for pain. 60 capsule 2  . conjugated estrogens (PREMARIN) vaginal cream Apply small amount to affected area twice a week 42.5 g 1  . cyclobenzaprine (FLEXERIL) 10 MG tablet Take 1 tablet (10 mg total) by mouth 2 (two) times daily as needed for muscle spasms. 60 tablet 2  . DULoxetine (CYMBALTA) 30 MG capsule Take 1 capsule (30 mg total) by mouth daily for 10 days, THEN 2 capsules (60 mg total) daily. 130 capsule 0  . fluconazole (DIFLUCAN) 150 MG tablet Take 1 tablet (150 mg total) by mouth once a week. 4 tablet 1  . fluticasone (FLONASE) 50 MCG/ACT nasal spray SHAKE LIQUID AND USE 2 SPRAYS IN EACH NOSTRIL DAILY 16 g 1  . fluticasone (FLOVENT HFA) 220 MCG/ACT inhaler Inhale 1 puff into the lungs in the morning and at bedtime. 1 each 1  . gabapentin (NEURONTIN) 600 MG tablet TAKE 1 TABLET(600 MG) BY MOUTH THREE TIMES DAILY 90 tablet 0  . Ginger, Zingiber officinalis, (GINGER PO) Take 1 tablet by mouth daily.    Marland Kitchen glimepiride (AMARYL) 2 MG tablet Take 1 tablet (2 mg total) by  mouth in the morning and at bedtime. 60 tablet 3  . HYDROcodone-acetaminophen (NORCO) 7.5-325 MG tablet Take 1 tablet by mouth every 6 (six) hours as needed for moderate pain. 100 tablet 0  . hyoscyamine (LEVSIN SL) 0.125 MG SL tablet Take 1 tablet (0.125 mg total) by mouth every 4 (four) hours as needed. 30 tablet 0  . levocetirizine (XYZAL) 5 MG tablet TAKE 1 TABLET(5 MG) BY  MOUTH EVERY EVENING 30 tablet 5  . losartan (COZAAR) 50 MG tablet TAKE 1 TABLET(50 MG) BY MOUTH DAILY 90 tablet 2  . metFORMIN (GLUCOPHAGE) 500 MG tablet TAKE 2 TABLETS BY MOUTH TWICE DAILY 360 tablet 3  . montelukast (SINGULAIR) 10 MG tablet TAKE 1 TABLET(10 MG) BY MOUTH AT BEDTIME 90 tablet 1  . Probiotic Product (PROBIOTIC PO) Take 1 tablet by mouth daily.    . promethazine (PHENERGAN) 25 MG tablet Take 1 tablet (25 mg total) by mouth every 8 (eight) hours as needed for nausea or vomiting. 30 tablet 1  . propranolol (INDERAL) 60 MG tablet TAKE 1 TABLET(60 MG) BY MOUTH TWICE DAILY 180 tablet 2  . rosuvastatin (CRESTOR) 5 MG tablet Take 1 tablet (5 mg total) by mouth 3 (three) times a week. 36 tablet 3  . sodium chloride (OCEAN) 0.65 % SOLN nasal spray Place 1 spray into both nostrils daily.    Marland Kitchen. sulfamethoxazole-trimethoprim (BACTRIM DS) 800-160 MG tablet Take 1 tablet by mouth 2 (two) times daily. 20 tablet 0  . SUMAtriptan (IMITREX) 50 MG tablet Take 1 tablet (50 mg total) by mouth every 2 (two) hours as needed for migraine. May repeat in 2 hours if headache persists or recurs. 10 tablet 3  . zolpidem (AMBIEN) 10 MG tablet Take 1 tablet (10 mg total) by mouth at bedtime as needed for sleep. 30 tablet 2  . mupirocin cream (BACTROBAN) 2 % Apply via clean qtip to nares qhs 15 g 0  . amphetamine-dextroamphetamine (ADDERALL XR) 30 MG 24 hr capsule Take 2 capsules (60 mg total) by mouth every morning. 60 capsule 0  . omeprazole (PRILOSEC) 20 MG capsule TAKE 1 CAPSULE(20 MG) BY MOUTH TWICE DAILY BEFORE A MEAL 180 capsule 3  .  ondansetron (ZOFRAN) 4 MG tablet TAKE 1 TABLET(4 MG) BY MOUTH EVERY 8 HOURS AS NEEDED FOR NAUSEA OR VOMITING 40 tablet 1  . benzonatate (TESSALON) 100 MG capsule Take 1 capsule (100 mg total) by mouth 2 (two) times daily as needed for cough. (Patient not taking: Reported on 11/05/2020) 40 capsule 1   No facility-administered medications prior to visit.    No Known Allergies  Review of Systems  Constitutional: Negative for fever and malaise/fatigue.  HENT: Negative for congestion.   Eyes: Negative for blurred vision.  Respiratory: Negative for shortness of breath.   Cardiovascular: Negative for chest pain, palpitations and leg swelling.  Gastrointestinal: Negative for abdominal pain, blood in stool and nausea.  Genitourinary: Negative for dysuria and frequency.  Musculoskeletal: Positive for back pain. Negative for falls.  Skin: Positive for rash.  Neurological: Negative for dizziness, loss of consciousness and headaches.  Endo/Heme/Allergies: Negative for environmental allergies.  Psychiatric/Behavioral: Negative for depression. The patient is not nervous/anxious.        Objective:    Physical Exam Vitals and nursing note reviewed.  Constitutional:      General: She is not in acute distress.    Appearance: She is well-developed.  HENT:     Head: Normocephalic and atraumatic.     Nose: Nose normal.  Eyes:     General:        Right eye: No discharge.        Left eye: No discharge.  Cardiovascular:     Rate and Rhythm: Normal rate and regular rhythm.     Heart sounds: No murmur heard.   Pulmonary:     Effort: Pulmonary effort is normal.     Breath sounds: Normal breath  sounds.  Abdominal:     General: Bowel sounds are normal.     Palpations: Abdomen is soft.     Tenderness: There is no abdominal tenderness.  Musculoskeletal:     Cervical back: Normal range of motion and neck supple.  Skin:    General: Skin is warm and dry.  Neurological:     Mental Status: She is  alert and oriented to person, place, and time.     LMP 01/08/2013  Wt Readings from Last 3 Encounters:  11/05/20 185 lb 9.6 oz (84.2 kg)  09/13/20 197 lb 1.5 oz (89.4 kg)  02/08/20 197 lb (89.4 kg)    Diabetic Foot Exam - Simple   Simple Foot Form Visual Inspection No deformities, no ulcerations, no other skin breakdown bilaterally: Yes Sensation Testing Intact to touch and monofilament testing bilaterally: Yes Pulse Check Posterior Tibialis and Dorsalis pulse intact bilaterally: Yes Comments    Lab Results  Component Value Date   WBC 5.0 09/13/2020   HGB 13.2 09/13/2020   HCT 40.2 09/13/2020   PLT 208 09/13/2020   GLUCOSE 195 (H) 09/13/2020   CHOL 182 09/13/2020   TRIG 160.0 (H) 09/13/2020   HDL 63.90 09/13/2020   LDLCALC 86 09/13/2020   ALT 34 09/13/2020   AST 24 09/13/2020   NA 133 (L) 09/13/2020   K 4.0 09/13/2020   CL 103 09/13/2020   CREATININE 0.77 09/13/2020   BUN 16 09/13/2020   CO2 20 (L) 09/13/2020   TSH 1.81 09/13/2020   HGBA1C 10.0 (H) 09/13/2020   MICROALBUR <0.7 09/17/2015    Lab Results  Component Value Date   TSH 1.81 09/13/2020   Lab Results  Component Value Date   WBC 5.0 09/13/2020   HGB 13.2 09/13/2020   HCT 40.2 09/13/2020   MCV 87.2 09/13/2020   PLT 208 09/13/2020   Lab Results  Component Value Date   NA 133 (L) 09/13/2020   K 4.0 09/13/2020   CO2 20 (L) 09/13/2020   GLUCOSE 195 (H) 09/13/2020   BUN 16 09/13/2020   CREATININE 0.77 09/13/2020   BILITOT 0.3 09/13/2020   ALKPHOS 140 (H) 09/13/2020   AST 24 09/13/2020   ALT 34 09/13/2020   PROT 8.2 (H) 09/13/2020   ALBUMIN 4.2 09/13/2020   CALCIUM 9.7 09/13/2020   ANIONGAP 10 09/13/2020   GFR 75.77 09/13/2020   Lab Results  Component Value Date   CHOL 182 09/13/2020   Lab Results  Component Value Date   HDL 63.90 09/13/2020   Lab Results  Component Value Date   LDLCALC 86 09/13/2020   Lab Results  Component Value Date   TRIG 160.0 (H) 09/13/2020   Lab  Results  Component Value Date   CHOLHDL 3 09/13/2020   Lab Results  Component Value Date   HGBA1C 10.0 (H) 09/13/2020       Assessment & Plan:   Problem List Items Addressed This Visit    Insomnia   Hyperlipidemia - Primary    Encouraged heart healthy diet, increase exercise, avoid trans fats, consider a krill oil cap daily      Relevant Orders   Lipid panel   Diabetes mellitus type 2 in obese (HCC)    hgba1c unacceptable, minimize simple carbs. Increase exercise as tolerated. Continue current meds      Relevant Orders   Hemoglobin A1c   CBC   Migraine    More frequent and painful since covid. Encouraged increased hydration, 64 ounces of clear fluids daily.  Minimize alcohol and caffeine. Eat small frequent meals with lean proteins and complex carbs. Avoid high and low blood sugars. Get adequate sleep, 7-8 hours a night. Needs exercise daily preferably in the morning.      Cervical cancer screening   Relevant Orders   Ambulatory referral to Obstetrics / Gynecology   Hypertension    Well controlled, no changes to meds. Encouraged heart healthy diet such as the DASH diet and exercise as tolerated.       Relevant Orders   CBC   Comprehensive metabolic panel   TSH   Myalgia    Flared since covid and notes brain fog as well.       Lumbar radiculopathy    With right lower extremity pain. Gabapentin helps. Hydrocodone for severe pain. Encouraged moist heat and gentle stretching as tolerated. May try NSAIDs and prescription meds as directed and report if symptoms worsen or seek immediate care      COVID    She had Covid recently and that flared her back pain. She is feeling some better now. Was at least her 3 rd case. She notes the inhalers are helping the congestion and SOB. Her headaches are worse and the meds help that some. Encouraged increased hydration, 64 ounces of clear fluids daily. Minimize alcohol and caffeine. Eat small frequent meals with lean proteins and  complex carbs. Avoid high and low blood sugars. Get adequate sleep, 7-8 hours a night. Needs exercise daily preferably in the morning.       Relevant Medications   mupirocin cream (BACTROBAN) 2 %   Staph infection    Carriage in nares and spread to skin and now she is feeling much better and her lesions are improving. 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 Continue Salicylic acid washes which seem to be helping.        Other Visit Diagnoses    Colon cancer screening       Relevant Orders   Ambulatory referral to Gastroenterology   Encounter for hepatitis C screening test for low risk patient       Relevant Orders   Hepatitis C Antibody   High risk medication use       Relevant Orders   DRUG MONITORING, PANEL 8 WITH CONFIRMATION, URINE      I have discontinued Andriea Z. Xin's benzonatate. I am also having her maintain her sodium chloride, hyoscyamine, Premarin, BLACK COHOSH PO, (Ginger, Zingiber officinalis, (GINGER PO)), Probiotic Product (PROBIOTIC PO), celecoxib, baclofen, SUMAtriptan, montelukast, rosuvastatin, cyclobenzaprine, levocetirizine, losartan, promethazine, metFORMIN, propranolol, omeprazole, fluticasone, fluconazole, zolpidem, amphetamine-dextroamphetamine, albuterol, Flovent HFA, ALPRAZolam, glimepiride, ACETAMINOPHEN-BUTALBITAL, sulfamethoxazole-trimethoprim, gabapentin, baclofen, busPIRone, amphetamine-dextroamphetamine, amphetamine-dextroamphetamine, DULoxetine, HYDROcodone-acetaminophen, ondansetron, and mupirocin cream.  Meds ordered this encounter  Medications  . mupirocin cream (BACTROBAN) 2 %    Sig: Apply via clean qtip to nares qhs    Dispense:  30 g    Refill:  1     Danise Edge, MD

## 2020-12-10 NOTE — Assessment & Plan Note (Signed)
Flared since covid and notes brain fog as well.

## 2020-12-10 NOTE — Assessment & Plan Note (Signed)
With right lower extremity pain. Gabapentin helps. Hydrocodone for severe pain. Encouraged moist heat and gentle stretching as tolerated. May try NSAIDs and prescription meds as directed and report if symptoms worsen or seek immediate care

## 2020-12-10 NOTE — Assessment & Plan Note (Signed)
Well controlled, no changes to meds. Encouraged heart healthy diet such as the DASH diet and exercise as tolerated.  °

## 2020-12-10 NOTE — Assessment & Plan Note (Signed)
hgba1c unacceptable, minimize simple carbs. Increase exercise as tolerated. Continue current meds 

## 2020-12-10 NOTE — Patient Instructions (Signed)

## 2020-12-10 NOTE — Assessment & Plan Note (Signed)
Encouraged heart healthy diet, increase exercise, avoid trans fats, consider a krill oil cap daily 

## 2020-12-10 NOTE — Assessment & Plan Note (Signed)
Carriage in nares and spread to skin and now she is feeling much better and her lesions are improving. Given refill on Mupirocin cream

## 2020-12-10 NOTE — Assessment & Plan Note (Signed)
Recurrent staph on arms and trunk, carriage in nose, refill on Mupirocin to apply to nares qhs. Continue Salicylic acid washes which seem to be helping.

## 2020-12-10 NOTE — Assessment & Plan Note (Signed)
More frequent and painful since covid. Encouraged increased hydration, 64 ounces of clear fluids daily. Minimize alcohol and caffeine. Eat small frequent meals with lean proteins and complex carbs. Avoid high and low blood sugars. Get adequate sleep, 7-8 hours a night. Needs exercise daily preferably in the morning.

## 2020-12-10 NOTE — Assessment & Plan Note (Addendum)
She had Covid recently and that flared her back pain. She is feeling some better now. Was at least her 3 rd case. She notes the inhalers are helping the congestion and SOB. Her headaches are worse and the meds help that some. Encouraged increased hydration, 64 ounces of clear fluids daily. Minimize alcohol and caffeine. Eat small frequent meals with lean proteins and complex carbs. Avoid high and low blood sugars. Get adequate sleep, 7-8 hours a night. Needs exercise daily preferably in the morning.

## 2020-12-11 ENCOUNTER — Other Ambulatory Visit: Payer: Self-pay | Admitting: Family Medicine

## 2020-12-11 DIAGNOSIS — E669 Obesity, unspecified: Secondary | ICD-10-CM

## 2020-12-11 DIAGNOSIS — E1169 Type 2 diabetes mellitus with other specified complication: Secondary | ICD-10-CM

## 2020-12-11 LAB — LIPID PANEL
Cholesterol: 179 mg/dL (ref 0–200)
HDL: 75.9 mg/dL (ref >39.00)
LDL Cholesterol: 76 mg/dL (ref 0–99)
NonHDL: 102.92
Total CHOL/HDL Ratio: 2
Triglycerides: 134 mg/dL (ref 0.0–149.0)
VLDL: 26.8 mg/dL (ref 0.0–40.0)

## 2020-12-11 LAB — COMPREHENSIVE METABOLIC PANEL
ALT: 18 U/L (ref 0–35)
AST: 13 U/L (ref 0–37)
Albumin: 4.4 g/dL (ref 3.5–5.2)
Alkaline Phosphatase: 139 U/L — ABNORMAL HIGH (ref 39–117)
BUN: 10 mg/dL (ref 6–23)
CO2: 26 mEq/L (ref 19–32)
Calcium: 10.2 mg/dL (ref 8.4–10.5)
Chloride: 100 mEq/L (ref 96–112)
Creatinine, Ser: 0.68 mg/dL (ref 0.40–1.20)
GFR: 104.36 mL/min (ref >60.00)
Glucose, Bld: 161 mg/dL — ABNORMAL HIGH (ref 70–99)
Potassium: 3.7 mEq/L (ref 3.5–5.1)
Sodium: 137 mEq/L (ref 135–145)
Total Bilirubin: 0.3 mg/dL (ref 0.2–1.2)
Total Protein: 7.5 g/dL (ref 6.0–8.3)

## 2020-12-11 LAB — CBC
HCT: 36.8 % (ref 36.0–46.0)
Hemoglobin: 12 g/dL (ref 12.0–15.0)
MCHC: 32.7 g/dL (ref 30.0–36.0)
MCV: 88.9 fl (ref 78.0–100.0)
Platelets: 231 10*3/uL (ref 150.0–400.0)
RBC: 4.14 Mil/uL (ref 3.87–5.11)
RDW: 15.2 % (ref 11.5–15.5)
WBC: 6 10*3/uL (ref 4.0–10.5)

## 2020-12-11 LAB — TSH: TSH: 0.82 u[IU]/mL (ref 0.35–4.50)

## 2020-12-11 LAB — HEMOGLOBIN A1C: Hgb A1c MFr Bld: 8.8 % — ABNORMAL HIGH (ref 4.6–6.5)

## 2020-12-11 NOTE — Progress Notes (Unsigned)
b

## 2020-12-12 LAB — HEPATITIS C ANTIBODY
Hepatitis C Ab: NONREACTIVE
SIGNAL TO CUT-OFF: 0.01 (ref <1.00)

## 2020-12-13 ENCOUNTER — Other Ambulatory Visit: Payer: Self-pay

## 2020-12-13 ENCOUNTER — Telehealth (INDEPENDENT_AMBULATORY_CARE_PROVIDER_SITE_OTHER): Payer: 59 | Admitting: Psychiatry

## 2020-12-13 DIAGNOSIS — F411 Generalized anxiety disorder: Secondary | ICD-10-CM

## 2020-12-13 DIAGNOSIS — F330 Major depressive disorder, recurrent, mild: Secondary | ICD-10-CM

## 2020-12-13 DIAGNOSIS — F909 Attention-deficit hyperactivity disorder, unspecified type: Secondary | ICD-10-CM

## 2020-12-13 LAB — DRUG MONITORING, PANEL 8 WITH CONFIRMATION, URINE
6 Acetylmorphine: NEGATIVE ng/mL (ref <10)
Alcohol Metabolites: NEGATIVE ng/mL
Alphahydroxyalprazolam: 42 ng/mL — ABNORMAL HIGH (ref <25)
Alphahydroxymidazolam: NEGATIVE ng/mL (ref <50)
Alphahydroxytriazolam: NEGATIVE ng/mL (ref <50)
Aminoclonazepam: NEGATIVE ng/mL (ref <25)
Amphetamine: 3084 ng/mL — ABNORMAL HIGH (ref <250)
Amphetamines: POSITIVE ng/mL — AB (ref <500)
Benzodiazepines: POSITIVE ng/mL — AB (ref <100)
Buprenorphine, Urine: NEGATIVE ng/mL (ref <5)
Cocaine Metabolite: NEGATIVE ng/mL (ref <150)
Codeine: NEGATIVE ng/mL (ref <50)
Creatinine: 37.5 mg/dL
Hydrocodone: 99 ng/mL — ABNORMAL HIGH (ref <50)
Hydromorphone: NEGATIVE ng/mL (ref <50)
Hydroxyethylflurazepam: NEGATIVE ng/mL (ref <50)
Lorazepam: NEGATIVE ng/mL (ref <50)
MDMA: NEGATIVE ng/mL (ref <500)
Marijuana Metabolite: NEGATIVE ng/mL (ref <20)
Methamphetamine: NEGATIVE ng/mL (ref <250)
Morphine: NEGATIVE ng/mL (ref <50)
Nordiazepam: NEGATIVE ng/mL (ref <50)
Norhydrocodone: 377 ng/mL — ABNORMAL HIGH (ref <50)
Opiates: POSITIVE ng/mL — AB (ref <100)
Oxazepam: NEGATIVE ng/mL (ref <50)
Oxidant: NEGATIVE ug/mL
Oxycodone: NEGATIVE ng/mL (ref <100)
Temazepam: NEGATIVE ng/mL (ref <50)
pH: 7.2 (ref 4.5–9.0)

## 2020-12-13 LAB — DM TEMPLATE

## 2020-12-13 NOTE — Progress Notes (Signed)
Patient was no show and did not respond to phone call. To be rescheduled.   Maryclare Labrador, MD

## 2020-12-17 ENCOUNTER — Encounter (HOSPITAL_COMMUNITY): Payer: Self-pay | Admitting: Psychiatry

## 2020-12-17 ENCOUNTER — Telehealth (INDEPENDENT_AMBULATORY_CARE_PROVIDER_SITE_OTHER): Payer: 59 | Admitting: Psychiatry

## 2020-12-17 ENCOUNTER — Other Ambulatory Visit: Payer: Self-pay

## 2020-12-17 DIAGNOSIS — F411 Generalized anxiety disorder: Secondary | ICD-10-CM | POA: Diagnosis not present

## 2020-12-17 DIAGNOSIS — F909 Attention-deficit hyperactivity disorder, unspecified type: Secondary | ICD-10-CM

## 2020-12-17 DIAGNOSIS — F330 Major depressive disorder, recurrent, mild: Secondary | ICD-10-CM | POA: Diagnosis not present

## 2020-12-17 MED ORDER — DULOXETINE HCL 60 MG PO CPEP: 60.0000 mg | ORAL_CAPSULE | Freq: Every day | ORAL | 2 refills | Status: DC

## 2020-12-17 MED ORDER — ALPRAZOLAM 0.5 MG PO TABS: 0.5000 mg | ORAL_TABLET | Freq: Two times a day (BID) | ORAL | 0 refills | Status: DC | PRN

## 2020-12-17 NOTE — Progress Notes (Signed)
BH MD/PA/NP OP Progress Note  12/17/2020 2:15 PM Nicole Ball  MRN:  [id number] Interview was conducted by phone and I verified that I was speaking with the correct person using two identifiers. I discussed the limitations of evaluation and management by telemedicine and  the availability of in person appointments. Patient expressed understanding and agreed to proceed. Participants in the visit: patient (location - home); physician (location - home office).  Chief Complaint: situational mood issues  HPI: Patient is a 45yo married AAFwith along standinghx ofdepression and generalized anxiety. She was previously a patient of Dr.Pucilowska, who is no longer with [facility].She reports some situational mood issues, she just started the cymbalta at 30mg  just 7 days ago, her mood seems a little better. She would like to go up on the dose to 60mg   Denies any side effects. All other medications are helpful and she is doing well. She used to have frequent panic attacks andis prescribed alprazolam1mg  bid prn, she uses occasionally. Takes 1mg  once or twice a week.  She is on buspirone for depression/anxiety. Sleeps well mostly with the help of ambien. Appetite is ok. Denies any suicidal thoughts.   Per Dr.Pucilowska, " In thepast she tried citalopram and venlafaxine but could not tolerate excessive sweating and other adverse effects which now, in perspective, she thinks might have been more related to her menopausal symptoms. 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 She has never been formally diagnosed with ADD.We added Adderall XRand gradually arrived at 60 mg daily as the dose which provides optimal benefit: improvementinfocusing ability, less fatigue. Anxiety however still is present and she continues to use  alprazolam daily (less need for zolpidem as sleep has improved). This is mostly work-stress related.   Visit Diagnosis:    ICD-10-CM   1. GAD (generalized anxiety disorder)  F41.1   2. Major depressive disorder, recurrent, mild (HCC)  F33.0   3. Adult ADHD  F90.9     Past Psychiatric History: Please see intake H&P.  Past Medical History:  Past Medical History:  Diagnosis Date  . Abnormal serum level of alkaline phosphatase 05/21/2014  . Acute low back pain 12/19/2010  . ALLERGIC RHINITIS 10/16/2010  . Anxiety and depression 06/07/2011  . Atrophic vaginitis 08/17/2016  . Cervical cancer screening 01/10/2014   Menarche at 8 Regular and moderate flow, became irregular until OCPs  history of abnormal pap in past, repeat was normal Last pap roughly 3 years ago G0P0, s/p No history of abnormal MGM, no h/o MGM  No concerns today no gyn surgeries LMP May 2013  . CTS (carpal tunnel syndrome) 01/15/2014  . Diabetes mellitus type 2 in obese (HCC) 10/30/2011  . Dry eyes 08/17/2016  . Early menopause   . Essential hypertension   . HIATAL HERNIA WITH REFLUX, HX OF 10/16/2010  . Hyperlipidemia 04/21/2011  . Inappropriate sinus tachycardia   . INSOMNIA 10/16/2010  . Knee pain, left 03/11/2012  . Migraine 03/07/2013  . Morbid obesity (HCC)   . Palpitations 01/28/2015  . Reflux 04/22/2012  . Sleep apnea 06/07/2011  . Tobacco use disorder 02/10/2017  . Urinary urgency 08/17/2016  . UTI'S, HX OF 10/16/2010    Past Surgical History:  Procedure Laterality Date  . CHOLECYSTECTOMY      Family Psychiatric History: Reviewed.  Family History:  Family History  Problem Relation Age  of Onset  . Lupus Mother   . Arthritis Maternal Grandmother   . Scleroderma Maternal Grandmother   . Glaucoma Maternal Grandmother   . Cataracts Maternal Grandmother   . Cancer Maternal Grandfather        prostate  . Coronary artery disease Maternal Grandfather        s/p angioplasty  . Diabetes Maternal Grandfather        Type 2   . Cancer Paternal Grandmother        Breast ca- dx in 2950's  . Thyroid disease Paternal Grandmother   . Breast cancer Paternal Grandmother   . Diabetes Paternal Grandfather   . Thyroid disease Sister   . Asthma Sister   . Allergies Brother   . Cancer Father        cigarette, alcohol HEENT squamous cell  . Alcohol abuse Father   . Arthritis Other   . Alcohol abuse Maternal Aunt   . Alcohol abuse Maternal Uncle     Social History:  Social History   Socioeconomic History  . Marital status: Married    Spouse name: Not on file  . Number of children: Not on file  . Years of education: Not on file  . Highest education level: Not on file  Occupational History  . Not on file  Tobacco Use  . Smoking status: Current Some Day Smoker    Packs/day: 0.10    Types: Cigars  . Smokeless tobacco: Never Used  . Tobacco comment: 1 black and milds  Vaping Use  . Vaping Use: Never used  Substance and Sexual Activity  . Alcohol use: Yes    Comment: twice/year  . Drug use: No  . Sexual activity: Not on file  Other Topics Concern  . Not on file  Social History Narrative   Married lives w/ husband, works at [employer]   Social Determinants of Health   Financial Resource Strain: Not on file  Food Insecurity: Not on file  Transportation Needs: Not on file  Physical Activity: Not on file  Stress: Not on file  Social Connections: Not on file    Allergies: No Known Allergies  Metabolic Disorder Labs: Lab Results  Component Value Date   HGBA1C 8.8 (H) 12/10/2020   MPG 140 (H) 05/19/2014   MPG 148 (H) 10/14/2013   No results found for: PROLACTIN Lab Results  Component Value Date   CHOL 179 12/10/2020   TRIG 134.0 12/10/2020   HDL 75.90 12/10/2020   CHOLHDL 2 12/10/2020   VLDL 26.8 12/10/2020   LDLCALC 76 12/10/2020   LDLCALC 86 09/13/2020   Lab Results  Component Value Date   TSH 0.82 12/10/2020   TSH 1.81 09/13/2020    Therapeutic Level Labs: No results found  for: LITHIUM No results found for: VALPROATE No components found for:  CBMZ  Current Medications: Current Outpatient Medications  Medication Sig Dispense Refill  . ACETAMINOPHEN-BUTALBITAL 50-325 MG TABS TAKE 1 TABLET BY MOUTH TWICE DAILY AS NEEDED FOR HEADACHES 60 tablet 0  . albuterol (VENTOLIN HFA) 108 (90 Base) MCG/ACT inhaler Inhale 2 puffs into the lungs every 6 (six) hours as needed for wheezing or shortness of breath. 8 g 0  . ALPRAZolam (XANAX) 1 MG tablet TAKE 1 TABLET(1 MG) BY MOUTH TWICE DAILY AS NEEDED FOR ANXIETY 60 tablet 1  . amphetamine-dextroamphetamine (ADDERALL XR) 30 MG 24 hr capsule Take 2 capsules (60 mg total) by mouth every morning. 60 capsule 0  . [START ON 01/14/2021] amphetamine-dextroamphetamine (  ADDERALL XR) 30 MG 24 hr capsule Take 2 capsules (60 mg total) by mouth every morning. 60 capsule 0  . amphetamine-dextroamphetamine (ADDERALL XR) 30 MG 24 hr capsule Take 2 capsules (60 mg total) by mouth every morning. 60 capsule 0  . baclofen (LIORESAL) 10 MG tablet Take 1 tablet (10 mg total) by mouth 2 (two) times daily as needed for muscle spasms. 30 each 2  . baclofen (LIORESAL) 10 MG tablet Take 1 tablet (10 mg total) by mouth 3 (three) times daily. 30 each 0  . BLACK COHOSH PO Take 1 tablet by mouth daily.    . busPIRone (BUSPAR) 15 MG tablet Take 1 tablet (15 mg total) by mouth 2 (two) times daily. 60 tablet 2  . celecoxib (CELEBREX) 200 MG capsule One to 2 tablets by mouth daily as needed for pain. 60 capsule 2  . conjugated estrogens (PREMARIN) vaginal cream Apply small amount to affected area twice a week 42.5 g 1  . cyclobenzaprine (FLEXERIL) 10 MG tablet Take 1 tablet (10 mg total) by mouth 2 (two) times daily as needed for muscle spasms. 60 tablet 2  . DULoxetine (CYMBALTA) 30 MG capsule Take 1 capsule (30 mg total) by mouth daily for 10 days, THEN 2 capsules (60 mg total) daily. 130 capsule 0  . fluconazole (DIFLUCAN) 150 MG tablet Take 1 tablet (150 mg total)  by mouth once a week. 4 tablet 1  . fluticasone (FLONASE) 50 MCG/ACT nasal spray SHAKE LIQUID AND USE 2 SPRAYS IN EACH NOSTRIL DAILY 16 g 1  . fluticasone (FLOVENT HFA) 220 MCG/ACT inhaler Inhale 1 puff into the lungs in the morning and at bedtime. 1 each 1  . gabapentin (NEURONTIN) 600 MG tablet TAKE 1 TABLET(600 MG) BY MOUTH THREE TIMES DAILY 90 tablet 0  . Ginger, Zingiber officinalis, (GINGER PO) Take 1 tablet by mouth daily.    Marland Kitchen glimepiride (AMARYL) 2 MG tablet Take 1 tablet (2 mg total) by mouth in the morning and at bedtime. 60 tablet 3  . HYDROcodone-acetaminophen (NORCO) 7.5-325 MG tablet Take 1 tablet by mouth every 6 (six) hours as needed for moderate pain. 100 tablet 0  . hyoscyamine (LEVSIN SL) 0.125 MG SL tablet Take 1 tablet (0.125 mg total) by mouth every 4 (four) hours as needed. 30 tablet 0  . levocetirizine (XYZAL) 5 MG tablet TAKE 1 TABLET(5 MG) BY MOUTH EVERY EVENING 30 tablet 5  . losartan (COZAAR) 50 MG tablet TAKE 1 TABLET(50 MG) BY MOUTH DAILY 90 tablet 2  . metFORMIN (GLUCOPHAGE) 500 MG tablet TAKE 2 TABLETS BY MOUTH TWICE DAILY 360 tablet 3  . montelukast (SINGULAIR) 10 MG tablet TAKE 1 TABLET(10 MG) BY MOUTH AT BEDTIME 90 tablet 1  . mupirocin cream (BACTROBAN) 2 % Apply via clean qtip to nares qhs 30 g 1  . omeprazole (PRILOSEC) 20 MG capsule TAKE 1 CAPSULE(20 MG) BY MOUTH TWICE DAILY BEFORE A MEAL 180 capsule 3  . ondansetron (ZOFRAN) 4 MG tablet TAKE 1 TABLET(4 MG) BY MOUTH EVERY 8 HOURS AS NEEDED FOR NAUSEA OR VOMITING 40 tablet 1  . Probiotic Product (PROBIOTIC PO) Take 1 tablet by mouth daily.    . promethazine (PHENERGAN) 25 MG tablet Take 1 tablet (25 mg total) by mouth every 8 (eight) hours as needed for nausea or vomiting. 30 tablet 1  . propranolol (INDERAL) 60 MG tablet TAKE 1 TABLET(60 MG) BY MOUTH TWICE DAILY 180 tablet 2  . rosuvastatin (CRESTOR) 5 MG tablet Take 1 tablet (  5 mg total) by mouth 3 (three) times a week. 36 tablet 3  . sodium chloride  (OCEAN) 0.65 % SOLN nasal spray Place 1 spray into both nostrils daily.    Marland Kitchen sulfamethoxazole-trimethoprim (BACTRIM DS) 800-160 MG tablet Take 1 tablet by mouth 2 (two) times daily. 20 tablet 0  . SUMAtriptan (IMITREX) 50 MG tablet Take 1 tablet (50 mg total) by mouth every 2 (two) hours as needed for migraine. May repeat in 2 hours if headache persists or recurs. 10 tablet 3  . zolpidem (AMBIEN) 10 MG tablet Take 1 tablet (10 mg total) by mouth at bedtime as needed for sleep. 30 tablet 2   No current facility-administered medications for this visit.      Psychiatric Specialty Exam: Review of Systems  Psychiatric/Behavioral: The patient is nervous/anxious.   All other systems reviewed and are negative.   Last menstrual period 01/08/2013.There is no height or weight on file to calculate BMI.  General Appearance: NA  Eye Contact:  NA  Speech:  Clear and Coherent and Normal Rate  Volume:  Normal  Mood:  Situational mood issues  Affect:  NA  Thought Process:  Goal Directed and Linear  Orientation:  Full (Time, Place, and Person)  Thought Content: Rumination   Suicidal Thoughts:  No  Homicidal Thoughts:  No  Memory:  Immediate;   Good Recent;   Good Remote;   Good  Judgement:  Good  Insight:  Good  Psychomotor Activity:  NA  Concentration:  Concentration: Good  Recall:  Good  Fund of Knowledge: Good  Language: Good  Akathisia:  Negative  Handed:  Right  AIMS (if indicated): not done  Assets:  Communication Skills Desire for Improvement Financial Resources/Insurance Housing Social Support Vocational/Educational  ADL's:  Intact  Cognition: WNL  Sleep:  Fair   Screenings: Flowsheet Row ED from 09/13/2020 in [facility]  C-SSRS RISK CATEGORY No Risk       Assessment and Plan: 44yo married AAFwith along standinghx ofdepression (on and off) and generalized anxiety. She used to have frequent panic attacks andis prescribed alprazolam1mg   bid prn these. She is on buspirone and fluoxetine combination for depression/anxiety. In thepast she tried citalopram and venlafaxine but could not tolerate excessive sweating and other adverse effects which now, in perspective, she thinks might have been more related to her menopausal symptoms. 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 She has never been formally diagnosed with ADD.We added Adderall XRand gradually arrived at 60 mg daily as the dose which provides optimal benefit: improvementinfocusing ability, less fatigue. Anxiety however still is present and she continues to use alprazolam daily (less need for zolpidem as sleep has improved). This is mostly work-stress related.  Dx: GAD; Panic disorder; ADD adult type; MDD recurrent, mild  Plan: Continue Buspar 15 mg bid, zolpidem 10 mg prn sleep,andAdderallXRat60mg  daily.Increase duloxetine to 60 mg daily.  Change alprazolam to 0.5mg  bid prn anxiety. Discussed with patient the risks of polypharmacy with the hydrocodone, gabapentin, xanax, baclofen and the ambien and increased risk of sedation. Patient aware of these interactions. She reports she does not take these medications together and takes them only as needed.  Next appointment inonemonth.The plan was discussed with patient who had an opportunity to ask questions and these were all answered. I spend14minutes inphone consultationwith the patient. I spent  20 minutes on chart review.    Patrick North, MD 12/17/2020, 2:15 PM

## 2020-12-20 ENCOUNTER — Telehealth: Payer: Self-pay | Admitting: Family Medicine

## 2020-12-20 NOTE — Telephone Encounter (Signed)
Spoke with patient to let her know medication was too early but will still send to Dr. Abner Greenspan.  She stated that she advised Dr. Abner Greenspan at her her last visit that she will be running out early.

## 2020-12-20 NOTE — Telephone Encounter (Signed)
Requesting: hydrocodone Contract:  UDS: 12/10/20 Last Visit: 12/10/20  Next Visit: 03/28/21 Last Refill:  11/26/20  Please Advise

## 2020-12-20 NOTE — Telephone Encounter (Signed)
Patient called and wanted it doucmented that is not feeling or doing well. She states that since her last DX with Covid she is still very tired with low energy. Some days she is unable to get out of bed. She mentioned that she might want to send over paperwork for FMLA. I provided her with the fax number. She also mentioned that she is feeling burned out and overwhelmed. She states that she is stll having body aches as well.  Not sure if her discomfort is coming from post covid or recent Staph infetion/MRSA.. I did offer her an appt which she did decline and states she just wanted the call documented. She also needs a refill on Medication:HYDROcodone-acetaminophen (NORCO) 7.5-  Has the patient contacted their pharmacy? No. (If no, request that the patient contact the pharmacy for the refill.) (If yes, when and what did the pharmacy advise?)  Preferred Pharmacy (with phone number or street name):   Agent: Please be advised that RX refills may take up to 3 business days. We ask that you follow-up with your pharmacy.   [id number] [place], [place] - [address] AT [facility] [address], [place] [address]  Phone:  [phone] Fax:  [phone]

## 2020-12-20 NOTE — Telephone Encounter (Signed)
This refill is early; last refill was 11/26/20; patient was notified that her PCP was out of the office until Monday and would be addressed at that time.

## 2020-12-21 ENCOUNTER — Other Ambulatory Visit: Payer: Self-pay | Admitting: Medical

## 2020-12-21 ENCOUNTER — Other Ambulatory Visit: Payer: Self-pay | Admitting: Family Medicine

## 2020-12-21 MED ORDER — GABAPENTIN 600 MG PO TABS: 600.0000 mg | ORAL_TABLET | Freq: Three times a day (TID) | ORAL | 0 refills | Status: DC

## 2020-12-21 MED ORDER — BACLOFEN 10 MG PO TABS: 10.0000 mg | ORAL_TABLET | Freq: Three times a day (TID) | ORAL | 0 refills | Status: DC

## 2020-12-21 NOTE — Telephone Encounter (Signed)
Last OV--12/10/20 Next Scheduled OV--03/28/21 Last RF--#100 on 11/26/2020 UDS last completed on 12/10/20 CSC last signed on 08/25/2016

## 2020-12-21 NOTE — Telephone Encounter (Signed)
PCP did not fill at last refill Advise please.

## 2020-12-21 NOTE — Telephone Encounter (Signed)
We told her no on this refill yesterday- she is asking too early; will have to let Dr. Abner Greenspan address on Monday.

## 2020-12-23 ENCOUNTER — Other Ambulatory Visit: Payer: Self-pay | Admitting: Family Medicine

## 2020-12-23 MED ORDER — HYDROCODONE-ACETAMINOPHEN 7.5-325 MG PO TABS: 1.0000 | ORAL_TABLET | Freq: Four times a day (QID) | ORAL | 0 refills | Status: DC | PRN

## 2020-12-23 NOTE — Telephone Encounter (Signed)
done

## 2020-12-27 ENCOUNTER — Other Ambulatory Visit: Payer: Self-pay | Admitting: Family Medicine

## 2020-12-28 ENCOUNTER — Encounter: Payer: Self-pay | Admitting: Family Medicine

## 2020-12-31 NOTE — Telephone Encounter (Signed)
Patient will like Wed.  What time should I put her in for.  She will fax over a better copy of paperwork tomorrow.

## 2021-01-01 ENCOUNTER — Ambulatory Visit: Payer: 59 | Admitting: Internal Medicine

## 2021-01-01 NOTE — Telephone Encounter (Signed)
Spoke with pt and advised appt tomorrow and she should be faxing over paperwork before 5pm

## 2021-01-01 NOTE — Progress Notes (Deleted)
Name: Nicole Ball  MRN/ DOB: [id number], Aug 09, 1976   Age/ Sex: 45 y.o., female    PCP: [name], MD   Reason for Endocrinology Evaluation: Type 2 Diabetes Mellitus     Date of Initial Endocrinology Visit: 01/01/2021     PATIENT IDENTIFIER: Nicole Ball is a 45 y.o. female with a past medical history of T2DM and HTN. The patient presented for initial endocrinology clinic visit on 01/01/2021 for consultative assistance with her diabetes management.    HPI: Nicole Ball was    Diagnosed with DM *** Prior Medications tried/Intolerance: *** Currently checking blood sugars *** x / day,  before breakfast and ***.  Hypoglycemia episodes : ***               Symptoms: ***                 Frequency: ***/  Hemoglobin A1c has ranged from 6.9% in 2020, peaking at 10.0% in 2022. Patient required assistance for hypoglycemia:  Patient has required hospitalization within the last 1 year from hyper or hypoglycemia:   In terms of diet, the patient ***   HOME DIABETES REGIMEN: Metformin 500 mg  Glimepiride 2 mg    Statin: Yes ACE-I/ARB: {YES/NO:17245} Prior Diabetic Education: {Yes/No:11203}   METER DOWNLOAD SUMMARY: Date range evaluated: *** Fingerstick Blood Glucose Tests = *** Average Number Tests/Day = *** Overall Mean FS Glucose = *** Standard Deviation = ***  BG Ranges: Low = *** High = ***   Hypoglycemic Events/30 Days: BG < 50 = *** Episodes of symptomatic severe hypoglycemia = ***   DIABETIC COMPLICATIONS: Microvascular complications:   ***  Denies: CKD   Last eye exam: Completed   Macrovascular complications:   ***  Denies: CAD, PVD, CVA   PAST HISTORY: Past Medical History:  Past Medical History:  Diagnosis Date  . Abnormal serum level of alkaline phosphatase 05/21/2014  . Acute low back pain 12/19/2010  . ALLERGIC RHINITIS 10/16/2010  . Anxiety and depression 06/07/2011  . Atrophic vaginitis 08/17/2016  . Cervical cancer screening  01/10/2014   Menarche at 8 Regular and moderate flow, became irregular until OCPs  history of abnormal pap in past, repeat was normal Last pap roughly 3 years ago G0P0, s/p No history of abnormal MGM, no h/o MGM  No concerns today no gyn surgeries LMP May 2013  . CTS (carpal tunnel syndrome) 01/15/2014  . Diabetes mellitus type 2 in obese (HCC) 10/30/2011  . Dry eyes 08/17/2016  . Early menopause   . Essential hypertension   . HIATAL HERNIA WITH REFLUX, HX OF 10/16/2010  . Hyperlipidemia 04/21/2011  . Inappropriate sinus tachycardia   . INSOMNIA 10/16/2010  . Knee pain, left 03/11/2012  . Migraine 03/07/2013  . Morbid obesity (HCC)   . Palpitations 01/28/2015  . Reflux 04/22/2012  . Sleep apnea 06/07/2011  . Tobacco use disorder 02/10/2017  . Urinary urgency 08/17/2016  . UTI'S, HX OF 10/16/2010    Past Surgical History:  Past Surgical History:  Procedure Laterality Date  . CHOLECYSTECTOMY        Social History:  reports that she has been smoking cigars. She has been smoking about 0.10 packs per day. She has never used smokeless tobacco. She reports current alcohol use. She reports that she does not use drugs. Family History:  Family History  Problem Relation Age of Onset  . Lupus Mother   . Arthritis Maternal Grandmother   . Scleroderma Maternal Grandmother   .  Glaucoma Maternal Grandmother   . Cataracts Maternal Grandmother   . Cancer Maternal Grandfather        prostate  . Coronary artery disease Maternal Grandfather        s/p angioplasty  . Diabetes Maternal Grandfather        Type 2  . Cancer Paternal Grandmother        Breast ca- dx in 58's  . Thyroid disease Paternal Grandmother   . Breast cancer Paternal Grandmother   . Diabetes Paternal Grandfather   . Thyroid disease Sister   . Asthma Sister   . Allergies Brother   . Cancer Father        cigarette, alcohol HEENT squamous cell  . Alcohol abuse Father   . Arthritis Other   . Alcohol abuse Maternal Aunt   . Alcohol abuse  Maternal Uncle       HOME MEDICATIONS: Allergies as of 01/01/2021   No Known Allergies     Medication List       Accurate as of Jan 01, 2021 12:55 PM. If you have any questions, ask your nurse or doctor.        ACETAMINOPHEN-BUTALBITAL 50-325 MG Tabs TAKE 1 TABLET BY MOUTH TWICE DAILY AS NEEDED FOR HEADACHES   albuterol 108 (90 Base) MCG/ACT inhaler Commonly known as: VENTOLIN HFA Inhale 2 puffs into the lungs every 6 (six) hours as needed for wheezing or shortness of breath.   ALPRAZolam 0.5 MG tablet Commonly known as: Xanax Take 1 tablet (0.5 mg total) by mouth 2 (two) times daily as needed for anxiety or sleep.   amphetamine-dextroamphetamine 30 MG 24 hr capsule Commonly known as: Adderall XR Take 2 capsules (60 mg total) by mouth every morning.   amphetamine-dextroamphetamine 30 MG 24 hr capsule Commonly known as: Adderall XR Take 2 capsules (60 mg total) by mouth every morning.   amphetamine-dextroamphetamine 30 MG 24 hr capsule Commonly known as: Adderall XR Take 2 capsules (60 mg total) by mouth every morning. Start taking on: January 14, 2021   baclofen 10 MG tablet Commonly known as: LIORESAL Take 1 tablet (10 mg total) by mouth 2 (two) times daily as needed for muscle spasms.   baclofen 10 MG tablet Commonly known as: LIORESAL Take 1 tablet (10 mg total) by mouth 3 (three) times daily.   BLACK COHOSH PO Take 1 tablet by mouth daily.   busPIRone 15 MG tablet Commonly known as: BUSPAR Take 1 tablet (15 mg total) by mouth 2 (two) times daily.   celecoxib 200 MG capsule Commonly known as: CeleBREX One to 2 tablets by mouth daily as needed for pain.   cyclobenzaprine 10 MG tablet Commonly known as: Flexeril Take 1 tablet (10 mg total) by mouth 2 (two) times daily as needed for muscle spasms.   DULoxetine 60 MG capsule Commonly known as: Cymbalta Take 1 capsule (60 mg total) by mouth daily.   Flovent HFA 220 MCG/ACT inhaler Generic drug:  fluticasone Inhale 1 puff into the lungs in the morning and at bedtime.   fluconazole 150 MG tablet Commonly known as: DIFLUCAN Take 1 tablet (150 mg total) by mouth once a week.   fluticasone 50 MCG/ACT nasal spray Commonly known as: FLONASE SHAKE LIQUID AND USE 2 SPRAYS IN EACH NOSTRIL DAILY   gabapentin 600 MG tablet Commonly known as: NEURONTIN Take 1 tablet (600 mg total) by mouth 3 (three) times daily.   GINGER PO Take 1 tablet by mouth daily.   glimepiride 2  MG tablet Commonly known as: AMARYL Take 1 tablet (2 mg total) by mouth in the morning and at bedtime.   HYDROcodone-acetaminophen 7.5-325 MG tablet Commonly known as: Norco Take 1 tablet by mouth every 6 (six) hours as needed for moderate pain.   hyoscyamine 0.125 MG SL tablet Commonly known as: LEVSIN SL Take 1 tablet (0.125 mg total) by mouth every 4 (four) hours as needed.   levocetirizine 5 MG tablet Commonly known as: XYZAL TAKE 1 TABLET(5 MG) BY MOUTH EVERY EVENING   losartan 50 MG tablet Commonly known as: COZAAR TAKE 1 TABLET(50 MG) BY MOUTH DAILY   metFORMIN 500 MG tablet Commonly known as: GLUCOPHAGE TAKE 2 TABLETS BY MOUTH TWICE DAILY   montelukast 10 MG tablet Commonly known as: SINGULAIR TAKE 1 TABLET(10 MG) BY MOUTH AT BEDTIME   mupirocin cream 2 % Commonly known as: Bactroban Apply via clean qtip to nares qhs   omeprazole 20 MG capsule Commonly known as: PRILOSEC TAKE 1 CAPSULE(20 MG) BY MOUTH TWICE DAILY BEFORE A MEAL   ondansetron 4 MG tablet Commonly known as: ZOFRAN TAKE 1 TABLET(4 MG) BY MOUTH EVERY 8 HOURS AS NEEDED FOR NAUSEA OR VOMITING   Premarin vaginal cream Generic drug: conjugated estrogens Apply small amount to affected area twice a week   PROBIOTIC PO Take 1 tablet by mouth daily.   promethazine 25 MG tablet Commonly known as: PHENERGAN Take 1 tablet (25 mg total) by mouth every 8 (eight) hours as needed for nausea or vomiting.   propranolol 60 MG  tablet Commonly known as: INDERAL TAKE 1 TABLET(60 MG) BY MOUTH TWICE DAILY   rosuvastatin 5 MG tablet Commonly known as: CRESTOR Take 1 tablet (5 mg total) by mouth 3 (three) times a week.   sodium chloride 0.65 % Soln nasal spray Commonly known as: OCEAN Place 1 spray into both nostrils daily.   sulfamethoxazole-trimethoprim 800-160 MG tablet Commonly known as: BACTRIM DS Take 1 tablet by mouth 2 (two) times daily.   SUMAtriptan 50 MG tablet Commonly known as: IMITREX TAKE 1 TABLET BY MOUTH EVERY 2 HOURS AS NEEDED FOR MIGRAINE. MAY REPEAT IN 2 HOURS IF HEADACHE PERSISTS OR RECURS   zolpidem 10 MG tablet Commonly known as: AMBIEN Take 1 tablet (10 mg total) by mouth at bedtime as needed for sleep.        ALLERGIES: No Known Allergies   REVIEW OF SYSTEMS: A comprehensive ROS was conducted with the patient and is negative except as per HPI and below:  ROS    OBJECTIVE:   VITAL SIGNS: LMP 01/08/2013    PHYSICAL EXAM:  General: Pt appears well and is in NAD  Hydration: Well-hydrated with moist mucous membranes and good skin turgor  HEENT: Head: Unremarkable with good dentition. Oropharynx clear without exudate.  Eyes: External eye exam normal without stare, lid lag or exophthalmos.  EOM intact.  PERRL.  Neck: General: Supple without adenopathy or carotid bruits. Thyroid: Thyroid size normal.  No goiter or nodules appreciated. No thyroid bruit.  Lungs: Clear with good BS bilat with no rales, rhonchi, or wheezes  Heart: RRR with normal S1 and S2 and no gallops; no murmurs; no rub  Abdomen: Normoactive bowel sounds, soft, nontender, without masses or organomegaly palpable  Extremities:  Lower extremities - No pretibial edema. No lesions.  Skin: Normal texture and temperature to palpation. No rash noted. No Acanthosis nigricans/skin tags. No lipohypertrophy.  Neuro: MS is good with appropriate affect, pt is alert and Ox3  DM foot exam:    DATA REVIEWED:  Lab  Results  Component Value Date   HGBA1C 8.8 (H) 12/10/2020   HGBA1C 10.0 (H) 09/13/2020   HGBA1C 6.9 (H) 10/14/2018   Lab Results  Component Value Date   MICROALBUR <0.7 09/17/2015   LDLCALC 76 12/10/2020   CREATININE 0.68 12/10/2020   Lab Results  Component Value Date   MICRALBCREAT 0.9 09/17/2015    Lab Results  Component Value Date   CHOL 179 12/10/2020   HDL 75.90 12/10/2020   LDLCALC 76 12/10/2020   TRIG 134.0 12/10/2020   CHOLHDL 2 12/10/2020        ASSESSMENT / PLAN / RECOMMENDATIONS:   1) Type *** Diabetes Mellitus, ***controlled, With*** complications - Most recent A1c of *** %. Goal A1c < *** %.  ***  Plan: GENERAL:  ***  MEDICATIONS:  ***  EDUCATION / INSTRUCTIONS:  BG monitoring instructions: Patient is instructed to check her blood sugars *** times a day, ***.  Call [facility] if: BG persistently < 70 or > 300. . I reviewed the Rule of 15 for the treatment of hypoglycemia in detail with the patient. Literature supplied.   2) Diabetic complications:   Eye: Does *** have known diabetic retinopathy.   Neuro/ Feet: Does *** have known diabetic peripheral neuropathy.  Renal: Patient does *** have known baseline CKD. She is *** on an ACEI/ARB at present.Check urine albumin/creatinine ratio yearly starting at time of diagnosis. If albuminuria is positive, treatment is geared toward better glucose, blood pressure control and use of ACE inhibitors or ARBs. Monitor electrolytes and creatinine once to twice yearly.   3) Lipids: Patient is *** on a statin.    4) Hypertension: ***  at goal of < 140/90 mmHg.       Signed electronically by: Lyndle Herrlich, MD  [facility]  [facility] [address] [place], [place] [address] Phone: [phone] FAX: [phone]   CC: [name], MD [address] [place] [address] Phone: [phone]  Fax:  [phone]    Return to Endocrinology clinic as below: Future Appointments  Date Time Provider Department Center  01/01/2021  2:40 PM Deni Lefever, Konrad Dolores, MD [facility]  01/02/2021  9:00 AM [name], MD [facility]  01/17/2021  3:00 PM Patrick North, MD [facility] None  03/28/2021  2:40 PM [name], MD [facility]  06/18/2021  2:40 PM [name], MD [facility]

## 2021-01-02 ENCOUNTER — Telehealth (INDEPENDENT_AMBULATORY_CARE_PROVIDER_SITE_OTHER): Payer: 59 | Admitting: Family Medicine

## 2021-01-02 ENCOUNTER — Other Ambulatory Visit: Payer: Self-pay

## 2021-01-02 DIAGNOSIS — R519 Headache, unspecified: Secondary | ICD-10-CM

## 2021-01-02 DIAGNOSIS — E1169 Type 2 diabetes mellitus with other specified complication: Secondary | ICD-10-CM | POA: Diagnosis not present

## 2021-01-02 DIAGNOSIS — U099 Post covid-19 condition, unspecified: Secondary | ICD-10-CM | POA: Diagnosis not present

## 2021-01-02 DIAGNOSIS — B958 Unspecified staphylococcus as the cause of diseases classified elsewhere: Secondary | ICD-10-CM

## 2021-01-02 DIAGNOSIS — M5441 Lumbago with sciatica, right side: Secondary | ICD-10-CM

## 2021-01-02 DIAGNOSIS — I10 Essential (primary) hypertension: Secondary | ICD-10-CM

## 2021-01-02 DIAGNOSIS — F330 Major depressive disorder, recurrent, mild: Secondary | ICD-10-CM

## 2021-01-02 DIAGNOSIS — T7840XD Allergy, unspecified, subsequent encounter: Secondary | ICD-10-CM

## 2021-01-02 DIAGNOSIS — E669 Obesity, unspecified: Secondary | ICD-10-CM

## 2021-01-02 NOTE — Assessment & Plan Note (Signed)
Well controlled, no changes to meds. Encouraged heart healthy diet such as the DASH diet and exercise as tolerated.  °

## 2021-01-02 NOTE — Assessment & Plan Note (Addendum)
She notes numerous persistent symptoms including excessive fatigue, worsening headaches, brain fog including some trouble with word finding, Shortness of Breath, worsening anxiety and depression. She is not able to work consistently full time. We will complete FMLA paperwork for patient to allow her to miss days as needed when she is having exacerbations and rough days. She will also need days off at intervals to attend doctor's appointments. Discussed her current symptoms and plan of care for 30 minutes.

## 2021-01-02 NOTE — Patient Instructions (Signed)
[employer] at [email or web address]  Melatonin 2-10 mg Magnesium Glycinate 400 mg at bed L tryptophan capsules  NOW has sleep formulation MINDBODYGREEN sleep formulation  Encouraged good sleep hygiene such as dark, quiet room. No blue/green glowing lights such as computer screens in bedroom. No alcohol or stimulants in evening. Cut down on caffeine as able. Regular exercise is helpful but not just prior to bed time.  Insomnia Insomnia is a sleep disorder that makes it difficult to fall asleep or stay asleep. Insomnia can cause fatigue, low energy, difficulty concentrating, mood swings, and poor performance at work or school. There are three different ways to classify insomnia:  Difficulty falling asleep.  Difficulty staying asleep.  Waking up too early in the morning. Any type of insomnia can be long-term (chronic) or short-term (acute). Both are common. Short-term insomnia usually lasts for three months or less. Chronic insomnia occurs at least three times a week for longer than three months. What are the causes? Insomnia may be caused by another condition, situation, or substance, such as:  Anxiety.  Certain medicines.  Gastroesophageal reflux disease (GERD) or other gastrointestinal conditions.  Asthma or other breathing conditions.  Restless legs syndrome, sleep apnea, or other sleep disorders.  Chronic pain.  Menopause.  Stroke.  Abuse of alcohol, tobacco, or illegal drugs.  Mental health conditions, such as depression.  Caffeine.  Neurological disorders, such as Alzheimer's disease.  An overactive thyroid (hyperthyroidism). Sometimes, the cause of insomnia may not be known. What increases the risk? Risk factors for insomnia include:  Gender. Women are affected more often than men.  Age. Insomnia is more common as you get older.  Stress.  Lack of exercise.  Irregular work schedule or working night shifts.  Traveling between different time  zones.  Certain medical and mental health conditions. What are the signs or symptoms? If you have insomnia, the main symptom is having trouble falling asleep or having trouble staying asleep. This may lead to other symptoms, such as:  Feeling fatigued or having low energy.  Feeling nervous about going to sleep.  Not feeling rested in the morning.  Having trouble concentrating.  Feeling irritable, anxious, or depressed. How is this diagnosed? This condition may be diagnosed based on:  Your symptoms and medical history. Your health care provider may ask about: ? Your sleep habits. ? Any medical conditions you have. ? Your mental health.  A physical exam. How is this treated? Treatment for insomnia depends on the cause. Treatment may focus on treating an underlying condition that is causing insomnia. Treatment may also include:  Medicines to help you sleep.  Counseling or therapy.  Lifestyle adjustments to help you sleep better. Follow these instructions at home: Eating and drinking  Limit or avoid alcohol, caffeinated beverages, and cigarettes, especially close to bedtime. These can disrupt your sleep.  Do not eat a large meal or eat spicy foods right before bedtime. This can lead to digestive discomfort that can make it hard for you to sleep.   Sleep habits  Keep a sleep diary to help you and your health care provider figure out what could be causing your insomnia. Write down: ? When you sleep. ? When you wake up during the night. ? How well you sleep. ? How rested you feel the next day. ? Any side effects of medicines you are taking. ? What you eat and drink.  Make your bedroom a dark, comfortable place where it is easy to fall asleep. ? Put up shades  or blackout curtains to block light from outside. ? Use a white noise machine to block noise. ? Keep the temperature cool.  Limit screen use before bedtime. This includes: ? Watching TV. ? Using your smartphone,  tablet, or computer.  Stick to a routine that includes going to bed and waking up at the same times every day and night. This can help you fall asleep faster. Consider making a quiet activity, such as reading, part of your nighttime routine.  Try to avoid taking naps during the day so that you sleep better at night.  Get out of bed if you are still awake after 15 minutes of trying to sleep. Keep the lights down, but try reading or doing a quiet activity. When you feel sleepy, go back to bed.   General instructions  Take over-the-counter and prescription medicines only as told by your health care provider.  Exercise regularly, as told by your health care provider. Avoid exercise starting several hours before bedtime.  Use relaxation techniques to manage stress. Ask your health care provider to suggest some techniques that may work well for you. These may include: ? Breathing exercises. ? Routines to release muscle tension. ? Visualizing peaceful scenes.  Make sure that you drive carefully. Avoid driving if you feel very sleepy.  Keep all follow-up visits as told by your health care provider. This is important. Contact a health care provider if:  You are tired throughout the day.  You have trouble in your daily routine due to sleepiness.  You continue to have sleep problems, or your sleep problems get worse. Get help right away if:  You have serious thoughts about hurting yourself or someone else. If you ever feel like you may hurt yourself or others, or have thoughts about taking your own life, get help right away. You can go to your nearest emergency department or call:  Your local emergency services (911 in the [place]  A suicide crisis helpline, such as the National Suicide Prevention Lifeline at 802 477 3730. This is open 24 hours a day. Summary  Insomnia is a sleep disorder that makes it difficult to fall asleep or stay asleep.  Insomnia can be long-term (chronic) or  short-term (acute).  Treatment for insomnia depends on the cause. Treatment may focus on treating an underlying condition that is causing insomnia.  Keep a sleep diary to help you and your health care provider figure out what could be causing your insomnia. This information is not intended to replace advice given to you by your health care provider. Make sure you discuss any questions you have with your health care provider. Document Revised: 06/07/2020 Document Reviewed: 06/07/2020 Elsevier Patient Education  2021 [employer].

## 2021-01-02 NOTE — Assessment & Plan Note (Signed)
Increased frequency and intensity of headaches since her COVID infection. Maintain increased hydration, 64 ounces of clear fluids daily. Minimize alcohol and caffeine. Eat small frequent meals with lean proteins and complex carbs. Avoid high and low blood sugars. Get adequate sleep, 7-8 hours a night. Needs exercise daily preferably in the morning.

## 2021-01-02 NOTE — Assessment & Plan Note (Signed)
As the COVID and staph infections have caused long term disability she has struggled more with depression, she is following with a new psychiatrist, Dr Daleen Bo and is going to contact her as she does not like the way it makes her feel. Especially flat and if she misses a dose more agitated and uncomfortable. Has tolerated Venlafaxine in the past but it was stopped due to the hot flashes of perimenopause but she is considering restarting.

## 2021-01-02 NOTE — Assessment & Plan Note (Signed)
Encouraged moist heat and gentle stretching as tolerated. May try NSAIDs and prescription meds as directed and report if symptoms worsen or seek immediate care. Flares at times and makes it more difficult to do her job

## 2021-01-02 NOTE — Progress Notes (Signed)
MyChart Video Visit    Virtual Visit via Video Note   This visit type was conducted due to national recommendations for restrictions regarding the COVID-19 Pandemic (e.g. social distancing) in an effort to limit this patient's exposure and mitigate transmission in our community. This patient is at least at moderate risk for complications without adequate follow up. This format is felt to be most appropriate for this patient at this time. Physical exam was limited by quality of the video and audio technology used for the visit. S Chism, CMA was able to get the patient set up on a video visit.  Patient location: home Patient and provider in visit Provider location: Office  I discussed the limitations of evaluation and management by telemedicine and the availability of in person appointments. The patient expressed understanding and agreed to proceed.  Visit Date: 01/02/2021  Today's healthcare provider: Danise EdgeStacey Layton Naves, MD     Subjective:    Patient ID: Nicole Ball, female    DOB: 05/06/1976, 45 y.o.   MRN: [id number]  Chief Complaint  Patient presents with  . consult for paperwork    HPI Patient is in today for follow up on chronic medical concerns including long COVID, recurrent staph infections back pain and more. She notes numerous persistent symptoms including excessive fatigue, worsening headaches, brain fog including some trouble with word finding, Shortness of Breath, worsening anxiety and depression. She is not able to work consistently full time. She continues to struggle with back pain and more. Denies CP/fevers/GI or GU c/o. Taking meds as prescribed  Past Medical History:  Diagnosis Date  . Abnormal serum level of alkaline phosphatase 05/21/2014  . Acute low back pain 12/19/2010  . ALLERGIC RHINITIS 10/16/2010  . Anxiety and depression 06/07/2011  . Atrophic vaginitis 08/17/2016  . Cervical cancer screening 01/10/2014   Menarche at 8 Regular and moderate flow, became  irregular until OCPs  history of abnormal pap in past, repeat was normal Last pap roughly 3 years ago G0P0, s/p No history of abnormal MGM, no h/o MGM  No concerns today no gyn surgeries LMP May 2013  . CTS (carpal tunnel syndrome) 01/15/2014  . Diabetes mellitus type 2 in obese (HCC) 10/30/2011  . Dry eyes 08/17/2016  . Early menopause   . Essential hypertension   . HIATAL HERNIA WITH REFLUX, HX OF 10/16/2010  . Hyperlipidemia 04/21/2011  . Inappropriate sinus tachycardia   . INSOMNIA 10/16/2010  . Knee pain, left 03/11/2012  . Migraine 03/07/2013  . Morbid obesity (HCC)   . Palpitations 01/28/2015  . Reflux 04/22/2012  . Sleep apnea 06/07/2011  . Tobacco use disorder 02/10/2017  . Urinary urgency 08/17/2016  . UTI'S, HX OF 10/16/2010    Past Surgical History:  Procedure Laterality Date  . CHOLECYSTECTOMY      Family History  Problem Relation Age of Onset  . Lupus Mother   . Arthritis Maternal Grandmother   . Scleroderma Maternal Grandmother   . Glaucoma Maternal Grandmother   . Cataracts Maternal Grandmother   . Cancer Maternal Grandfather        prostate  . Coronary artery disease Maternal Grandfather        s/p angioplasty  . Diabetes Maternal Grandfather        Type 2  . Cancer Paternal Grandmother        Breast ca- dx in 4350's  . Thyroid disease Paternal Grandmother   . Breast cancer Paternal Grandmother   . Diabetes Paternal Grandfather   .  Thyroid disease Sister   . Asthma Sister   . Allergies Brother   . Cancer Father        cigarette, alcohol HEENT squamous cell  . Alcohol abuse Father   . Arthritis Other   . Alcohol abuse Maternal Aunt   . Alcohol abuse Maternal Uncle     Social History   Socioeconomic History  . Marital status: Married    Spouse name: Not on file  . Number of children: Not on file  . Years of education: Not on file  . Highest education level: Not on file  Occupational History  . Not on file  Tobacco Use  . Smoking status: Current Some Day  Smoker    Packs/day: 0.10    Types: Cigars  . Smokeless tobacco: Never Used  . Tobacco comment: 1 black and milds  Vaping Use  . Vaping Use: Never used  Substance and Sexual Activity  . Alcohol use: Yes    Comment: twice/year  . Drug use: No  . Sexual activity: Not on file  Other Topics Concern  . Not on file  Social History Narrative   Married lives w/ husband, works at [employer]   Social Determinants of Corporate investment banker Strain: Not on [employer] Insecurity: Not on file  Transportation Needs: Not on file  Physical Activity: Not on file  Stress: Not on file  Social Connections: Not on file  Intimate Partner Violence: Not on file    Outpatient Medications Prior to Visit  Medication Sig Dispense Refill  . ACETAMINOPHEN-BUTALBITAL 50-325 MG TABS TAKE 1 TABLET BY MOUTH TWICE DAILY AS NEEDED FOR HEADACHES 60 tablet 0  . albuterol (VENTOLIN HFA) 108 (90 Base) MCG/ACT inhaler Inhale 2 puffs into the lungs every 6 (six) hours as needed for wheezing or shortness of breath. 8 g 0  . ALPRAZolam (XANAX) 0.5 MG tablet Take 1 tablet (0.5 mg total) by mouth 2 (two) times daily as needed for anxiety or sleep. 30 tablet 0  . [START ON 01/14/2021] amphetamine-dextroamphetamine (ADDERALL XR) 30 MG 24 hr capsule Take 2 capsules (60 mg total) by mouth every morning. 60 capsule 0  . amphetamine-dextroamphetamine (ADDERALL XR) 30 MG 24 hr capsule Take 2 capsules (60 mg total) by mouth every morning. 60 capsule 0  . baclofen (LIORESAL) 10 MG tablet Take 1 tablet (10 mg total) by mouth 2 (two) times daily as needed for muscle spasms. 30 each 2  . baclofen (LIORESAL) 10 MG tablet Take 1 tablet (10 mg total) by mouth 3 (three) times daily. 30 tablet 0  . BLACK COHOSH PO Take 1 tablet by mouth daily.    . busPIRone (BUSPAR) 15 MG tablet Take 1 tablet (15 mg total) by mouth 2 (two) times daily. 60 tablet 2  . celecoxib (CELEBREX) 200 MG capsule One to 2 tablets by mouth daily as needed for  pain. 60 capsule 2  . conjugated estrogens (PREMARIN) vaginal cream Apply small amount to affected area twice a week 42.5 g 1  . cyclobenzaprine (FLEXERIL) 10 MG tablet Take 1 tablet (10 mg total) by mouth 2 (two) times daily as needed for muscle spasms. 60 tablet 2  . DULoxetine (CYMBALTA) 60 MG capsule Take 1 capsule (60 mg total) by mouth daily. 30 capsule 2  . fluconazole (DIFLUCAN) 150 MG tablet Take 1 tablet (150 mg total) by mouth once a week. 4 tablet 1  . fluticasone (FLONASE) 50 MCG/ACT nasal spray SHAKE LIQUID AND USE 2  SPRAYS IN EACH NOSTRIL DAILY 16 g 1  . fluticasone (FLOVENT HFA) 220 MCG/ACT inhaler Inhale 1 puff into the lungs in the morning and at bedtime. 1 each 1  . gabapentin (NEURONTIN) 600 MG tablet Take 1 tablet (600 mg total) by mouth 3 (three) times daily. 90 tablet 0  . Ginger, Zingiber officinalis, (GINGER PO) Take 1 tablet by mouth daily.    Marland Kitchen glimepiride (AMARYL) 2 MG tablet Take 1 tablet (2 mg total) by mouth in the morning and at bedtime. 60 tablet 3  . HYDROcodone-acetaminophen (NORCO) 7.5-325 MG tablet Take 1 tablet by mouth every 6 (six) hours as needed for moderate pain. 100 tablet 0  . hyoscyamine (LEVSIN SL) 0.125 MG SL tablet Take 1 tablet (0.125 mg total) by mouth every 4 (four) hours as needed. 30 tablet 0  . levocetirizine (XYZAL) 5 MG tablet TAKE 1 TABLET(5 MG) BY MOUTH EVERY EVENING 30 tablet 5  . losartan (COZAAR) 50 MG tablet TAKE 1 TABLET(50 MG) BY MOUTH DAILY 90 tablet 2  . metFORMIN (GLUCOPHAGE) 500 MG tablet TAKE 2 TABLETS BY MOUTH TWICE DAILY 360 tablet 3  . montelukast (SINGULAIR) 10 MG tablet TAKE 1 TABLET(10 MG) BY MOUTH AT BEDTIME 90 tablet 1  . mupirocin cream (BACTROBAN) 2 % Apply via clean qtip to nares qhs 30 g 1  . omeprazole (PRILOSEC) 20 MG capsule TAKE 1 CAPSULE(20 MG) BY MOUTH TWICE DAILY BEFORE A MEAL 180 capsule 3  . ondansetron (ZOFRAN) 4 MG tablet TAKE 1 TABLET(4 MG) BY MOUTH EVERY 8 HOURS AS NEEDED FOR NAUSEA OR VOMITING 40 tablet  1  . Probiotic Product (PROBIOTIC PO) Take 1 tablet by mouth daily.    . promethazine (PHENERGAN) 25 MG tablet Take 1 tablet (25 mg total) by mouth every 8 (eight) hours as needed for nausea or vomiting. 30 tablet 1  . propranolol (INDERAL) 60 MG tablet TAKE 1 TABLET(60 MG) BY MOUTH TWICE DAILY 180 tablet 2  . rosuvastatin (CRESTOR) 5 MG tablet Take 1 tablet (5 mg total) by mouth 3 (three) times a week. 36 tablet 3  . sodium chloride (OCEAN) 0.65 % SOLN nasal spray Place 1 spray into both nostrils daily.    Marland Kitchen sulfamethoxazole-trimethoprim (BACTRIM DS) 800-160 MG tablet Take 1 tablet by mouth 2 (two) times daily. 20 tablet 0  . SUMAtriptan (IMITREX) 50 MG tablet TAKE 1 TABLET BY MOUTH EVERY 2 HOURS AS NEEDED FOR MIGRAINE. MAY REPEAT IN 2 HOURS IF HEADACHE PERSISTS OR RECURS 10 tablet 3  . amphetamine-dextroamphetamine (ADDERALL XR) 30 MG 24 hr capsule Take 2 capsules (60 mg total) by mouth every morning. 60 capsule 0  . zolpidem (AMBIEN) 10 MG tablet Take 1 tablet (10 mg total) by mouth at bedtime as needed for sleep. 30 tablet 2   No facility-administered medications prior to visit.    No Known Allergies  Review of Systems  Constitutional: Positive for malaise/fatigue. Negative for fever.  HENT: Positive for congestion.   Eyes: Negative for blurred vision.  Respiratory: Positive for shortness of breath.   Cardiovascular: Negative for chest pain, palpitations and leg swelling.  Gastrointestinal: Negative for abdominal pain, blood in stool and nausea.  Genitourinary: Negative for dysuria and frequency.  Musculoskeletal: Positive for back pain and myalgias. Negative for falls.  Skin: Negative for rash.  Neurological: Positive for dizziness and headaches. Negative for loss of consciousness.  Endo/Heme/Allergies: Negative for environmental allergies.  Psychiatric/Behavioral: Positive for depression. The patient is nervous/anxious and has insomnia.  Objective:    Physical  Exam  LMP 01/08/2013  Wt Readings from Last 3 Encounters:  11/05/20 185 lb 9.6 oz (84.2 kg)  09/13/20 197 lb 1.5 oz (89.4 kg)  02/08/20 197 lb (89.4 kg)    Diabetic Foot Exam - Simple   No data filed    Lab Results  Component Value Date   WBC 6.0 12/10/2020   HGB 12.0 12/10/2020   HCT 36.8 12/10/2020   PLT 231.0 12/10/2020   GLUCOSE 161 (H) 12/10/2020   CHOL 179 12/10/2020   TRIG 134.0 12/10/2020   HDL 75.90 12/10/2020   LDLCALC 76 12/10/2020   ALT 18 12/10/2020   AST 13 12/10/2020   NA 137 12/10/2020   K 3.7 12/10/2020   CL 100 12/10/2020   CREATININE 0.68 12/10/2020   BUN 10 12/10/2020   CO2 26 12/10/2020   TSH 0.82 12/10/2020   HGBA1C 8.8 (H) 12/10/2020   MICROALBUR <0.7 09/17/2015    Lab Results  Component Value Date   TSH 0.82 12/10/2020   Lab Results  Component Value Date   WBC 6.0 12/10/2020   HGB 12.0 12/10/2020   HCT 36.8 12/10/2020   MCV 88.9 12/10/2020   PLT 231.0 12/10/2020   Lab Results  Component Value Date   NA 137 12/10/2020   K 3.7 12/10/2020   CO2 26 12/10/2020   GLUCOSE 161 (H) 12/10/2020   BUN 10 12/10/2020   CREATININE 0.68 12/10/2020   BILITOT 0.3 12/10/2020   ALKPHOS 139 (H) 12/10/2020   AST 13 12/10/2020   ALT 18 12/10/2020   PROT 7.5 12/10/2020   ALBUMIN 4.4 12/10/2020   CALCIUM 10.2 12/10/2020   ANIONGAP 10 09/13/2020   GFR 104.36 12/10/2020   Lab Results  Component Value Date   CHOL 179 12/10/2020   Lab Results  Component Value Date   HDL 75.90 12/10/2020   Lab Results  Component Value Date   LDLCALC 76 12/10/2020   Lab Results  Component Value Date   TRIG 134.0 12/10/2020   Lab Results  Component Value Date   CHOLHDL 2 12/10/2020   Lab Results  Component Value Date   HGBA1C 8.8 (H) 12/10/2020       Assessment & Plan:   Problem List Items Addressed This Visit    Allergy    Worse in the morning but better throughout the day. No changes in therapy today      Diabetes mellitus type 2 in obese  (HCC)    hgba1c acceptable, minimize simple carbs. Increase exercise as tolerated. Continue current meds      Hypertension    Well controlled, no changes to meds. Encouraged heart healthy diet such as the DASH diet and exercise as tolerated.       Headache    Increased frequency and intensity of headaches since her COVID infection. Maintain increased hydration, 64 ounces of clear fluids daily. Minimize alcohol and caffeine. Eat small frequent meals with lean proteins and complex carbs. Avoid high and low blood sugars. Get adequate sleep, 7-8 hours a night. Needs exercise daily preferably in the morning.      Major depressive disorder, recurrent, mild (HCC)    As the COVID and staph infections have caused long term disability she has struggled more with depression, she is following with a new psychiatrist, Dr Daleen Bo and is going to contact her as she does not like the way it makes her feel. Especially flat and if she misses a dose more agitated and uncomfortable.  Has tolerated Venlafaxine in the past but it was stopped due to the hot flashes of perimenopause but she is considering restarting.       Back pain    Encouraged moist heat and gentle stretching as tolerated. May try NSAIDs and prescription meds as directed and report if symptoms worsen or seek immediate care. 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 She is not able to work consistently full time. We will complete FMLA paperwork for patient to allow her to miss days as needed when she is having exacerbations and rough days. She will also need days off at intervals to attend doctor's appointments. Discussed her current symptoms and plan of care for 30 minutes.       Staph infection    Recurrent,  extensive and debilitating at times. It is some improved at this time         I am having Kytzia Z. Nylund maintain her sodium chloride, hyoscyamine, Premarin, BLACK COHOSH PO, (Ginger, Zingiber officinalis, (GINGER PO)), Probiotic Product (PROBIOTIC PO), celecoxib, baclofen, montelukast, rosuvastatin, cyclobenzaprine, levocetirizine, losartan, promethazine, metFORMIN, propranolol, omeprazole, fluticasone, fluconazole, zolpidem, amphetamine-dextroamphetamine, albuterol, Flovent HFA, glimepiride, ACETAMINOPHEN-BUTALBITAL, sulfamethoxazole-trimethoprim, busPIRone, amphetamine-dextroamphetamine, amphetamine-dextroamphetamine, ondansetron, mupirocin cream, DULoxetine, ALPRAZolam, gabapentin, baclofen, HYDROcodone-acetaminophen, and SUMAtriptan.  No orders of the defined types were placed in this encounter.   I discussed the assessment and treatment plan with the patient. The patient was provided an opportunity to ask questions and all were answered. The patient agreed with the plan and demonstrated an understanding of the instructions.   The patient was advised to call back or seek an in-person evaluation if the symptoms worsen or if the condition fails to improve as anticipated.  I provided 35 minutes of face-to-face time during this encounter.   Danise Edge, MD [facility] [phone] (phone) [phone] (fax)  [facility]

## 2021-01-02 NOTE — Assessment & Plan Note (Signed)
Recurrent, extensive and debilitating at times. It is some improved at this time

## 2021-01-02 NOTE — Assessment & Plan Note (Signed)
Worse in the morning but better throughout the day. No changes in therapy today

## 2021-01-02 NOTE — Assessment & Plan Note (Signed)
hgba1c acceptable, minimize simple carbs. Increase exercise as tolerated. Continue current meds 

## 2021-01-07 ENCOUNTER — Other Ambulatory Visit: Payer: Self-pay | Admitting: Family Medicine

## 2021-01-08 ENCOUNTER — Telehealth (HOSPITAL_COMMUNITY): Payer: Self-pay | Admitting: *Deleted

## 2021-01-08 NOTE — Telephone Encounter (Signed)
Placed call to patient to inform of cancelled appointment and inability to continue to provide services. Spoke with patient  regarding this and information that a letter with resources would be provided. Patient verbalized understanding.  Did not need refills.   

## 2021-01-10 ENCOUNTER — Other Ambulatory Visit: Payer: Self-pay | Admitting: Family Medicine

## 2021-01-10 ENCOUNTER — Telehealth: Payer: Self-pay | Admitting: Family Medicine

## 2021-01-10 MED ORDER — FLUOXETINE HCL 20 MG PO CAPS: 20.0000 mg | ORAL_CAPSULE | Freq: Every day | ORAL | 3 refills | Status: DC

## 2021-01-10 NOTE — Telephone Encounter (Signed)
Not sure if she spoke about this at her last visit.

## 2021-01-10 NOTE — Telephone Encounter (Signed)
I sent her in Fluoxetine 20 mg po daily. Let her know and make sure she has a f/u in 8-12 weeks

## 2021-01-10 NOTE — Telephone Encounter (Signed)
Patient wanted to inform Dr. Leonard Schwartz  That one of her  physctrist has retired and the other  one quit. She is in the process of looking for a new group. In the meantime she still needs to be on something..  She states that she didn't like the way cymbalta made her feel. She stated that she was on  Prozac in the past & she was 20 mg away from the highest dosage. She states that she dont mind going back on that but will let Dr. B decide. She wanted it noted that she has been off her meds for 20-25 days. And she is need of something soon.

## 2021-01-11 NOTE — Telephone Encounter (Signed)
Let pt know that medication was sent in and to keep f/u visit

## 2021-01-11 NOTE — Telephone Encounter (Signed)
Left message on machine to call back.  Patient has an appointment for 03/28/21 and we do not need to schedule an appt.  Just let her know rx has been sent in.

## 2021-01-17 ENCOUNTER — Telehealth (HOSPITAL_COMMUNITY): Payer: 59 | Admitting: Psychiatry

## 2021-01-23 ENCOUNTER — Telehealth: Payer: Self-pay | Admitting: Family Medicine

## 2021-01-23 ENCOUNTER — Other Ambulatory Visit: Payer: Self-pay | Admitting: Family Medicine

## 2021-01-23 ENCOUNTER — Other Ambulatory Visit: Payer: Self-pay | Admitting: Family

## 2021-01-23 MED ORDER — GABAPENTIN 600 MG PO TABS: 600.0000 mg | ORAL_TABLET | Freq: Three times a day (TID) | ORAL | 0 refills | Status: DC

## 2021-01-23 NOTE — Telephone Encounter (Signed)
Paperwork was placed in the doctor's bin.  The patient sends an e-mail with a designation notice under the Family and Medical Leave Act. states she needs paperwork by Friday of this week.  Two Areas that require correction:  #5: Please provide two treatment dates employee was seen in the last 12 months #9 Please provide more specific information regarding frequency/duration is accurate and your best estimate. Frequency and Duration States: You will need to be absent once a week for a length of 3 days each time.

## 2021-01-23 NOTE — Telephone Encounter (Signed)
Is a total of 2 pages

## 2021-01-24 MED ORDER — BUTALBITAL-ACETAMINOPHEN 50-325 MG PO TABS: 1.0000 | ORAL_TABLET | Freq: Two times a day (BID) | ORAL | 0 refills | Status: DC | PRN

## 2021-01-24 MED ORDER — ALBUTEROL SULFATE HFA 108 (90 BASE) MCG/ACT IN AERS: 2.0000 | INHALATION_SPRAY | Freq: Four times a day (QID) | RESPIRATORY_TRACT | 5 refills | Status: DC | PRN

## 2021-01-24 MED ORDER — HYDROCODONE-ACETAMINOPHEN 7.5-325 MG PO TABS: 1.0000 | ORAL_TABLET | Freq: Four times a day (QID) | ORAL | 0 refills | Status: DC | PRN

## 2021-01-24 NOTE — Telephone Encounter (Signed)
Requesting: hydrocodone 7.5-325mg  and acetaminophen-butalbital 50-325mg  Contract: 09/10/2017 UDS: 12/10/2020 Last Visit: 01/02/2021 Next Visit: 03/28/2021 Last Refill on hydrocodone: 12/23/2020 #100 and 0RF Last Refill on acetaminophen-butalbital: 10/16/2020 #60 and 0RF  Please Advise

## 2021-01-25 ENCOUNTER — Telehealth: Payer: Self-pay | Admitting: *Deleted

## 2021-01-25 NOTE — Telephone Encounter (Signed)
Called and left message with HR (at302pm) advising that we need clarification on what they needed and to make sure form was sent back correctly.  Advised that if I did not hear back from them within the hour that I would send them what we had.    Advised patient of note above.  405pm did not receive call back, so I emailed form corrected back to HR.

## 2021-01-25 NOTE — Telephone Encounter (Signed)
Spoke with patient and she wanted to know if it is time to titrate up on her dose of fluoxetine?  She started on 01/10/21 and she feels that it is not working enough.

## 2021-01-25 NOTE — Telephone Encounter (Signed)
The for this specific question they would like: #9 Please provide more specific information regarding frequency/duration is accurate and your best estimate.  I will be happy to write in for you.  Also it has 3 boxes at the beginning of the sentence will it be was/is/will.

## 2021-01-25 NOTE — Telephone Encounter (Signed)
Left detailed message on machine and also mychart message sent.

## 2021-01-28 ENCOUNTER — Telehealth: Payer: Self-pay | Admitting: *Deleted

## 2021-01-28 NOTE — Telephone Encounter (Signed)
Prior auth for albuterol inhaler started via cover my meds.    Key: [id number]

## 2021-01-29 ENCOUNTER — Other Ambulatory Visit: Payer: Self-pay | Admitting: *Deleted

## 2021-01-29 MED ORDER — ALBUTEROL SULFATE HFA 108 (90 BASE) MCG/ACT IN AERS: 2.0000 | INHALATION_SPRAY | Freq: Four times a day (QID) | RESPIRATORY_TRACT | 5 refills | Status: DC | PRN

## 2021-01-30 ENCOUNTER — Encounter: Payer: Self-pay | Admitting: Family Medicine

## 2021-01-30 ENCOUNTER — Other Ambulatory Visit: Payer: Self-pay | Admitting: Family Medicine

## 2021-01-30 MED ORDER — SULFAMETHOXAZOLE-TRIMETHOPRIM 800-160 MG PO TABS: 1.0000 | ORAL_TABLET | Freq: Two times a day (BID) | ORAL | 0 refills | Status: DC

## 2021-02-07 ENCOUNTER — Other Ambulatory Visit: Payer: Self-pay | Admitting: Family Medicine

## 2021-02-12 ENCOUNTER — Other Ambulatory Visit: Payer: Self-pay

## 2021-02-12 MED ORDER — PROPRANOLOL HCL 60 MG PO TABS: ORAL_TABLET | ORAL | 0 refills | Status: DC

## 2021-02-22 ENCOUNTER — Other Ambulatory Visit: Payer: Self-pay | Admitting: *Deleted

## 2021-02-22 DIAGNOSIS — R000 Tachycardia, unspecified: Secondary | ICD-10-CM

## 2021-02-22 DIAGNOSIS — I10 Essential (primary) hypertension: Secondary | ICD-10-CM

## 2021-02-22 DIAGNOSIS — E782 Mixed hyperlipidemia: Secondary | ICD-10-CM

## 2021-02-22 MED ORDER — LOSARTAN POTASSIUM 50 MG PO TABS: ORAL_TABLET | ORAL | 0 refills | Status: DC

## 2021-02-27 ENCOUNTER — Other Ambulatory Visit: Payer: Self-pay | Admitting: Family Medicine

## 2021-02-27 MED ORDER — GABAPENTIN 600 MG PO TABS: 600.0000 mg | ORAL_TABLET | Freq: Three times a day (TID) | ORAL | 0 refills | Status: DC

## 2021-02-27 MED ORDER — HYDROCODONE-ACETAMINOPHEN 7.5-325 MG PO TABS: 1.0000 | ORAL_TABLET | Freq: Four times a day (QID) | ORAL | 0 refills | Status: DC | PRN

## 2021-02-27 MED ORDER — HYOSCYAMINE SULFATE 0.125 MG SL SUBL: 0.1250 mg | SUBLINGUAL_TABLET | SUBLINGUAL | 0 refills | Status: DC | PRN

## 2021-02-27 MED ORDER — BUTALBITAL-ACETAMINOPHEN 50-325 MG PO TABS: 1.0000 | ORAL_TABLET | Freq: Two times a day (BID) | ORAL | 0 refills | Status: DC | PRN

## 2021-02-27 NOTE — Telephone Encounter (Signed)
Requesting: hydrocodone and acetaminophen-butalbital Contract:  UDS: 12/10/20 Last Visit: 01/02/21 Next Visit: 03/28/21 Last Refill: 01/24/21  Please Advise

## 2021-03-05 ENCOUNTER — Other Ambulatory Visit: Payer: Self-pay

## 2021-03-05 ENCOUNTER — Other Ambulatory Visit: Payer: Self-pay | Admitting: Family Medicine

## 2021-03-05 ENCOUNTER — Encounter: Payer: Self-pay | Admitting: Internal Medicine

## 2021-03-05 ENCOUNTER — Ambulatory Visit (INDEPENDENT_AMBULATORY_CARE_PROVIDER_SITE_OTHER): Payer: 59 | Admitting: Internal Medicine

## 2021-03-05 ENCOUNTER — Telehealth: Payer: Self-pay | Admitting: Family Medicine

## 2021-03-05 VITALS — BP 144/96 | HR 91 | Ht 63.0 in | Wt 180.0 lb

## 2021-03-05 DIAGNOSIS — E1169 Type 2 diabetes mellitus with other specified complication: Secondary | ICD-10-CM

## 2021-03-05 DIAGNOSIS — E669 Obesity, unspecified: Secondary | ICD-10-CM | POA: Diagnosis not present

## 2021-03-05 DIAGNOSIS — E1165 Type 2 diabetes mellitus with hyperglycemia: Secondary | ICD-10-CM | POA: Diagnosis not present

## 2021-03-05 DIAGNOSIS — E1142 Type 2 diabetes mellitus with diabetic polyneuropathy: Secondary | ICD-10-CM | POA: Diagnosis not present

## 2021-03-05 LAB — POCT GLYCOSYLATED HEMOGLOBIN (HGB A1C): Hemoglobin A1C: 8.5 % — AB (ref 4.0–5.6)

## 2021-03-05 MED ORDER — FLUOXETINE HCL 20 MG PO CAPS: 60.0000 mg | ORAL_CAPSULE | Freq: Every day | ORAL | 3 refills | Status: DC

## 2021-03-05 MED ORDER — METFORMIN HCL 500 MG PO TABS: 1000.0000 mg | ORAL_TABLET | Freq: Two times a day (BID) | ORAL | 3 refills | Status: DC

## 2021-03-05 MED ORDER — ONETOUCH VERIO VI STRP
1.0000 | ORAL_STRIP | Freq: Every day | 6 refills | Status: DC
Start: 2021-03-05 — End: 2023-05-05

## 2021-03-05 MED ORDER — GLIMEPIRIDE 2 MG PO TABS: 2.0000 mg | ORAL_TABLET | Freq: Every day | ORAL | 1 refills | Status: DC

## 2021-03-05 NOTE — Progress Notes (Signed)
Name: Nicole Ball  MRN/ DOB: [id number], [birth year]   Age/ Sex: 45 y.o., female    PCP: [name], MD   Reason for Endocrinology Evaluation: Type 2 Diabetes Mellitus     Date of Initial Endocrinology Visit: 03/05/2021     PATIENT IDENTIFIER: Nicole Ball is a 45 y.o. female with a past medical history of T2DM and HTN. The patient presented for initial endocrinology clinic visit on 03/05/2021 for consultative assistance with her diabetes management.    HPI: Ms. Nicole Ball was    Diagnosed with DM in 2015 Prior Medications tried/Intolerance: as listed  Currently checking blood sugars occasionally  Hypoglycemia episodes : no                Hemoglobin A1c has ranged from 7.1% in 2016, peaking at 10.0% in 2022. Patient required assistance for hypoglycemia:  Patient has required hospitalization within the last 1 year from hyper or hypoglycemia: no  In terms of diet, the patient eats 2 meals a day and snacks . Drinks sugar-sweetened beverages   Admits to imperfect adherence to medications  Recent MRSA infection , on Abx  Has not been taking Glimepiride due to hypoglycemia  Denies pancreatitis   Nicole Ball on Ozempic and it makes him sick. She already has nausea issues    HOME DIABETES REGIMEN: Glimepiride 2 mg BID- doesn't take unless her sugars are high  Metformin 500 mg 2 tabs BID     Statin: yes ACE-I/ARB: yes Prior Diabetic Education: yes    METER DOWNLOAD SUMMARY: Did not bring    DIABETIC COMPLICATIONS: Microvascular complications:  Neuropathy Denies: CKD, retinopathy  Last eye exam: Completed 12/ 2021  Macrovascular complications:   Denies: CAD, PVD, CVA   PAST HISTORY: Past Medical History:  Past Medical History:  Diagnosis Date   Abnormal serum level of alkaline phosphatase 05/21/2014   Acute low back pain 12/19/2010   ALLERGIC RHINITIS 10/16/2010   Anxiety and depression 06/07/2011   Atrophic vaginitis 08/17/2016   Cervical cancer  screening 01/10/2014   Menarche at 8 Regular and moderate flow, became irregular until OCPs  history of abnormal pap in past, repeat was normal Last pap roughly 3 years ago G0P0, s/p No history of abnormal MGM, no h/o MGM  No concerns today no gyn surgeries LMP May 2013   CTS (carpal tunnel syndrome) 01/15/2014   Diabetes mellitus type 2 in obese (HCC) 10/30/2011   Dry eyes 08/17/2016   Early menopause    Essential hypertension    HIATAL HERNIA WITH REFLUX, HX OF 10/16/2010   Hyperlipidemia 04/21/2011   Inappropriate sinus tachycardia    INSOMNIA 10/16/2010   Knee pain, left 03/11/2012   Migraine 03/07/2013   Morbid obesity (HCC)    Palpitations 01/28/2015   Reflux 04/22/2012   Sleep apnea 06/07/2011   Tobacco use disorder 02/10/2017   Urinary urgency 08/17/2016   UTI'S, HX OF 10/16/2010   Past Surgical History:  Past Surgical History:  Procedure Laterality Date   CHOLECYSTECTOMY      Social History:  reports that she has been smoking cigars. She has been smoking an average of .1 packs per day. She has never used smokeless tobacco. She reports current alcohol use. She reports that she does not use drugs. Family History:  Family History  Problem Relation Age of Onset   Lupus Mother    Arthritis Maternal Grandmother    Scleroderma Maternal Grandmother    Glaucoma Maternal Grandmother    Cataracts Maternal Grandmother  Cancer Maternal Grandfather        prostate   Coronary artery disease Maternal Grandfather        s/p angioplasty   Diabetes Maternal Grandfather        Type 2   Cancer Paternal Grandmother        Breast ca- dx in 50's   Thyroid disease Paternal Grandmother    Breast cancer Paternal Grandmother    Diabetes Paternal Grandfather    Thyroid disease Sister    Asthma Sister    Allergies Brother    Cancer Father        cigarette, alcohol HEENT squamous cell   Alcohol abuse Father    Arthritis Other    Alcohol abuse Maternal Aunt    Alcohol abuse Maternal Uncle      HOME  MEDICATIONS: Allergies as of 03/05/2021   No Known Allergies      Medication List        Accurate as of March 05, 2021 10:56 AM. If you have any questions, ask your nurse or doctor.          ACETAMINOPHEN-BUTALBITAL 50-325 MG Tabs Take 1 tablet by mouth 2 (two) times daily as needed.   albuterol 108 (90 Base) MCG/ACT inhaler Commonly known as: VENTOLIN HFA Inhale 2 puffs into the lungs every 6 (six) hours as needed for wheezing or shortness of breath.   ALPRAZolam 0.5 MG tablet Commonly known as: Xanax Take 1 tablet (0.5 mg total) by mouth 2 (two) times daily as needed for anxiety or sleep.   amphetamine-dextroamphetamine 30 MG 24 hr capsule Commonly known as: Adderall XR Take 2 capsules (60 mg total) by mouth every morning.   amphetamine-dextroamphetamine 30 MG 24 hr capsule Commonly known as: Adderall XR Take 2 capsules (60 mg total) by mouth every morning.   amphetamine-dextroamphetamine 30 MG 24 hr capsule Commonly known as: Adderall XR Take 2 capsules (60 mg total) by mouth every morning.   baclofen 10 MG tablet Commonly known as: LIORESAL Take 1 tablet (10 mg total) by mouth 2 (two) times daily as needed for muscle spasms.   baclofen 10 MG tablet Commonly known as: LIORESAL Take 1 tablet (10 mg total) by mouth 3 (three) times daily.   BLACK COHOSH PO Take 1 tablet by mouth daily.   busPIRone 15 MG tablet Commonly known as: BUSPAR Take 1 tablet (15 mg total) by mouth 2 (two) times daily.   celecoxib 200 MG capsule Commonly known as: CeleBREX One to 2 tablets by mouth daily as needed for pain.   Flovent HFA 220 MCG/ACT inhaler Generic drug: fluticasone Inhale 1 puff into the lungs in the morning and at bedtime.   fluconazole 150 MG tablet Commonly known as: DIFLUCAN Take 1 tablet (150 mg total) by mouth once a week.   FLUoxetine 20 MG capsule Commonly known as: PROZAC Take 1 capsule (20 mg total) by mouth daily.   fluticasone 50 MCG/ACT nasal  spray Commonly known as: FLONASE SHAKE LIQUID AND USE 2 SPRAYS IN EACH NOSTRIL DAILY   gabapentin 600 MG tablet Commonly known as: NEURONTIN Take 1 tablet (600 mg total) by mouth 3 (three) times daily.   GINGER PO Take 1 tablet by mouth daily.   glimepiride 2 MG tablet Commonly known as: AMARYL Take 1 tablet (2 mg total) by mouth in the morning and at bedtime.   HYDROcodone-acetaminophen 7.5-325 MG tablet Commonly known as: Norco Take 1 tablet by mouth every 6 (six) hours as needed for  moderate pain.   hyoscyamine 0.125 MG SL tablet Commonly known as: LEVSIN SL Take 1 tablet (0.125 mg total) by mouth every 4 (four) hours as needed.   levocetirizine 5 MG tablet Commonly known as: XYZAL TAKE 1 TABLET(5 MG) BY MOUTH EVERY EVENING   losartan 50 MG tablet Commonly known as: COZAAR TAKE 1 TABLET(50 MG) BY MOUTH DAILY PT OVERDUE FOR AN APPT. 1ST ATTEMPT. PLEASE HAVE PT CALL TO SCHEDULE.  CAN AUTHORIZE 30 DAYS   metFORMIN 500 MG tablet Commonly known as: GLUCOPHAGE TAKE 2 TABLETS BY MOUTH TWICE DAILY   montelukast 10 MG tablet Commonly known as: SINGULAIR TAKE 1 TABLET(10 MG) BY MOUTH AT BEDTIME   mupirocin cream 2 % Commonly known as: Bactroban Apply via clean qtip to nares qhs   omeprazole 20 MG capsule Commonly known as: PRILOSEC TAKE 1 CAPSULE(20 MG) BY MOUTH TWICE DAILY BEFORE A MEAL   ondansetron 4 MG tablet Commonly known as: ZOFRAN TAKE 1 TABLET(4 MG) BY MOUTH EVERY 8 HOURS AS NEEDED FOR NAUSEA OR VOMITING   Premarin vaginal cream Generic drug: conjugated estrogens Apply small amount to affected area twice a week   PROBIOTIC PO Take 1 tablet by mouth daily.   promethazine 25 MG tablet Commonly known as: PHENERGAN TAKE 1 TABLET(25 MG) BY MOUTH EVERY 8 HOURS AS NEEDED FOR NAUSEA OR VOMITING   propranolol 60 MG tablet Commonly known as: INDERAL TAKE 1 TABLET(60 MG) BY MOUTH TWICE DAILY. Please make yearly appt with Dr. Shari Prows (Cardiologist) for  September 2022 before anymore refills. Thank you 1st attempt   rosuvastatin 5 MG tablet Commonly known as: CRESTOR Take 1 tablet (5 mg total) by mouth 3 (three) times a week.   sodium chloride 0.65 % Soln nasal spray Commonly known as: OCEAN Place 1 spray into both nostrils daily.   sulfamethoxazole-trimethoprim 800-160 MG tablet Commonly known as: BACTRIM DS Take 1 tablet by mouth 2 (two) times daily.   SUMAtriptan 50 MG tablet Commonly known as: IMITREX TAKE 1 TABLET BY MOUTH EVERY 2 HOURS AS NEEDED FOR MIGRAINE. MAY REPEAT IN 2 HOURS IF HEADACHE PERSISTS OR RECURS   zolpidem 10 MG tablet Commonly known as: AMBIEN Take 1 tablet (10 mg total) by mouth at bedtime as needed for sleep.         ALLERGIES: No Known Allergies   REVIEW OF SYSTEMS: A comprehensive ROS was conducted with the patient and is negative except as per HPI     OBJECTIVE:   VITAL SIGNS: BP (!) 144/96   Pulse 91   Ht 5\' 3"  (1.6 m)   Wt 180 lb (81.6 kg)   LMP 01/08/2013   SpO2 98%   BMI 31.89 kg/m    PHYSICAL EXAM:  General: Pt appears well and is in NAD  Neck: General: Supple without adenopathy or carotid bruits. Thyroid: Thyroid size normal.  No goiter or nodules appreciated.   Lungs: Clear with good BS bilat with no rales, rhonchi, or wheezes  Heart: RRR with normal S1 and S2 and no gallops; no murmurs; no rub  Abdomen: Normoactive bowel sounds, soft, nontender, without masses or organomegaly palpable  Extremities:  Lower extremities - No pretibial edema. No lesions.  Skin: Normal texture and temperature to palpation. No rash noted. No Acanthosis nigricans/skin tags. No lipohypertrophy.  Neuro: MS is good with appropriate affect, pt is alert and Ox3    DM foot exam: 03/05/2021   The skin of the feet is intact without sores or ulcerations. The pedal  pulses are 2+ on right and 2+ on left. The sensation is intact to a screening 5.07, 10 gram monofilament bilaterally  DATA  REVIEWED:  Lab Results  Component Value Date   HGBA1C 8.5 (A) 03/05/2021   HGBA1C 8.8 (H) 12/10/2020   HGBA1C 10.0 (H) 09/13/2020   Lab Results  Component Value Date   MICROALBUR <0.7 09/17/2015   LDLCALC 76 12/10/2020   CREATININE 0.68 12/10/2020   Lab Results  Component Value Date   MICRALBCREAT 0.9 09/17/2015    Lab Results  Component Value Date   CHOL 179 12/10/2020   HDL 75.90 12/10/2020   LDLCALC 76 12/10/2020   TRIG 134.0 12/10/2020   CHOLHDL 2 12/10/2020        ASSESSMENT / PLAN / RECOMMENDATIONS:   1) Type 2 Diabetes Mellitus, Poorly controlled, Withneuropathic complications - Most recent A1c of 8.5 %. Goal A1c < 7.0 %.    Plan: GENERAL: I have discussed with the patient the pathophysiology of diabetes. We went over the natural progression of the disease. We talked about both insulin resistance and insulin deficiency. We stressed the importance of lifestyle changes including diet and exercise. I explained the complications associated with diabetes including retinopathy, nephropathy, neuropathy as well as increased risk of cardiovascular disease. We went over the benefit seen with glycemic control.   I explained to the patient that diabetic patients are at higher than normal risk for amputations.  Poorly controlled diabetes due to medication non adherence and dietary indiscretions, we discussed the importance of avoiding sugar-sweetened beverages as well as avoiding snacks.  We discussed add-on therapy with GLP-1 agonists but her Nicole Ball is on Ozempic and he gets sick 2 within 2 days of taking it and she would like to avoid this .  We also discussed SGLT-2 inhibitors , but given that she has been having MRSA infections, even though the risk is mainly with genital infections with SGLT-2 inhibitors, we opted to hold off on this for now  but will consider this in the future Will restart Glimepiride as below   MEDICATIONS:  Continue Metformin 500 mg, TWO Tablets in the  morning and TWO tablets in the evening  Take Glimepiride 2 mg, ONE tablet BEFORE the first meal of the day   EDUCATION / INSTRUCTIONS: BG monitoring instructions: Patient is instructed to check her blood sugars 1 times a day, fasting . Call [facility] if: BG persistently < 70  I reviewed the Rule of 15 for the treatment of hypoglycemia in detail with the patient. Literature supplied.   2) Diabetic complications:  Eye: Does not have known diabetic retinopathy.  Neuro/ Feet: Does  have known diabetic peripheral neuropathy. Renal: Patient does not have known baseline CKD. She is  on an ACEI/ARB at present.   3) Dyslipidemia: Patient is on Rosuvastatin. LDL at goal  Discussed cardiovascular benefits, and encouraged compliance    Continue Rosuvastatin 5 mg daily   F/U 4 months    Signed electronically by: Lyndle Herrlich, MD  [facility]  [facility] [address] [place], [place] [address] Phone: [phone] FAX: [phone]   CC: [name], MD [address] [place] [address] Phone: [phone]  Fax: [phone]    Return to Endocrinology clinic as below: Future Appointments  Date Time Provider Department Center  03/28/2021  2:40 PM [name], MD [facility]  06/18/2021  2:40 PM [name], MD [facility]

## 2021-03-05 NOTE — Patient Instructions (Signed)
-   Continue Metformin 500 mg, TWO Tablets in the morning and TWO tablets in the evening  - Take Glimepiride 2 mg, ONE tablet BEFORE the first meal of the day      HOW TO TREAT LOW BLOOD SUGARS (Blood sugar LESS THAN 70 MG/DL) Please follow the RULE OF 15 for the treatment of hypoglycemia treatment (when your (blood sugars are less than 70 mg/dL)   STEP 1: Take 15 grams of carbohydrates when your blood sugar is low, which includes:  3-4 GLUCOSE TABS  OR 3-4 OZ OF JUICE OR REGULAR SODA OR ONE TUBE OF GLUCOSE GEL    STEP 2: RECHECK blood sugar in 15 MINUTES STEP 3: If your blood sugar is still low at the 15 minute recheck --> then, go back to STEP 1 and treat AGAIN with another 15 grams of carbohydrates.

## 2021-03-05 NOTE — Telephone Encounter (Signed)
The prescription was prescribed for 20 mg, but it wasn't working, so she went ahead and upped the dosage to 40 mg for the past 2 weeks. She would like to go back up to 60 mg in the hopes of getting up to 80 mg.   Due to her increasing her dosage she is now out of medication.   FLUoxetine (PROZAC) 20 MG capsule [[id number]]   [id number] - [place], [place] - [address] AT [facility] [address]  [address], [place] [address]  Phone:  [phone]  Fax:  [phone]

## 2021-03-15 ENCOUNTER — Encounter: Payer: Self-pay | Admitting: Family Medicine

## 2021-03-18 ENCOUNTER — Other Ambulatory Visit: Payer: Self-pay | Admitting: Family Medicine

## 2021-03-18 ENCOUNTER — Other Ambulatory Visit: Payer: Self-pay | Admitting: *Deleted

## 2021-03-18 MED ORDER — AMPHETAMINE-DEXTROAMPHET ER 30 MG PO CP24: 60.0000 mg | ORAL_CAPSULE | ORAL | 0 refills | Status: DC

## 2021-03-18 MED ORDER — MONTELUKAST SODIUM 10 MG PO TABS: ORAL_TABLET | ORAL | 1 refills | Status: DC

## 2021-03-28 ENCOUNTER — Ambulatory Visit: Payer: 59 | Admitting: Family Medicine

## 2021-03-28 ENCOUNTER — Other Ambulatory Visit: Payer: Self-pay

## 2021-03-28 VITALS — BP 136/88 | HR 76 | Temp 98.2°F | Resp 18 | Ht 63.0 in | Wt 179.0 lb

## 2021-03-28 DIAGNOSIS — Z23 Encounter for immunization: Secondary | ICD-10-CM

## 2021-03-28 DIAGNOSIS — E782 Mixed hyperlipidemia: Secondary | ICD-10-CM | POA: Diagnosis not present

## 2021-03-28 DIAGNOSIS — E1169 Type 2 diabetes mellitus with other specified complication: Secondary | ICD-10-CM | POA: Diagnosis not present

## 2021-03-28 DIAGNOSIS — I10 Essential (primary) hypertension: Secondary | ICD-10-CM | POA: Diagnosis not present

## 2021-03-28 DIAGNOSIS — Z1211 Encounter for screening for malignant neoplasm of colon: Secondary | ICD-10-CM

## 2021-03-28 DIAGNOSIS — L309 Dermatitis, unspecified: Secondary | ICD-10-CM

## 2021-03-28 DIAGNOSIS — E669 Obesity, unspecified: Secondary | ICD-10-CM

## 2021-03-28 DIAGNOSIS — T7840XD Allergy, unspecified, subsequent encounter: Secondary | ICD-10-CM

## 2021-03-28 MED ORDER — HYDROCORTISONE 2 % EX LOTN: 1.0000 | TOPICAL_LOTION | Freq: Every day | CUTANEOUS | 1 refills | Status: DC | PRN

## 2021-03-28 MED ORDER — FLUTICASONE PROPIONATE 50 MCG/ACT NA SUSP: NASAL | 1 refills | Status: DC

## 2021-03-28 MED ORDER — LEVOCETIRIZINE DIHYDROCHLORIDE 5 MG PO TABS: 5.0000 mg | ORAL_TABLET | Freq: Two times a day (BID) | ORAL | 5 refills | Status: DC

## 2021-03-28 MED ORDER — AZELASTINE HCL 0.1 % NA SOLN: 2.0000 | Freq: Two times a day (BID) | NASAL | 12 refills | Status: DC

## 2021-03-28 MED ORDER — MONTELUKAST SODIUM 10 MG PO TABS: ORAL_TABLET | ORAL | 1 refills | Status: DC

## 2021-03-28 NOTE — Assessment & Plan Note (Signed)
Well controlled, no changes to meds. Encouraged heart healthy diet such as the DASH diet and exercise as tolerated.  °

## 2021-03-28 NOTE — Assessment & Plan Note (Signed)
Encourage heart healthy diet such as MIND or DASH diet, increase exercise, avoid trans fats, simple carbohydrates and processed foods, consider a krill or fish or flaxseed oil cap daily.  °

## 2021-03-28 NOTE — Assessment & Plan Note (Signed)
hgba1c acceptable, minimize simple carbs. Increase exercise as tolerated. Continue current meds 

## 2021-03-28 NOTE — Progress Notes (Signed)
Patient ID: Nicole Ball, female    DOB: [birth year]  Age: 45 y.o. MRN: [id number]    Subjective:  Subjective  HPI Nicole Ball presents for office visit today for follow up on staph infection recovery and htn. She reports that she hsa been congested for a couple of months now and last week it has worsened. She has had an allergic reaction yesterday which she suspects was due to either her laundry detergent and/or the food she had. She noticed that she experiences itchiness in feel when wearing socks. Denies CP/palp/SOB/HA/fevers/GI or GU c/o. Taking meds as prescribed.   Review of Systems  Constitutional:  Negative for chills, fatigue and fever.  HENT:  Positive for congestion. Negative for rhinorrhea, sinus pressure, sinus pain and sore throat.   Eyes:  Negative for pain.  Respiratory:  Negative for cough and shortness of breath.   Cardiovascular:  Negative for chest pain, palpitations and leg swelling.  Gastrointestinal:  Negative for abdominal pain, blood in stool, diarrhea, nausea and vomiting.  Genitourinary:  Negative for decreased urine volume, flank pain, frequency, vaginal bleeding and vaginal discharge.  Musculoskeletal:  Negative for back pain.  Allergic/Immunologic: Positive for environmental allergies and food allergies.  Neurological:  Negative for headaches.   History Past Medical History:  Diagnosis Date   Abnormal serum level of alkaline phosphatase 05/21/2014   Acute low back pain 12/19/2010   ALLERGIC RHINITIS 10/16/2010   Anxiety and depression 06/07/2011   Atrophic vaginitis 08/17/2016   Cervical cancer screening 01/10/2014   Menarche at 8 Regular and moderate flow, became irregular until OCPs  history of abnormal pap in past, repeat was normal Last pap roughly 3 years ago G0P0, s/p No history of abnormal MGM, no h/o MGM  No concerns today no gyn surgeries LMP May 2013   CTS (carpal tunnel syndrome) 01/15/2014   Diabetes mellitus type 2 in obese (HCC) 10/30/2011    Dry eyes 08/17/2016   Early menopause    Essential hypertension    HIATAL HERNIA WITH REFLUX, HX OF 10/16/2010   Hyperlipidemia 04/21/2011   Inappropriate sinus tachycardia    INSOMNIA 10/16/2010   Knee pain, left 03/11/2012   Migraine 03/07/2013   Morbid obesity (HCC)    Palpitations 01/28/2015   Reflux 04/22/2012   Sleep apnea 06/07/2011   Tobacco use disorder 02/10/2017   Urinary urgency 08/17/2016   UTI'S, HX OF 10/16/2010    She has a past surgical history that includes Cholecystectomy.   Her family history includes Alcohol abuse in her father, maternal aunt, and maternal uncle; Allergies in her brother; Arthritis in her maternal grandmother and another family member; Asthma in her sister; Breast cancer in her paternal grandmother; Cancer in her father, maternal grandfather, and paternal grandmother; Cataracts in her maternal grandmother; Coronary artery disease in her maternal grandfather; Diabetes in her maternal grandfather and paternal grandfather; Glaucoma in her maternal grandmother; Lupus in her mother; Scleroderma in her maternal grandmother; Thyroid disease in her paternal grandmother and sister.She reports that she has been smoking cigars. She has been smoking an average of .1 packs per day. She has never used smokeless tobacco. She reports current alcohol use. She reports that she does not use drugs.  Current Outpatient Medications on File Prior to Visit  Medication Sig Dispense Refill   ACETAMINOPHEN-BUTALBITAL 50-325 MG TABS Take 1 tablet by mouth 2 (two) times daily as needed. 60 tablet 0   albuterol (VENTOLIN HFA) 108 (90 Base) MCG/ACT inhaler Inhale 2 puffs into the  lungs every 6 (six) hours as needed for wheezing or shortness of breath. 18 g 5   ALPRAZolam (XANAX) 0.5 MG tablet Take 1 tablet (0.5 mg total) by mouth 2 (two) times daily as needed for anxiety or sleep. 30 tablet 0   amphetamine-dextroamphetamine (ADDERALL XR) 30 MG 24 hr capsule Take 2 capsules (60 mg total) by mouth  every morning. October 2022 60 capsule 0   amphetamine-dextroamphetamine (ADDERALL XR) 30 MG 24 hr capsule Take 2 capsules (60 mg total) by mouth every morning. September 2022 60 capsule 0   amphetamine-dextroamphetamine (ADDERALL XR) 30 MG 24 hr capsule Take 2 capsules (60 mg total) by mouth every morning. August 2022 60 capsule 0   baclofen (LIORESAL) 10 MG tablet Take 1 tablet (10 mg total) by mouth 2 (two) times daily as needed for muscle spasms. 30 each 2   baclofen (LIORESAL) 10 MG tablet Take 1 tablet (10 mg total) by mouth 3 (three) times daily. 30 tablet 0   BLACK COHOSH PO Take 1 tablet by mouth daily.     celecoxib (CELEBREX) 200 MG capsule One to 2 tablets by mouth daily as needed for pain. 60 capsule 2   conjugated estrogens (PREMARIN) vaginal cream Apply small amount to affected area twice a week 42.5 g 1   fluconazole (DIFLUCAN) 150 MG tablet Take 1 tablet (150 mg total) by mouth once a week. 4 tablet 1   FLUoxetine (PROZAC) 20 MG capsule Take 3 capsules (60 mg total) by mouth daily. 90 capsule 3   fluticasone (FLOVENT HFA) 220 MCG/ACT inhaler Inhale 1 puff into the lungs in the morning and at bedtime. 1 each 1   gabapentin (NEURONTIN) 600 MG tablet Take 1 tablet (600 mg total) by mouth 3 (three) times daily. 90 tablet 0   Ginger, Zingiber officinalis, (GINGER PO) Take 1 tablet by mouth daily.     glimepiride (AMARYL) 2 MG tablet Take 1 tablet (2 mg total) by mouth daily with breakfast. 90 tablet 1   glucose blood (ONETOUCH VERIO) test strip 1 each by Other route daily in the afternoon. Use as instructed 100 each 6   HYDROcodone-acetaminophen (NORCO) 7.5-325 MG tablet Take 1 tablet by mouth every 6 (six) hours as needed for moderate pain. 100 tablet 0   hyoscyamine (LEVSIN SL) 0.125 MG SL tablet Take 1 tablet (0.125 mg total) by mouth every 4 (four) hours as needed. 30 tablet 0   losartan (COZAAR) 50 MG tablet TAKE 1 TABLET(50 MG) BY MOUTH DAILY PT OVERDUE FOR AN APPT. 1ST ATTEMPT.  PLEASE HAVE PT CALL TO SCHEDULE.  CAN AUTHORIZE 30 DAYS 30 tablet 0   metFORMIN (GLUCOPHAGE) 500 MG tablet Take 2 tablets (1,000 mg total) by mouth 2 (two) times daily. 360 tablet 3   mupirocin cream (BACTROBAN) 2 % Apply via clean qtip to nares qhs 30 g 1   omeprazole (PRILOSEC) 20 MG capsule TAKE 1 CAPSULE(20 MG) BY MOUTH TWICE DAILY BEFORE A MEAL 180 capsule 3   ondansetron (ZOFRAN) 4 MG tablet TAKE 1 TABLET(4 MG) BY MOUTH EVERY 8 HOURS AS NEEDED FOR NAUSEA OR VOMITING 40 tablet 1   Probiotic Product (PROBIOTIC PO) Take 1 tablet by mouth daily.     promethazine (PHENERGAN) 25 MG tablet TAKE 1 TABLET(25 MG) BY MOUTH EVERY 8 HOURS AS NEEDED FOR NAUSEA OR VOMITING 30 tablet 1   propranolol (INDERAL) 60 MG tablet TAKE 1 TABLET(60 MG) BY MOUTH TWICE DAILY. Please make yearly appt with Dr. Shari Prows (Cardiologist) for  September 2022 before anymore refills. Thank you 1st attempt 180 tablet 0   rosuvastatin (CRESTOR) 5 MG tablet Take 1 tablet (5 mg total) by mouth 3 (three) times a week. 36 tablet 3   sodium chloride (OCEAN) 0.65 % SOLN nasal spray Place 1 spray into both nostrils daily.     SUMAtriptan (IMITREX) 50 MG tablet TAKE 1 TABLET BY MOUTH EVERY 2 HOURS AS NEEDED FOR MIGRAINE. MAY REPEAT IN 2 HOURS IF HEADACHE PERSISTS OR RECURS 10 tablet 3   zolpidem (AMBIEN) 10 MG tablet Take 1 tablet (10 mg total) by mouth at bedtime as needed for sleep. 30 tablet 2   No current facility-administered medications on file prior to visit.     Objective:  Objective  Physical Exam BP 136/88   Pulse 76   Temp 98.2 F (36.8 C)   Resp 18   Ht 5\' 3"  (1.6 m)   Wt 179 lb (81.2 kg)   LMP 01/08/2013   SpO2 98%   BMI 31.71 kg/m  Wt Readings from Last 3 Encounters:  03/28/21 179 lb (81.2 kg)  03/05/21 180 lb (81.6 kg)  11/05/20 185 lb 9.6 oz (84.2 kg)     Lab Results  Component Value Date   WBC 6.0 12/10/2020   HGB 12.0 12/10/2020   HCT 36.8 12/10/2020   PLT 231.0 12/10/2020   GLUCOSE 161 (H)  12/10/2020   CHOL 179 12/10/2020   TRIG 134.0 12/10/2020   HDL 75.90 12/10/2020   LDLCALC 76 12/10/2020   ALT 18 12/10/2020   AST 13 12/10/2020   NA 137 12/10/2020   K 3.7 12/10/2020   CL 100 12/10/2020   CREATININE 0.68 12/10/2020   BUN 10 12/10/2020   CO2 26 12/10/2020   TSH 0.82 12/10/2020   HGBA1C 8.5 (A) 03/05/2021   MICROALBUR <0.7 09/17/2015    No results found.   Assessment & Plan:  Plan    Meds ordered this encounter  Medications   azelastine (ASTELIN) 0.1 % nasal spray    Sig: Place 2 sprays into both nostrils 2 (two) times daily. Use in each nostril as directed    Dispense:  30 mL    Refill:  12   fluticasone (FLONASE) 50 MCG/ACT nasal spray    Sig: SHAKE LIQUID AND USE 2 SPRAYS IN EACH NOSTRIL DAILY    Dispense:  16 g    Refill:  1   montelukast (SINGULAIR) 10 MG tablet    Sig: TAKE 1 TABLET(10 MG) BY MOUTH AT BEDTIME    Dispense:  90 tablet    Refill:  1   levocetirizine (XYZAL) 5 MG tablet    Sig: Take 1 tablet (5 mg total) by mouth in the morning and at bedtime.    Dispense:  60 tablet    Refill:  5   HYDROCORTISONE, TOPICAL, 2 % LOTN    Sig: Apply 1 Dose topically daily as needed.    Dispense:  59.2 mL    Refill:  1    Problem List Items Addressed This Visit     Allergy    Has flared. Increase Xyzal to bid, Flonase and Astelin daily and refill given on Montelukast      Hyperlipidemia    Encourage heart healthy diet such as MIND or DASH diet, increase exercise, avoid trans fats, simple carbohydrates and processed foods, consider a krill or fish or flaxseed oil cap daily.       Diabetes mellitus type 2 in obese (HCC)  hgba1c acceptable, minimize simple carbs. Increase exercise as tolerated. Continue current meds      Hypertension    Well controlled, no changes to meds. Encouraged heart healthy diet such as the DASH diet and exercise as tolerated.       Dermatitis    No new staph lesions but has significant scarring. Given  Hydrocortisone lotion to try and minimize scarring.      Other Visit Diagnoses     Colon cancer screening    -  Primary   Relevant Orders   Ambulatory referral to Gastroenterology   Need for pneumococcal vaccination       Relevant Orders   Pneumococcal polysaccharide vaccine 23-valent greater than or equal to 2yo subcutaneous/IM (Completed)       Follow-up: Return in about 3 months (around 06/28/2021).  I, Billie Lade, acting as a scribe for Danise Edge, MD, have documented all relevent documentation on behalf of Danise Edge, MD, as directed by Danise Edge, MD while in the presence of Danise Edge, MD.  I, [name], MD personally performed the services described in this documentation. All medical record entries made by the scribe were at my direction and in my presence. I have reviewed the chart and agree that the record reflects my personal performance and is accurate and complete

## 2021-03-28 NOTE — Patient Instructions (Addendum)
[employer]: cleaning tablets for laundry detergent alternative Allergies, Adult An allergy is a condition in which the body's defense system (immune system) comes in contact with an allergen and reacts to it. An allergen is anything that causes an allergic reaction. Allergens cause the immune system to make proteins for fighting infections (antibodies). These antibodies cause cells to release chemicals called histamines that setoff the symptoms of an allergic reaction. Allergies often affect the nasal passages (allergic rhinitis), eyes (allergic conjunctivitis), skin (atopic dermatitis), and stomach. Allergies can be mild, moderate, or severe. They cannot spreadfrom person to person. Allergies can develop at any age and may be outgrown. What are the causes? This condition is caused by allergens. Common allergens include: Outdoor allergens, such as pollen, car fumes, and mold. Indoor allergens, such as dust, smoke, mold, and pet dander. Other allergens, such as foods, medicines, scents, insect bites or stings, and other skin irritants. What increases the risk? You are more likely to develop this condition if you have: Family members with allergies. Family members who have any condition that may be caused by allergens, such as asthma. This may make you more likely to have other allergies. What are the signs or symptoms? Symptoms of this condition depend on the severity of the allergy. Mild to moderate symptoms Runny nose, stuffy nose (nasal congestion), or sneezing. Itchy mouth, ears, or throat. A feeling of mucus dripping down the back of your throat (postnasal drip). Sore throat. Itchy, red, watery, or puffy eyes. Skin rash, or itchy, red, swollen areas of skin (hives). Stomach cramps or bloating. Severe symptoms Severe allergies to food, medicine, or insect bites may cause anaphylaxis, which can be life-threatening. Symptoms include: A red (flushed) face. Wheezing or  coughing. Swollen lips, tongue, or mouth. Tight or swollen throat. Chest pain or tightness, or rapid heartbeat. Trouble breathing or shortness of breath. Pain in the abdomen, vomiting, or diarrhea. Dizziness or fainting. How is this diagnosed? This condition is diagnosed based on your symptoms, your family and medical history, and a physical exam. You may also have tests, including: Skin tests to see how your skin reacts to allergens that may be causing your symptoms. Tests include: Skin prick test. For this test, an allergen is introduced to your body through a small opening in the skin. Intradermal skin test. For this test, a small amount of allergen is injected under the first layer of your skin. Patch test. For this test, a small amount of allergen is placed on your skin. The area is covered and then checked after a few days. Blood tests. A challenge test. For this test, you will eat or breathe in a small amount of allergen to see if you have an allergic reaction. You may also be asked to: Keep a food diary. This is a record of all the foods, drinks, and symptoms you have in a day. Try an elimination diet. To do this: Remove certain foods from your diet. Add those foods back one by one to find out if any foods cause an allergic reaction. How is this treated?     Treatment for allergies depends on your symptoms. Treatment may include: Cold, wet cloths (cold compresses) to soothe itching and swelling. Eye drops or nasal sprays. Nasal irrigation to help clear your mucus or keep the nasal passages moist. A humidifier to add moisture to the air. Skin creams to treat rashes or itching. Oral antihistamines or other medicines to block the reaction or to treat inflammation. Diet changes to  remove foods that cause allergies. Being exposed again and again to tiny amounts of allergens to help you build a defense against it (tolerance). This is called immunotherapy. Examples include: Allergy  shot. You receive an injection that contains an allergen. Sublingual immunotherapy. You take a small dose of allergen under your tongue. Emergency injection for anaphylaxis. You give yourself a shot using a syringe (auto-injector) that contains the amount of medicine you need. Your health care provider will teach you how to give yourself an injection. Follow these instructions at home: Medicines  Take or apply over-the-counter and prescription medicines only as told by your health care provider. Always carry your auto-injector pen if you are at risk of anaphylaxis. Give yourself an injection as told by your health care provider.  Eating and drinking Follow instructions from your health care provider about eating or drinking restrictions. Drink enough fluid to keep your urine pale yellow. General instructions Wear a medical alert bracelet or necklace to let others know that you have had anaphylaxis before. Avoid known allergens whenever possible. Keep all follow-up visits as told by your health care provider. This is important. Contact a health care provider if: Your symptoms do not get better with treatment. Get help right away if: You have symptoms of anaphylaxis. These include: Swollen mouth, tongue, or throat. Pain or tightness in your chest. Trouble breathing or shortness of breath. Dizziness or fainting. Severe abdominal pain, vomiting, or diarrhea. These symptoms may represent a serious problem that is an emergency. Do not wait to see if the symptoms will go away. Get medical help right away. Call your local emergency services (911 in the [place] Do not drive yourself to the hospital. Summary Take or apply over-the-counter and prescription medicines only as told by your health care provider. Avoid known allergens when possible. Always carry your auto-injector pen if you are at risk of anaphylaxis. Give yourself an injection as told by your health care provider. Wear a medical alert  bracelet or necklace to let others know that you have had anaphylaxis before. Anaphylaxis is a life-threatening emergency. Get help right away. This information is not intended to replace advice given to you by your health care provider. Make sure you discuss any questions you have with your healthcare provider. Document Revised: 03/26/2020 Document Reviewed: 06/08/2019 Elsevier Patient Education  2022 [employer].

## 2021-03-31 NOTE — Assessment & Plan Note (Signed)
No new staph lesions but has significant scarring. Given Hydrocortisone lotion to try and minimize scarring.

## 2021-03-31 NOTE — Assessment & Plan Note (Signed)
Has flared. Increase Xyzal to bid, Flonase and Astelin daily and refill given on Montelukast

## 2021-04-01 ENCOUNTER — Telehealth: Payer: Self-pay

## 2021-04-01 NOTE — Telephone Encounter (Signed)
Spoke with patient regarding symptoms. Patient states arm is better, without pain.   [facility] Night - Client TELEPHONE ADVICE RECORD [facility] Patient Name: Nicole Ball Gender: Female DOB: 04-07-1976 Age: 45 Y 9 M 30 D Return Phone Number: [phone](Primary),([phone](Secondary) [facility] - Client Client Site [facility] - Night Physician Danise Edge - MD Contact Type Call Who Is Calling Patient / Member / Family / Caregiver Call Type Triage / Clinical Relationship To Patient Self Return Phone Number [phone] (Primary) Chief Complaint Medication reaction Reason for Call Symptomatic / Request for Health Information Initial Comment Got pneumonia shot on Wed., site is warm, swollen and very sore. Translation No Nurse Assessment Nurse: Lorin Picket, RN, Elnita Maxwell Date/Time (Eastern Time): 03/30/2021 11:00:51 AM Confirm and document reason for call. If symptomatic, describe symptoms. ---Caller states she received her PNA vaccine last Wednesday, injection site in R arm is swollen, warm and very sore, rates pain 5/10, has not taken anything for pain, denies other symptoms, no known exp to covid in last 14 days, Does the patient have any new or worsening symptoms? ---Yes Will a triage be completed? ---Yes Related visit to physician within the last 2 weeks? ---Yes Does the PT have any chronic conditions? (i.e. diabetes, asthma, this includes High risk factors for pregnancy, etc.) ---Yes List chronic conditions. ---DM HTN Is the patient pregnant or possibly pregnant? (Ask all females between the ages of 7-55) ---No Is this a behavioral health or substance abuse call? ---No Guidelines Guideline Title Affirmed Question Affirmed Notes Nurse Date/Time (Eastern Time) Immunization Reactions [1] Over 3 days (72 hours) since shot AND [2] redness, swelling or pain getting worse Lorin Picket, RN, Elnita Maxwell  03/30/2021 11:03:26 AM PLEASE NOTE: All timestamps contained within this report are represented as [place] Standard Time. CONFIDENTIALTY NOTICE: This fax transmission is intended only for the addressee. It contains information that is legally privileged, confidential or otherwise protected from use or disclosure. If you are not the intended recipient, you are strictly prohibited from reviewing, disclosing, copying using or disseminating any of this information or taking any action in reliance on or regarding this information. If you have received this fax in error, please notify us immediately by telephone so that we can arrange for its return to [place]. Phone: [phone], Toll-Free: [phone], Fax: [phone] Page: 2 of 2 Call Id: [id number] Disp. Time Lamount Cohen Time) Disposition Final User 03/30/2021 10:35:54 AM Attempt made - message left Wilford Grist 03/30/2021 11:06:18 AM Call PCP within 24 Hours Yes Lorin Picket, RN, Elizabeth Sauer Disagree/Comply Comply Caller Understands Yes PreDisposition Call Doctor Care Advice Given Per Guideline CALL PCP WITHIN 24 HOURS: * You need to discuss this with your doctor (or NP/PA) within the next 24 hours. PAIN MEDICINES: * For pain relief, you can take either acetaminophen, ibuprofen, or naproxen. PAIN MEDICINES - EXTRA NOTES AND WARNINGS: * Fever occurs * You become worse CALL BACK IF: Referrals GO TO FACILITY UNDECIDED

## 2021-04-03 ENCOUNTER — Telehealth: Payer: Self-pay

## 2021-04-03 ENCOUNTER — Other Ambulatory Visit: Payer: Self-pay | Admitting: Family Medicine

## 2021-04-03 MED ORDER — ALPRAZOLAM 0.5 MG PO TABS: 0.5000 mg | ORAL_TABLET | Freq: Two times a day (BID) | ORAL | 0 refills | Status: DC | PRN

## 2021-04-03 NOTE — Telephone Encounter (Signed)
Patient requesting refill of Xanax last rx per Dr. Abner Greenspan was for 0.5 mg sent May 2022.  Patient reports she was seeing Dr. Javier Glazier and he is now retired, she reports she was getting 1.0 from him for anxiety and will like to get 1.0 if possible. She was out of work and now back to work and is not doing well with anxiety symptoms. She uses [facility] on [address].

## 2021-04-04 NOTE — Telephone Encounter (Signed)
Patient advised of the provider's comments and medication refill. She will be scheduled to have a virtual visit with provider tomorrow.

## 2021-04-05 ENCOUNTER — Other Ambulatory Visit: Payer: Self-pay

## 2021-04-05 ENCOUNTER — Telehealth (INDEPENDENT_AMBULATORY_CARE_PROVIDER_SITE_OTHER): Payer: 59 | Admitting: Family Medicine

## 2021-04-05 DIAGNOSIS — F411 Generalized anxiety disorder: Secondary | ICD-10-CM

## 2021-04-05 DIAGNOSIS — U099 Post covid-19 condition, unspecified: Secondary | ICD-10-CM | POA: Diagnosis not present

## 2021-04-05 DIAGNOSIS — I10 Essential (primary) hypertension: Secondary | ICD-10-CM

## 2021-04-05 DIAGNOSIS — E1169 Type 2 diabetes mellitus with other specified complication: Secondary | ICD-10-CM

## 2021-04-05 DIAGNOSIS — M5441 Lumbago with sciatica, right side: Secondary | ICD-10-CM

## 2021-04-05 DIAGNOSIS — E669 Obesity, unspecified: Secondary | ICD-10-CM

## 2021-04-05 DIAGNOSIS — T7840XD Allergy, unspecified, subsequent encounter: Secondary | ICD-10-CM

## 2021-04-05 DIAGNOSIS — F418 Other specified anxiety disorders: Secondary | ICD-10-CM | POA: Insufficient documentation

## 2021-04-05 MED ORDER — ALPRAZOLAM 1 MG PO TABS: 1.0000 mg | ORAL_TABLET | Freq: Two times a day (BID) | ORAL | 1 refills | Status: DC | PRN

## 2021-04-05 MED ORDER — HYDROCODONE-ACETAMINOPHEN 7.5-325 MG PO TABS: 1.0000 | ORAL_TABLET | Freq: Four times a day (QID) | ORAL | 0 refills | Status: DC | PRN

## 2021-04-05 NOTE — Assessment & Plan Note (Signed)
Struggling with increased myalgias, arthralgias. Encouraged to attempt to increase stretching and movement as able and to use Hydrocodone sparingly. Encouraged moist heat and gentle stretching as tolerated. May try NSAIDs and prescription meds as directed and report if symptoms worsen or seek immediate care

## 2021-04-05 NOTE — Progress Notes (Signed)
MyChart Video Visit    Virtual Visit via Video Note   This visit type was conducted due to national recommendations for restrictions regarding the COVID-19 Pandemic (e.g. social distancing) in an effort to limit this patient's exposure and mitigate transmission in our community. This patient is at least at moderate risk for complications without adequate follow up. This format is felt to be most appropriate for this patient at this time. Physical exam was limited by quality of the video and audio technology used for the visit. S Chism, CMA was able to get the patient set up on a video visit.  Patient location: home Patient and provider in visit Provider location: Office  I discussed the limitations of evaluation and management by telemedicine and the availability of in person appointments. The patient expressed understanding and agreed to proceed.  Visit Date: 04/05/2021  Today's healthcare provider: Danise EdgeStacey Maleta Pacha, MD     Subjective:    Patient ID: Nicole Ball, female    DOB: Feb 19, 1976, 45 y.o.   MRN: [id number]  Chief Complaint  Patient presents with   Anxiety    HPI Patient is in today for evaluation of anxiety and depression. No recent febrile illness or hospitalizations. She has multiple stressors including trying to reconcile with her husband, returning to work on campus, chronic pain and fatigue after COVID etc. She had a psychiatrist at [facility] but he left and she has not been able to get a new provider. 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 She does not use it on a daily basis. She had been discussing changing her Prozac to something else with her psychiatrist but had not done so. She notes increased headaches, bodyaches, fatigue since her last COVID infection. She uses her pain med for HA, back and lower extremity pain prn. Denies CP/palp/SOB/HA/congestion/fevers/GI or GU c/o. Taking meds as prescribed    Past Medical History:  Diagnosis Date   Abnormal serum level of alkaline phosphatase 05/21/2014   Acute low back pain 12/19/2010   ALLERGIC RHINITIS 10/16/2010   Anxiety and depression 06/07/2011   Atrophic vaginitis 08/17/2016   Cervical cancer screening 01/10/2014   Menarche at 8 Regular and moderate flow, became irregular until OCPs  history of abnormal pap in past, repeat was normal Last pap roughly 3 years ago G0P0, s/p No history of abnormal MGM, no h/o MGM  No concerns today no gyn surgeries LMP May 2013   CTS (carpal tunnel syndrome) 01/15/2014   Diabetes mellitus type 2 in obese (HCC) 10/30/2011   Dry eyes 08/17/2016   Early menopause    Essential hypertension    HIATAL HERNIA WITH REFLUX, HX OF 10/16/2010   Hyperlipidemia 04/21/2011   Inappropriate sinus tachycardia    INSOMNIA 10/16/2010   Knee pain, left 03/11/2012   Migraine 03/07/2013   Morbid obesity (HCC)    Palpitations 01/28/2015   Reflux 04/22/2012   Sleep apnea 06/07/2011   Tobacco use disorder 02/10/2017   Urinary urgency 08/17/2016   UTI'S, HX OF 10/16/2010    Past Surgical History:  Procedure Laterality Date   CHOLECYSTECTOMY      Family History  Problem Relation Age of Onset   Lupus Mother    Arthritis Maternal Grandmother    Scleroderma Maternal Grandmother    Glaucoma Maternal Grandmother    Cataracts Maternal Grandmother    Cancer Maternal Grandfather        prostate   Coronary  artery disease Maternal Grandfather        s/p angioplasty   Diabetes Maternal Grandfather        Type 2   Cancer Paternal Grandmother        Breast ca- dx in 50's   Thyroid disease Paternal Grandmother    Breast cancer Paternal Grandmother    Diabetes Paternal Grandfather    Thyroid disease Sister    Asthma Sister    Allergies Brother    Cancer Father        cigarette, alcohol HEENT squamous cell   Alcohol abuse Father    Arthritis Other    Alcohol abuse Maternal Aunt    Alcohol abuse Maternal Uncle     Social History    Socioeconomic History   Marital status: Married    Spouse name: Not on file   Number of children: Not on file   Years of education: Not on file   Highest education level: Not on file  Occupational History   Not on file  Tobacco Use   Smoking status: Some Days    Packs/day: 0.10    Types: Cigars, Cigarettes   Smokeless tobacco: Never   Tobacco comments:    1 black and milds  Vaping Use   Vaping Use: Never used  Substance and Sexual Activity   Alcohol use: Yes    Comment: twice/year   Drug use: No   Sexual activity: Not on file  Other Topics Concern   Not on file  Social History Narrative   Married lives w/ husband, works at [employer]   Social Determinants of Corporate investment banker Strain: Not on file  Food Insecurity: Not on file  Transportation Needs: Not on file  Physical Activity: Not on file  Stress: Not on file  Social Connections: Not on file  Intimate Partner Violence: Not on file    Outpatient Medications Prior to Visit  Medication Sig Dispense Refill   ACETAMINOPHEN-BUTALBITAL 50-325 MG TABS Take 1 tablet by mouth 2 (two) times daily as needed. 60 tablet 0   albuterol (VENTOLIN HFA) 108 (90 Base) MCG/ACT inhaler Inhale 2 puffs into the lungs every 6 (six) hours as needed for wheezing or shortness of breath. 18 g 5   amphetamine-dextroamphetamine (ADDERALL XR) 30 MG 24 hr capsule Take 2 capsules (60 mg total) by mouth every morning. October 2022 60 capsule 0   amphetamine-dextroamphetamine (ADDERALL XR) 30 MG 24 hr capsule Take 2 capsules (60 mg total) by mouth every morning. September 2022 60 capsule 0   amphetamine-dextroamphetamine (ADDERALL XR) 30 MG 24 hr capsule Take 2 capsules (60 mg total) by mouth every morning. August 2022 60 capsule 0   azelastine (ASTELIN) 0.1 % nasal spray Place 2 sprays into both nostrils 2 (two) times daily. Use in each nostril as directed 30 mL 12   baclofen (LIORESAL) 10 MG tablet Take 1 tablet (10 mg total) by mouth  3 (three) times daily. 30 tablet 0   BLACK COHOSH PO Take 1 tablet by mouth daily.     celecoxib (CELEBREX) 200 MG capsule One to 2 tablets by mouth daily as needed for pain. 60 capsule 2   conjugated estrogens (PREMARIN) vaginal cream Apply small amount to affected area twice a week 42.5 g 1   FLUoxetine (PROZAC) 20 MG capsule Take 3 capsules (60 mg total) by mouth daily. 90 capsule 3   fluticasone (FLONASE) 50 MCG/ACT nasal spray SHAKE LIQUID AND USE 2 SPRAYS IN EACH NOSTRIL DAILY  16 g 1   fluticasone (FLOVENT HFA) 220 MCG/ACT inhaler Inhale 1 puff into the lungs in the morning and at bedtime. 1 each 1   gabapentin (NEURONTIN) 600 MG tablet Take 1 tablet (600 mg total) by mouth 3 (three) times daily. 90 tablet 0   Ginger, Zingiber officinalis, (GINGER PO) Take 1 tablet by mouth daily.     glimepiride (AMARYL) 2 MG tablet Take 1 tablet (2 mg total) by mouth daily with breakfast. 90 tablet 1   glucose blood (ONETOUCH VERIO) test strip 1 each by Other route daily in the afternoon. Use as instructed 100 each 6   HYDROCORTISONE, TOPICAL, 2 % LOTN Apply 1 Dose topically daily as needed. 59.2 mL 1   hyoscyamine (LEVSIN SL) 0.125 MG SL tablet Take 1 tablet (0.125 mg total) by mouth every 4 (four) hours as needed. 30 tablet 0   levocetirizine (XYZAL) 5 MG tablet Take 1 tablet (5 mg total) by mouth in the morning and at bedtime. 60 tablet 5   losartan (COZAAR) 50 MG tablet TAKE 1 TABLET(50 MG) BY MOUTH DAILY PT OVERDUE FOR AN APPT. 1ST ATTEMPT. PLEASE HAVE PT CALL TO SCHEDULE.  CAN AUTHORIZE 30 DAYS 30 tablet 0   metFORMIN (GLUCOPHAGE) 500 MG tablet Take 2 tablets (1,000 mg total) by mouth 2 (two) times daily. 360 tablet 3   montelukast (SINGULAIR) 10 MG tablet TAKE 1 TABLET(10 MG) BY MOUTH AT BEDTIME 90 tablet 1   mupirocin cream (BACTROBAN) 2 % Apply via clean qtip to nares qhs 30 g 1   omeprazole (PRILOSEC) 20 MG capsule TAKE 1 CAPSULE(20 MG) BY MOUTH TWICE DAILY BEFORE A MEAL 180 capsule 3    ondansetron (ZOFRAN) 4 MG tablet TAKE 1 TABLET(4 MG) BY MOUTH EVERY 8 HOURS AS NEEDED FOR NAUSEA OR VOMITING 40 tablet 1   Probiotic Product (PROBIOTIC PO) Take 1 tablet by mouth daily.     promethazine (PHENERGAN) 25 MG tablet TAKE 1 TABLET(25 MG) BY MOUTH EVERY 8 HOURS AS NEEDED FOR NAUSEA OR VOMITING 30 tablet 1   propranolol (INDERAL) 60 MG tablet TAKE 1 TABLET(60 MG) BY MOUTH TWICE DAILY. Please make yearly appt with Dr. Shari Prows (Cardiologist) for September 2022 before anymore refills. Thank you 1st attempt 180 tablet 0   rosuvastatin (CRESTOR) 5 MG tablet Take 1 tablet (5 mg total) by mouth 3 (three) times a week. 36 tablet 3   sodium chloride (OCEAN) 0.65 % SOLN nasal spray Place 1 spray into both nostrils daily.     SUMAtriptan (IMITREX) 50 MG tablet TAKE 1 TABLET BY MOUTH EVERY 2 HOURS AS NEEDED FOR MIGRAINE. MAY REPEAT IN 2 HOURS IF HEADACHE PERSISTS OR RECURS 10 tablet 3   ALPRAZolam (XANAX) 0.5 MG tablet Take 1 tablet (0.5 mg total) by mouth 2 (two) times daily as needed for anxiety or sleep. 30 tablet 0   baclofen (LIORESAL) 10 MG tablet Take 1 tablet (10 mg total) by mouth 2 (two) times daily as needed for muscle spasms. 30 each 2   fluconazole (DIFLUCAN) 150 MG tablet Take 1 tablet (150 mg total) by mouth once a week. 4 tablet 1   HYDROcodone-acetaminophen (NORCO) 7.5-325 MG tablet Take 1 tablet by mouth every 6 (six) hours as needed for moderate pain. 100 tablet 0   zolpidem (AMBIEN) 10 MG tablet Take 1 tablet (10 mg total) by mouth at bedtime as needed for sleep. 30 tablet 2   No facility-administered medications prior to visit.    No Known Allergies  ROS     Objective:    Physical Exam  LMP 01/08/2013  Wt Readings from Last 3 Encounters:  03/28/21 179 lb (81.2 kg)  03/05/21 180 lb (81.6 kg)  11/05/20 185 lb 9.6 oz (84.2 kg)    Diabetic Foot Exam - Simple   No data filed    Lab Results  Component Value Date   WBC 6.0 12/10/2020   HGB 12.0 12/10/2020   HCT  36.8 12/10/2020   PLT 231.0 12/10/2020   GLUCOSE 161 (H) 12/10/2020   CHOL 179 12/10/2020   TRIG 134.0 12/10/2020   HDL 75.90 12/10/2020   LDLCALC 76 12/10/2020   ALT 18 12/10/2020   AST 13 12/10/2020   NA 137 12/10/2020   K 3.7 12/10/2020   CL 100 12/10/2020   CREATININE 0.68 12/10/2020   BUN 10 12/10/2020   CO2 26 12/10/2020   TSH 0.82 12/10/2020   HGBA1C 8.5 (A) 03/05/2021   MICROALBUR <0.7 09/17/2015    Lab Results  Component Value Date   TSH 0.82 12/10/2020   Lab Results  Component Value Date   WBC 6.0 12/10/2020   HGB 12.0 12/10/2020   HCT 36.8 12/10/2020   MCV 88.9 12/10/2020   PLT 231.0 12/10/2020   Lab Results  Component Value Date   NA 137 12/10/2020   K 3.7 12/10/2020   CO2 26 12/10/2020   GLUCOSE 161 (H) 12/10/2020   BUN 10 12/10/2020   CREATININE 0.68 12/10/2020   BILITOT 0.3 12/10/2020   ALKPHOS 139 (H) 12/10/2020   AST 13 12/10/2020   ALT 18 12/10/2020   PROT 7.5 12/10/2020   ALBUMIN 4.4 12/10/2020   CALCIUM 10.2 12/10/2020   ANIONGAP 10 09/13/2020   GFR 104.36 12/10/2020   Lab Results  Component Value Date   CHOL 179 12/10/2020   Lab Results  Component Value Date   HDL 75.90 12/10/2020   Lab Results  Component Value Date   LDLCALC 76 12/10/2020   Lab Results  Component Value Date   TRIG 134.0 12/10/2020   Lab Results  Component Value Date   CHOLHDL 2 12/10/2020   Lab Results  Component Value Date   HGBA1C 8.5 (A) 03/05/2021       Assessment & Plan:   Problem List Items Addressed This Visit     Allergy    She notes increased sinu pressure and headaches recently. No fevers, chills, signs of acute infection. Use daily antihistamines and Flonase.       GAD (generalized anxiety disorder)    Her psychiatrist retired and [facility] was unable to set her up with a new provider. She uses Alprazolam infrequently and is encouraged to continue infrequent use because while it might help an anxiety attack it can make depression and  fatigue worse. She agrees. She is given a refill on Alprazolam 1 mg prescription to use sparingly      Relevant Medications   ALPRAZolam (XANAX) 1 MG tablet   Diabetes mellitus type 2 in obese (HCC)    hgba1c acceptable, minimize simple carbs. Increase exercise as tolerated.       Hypertension    Monitor and report any concerns, no changes to meds. Encouraged heart healthy diet such as the DASH diet and exercise as tolerated.       Back pain    Struggling with increased myalgias, arthralgias. Encouraged to attempt to increase stretching and movement as able and to use Hydrocodone sparingly. Encouraged moist heat and gentle stretching as tolerated. 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 She will let us know      Relevant Medications   ALPRAZolam (XANAX) 1 MG tablet    I have discontinued Nicole Ball's fluconazole and ALPRAZolam. I am also having her start on ALPRAZolam. Additionally, I am having her maintain her sodium chloride, Premarin, BLACK COHOSH PO, (Ginger, Zingiber officinalis, (GINGER PO)), Probiotic Product (PROBIOTIC PO), celecoxib, rosuvastatin, omeprazole, zolpidem, Flovent HFA, ondansetron, mupirocin cream, baclofen, SUMAtriptan, albuterol, promethazine, propranolol, losartan, hyoscyamine, ACETAMINOPHEN-BUTALBITAL, gabapentin, metFORMIN, glimepiride, OneTouch Verio, FLUoxetine, amphetamine-dextroamphetamine, amphetamine-dextroamphetamine, amphetamine-dextroamphetamine, azelastine, fluticasone, montelukast, levocetirizine, HYDROCORTISONE (TOPICAL), and HYDROcodone-acetaminophen.  Meds ordered this  encounter  Medications   ALPRAZolam (XANAX) 1 MG tablet    Sig: Take 1 tablet (1 mg total) by mouth 2 (two) times daily as needed for anxiety.    Dispense:  30 tablet    Refill:  1   HYDROcodone-acetaminophen (NORCO) 7.5-325 MG tablet    Sig: Take 1 tablet by mouth every 6 (six) hours as needed for moderate pain.    Dispense:  100 tablet    Refill:  0    I discussed the assessment and treatment plan with the patient. The patient was provided an opportunity to ask questions and all were answered. The patient agreed with the plan and demonstrated an understanding of the instructions.   The patient was advised to call back or seek an in-person evaluation if the symptoms worsen or if the condition fails to improve as anticipated.  I provided 35 minutes of face-to-face time during this encounter.   Danise Edge, MD [facility] [phone] (phone) [phone] (fax)  [facility]

## 2021-04-05 NOTE — Assessment & Plan Note (Signed)
She notes increased sinu pressure and headaches recently. No fevers, chills, signs of acute infection. Use daily antihistamines and Flonase.

## 2021-04-05 NOTE — Assessment & Plan Note (Signed)
Her psychiatrist retired and [facility] was unable to set her up with a new provider. She uses Alprazolam infrequently and is encouraged to continue infrequent use because while it might help an anxiety attack it can make depression and fatigue worse. She agrees. She is given a refill on Alprazolam 1 mg prescription to use sparingly

## 2021-04-05 NOTE — Assessment & Plan Note (Signed)
Monitor and report any concerns, no changes to meds. Encouraged heart healthy diet such as the DASH diet and exercise as tolerated.  ?

## 2021-04-05 NOTE — Assessment & Plan Note (Signed)
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 She will let us know

## 2021-04-05 NOTE — Assessment & Plan Note (Signed)
Still struggling with fatigue and headaches

## 2021-04-05 NOTE — Assessment & Plan Note (Signed)
hgba1c acceptable, minimize simple carbs. Increase exercise as tolerated.  

## 2021-04-08 ENCOUNTER — Other Ambulatory Visit: Payer: Self-pay | Admitting: Family Medicine

## 2021-05-10 ENCOUNTER — Other Ambulatory Visit: Payer: Self-pay | Admitting: Family Medicine

## 2021-05-10 ENCOUNTER — Other Ambulatory Visit: Payer: Self-pay | Admitting: Family

## 2021-05-13 MED ORDER — HYDROCODONE-ACETAMINOPHEN 7.5-325 MG PO TABS: 1.0000 | ORAL_TABLET | Freq: Four times a day (QID) | ORAL | 0 refills | Status: DC | PRN

## 2021-05-13 MED ORDER — BACLOFEN 10 MG PO TABS: 10.0000 mg | ORAL_TABLET | Freq: Three times a day (TID) | ORAL | 0 refills | Status: DC

## 2021-05-13 NOTE — Telephone Encounter (Signed)
Requesting:norco 7.5-325 mg Contract:unknown UDS:12/10/20 Last Visit:04/05/21 Next Visit:06/18/22 Last Refill:04/05/21  Please Advise

## 2021-05-13 NOTE — Telephone Encounter (Signed)
Requesting: hydrocodone 7.5-325mg  Contract: 09/10/2017 UDS: 12/10/2020 Last Visit:04/05/2021 Next Visit: 06/18/2021 Last Refill: 04/05/2021 #100 and 0RF Pt sig: 1 tab q6h prn  Please Advise

## 2021-05-19 ENCOUNTER — Other Ambulatory Visit: Payer: Self-pay | Admitting: Family Medicine

## 2021-05-19 DIAGNOSIS — R000 Tachycardia, unspecified: Secondary | ICD-10-CM

## 2021-05-19 DIAGNOSIS — I10 Essential (primary) hypertension: Secondary | ICD-10-CM

## 2021-05-19 DIAGNOSIS — E782 Mixed hyperlipidemia: Secondary | ICD-10-CM

## 2021-05-31 ENCOUNTER — Other Ambulatory Visit: Payer: Self-pay | Admitting: Family Medicine

## 2021-05-31 ENCOUNTER — Telehealth: Payer: Self-pay

## 2021-05-31 MED ORDER — AMPHETAMINE-DEXTROAMPHET ER 30 MG PO CP24: 60.0000 mg | ORAL_CAPSULE | ORAL | 0 refills | Status: DC

## 2021-05-31 NOTE — Telephone Encounter (Signed)
Pt aware.

## 2021-05-31 NOTE — Telephone Encounter (Signed)
Pt requesting refill on Adderall to be sent to [facility] on [address].

## 2021-06-03 NOTE — Telephone Encounter (Signed)
Pt states adderal is on back order at her [facility], she would like to know if it can be sent to [facility] off [address].

## 2021-06-04 ENCOUNTER — Other Ambulatory Visit: Payer: Self-pay | Admitting: Family Medicine

## 2021-06-04 MED ORDER — AMPHETAMINE-DEXTROAMPHET ER 30 MG PO CP24: 60.0000 mg | ORAL_CAPSULE | ORAL | 0 refills | Status: DC

## 2021-06-06 ENCOUNTER — Telehealth: Payer: Self-pay | Admitting: Family Medicine

## 2021-06-06 NOTE — Telephone Encounter (Signed)
Pt. Called and stated that she called [facility] yesterday and they haven't gotten rx  Medication:  amphetamine-dextroamphetamine (ADDERALL XR) 30 MG 24 hr  Has the patient contacted their pharmacy? Yes.   (If no, request that the patient contact the pharmacy for the refill.) (If yes, when and what did the pharmacy advise?)  Preferred Pharmacy (with phone number or street name): [id number] - [place], [place] - [address]  [address] [place] [address]  Phone:  [phone]  Fax:  [phone]   Agent: Please be advised that RX refills may take up to 3 business days. We ask that you follow-up with your pharmacy.

## 2021-06-07 ENCOUNTER — Other Ambulatory Visit: Payer: Self-pay | Admitting: Family Medicine

## 2021-06-07 NOTE — Telephone Encounter (Signed)
Requesting: Adderall XR 30mg  Contract:   UDS: 12/10/20 Last Visit:04/05/21 Next Visit: 06/18/21 Last Refill:  06/04/21  Please Advise

## 2021-06-07 NOTE — Telephone Encounter (Signed)
Patient notified that rx was sent on 06/04/21.  Patient stated that she had just picked it up.

## 2021-06-18 ENCOUNTER — Other Ambulatory Visit: Payer: Self-pay

## 2021-06-18 ENCOUNTER — Ambulatory Visit (INDEPENDENT_AMBULATORY_CARE_PROVIDER_SITE_OTHER): Payer: 59 | Admitting: Family Medicine

## 2021-06-18 ENCOUNTER — Encounter: Payer: Self-pay | Admitting: Family Medicine

## 2021-06-18 VITALS — BP 124/72 | HR 108 | Temp 97.7°F | Resp 16 | Ht 63.0 in | Wt 179.6 lb

## 2021-06-18 DIAGNOSIS — E782 Mixed hyperlipidemia: Secondary | ICD-10-CM

## 2021-06-18 DIAGNOSIS — E1169 Type 2 diabetes mellitus with other specified complication: Secondary | ICD-10-CM

## 2021-06-18 DIAGNOSIS — Z1231 Encounter for screening mammogram for malignant neoplasm of breast: Secondary | ICD-10-CM

## 2021-06-18 DIAGNOSIS — I10 Essential (primary) hypertension: Secondary | ICD-10-CM | POA: Diagnosis not present

## 2021-06-18 DIAGNOSIS — Z124 Encounter for screening for malignant neoplasm of cervix: Secondary | ICD-10-CM | POA: Diagnosis not present

## 2021-06-18 DIAGNOSIS — B958 Unspecified staphylococcus as the cause of diseases classified elsewhere: Secondary | ICD-10-CM

## 2021-06-18 DIAGNOSIS — R748 Abnormal levels of other serum enzymes: Secondary | ICD-10-CM | POA: Diagnosis not present

## 2021-06-18 DIAGNOSIS — M5441 Lumbago with sciatica, right side: Secondary | ICD-10-CM

## 2021-06-18 DIAGNOSIS — F909 Attention-deficit hyperactivity disorder, unspecified type: Secondary | ICD-10-CM

## 2021-06-18 DIAGNOSIS — Z0000 Encounter for general adult medical examination without abnormal findings: Secondary | ICD-10-CM | POA: Diagnosis not present

## 2021-06-18 DIAGNOSIS — E669 Obesity, unspecified: Secondary | ICD-10-CM | POA: Diagnosis not present

## 2021-06-18 DIAGNOSIS — Z23 Encounter for immunization: Secondary | ICD-10-CM | POA: Diagnosis not present

## 2021-06-18 DIAGNOSIS — Z1211 Encounter for screening for malignant neoplasm of colon: Secondary | ICD-10-CM | POA: Diagnosis not present

## 2021-06-18 MED ORDER — AMPHETAMINE-DEXTROAMPHET ER 30 MG PO CP24: 60.0000 mg | ORAL_CAPSULE | ORAL | 0 refills | Status: DC

## 2021-06-18 MED ORDER — ONDANSETRON HCL 4 MG PO TABS: ORAL_TABLET | ORAL | 1 refills | Status: DC

## 2021-06-18 MED ORDER — VENLAFAXINE HCL ER 37.5 MG PO CP24: ORAL_CAPSULE | ORAL | 1 refills | Status: DC

## 2021-06-18 MED ORDER — ALPRAZOLAM 1 MG PO TABS
1.0000 mg | ORAL_TABLET | Freq: Two times a day (BID) | ORAL | 1 refills | Status: DC | PRN
Start: 2021-06-18 — End: 2021-10-07

## 2021-06-18 MED ORDER — FLUOXETINE HCL 20 MG PO CAPS: ORAL_CAPSULE | ORAL | 3 refills | Status: DC

## 2021-06-18 MED ORDER — METFORMIN HCL 500 MG PO TABS: 1000.0000 mg | ORAL_TABLET | Freq: Two times a day (BID) | ORAL | 3 refills | Status: DC

## 2021-06-18 MED ORDER — FLUTICASONE PROPIONATE 50 MCG/ACT NA SUSP: NASAL | 3 refills | Status: DC

## 2021-06-18 MED ORDER — ZOLPIDEM TARTRATE 10 MG PO TABS: 10.0000 mg | ORAL_TABLET | Freq: Every evening | ORAL | 2 refills | Status: DC | PRN

## 2021-06-18 NOTE — Progress Notes (Signed)
Patient ID: Nicole Ball, female    DOB: Dec 25, 1975  Age: 45 y.o. MRN: [id number]    Subjective:   Chief Complaint  Patient presents with   Annual Exam   Subjective   HPI Nicole Ball presents for office visit today for comprehensive physical exam today and follow up on management of chronic concerns. She expresses interest in getting another epidural shot due  She reports that the numbness and tingling is coming back in her right LE and right UE. She was px gabapentin 600 mg 3x daily and initially before symptoms she was working on titrating down dosage, but at the moment she will continue current dosage.  About 2 months ago she fell and injured her left knee. When she missed doses of Cymbalta, she felt horrible. As a result she expresses interest in trying on Venlafaxine. Denies CP/palp/SOB/HA/congestion/fevers/GI or GU c/o. Taking meds as prescribed.    She might have developed a new food allergy. Her lips and inside her mouth swells. Taking benadryl helps bring swelling down.  Review of Systems  Constitutional:  Negative for chills, fatigue and fever.  HENT:  Negative for congestion, rhinorrhea, sinus pressure, sinus pain, sore throat and trouble swallowing.   Eyes:  Negative for pain.  Respiratory:  Negative for cough and shortness of breath.   Cardiovascular:  Negative for chest pain, palpitations and leg swelling.  Gastrointestinal:  Negative for abdominal pain, blood in stool, diarrhea, nausea and vomiting.  Genitourinary:  Negative for decreased urine volume, flank pain, frequency, vaginal bleeding and vaginal discharge.  Musculoskeletal:  Negative for back pain.  Neurological:  Negative for headaches.  Psychiatric/Behavioral:  The patient is nervous/anxious.    History Past Medical History:  Diagnosis Date   Abnormal serum level of alkaline phosphatase 05/21/2014   Acute low back pain 12/19/2010   ALLERGIC RHINITIS 10/16/2010   Anxiety and depression 06/07/2011    Atrophic vaginitis 08/17/2016   Cervical cancer screening 01/10/2014   Menarche at 8 Regular and moderate flow, became irregular until OCPs  history of abnormal pap in past, repeat was normal Last pap roughly 3 years ago G0P0, s/p No history of abnormal MGM, no h/o MGM  No concerns today no gyn surgeries LMP May 2013   CTS (carpal tunnel syndrome) 01/15/2014   Diabetes mellitus type 2 in obese (HCC) 10/30/2011   Dry eyes 08/17/2016   Early menopause    Essential hypertension    HIATAL HERNIA WITH REFLUX, HX OF 10/16/2010   Hyperlipidemia 04/21/2011   Inappropriate sinus tachycardia    INSOMNIA 10/16/2010   Knee pain, left 03/11/2012   Migraine 03/07/2013   Morbid obesity (HCC)    Palpitations 01/28/2015   Reflux 04/22/2012   Sleep apnea 06/07/2011   Tobacco use disorder 02/10/2017   Urinary urgency 08/17/2016   UTI'S, HX OF 10/16/2010    She has a past surgical history that includes Cholecystectomy.   Her family history includes Alcohol abuse in her father, maternal aunt, and maternal uncle; Allergies in her brother; Arthritis in her maternal grandmother and another family member; Asthma in her sister; Breast cancer in her paternal grandmother; Cancer in her father, maternal grandfather, and paternal grandmother; Cataracts in her maternal grandmother; Coronary artery disease in her maternal grandfather; Diabetes in her maternal grandfather and paternal grandfather; Glaucoma in her maternal grandmother; Lung disease in her mother; Lupus in her mother; Scleroderma in her maternal grandmother; Thyroid disease in her paternal grandmother and sister.She reports that she has been smoking cigars  and cigarettes. She has been smoking an average of .1 packs per day. She has never used smokeless tobacco. She reports current alcohol use. She reports that she does not use drugs.  Current Outpatient Medications on File Prior to Visit  Medication Sig Dispense Refill   ACETAMINOPHEN-BUTALBITAL 50-325 MG TABS Take 1 tablet  by mouth 2 (two) times daily as needed. 60 tablet 0   albuterol (VENTOLIN HFA) 108 (90 Base) MCG/ACT inhaler Inhale 2 puffs into the lungs every 6 (six) hours as needed for wheezing or shortness of breath. 18 g 5   azelastine (ASTELIN) 0.1 % nasal spray Place 2 sprays into both nostrils 2 (two) times daily. Use in each nostril as directed 30 mL 12   baclofen (LIORESAL) 10 MG tablet Take 1 tablet (10 mg total) by mouth 3 (three) times daily. 30 tablet 0   BLACK COHOSH PO Take 1 tablet by mouth daily.     celecoxib (CELEBREX) 200 MG capsule One to 2 tablets by mouth daily as needed for pain. 60 capsule 2   conjugated estrogens (PREMARIN) vaginal cream Apply small amount to affected area twice a week 42.5 g 1   fluticasone (FLOVENT HFA) 220 MCG/ACT inhaler Inhale 1 puff into the lungs in the morning and at bedtime. 1 each 1   gabapentin (NEURONTIN) 600 MG tablet Take 1 tablet (600 mg total) by mouth 3 (three) times daily. 90 tablet 0   Ginger, Zingiber officinalis, (GINGER PO) Take 1 tablet by mouth daily.     glimepiride (AMARYL) 2 MG tablet Take 1 tablet (2 mg total) by mouth daily with breakfast. 90 tablet 1   glucose blood (ONETOUCH VERIO) test strip 1 each by Other route daily in the afternoon. Use as instructed 100 each 6   HYDROCORTISONE, TOPICAL, 2 % LOTN Apply 1 Dose topically daily as needed. 59.2 mL 1   hyoscyamine (LEVSIN SL) 0.125 MG SL tablet Take 1 tablet (0.125 mg total) by mouth every 4 (four) hours as needed. 30 tablet 0   levocetirizine (XYZAL) 5 MG tablet Take 1 tablet (5 mg total) by mouth in the morning and at bedtime. 60 tablet 5   montelukast (SINGULAIR) 10 MG tablet TAKE 1 TABLET(10 MG) BY MOUTH AT BEDTIME 90 tablet 1   omeprazole (PRILOSEC) 20 MG capsule TAKE 1 CAPSULE(20 MG) BY MOUTH TWICE DAILY BEFORE A MEAL 180 capsule 3   Probiotic Product (PROBIOTIC PO) Take 1 tablet by mouth daily.     promethazine (PHENERGAN) 25 MG tablet TAKE 1 TABLET(25 MG) BY MOUTH EVERY 8 HOURS AS  NEEDED FOR NAUSEA OR VOMITING 30 tablet 1   propranolol (INDERAL) 60 MG tablet TAKE 1 TABLET(60 MG) BY MOUTH TWICE DAILY. Please make yearly appt with Dr. Shari Prows (Cardiologist) for September 2022 before anymore refills. Thank you 1st attempt 180 tablet 0   rosuvastatin (CRESTOR) 5 MG tablet TAKE 1 TABLET(5 MG) BY MOUTH 3 TIMES A WEEK 36 tablet 3   sodium chloride (OCEAN) 0.65 % SOLN nasal spray Place 1 spray into both nostrils daily.     SUMAtriptan (IMITREX) 50 MG tablet TAKE 1 TABLET BY MOUTH EVERY 2 HOURS AS NEEDED FOR MIGRAINE. MAY REPEAT IN 2 HOURS IF HEADACHE PERSISTS OR RECURS 10 tablet 3   No current facility-administered medications on file prior to visit.     Objective:  Objective  Physical Exam Constitutional:      General: She is not in acute distress.    Appearance: Normal appearance. She is not  ill-appearing or toxic-appearing.  HENT:     Head: Normocephalic and atraumatic.     Right Ear: Tympanic membrane, ear canal and external ear normal.     Left Ear: Tympanic membrane, ear canal and external ear normal.     Nose: No congestion or rhinorrhea.  Eyes:     Extraocular Movements: Extraocular movements intact.     Right eye: No nystagmus.     Left eye: No nystagmus.     Pupils: Pupils are equal, round, and reactive to light.  Cardiovascular:     Rate and Rhythm: Normal rate and regular rhythm.     Pulses: Normal pulses.          Posterior tibial pulses are 2+ on the right side and 2+ on the left side.     Heart sounds: Normal heart sounds. No murmur heard. Pulmonary:     Effort: Pulmonary effort is normal. No respiratory distress.     Breath sounds: Normal breath sounds. No wheezing, rhonchi or rales.  Abdominal:     General: Bowel sounds are normal.     Palpations: Abdomen is soft. There is no mass.     Tenderness: There is no abdominal tenderness. There is no guarding.     Hernia: No hernia is present.  Musculoskeletal:        General: Normal range of motion.      Cervical back: Normal range of motion and neck supple.  Skin:    General: Skin is warm and dry.  Neurological:     Mental Status: She is alert and oriented to person, place, and time.     Cranial Nerves: No facial asymmetry.     Motor: Motor function is intact. No weakness.     Deep Tendon Reflexes:     Reflex Scores:      Patellar reflexes are 1+ on the right side and 1+ on the left side. Psychiatric:        Behavior: Behavior normal.   BP 124/72   Pulse (!) 108   Temp 97.7 F (36.5 C)   Resp 16   Ht 5\' 3"  (1.6 m)   Wt 179 lb 9.6 oz (81.5 kg)   LMP 01/08/2013   SpO2 99%   BMI 31.81 kg/m  Wt Readings from Last 3 Encounters:  06/18/21 179 lb 9.6 oz (81.5 kg)  03/28/21 179 lb (81.2 kg)  03/05/21 180 lb (81.6 kg)     Lab Results  Component Value Date   WBC 4.7 06/18/2021   HGB 12.6 06/18/2021   HCT 39.3 06/18/2021   PLT 215.0 06/18/2021   GLUCOSE 119 (H) 06/18/2021   CHOL 227 (H) 06/18/2021   TRIG 227.0 (H) 06/18/2021   HDL 78.80 06/18/2021   LDLDIRECT 44.0 06/18/2021   LDLCALC 76 12/10/2020   ALT 22 06/18/2021   AST 20 06/18/2021   NA 137 06/18/2021   K 4.2 06/18/2021   CL 102 06/18/2021   CREATININE 0.73 06/18/2021   BUN 15 06/18/2021   CO2 24 06/18/2021   TSH 1.14 06/18/2021   HGBA1C 7.9 (H) 06/18/2021   MICROALBUR <0.7 09/17/2015    No results found.   Assessment & Plan:  Plan    Meds ordered this encounter  Medications   zolpidem (AMBIEN) 10 MG tablet    Sig: Take 1 tablet (10 mg total) by mouth at bedtime as needed for sleep.    Dispense:  30 tablet    Refill:  2   FLUoxetine (PROZAC) 20  MG capsule    Sig: 40 mg po daily x 7 days then drop to 20 mg po daily    Dispense:  90 capsule    Refill:  3   ondansetron (ZOFRAN) 4 MG tablet    Sig: TAKE 1 TABLET(4 MG) BY MOUTH EVERY 8 HOURS AS NEEDED FOR NAUSEA OR VOMITING    Dispense:  40 tablet    Refill:  1   venlafaxine XR (EFFEXOR XR) 37.5 MG 24 hr capsule    Sig: 1 cap po daily x 7 days  and then increase to 2 caps po daily    Dispense:  60 capsule    Refill:  1   amphetamine-dextroamphetamine (ADDERALL XR) 30 MG 24 hr capsule    Sig: Take 2 capsules (60 mg total) by mouth every morning. February 2023    Dispense:  60 capsule    Refill:  0   amphetamine-dextroamphetamine (ADDERALL XR) 30 MG 24 hr capsule    Sig: Take 2 capsules (60 mg total) by mouth every morning. January 2023    Dispense:  60 capsule    Refill:  0   amphetamine-dextroamphetamine (ADDERALL XR) 30 MG 24 hr capsule    Sig: Take 2 capsules (60 mg total) by mouth every morning. December 2022    Dispense:  60 capsule    Refill:  0   metFORMIN (GLUCOPHAGE) 500 MG tablet    Sig: Take 2 tablets (1,000 mg total) by mouth 2 (two) times daily.    Dispense:  360 tablet    Refill:  3   ALPRAZolam (XANAX) 1 MG tablet    Sig: Take 1 tablet (1 mg total) by mouth 2 (two) times daily as needed for anxiety.    Dispense:  30 tablet    Refill:  1   fluticasone (FLONASE) 50 MCG/ACT nasal spray    Sig: SHAKE LIQUID AND USE 2 SPRAYS IN EACH NOSTRIL DAILY    Dispense:  16 g    Refill:  3    Problem List Items Addressed This Visit     Hyperlipidemia    Encourage heart healthy diet such as MIND or DASH diet, increase exercise, avoid trans fats, simple carbohydrates and processed foods, consider a krill or fish or flaxseed oil cap daily.       Relevant Orders   CBC with Differential/Platelet (Completed)   Comprehensive metabolic panel (Completed)   Lipid panel (Completed)   TSH (Completed)   Diabetes mellitus type 2 in obese (HCC) - Primary   Relevant Medications   metFORMIN (GLUCOPHAGE) 500 MG tablet   Other Relevant Orders   Hemoglobin A1c (Completed)   Cervical cancer screening    Referred to OB/GYN for well woman care      Relevant Orders   Ambulatory referral to Obstetrics / Gynecology   Preventative health care    Patient encouraged to maintain heart healthy diet, regular exercise, adequate sleep.  Consider daily probiotics. Take medications as prescribed. Labs ordered and reviewed. Referred to OB/GYN for pap, referred to gastroenterology for colonoscopy. Mgm ordered, flu shot given      Abnormal serum level of alkaline phosphatase    Hydrate and monitor      Hypertension    Well controlled, no changes to meds. Encouraged heart healthy diet such as the DASH diet and exercise as tolerated.       Relevant Orders   CBC with Differential/Platelet (Completed)   Comprehensive metabolic panel (Completed)   Lipid panel (Completed)  TSH (Completed)   Obesity (BMI 30-39.9)    Encouraged DASH or MIND diet, decrease po intake and increase exercise as tolerated. Needs 7-8 hours of sleep nightly. Avoid trans fats, eat small, frequent meals every 4-5 hours with lean proteins, complex carbs and healthy fats. Minimize simple carbs, high fat foods and processed foods. Consider medications       Relevant Medications   amphetamine-dextroamphetamine (ADDERALL XR) 30 MG 24 hr capsule   amphetamine-dextroamphetamine (ADDERALL XR) 30 MG 24 hr capsule   amphetamine-dextroamphetamine (ADDERALL XR) 30 MG 24 hr capsule   metFORMIN (GLUCOPHAGE) 500 MG tablet   Adult ADHD    Doing well on current meds no changes      Back pain    Encouraged moist heat and gentle stretching as tolerated. May try NSAIDs and prescription meds as directed and report if symptoms worsen or seek immediate care. May continue prn meds, hydrocodone sparingly      Staph infection    Skin is clearing up but when he has to wear a mask regularly at work her skin flares again. She will try to work from home some while her skin finishes healing      Other Visit Diagnoses     Need for influenza vaccination       Relevant Orders   Flu Vaccine QUAD 36+ mos IM (Fluarix, Fluzone & Afluria Quad PF (Completed)   Colon cancer screening       Relevant Orders   Ambulatory referral to Gastroenterology   Breast cancer screening by  mammogram       Relevant Orders   MM 3D SCREEN BREAST BILATERAL       Follow-up: Return in about 6 months (around 12/16/2021) for 6-8 week VV and 6 mn in person f/u.  I, Billie Lade, acting as a scribe for Danise Edge, MD, have documented all relevent documentation on behalf of Danise Edge, MD, as directed by Danise Edge, MD while in the presence of Danise Edge, MD. DO:06/19/21.  I, [name], MD personally performed the services described in this documentation. All medical record entries made by the scribe were at my direction and in my presence. I have reviewed the chart and agree that the record reflects my personal performance and is accurate and complete

## 2021-06-18 NOTE — Patient Instructions (Signed)
Preventive Care 92-47 Years Old, Female Preventive care refers to lifestyle choices and visits with your health care provider that can promote health and wellness. Preventive care visits are also called wellness exams. What can I expect for my preventive care visit? Counseling Your health care provider may ask you questions about your: Medical history, including: Past medical problems. Family medical history. Pregnancy history. Current health, including: Menstrual cycle. Method of birth control. Emotional well-being. Home life and relationship well-being. Sexual activity and sexual health. Lifestyle, including: Alcohol, nicotine or tobacco, and drug use. Access to firearms. Diet, exercise, and sleep habits. Work and work Statistician. Sunscreen use. Safety issues such as seatbelt and bike helmet use. Physical exam Your health care provider will check your: Height and weight. These may be used to calculate your BMI (body mass index). BMI is a measurement that tells if you are at a healthy weight. Waist circumference. This measures the distance around your waistline. This measurement also tells if you are at a healthy weight and may help predict your risk of certain diseases, such as type 2 diabetes and high blood pressure. Heart rate and blood pressure. Body temperature. Skin for abnormal spots. What immunizations do I need? Vaccines are usually given at various ages, according to a schedule. Your health care provider will recommend vaccines for you based on your age, medical history, and lifestyle or other factors, such as travel or where you work. What tests do I need? Screening Your health care provider may recommend screening tests for certain conditions. This may include: Lipid and cholesterol levels. Diabetes screening. This is done by checking your blood sugar (glucose) after you have not eaten for a while (fasting). Pelvic exam and Pap test. Hepatitis B test. Hepatitis C  test. HIV (human immunodeficiency virus) test. STI (sexually transmitted infection) testing, if you are at risk. Lung cancer screening. Colorectal cancer screening. Mammogram. Talk with your health care provider about when you should start having regular mammograms. This may depend on whether you have a family history of breast cancer. BRCA-related cancer screening. This may be done if you have a family history of breast, ovarian, tubal, or peritoneal cancers. Bone density scan. This is done to screen for osteoporosis. Talk with your health care provider about your test results, treatment options, and if necessary, the need for more tests. Follow these instructions at home: Eating and drinking  Eat a diet that includes fresh fruits and vegetables, whole grains, lean protein, and low-fat dairy products. Take vitamin and mineral supplements as recommended by your health care provider. Do not drink alcohol if: Your health care provider tells you not to drink. You are pregnant, may be pregnant, or are planning to become pregnant. If you drink alcohol: Limit how much you have to 0-1 drink a day. Know how much alcohol is in your drink. In the [place] one drink equals one 12 oz bottle of beer (355 mL), one 5 oz glass of wine (148 mL), or one 1 oz glass of hard liquor (44 mL). Lifestyle Brush your teeth every morning and night with fluoride toothpaste. Floss one time each day. Exercise for at least 30 minutes 5 or more days each week. Do not use any products that contain nicotine or tobacco. These products include cigarettes, chewing tobacco, and vaping devices, such as e-cigarettes. If you need help quitting, ask your health care provider. Do not use drugs. If you are sexually active, practice safe sex. Use a condom or other form of protection to prevent  STIs. If you do not wish to become pregnant, use a form of birth control. If you plan to become pregnant, see your health care provider for a  prepregnancy visit. Take aspirin only as told by your health care provider. Make sure that you understand how much to take and what form to take. Work with your health care provider to find out whether it is safe and beneficial for you to take aspirin daily. Find healthy ways to manage stress, such as: Meditation, yoga, or listening to music. Journaling. Talking to a trusted person. Spending time with friends and family. Minimize exposure to UV radiation to reduce your risk of skin cancer. Safety Always wear your seat belt while driving or riding in a vehicle. Do not drive: If you have been drinking alcohol. Do not ride with someone who has been drinking. When you are tired or distracted. While texting. If you have been using any mind-altering substances or drugs. Wear a helmet and other protective equipment during sports activities. If you have firearms in your house, make sure you follow all gun safety procedures. Seek help if you have been physically or sexually abused. What's next? Visit your health care provider once a year for an annual wellness visit. Ask your health care provider how often you should have your eyes and teeth checked. Stay up to date on all vaccines. This information is not intended to replace advice given to you by your health care provider. Make sure you discuss any questions you have with your health care provider. Document Revised: 01/23/2021 Document Reviewed: 01/23/2021 Elsevier Patient Education  [employer].

## 2021-06-19 ENCOUNTER — Telehealth: Payer: Self-pay

## 2021-06-19 ENCOUNTER — Other Ambulatory Visit: Payer: Self-pay | Admitting: Family Medicine

## 2021-06-19 ENCOUNTER — Other Ambulatory Visit: Payer: Self-pay

## 2021-06-19 DIAGNOSIS — E782 Mixed hyperlipidemia: Secondary | ICD-10-CM

## 2021-06-19 DIAGNOSIS — R000 Tachycardia, unspecified: Secondary | ICD-10-CM

## 2021-06-19 DIAGNOSIS — I10 Essential (primary) hypertension: Secondary | ICD-10-CM

## 2021-06-19 LAB — COMPREHENSIVE METABOLIC PANEL
ALT: 22 U/L (ref 0–35)
AST: 20 U/L (ref 0–37)
Albumin: 4.5 g/dL (ref 3.5–5.2)
Alkaline Phosphatase: 152 U/L — ABNORMAL HIGH (ref 39–117)
BUN: 15 mg/dL (ref 6–23)
CO2: 24 mEq/L (ref 19–32)
Calcium: 9.9 mg/dL (ref 8.4–10.5)
Chloride: 102 mEq/L (ref 96–112)
Creatinine, Ser: 0.73 mg/dL (ref 0.40–1.20)
GFR: 98.19 mL/min (ref >60.00)
Glucose, Bld: 119 mg/dL — ABNORMAL HIGH (ref 70–99)
Potassium: 4.2 mEq/L (ref 3.5–5.1)
Sodium: 137 mEq/L (ref 135–145)
Total Bilirubin: 0.3 mg/dL (ref 0.2–1.2)
Total Protein: 7.8 g/dL (ref 6.0–8.3)

## 2021-06-19 LAB — CBC WITH DIFFERENTIAL/PLATELET
Basophils Absolute: 0.1 10*3/uL (ref 0.0–0.1)
Basophils Relative: 1.5 % (ref 0.0–3.0)
Eosinophils Absolute: 0.1 10*3/uL (ref 0.0–0.7)
Eosinophils Relative: 2.7 % (ref 0.0–5.0)
HCT: 39.3 % (ref 36.0–46.0)
Hemoglobin: 12.6 g/dL (ref 12.0–15.0)
Lymphocytes Relative: 42.7 % (ref 12.0–46.0)
Lymphs Abs: 2 10*3/uL (ref 0.7–4.0)
MCHC: 32 g/dL (ref 30.0–36.0)
MCV: 90 fl (ref 78.0–100.0)
Monocytes Absolute: 0.3 10*3/uL (ref 0.1–1.0)
Monocytes Relative: 5.3 % (ref 3.0–12.0)
Neutro Abs: 2.3 10*3/uL (ref 1.4–7.7)
Neutrophils Relative %: 47.8 % (ref 43.0–77.0)
Platelets: 215 10*3/uL (ref 150.0–400.0)
RBC: 4.37 Mil/uL (ref 3.87–5.11)
RDW: 15.8 % — ABNORMAL HIGH (ref 11.5–15.5)
WBC: 4.7 10*3/uL (ref 4.0–10.5)

## 2021-06-19 LAB — LDL CHOLESTEROL, DIRECT: Direct LDL: 44 mg/dL

## 2021-06-19 LAB — LIPID PANEL
Cholesterol: 227 mg/dL — ABNORMAL HIGH (ref 0–200)
HDL: 78.8 mg/dL (ref >39.00)
NonHDL: 148.09
Total CHOL/HDL Ratio: 3
Triglycerides: 227 mg/dL — ABNORMAL HIGH (ref 0.0–149.0)
VLDL: 45.4 mg/dL — ABNORMAL HIGH (ref 0.0–40.0)

## 2021-06-19 LAB — HEMOGLOBIN A1C: Hgb A1c MFr Bld: 7.9 % — ABNORMAL HIGH (ref 4.6–6.5)

## 2021-06-19 LAB — TSH: TSH: 1.14 u[IU]/mL (ref 0.35–5.50)

## 2021-06-19 MED ORDER — HYDROCODONE-ACETAMINOPHEN 7.5-325 MG PO TABS
1.0000 | ORAL_TABLET | Freq: Four times a day (QID) | ORAL | 0 refills | Status: DC | PRN
Start: 2021-06-19 — End: 2021-07-19

## 2021-06-19 MED ORDER — LOSARTAN POTASSIUM 50 MG PO TABS: ORAL_TABLET | ORAL | 5 refills | Status: DC

## 2021-06-19 MED ORDER — MUPIROCIN 2 % EX OINT: TOPICAL_OINTMENT | CUTANEOUS | 1 refills | Status: DC

## 2021-06-19 NOTE — Assessment & Plan Note (Signed)
Encourage heart healthy diet such as MIND or DASH diet, increase exercise, avoid trans fats, simple carbohydrates and processed foods, consider a krill or fish or flaxseed oil cap daily.  °

## 2021-06-19 NOTE — Assessment & Plan Note (Signed)
Encouraged DASH or MIND diet, decrease po intake and increase exercise as tolerated. Needs 7-8 hours of sleep nightly. Avoid trans fats, eat small, frequent meals every 4-5 hours with lean proteins, complex carbs and healthy fats. Minimize simple carbs, high fat foods and processed foods. Consider medications  

## 2021-06-19 NOTE — Assessment & Plan Note (Signed)
Referred to OB/GYN for well woman care.  °

## 2021-06-19 NOTE — Assessment & Plan Note (Signed)
Doing well on current meds no changes 

## 2021-06-19 NOTE — Telephone Encounter (Signed)
Pt called because we got a note back from [facility] that pt will need to contact there billing office before an appointment can be scheduled.   Spoke with and she is a aware   Pt needed losartan and bactroban sent in.done   Pt aware of lab results  Pt would like norco sent in

## 2021-06-19 NOTE — Assessment & Plan Note (Signed)
Encouraged moist heat and gentle stretching as tolerated. May try NSAIDs and prescription meds as directed and report if symptoms worsen or seek immediate care. May continue prn meds, hydrocodone sparingly

## 2021-06-19 NOTE — Assessment & Plan Note (Signed)
Well controlled, no changes to meds. Encouraged heart healthy diet such as the DASH diet and exercise as tolerated.  °

## 2021-06-19 NOTE — Assessment & Plan Note (Signed)
Skin is clearing up but when he has to wear a mask regularly at work her skin flares again. She will try to work from home some while her skin finishes healing

## 2021-06-19 NOTE — Assessment & Plan Note (Signed)
Patient encouraged to maintain heart healthy diet, regular exercise, adequate sleep. Consider daily probiotics. Take medications as prescribed. Labs ordered and reviewed. Referred to OB/GYN for pap, referred to gastroenterology for colonoscopy. Mgm ordered, flu shot given

## 2021-06-19 NOTE — Assessment & Plan Note (Signed)
Hydrate and monitor 

## 2021-06-21 ENCOUNTER — Telehealth (HOSPITAL_BASED_OUTPATIENT_CLINIC_OR_DEPARTMENT_OTHER): Payer: Self-pay

## 2021-06-21 NOTE — Telephone Encounter (Signed)
Nicole Ball fixed it

## 2021-06-25 ENCOUNTER — Other Ambulatory Visit: Payer: Self-pay | Admitting: Family Medicine

## 2021-06-25 DIAGNOSIS — Z124 Encounter for screening for malignant neoplasm of cervix: Secondary | ICD-10-CM

## 2021-06-25 DIAGNOSIS — N912 Amenorrhea, unspecified: Secondary | ICD-10-CM

## 2021-07-09 ENCOUNTER — Ambulatory Visit: Payer: 59 | Admitting: Internal Medicine

## 2021-07-09 NOTE — Progress Notes (Deleted)
Name: Nicole Ball  MRN/ DOB: [id number], [birth year]   Age/ Sex: 45 y.o., female    PCP: [name], MD   Reason for Endocrinology Evaluation: Type 2 Diabetes Mellitus     Date of Initial Endocrinology Visit: 03/05/2021    PATIENT IDENTIFIER: Nicole Ball is a 45 y.o. female with a past medical history of T2DM and HTN. The patient presented for initial endocrinology clinic visit on 03/05/2021 for consultative assistance with her diabetes management.    HPI: Nicole Ball was    Diagnosed with DM in 2015 Prior Medications tried/Intolerance: as listed  Currently checking blood sugars occasionally  Hypoglycemia episodes : no                Hemoglobin A1c has ranged from 7.1% in 2016, peaking at 10.0% in 2022. Patient required assistance for hypoglycemia:  Patient has required hospitalization within the last 1 year from hyper or hypoglycemia: no  In terms of diet, the patient eats 2 meals a day and snacks . Drinks sugar-sweetened beverages   Admits to imperfect adherence to medications  Recent MRSA infection , on Abx  Has not been taking Glimepiride due to hypoglycemia  Denies pancreatitis   Husband on Ozempic and it makes him sick. She already has nausea issues    DIABETIC HISTORY:  Nicole Ball was diagnosed with DM in 2015.  She has been on Metformin and Glimepiride since her diagnosis. Her  hemoglobin A1c has ranged from 7.1% in 2016, peaking at 10.0% in 2022.   On her initial visit to our clinic she had an A1c of   8.5%, she was on Metformin only and was taking  Glimepiride  only for " high sugars" due to hypoglycemia . We restarted Glimepiride and explained to the pt how to take it correctly to avoid hypoglycemia   She is not keen on Ozempic use as husband on it with GI side effects and she has occasional nausea    SUBJECTIVE:   During the last visit (03/05/2021): A1c was 8.5% Continued Metformin and restarted Glimepiride   Today (07/09/21): Nicole Ball  here for a follow up on diabetes management. She  checks her blood sugars *** times daily, preprandial to breakfast and ***. The patient has *** had hypoglycemic episodes since the last clinic visit, which typically occur *** x / - most often occuring ***. The patient is *** symptomatic with these episodes, with symptoms of {symptoms; hypoglycemia:9084048}.   HOME DIABETES REGIMEN: Glimepiride 2 mg , 1 tablet daily  Metformin 500 mg 2 tabs BID     Statin: yes ACE-I/ARB: yes Prior Diabetic Education: yes    METER DOWNLOAD SUMMARY: Did not bring    DIABETIC COMPLICATIONS: Microvascular complications:  Neuropathy Denies: CKD, retinopathy  Last eye exam: Completed 12/ 2021  Macrovascular complications:   Denies: CAD, PVD, CVA   PAST HISTORY: Past Medical History:  Past Medical History:  Diagnosis Date   Abnormal serum level of alkaline phosphatase 05/21/2014   Acute low back pain 12/19/2010   ALLERGIC RHINITIS 10/16/2010   Anxiety and depression 06/07/2011   Atrophic vaginitis 08/17/2016   Cervical cancer screening 01/10/2014   Menarche at 8 Regular and moderate flow, became irregular until OCPs  history of abnormal pap in past, repeat was normal Last pap roughly 3 years ago G0P0, s/p No history of abnormal MGM, no h/o MGM  No concerns today no gyn surgeries LMP May 2013   CTS (carpal tunnel syndrome) 01/15/2014  Diabetes mellitus type 2 in obese (HCC) 10/30/2011   Dry eyes 08/17/2016   Early menopause    Essential hypertension    HIATAL HERNIA WITH REFLUX, HX OF 10/16/2010   Hyperlipidemia 04/21/2011   Inappropriate sinus tachycardia    INSOMNIA 10/16/2010   Knee pain, left 03/11/2012   Migraine 03/07/2013   Morbid obesity (HCC)    Palpitations 01/28/2015   Reflux 04/22/2012   Sleep apnea 06/07/2011   Tobacco use disorder 02/10/2017   Urinary urgency 08/17/2016   UTI'S, HX OF 10/16/2010   Past Surgical History:  Past Surgical History:  Procedure Laterality Date   CHOLECYSTECTOMY       Social History:  reports that she has been smoking cigars and cigarettes. She has been smoking an average of .1 packs per day. She has never used smokeless tobacco. She reports current alcohol use. She reports that she does not use drugs. Family History:  Family History  Problem Relation Age of Onset   Lupus Mother    Lung disease Mother    Cancer Father        cigarette, alcohol HEENT squamous cell   Alcohol abuse Father    Thyroid disease Sister    Asthma Sister    Allergies Brother    Alcohol abuse Maternal Aunt    Alcohol abuse Maternal Uncle    Arthritis Maternal Grandmother    Scleroderma Maternal Grandmother    Glaucoma Maternal Grandmother    Cataracts Maternal Grandmother    Cancer Maternal Grandfather        prostate   Coronary artery disease Maternal Grandfather        s/p angioplasty   Diabetes Maternal Grandfather        Type 2   Cancer Paternal Grandmother        Breast ca- dx in 50's   Thyroid disease Paternal Grandmother    Breast cancer Paternal Grandmother    Diabetes Paternal Grandfather    Arthritis Other      HOME MEDICATIONS: Allergies as of 07/09/2021   No Known Allergies      Medication List        Accurate as of July 09, 2021 11:12 AM. If you have any questions, ask your nurse or doctor.          ACETAMINOPHEN-BUTALBITAL 50-325 MG Tabs Take 1 tablet by mouth 2 (two) times daily as needed.   albuterol 108 (90 Base) MCG/ACT inhaler Commonly known as: VENTOLIN HFA Inhale 2 puffs into the lungs every 6 (six) hours as needed for wheezing or shortness of breath.   ALPRAZolam 1 MG tablet Commonly known as: XANAX Take 1 tablet (1 mg total) by mouth 2 (two) times daily as needed for anxiety.   amphetamine-dextroamphetamine 30 MG 24 hr capsule Commonly known as: Adderall XR Take 2 capsules (60 mg total) by mouth every morning. February 2023   amphetamine-dextroamphetamine 30 MG 24 hr capsule Commonly known as: Adderall XR Take 2  capsules (60 mg total) by mouth every morning. January 2023   amphetamine-dextroamphetamine 30 MG 24 hr capsule Commonly known as: Adderall XR Take 2 capsules (60 mg total) by mouth every morning. December 2022   azelastine 0.1 % nasal spray Commonly known as: ASTELIN Place 2 sprays into both nostrils 2 (two) times daily. Use in each nostril as directed   baclofen 10 MG tablet Commonly known as: LIORESAL Take 1 tablet (10 mg total) by mouth 3 (three) times daily.   BLACK COHOSH PO Take 1 tablet  by mouth daily.   celecoxib 200 MG capsule Commonly known as: CeleBREX One to 2 tablets by mouth daily as needed for pain.   Flovent HFA 220 MCG/ACT inhaler Generic drug: fluticasone Inhale 1 puff into the lungs in the morning and at bedtime.   FLUoxetine 20 MG capsule Commonly known as: PROZAC 40 mg po daily x 7 days then drop to 20 mg po daily   fluticasone 50 MCG/ACT nasal spray Commonly known as: FLONASE SHAKE LIQUID AND USE 2 SPRAYS IN EACH NOSTRIL DAILY   gabapentin 600 MG tablet Commonly known as: NEURONTIN Take 1 tablet (600 mg total) by mouth 3 (three) times daily.   GINGER PO Take 1 tablet by mouth daily.   glimepiride 2 MG tablet Commonly known as: AMARYL Take 1 tablet (2 mg total) by mouth daily with breakfast.   HYDROcodone-acetaminophen 7.5-325 MG tablet Commonly known as: Norco Take 1 tablet by mouth every 6 (six) hours as needed for moderate pain.   HYDROCORTISONE (TOPICAL) 2 % Lotn Apply 1 Dose topically daily as needed.   hyoscyamine 0.125 MG SL tablet Commonly known as: LEVSIN SL Take 1 tablet (0.125 mg total) by mouth every 4 (four) hours as needed.   levocetirizine 5 MG tablet Commonly known as: XYZAL Take 1 tablet (5 mg total) by mouth in the morning and at bedtime.   losartan 50 MG tablet Commonly known as: COZAAR TAKE 1 TABLET(50 MG) BY MOUTH DAILY   metFORMIN 500 MG tablet Commonly known as: GLUCOPHAGE Take 2 tablets (1,000 mg total) by  mouth 2 (two) times daily.   montelukast 10 MG tablet Commonly known as: SINGULAIR TAKE 1 TABLET(10 MG) BY MOUTH AT BEDTIME   mupirocin ointment 2 % Commonly known as: BACTROBAN APPLY VIA CLEAN Q-TIP TO NARES EVERY NIGHT AT BEDTIME   omeprazole 20 MG capsule Commonly known as: PRILOSEC TAKE 1 CAPSULE(20 MG) BY MOUTH TWICE DAILY BEFORE A MEAL   ondansetron 4 MG tablet Commonly known as: ZOFRAN TAKE 1 TABLET(4 MG) BY MOUTH EVERY 8 HOURS AS NEEDED FOR NAUSEA OR VOMITING   OneTouch Verio test strip Generic drug: glucose blood 1 each by Other route daily in the afternoon. Use as instructed   Premarin vaginal cream Generic drug: conjugated estrogens Apply small amount to affected area twice a week   PROBIOTIC PO Take 1 tablet by mouth daily.   promethazine 25 MG tablet Commonly known as: PHENERGAN TAKE 1 TABLET(25 MG) BY MOUTH EVERY 8 HOURS AS NEEDED FOR NAUSEA OR VOMITING   propranolol 60 MG tablet Commonly known as: INDERAL TAKE 1 TABLET(60 MG) BY MOUTH TWICE DAILY. Please make yearly appt with Dr. Shari Prows (Cardiologist) for September 2022 before anymore refills. Thank you 1st attempt   rosuvastatin 5 MG tablet Commonly known as: CRESTOR TAKE 1 TABLET(5 MG) BY MOUTH 3 TIMES A WEEK   sodium chloride 0.65 % Soln nasal spray Commonly known as: OCEAN Place 1 spray into both nostrils daily.   SUMAtriptan 50 MG tablet Commonly known as: IMITREX TAKE 1 TABLET BY MOUTH EVERY 2 HOURS AS NEEDED FOR MIGRAINE. MAY REPEAT IN 2 HOURS IF HEADACHE PERSISTS OR RECURS   venlafaxine XR 37.5 MG 24 hr capsule Commonly known as: Effexor XR 1 cap po daily x 7 days and then increase to 2 caps po daily   zolpidem 10 MG tablet Commonly known as: AMBIEN Take 1 tablet (10 mg total) by mouth at bedtime as needed for sleep.  ALLERGIES: No Known Allergies   REVIEW OF SYSTEMS: A comprehensive ROS was conducted with the patient and is negative except as per HPI      OBJECTIVE:   VITAL SIGNS: LMP 01/08/2013    PHYSICAL EXAM:  General: Pt appears well and is in NAD  Neck: General: Supple without adenopathy or carotid bruits. Thyroid: Thyroid size normal.  No goiter or nodules appreciated.   Lungs: Clear with good BS bilat with no rales, rhonchi, or wheezes  Heart: RRR with normal S1 and S2 and no gallops; no murmurs; no rub  Abdomen: Normoactive bowel sounds, soft, nontender, without masses or organomegaly palpable  Extremities:  Lower extremities - No pretibial edema. No lesions.  Skin: Normal texture and temperature to palpation. No rash noted. No Acanthosis nigricans/skin tags. No lipohypertrophy.  Neuro: MS is good with appropriate affect, pt is alert and Ox3    DM foot exam: 03/05/2021   The skin of the feet is intact without sores or ulcerations. The pedal pulses are 2+ on right and 2+ on left. The sensation is intact to a screening 5.07, 10 gram monofilament bilaterally  DATA REVIEWED:  Lab Results  Component Value Date   HGBA1C 7.9 (H) 06/18/2021   HGBA1C 8.5 (A) 03/05/2021   HGBA1C 8.8 (H) 12/10/2020   Lab Results  Component Value Date   MICROALBUR <0.7 09/17/2015   LDLCALC 76 12/10/2020   CREATININE 0.73 06/18/2021   Lab Results  Component Value Date   MICRALBCREAT 0.9 09/17/2015    Lab Results  Component Value Date   CHOL 227 (H) 06/18/2021   HDL 78.80 06/18/2021   LDLCALC 76 12/10/2020   LDLDIRECT 44.0 06/18/2021   TRIG 227.0 (H) 06/18/2021   CHOLHDL 3 06/18/2021        ASSESSMENT / PLAN / RECOMMENDATIONS:   1) Type 2 Diabetes Mellitus, Poorly controlled, Withneuropathic complications - Most recent A1c of 8.5 %. Goal A1c < 7.0 %.    Plan: GENERAL: I have discussed with the patient the pathophysiology of diabetes. We went over the natural progression of the disease. We talked about both insulin resistance and insulin deficiency. We stressed the importance of lifestyle changes including diet and exercise. I  explained the complications associated with diabetes including retinopathy, nephropathy, neuropathy as well as increased risk of cardiovascular disease. We went over the benefit seen with glycemic control.   I explained to the patient that diabetic patients are at higher than normal risk for amputations.  Poorly controlled diabetes due to medication non adherence and dietary indiscretions, we discussed the importance of avoiding sugar-sweetened beverages as well as avoiding snacks.  We discussed add-on therapy with GLP-1 agonists but her husband is on Ozempic and he gets sick 2 within 2 days of taking it and she would like to avoid this .  We also discussed SGLT-2 inhibitors , but given that she has been having MRSA infections, even though the risk is mainly with genital infections with SGLT-2 inhibitors, we opted to hold off on this for now  but will consider this in the future Will restart Glimepiride as below   MEDICATIONS:  Continue Metformin 500 mg, TWO Tablets in the morning and TWO tablets in the evening  Take Glimepiride 2 mg, ONE tablet BEFORE the first meal of the day   EDUCATION / INSTRUCTIONS: BG monitoring instructions: Patient is instructed to check her blood sugars 1 times a day, fasting . Call [facility] if: BG persistently < 70  I reviewed  the Rule of 15 for the treatment of hypoglycemia in detail with the patient. Literature supplied.   2) Diabetic complications:  Eye: Does not have known diabetic retinopathy.  Neuro/ Feet: Does  have known diabetic peripheral neuropathy. Renal: Patient does not have known baseline CKD. She is  on an ACEI/ARB at present.   3) Dyslipidemia: Patient is on Rosuvastatin. LDL at goal  Discussed cardiovascular benefits, and encouraged compliance    Continue Rosuvastatin 5 mg daily   F/U 4 months    Signed electronically by: Lyndle Herrlich, MD  [facility] Endocrinology  [facility] [address]  [place] [place], [place] [address] Phone: [phone] FAX: [phone]   CC: [name], MD [address] [place] [address] Phone: [phone]  Fax: [phone]    Return to Endocrinology clinic as below: Future Appointments  Date Time Provider Department Center  07/09/2021  1:00 PM Navjot Loera, Konrad Dolores, MD [facility]  07/25/2021  3:20 PM [name], MD [facility]  07/30/2021 11:20 AM Esperanza Richters, PA-C [facility]  12/23/2021  2:00 PM [name], MD [facility]

## 2021-07-11 ENCOUNTER — Ambulatory Visit: Payer: 59 | Admitting: Family Medicine

## 2021-07-19 ENCOUNTER — Other Ambulatory Visit: Payer: Self-pay | Admitting: Family Medicine

## 2021-07-19 ENCOUNTER — Other Ambulatory Visit: Payer: Self-pay | Admitting: Cardiology

## 2021-07-20 ENCOUNTER — Other Ambulatory Visit: Payer: Self-pay | Admitting: Family Medicine

## 2021-07-22 MED ORDER — BUTALBITAL-ACETAMINOPHEN 50-325 MG PO TABS: 1.0000 | ORAL_TABLET | Freq: Two times a day (BID) | ORAL | 0 refills | Status: DC | PRN

## 2021-07-22 MED ORDER — GABAPENTIN 600 MG PO TABS: 600.0000 mg | ORAL_TABLET | Freq: Three times a day (TID) | ORAL | 0 refills | Status: DC

## 2021-07-22 MED ORDER — PROPRANOLOL HCL 60 MG PO TABS: ORAL_TABLET | ORAL | 0 refills | Status: DC

## 2021-07-22 MED ORDER — HYDROCODONE-ACETAMINOPHEN 7.5-325 MG PO TABS: 1.0000 | ORAL_TABLET | Freq: Four times a day (QID) | ORAL | 0 refills | Status: DC | PRN

## 2021-07-22 MED ORDER — HYOSCYAMINE SULFATE 0.125 MG SL SUBL: 0.1250 mg | SUBLINGUAL_TABLET | SUBLINGUAL | 0 refills | Status: DC | PRN

## 2021-07-25 ENCOUNTER — Telehealth (INDEPENDENT_AMBULATORY_CARE_PROVIDER_SITE_OTHER): Payer: 59 | Admitting: Family Medicine

## 2021-07-25 ENCOUNTER — Other Ambulatory Visit: Payer: Self-pay | Admitting: Family Medicine

## 2021-07-25 DIAGNOSIS — E782 Mixed hyperlipidemia: Secondary | ICD-10-CM

## 2021-07-25 DIAGNOSIS — M5416 Radiculopathy, lumbar region: Secondary | ICD-10-CM

## 2021-07-25 DIAGNOSIS — E1169 Type 2 diabetes mellitus with other specified complication: Secondary | ICD-10-CM

## 2021-07-25 DIAGNOSIS — N952 Postmenopausal atrophic vaginitis: Secondary | ICD-10-CM

## 2021-07-25 DIAGNOSIS — I10 Essential (primary) hypertension: Secondary | ICD-10-CM | POA: Diagnosis not present

## 2021-07-25 DIAGNOSIS — G4700 Insomnia, unspecified: Secondary | ICD-10-CM

## 2021-07-25 DIAGNOSIS — F330 Major depressive disorder, recurrent, mild: Secondary | ICD-10-CM

## 2021-07-25 DIAGNOSIS — E669 Obesity, unspecified: Secondary | ICD-10-CM

## 2021-07-25 MED ORDER — PREMARIN 0.625 MG/GM VA CREA: TOPICAL_CREAM | VAGINAL | 1 refills | Status: DC

## 2021-07-25 MED ORDER — FLUOXETINE HCL 20 MG PO CAPS: 20.0000 mg | ORAL_CAPSULE | Freq: Every day | ORAL | 1 refills | Status: DC

## 2021-07-25 MED ORDER — HYDROCODONE-ACETAMINOPHEN 10-325 MG PO TABS: 1.0000 | ORAL_TABLET | Freq: Three times a day (TID) | ORAL | 0 refills | Status: DC | PRN

## 2021-07-25 MED ORDER — VENLAFAXINE HCL ER 75 MG PO CP24: 75.0000 mg | ORAL_CAPSULE | Freq: Every day | ORAL | 1 refills | Status: DC

## 2021-07-25 MED ORDER — TIRZEPATIDE 2.5 MG/0.5ML ~~LOC~~ SOAJ: 2.5000 mg | SUBCUTANEOUS | 0 refills | Status: DC

## 2021-07-25 MED ORDER — TIRZEPATIDE 5 MG/0.5ML ~~LOC~~ SOAJ: 5.0000 mg | SUBCUTANEOUS | 0 refills | Status: DC

## 2021-07-25 NOTE — Assessment & Plan Note (Signed)
She is doing better on Venlafaxine 75 mg daily and Fluoxetine 20 mg daily. She does note a drop in libido but is combating it with a supplement. No changes today. Uses Venlafaxine very infrequently

## 2021-07-25 NOTE — Assessment & Plan Note (Signed)
Adequate response to Premarin cream. Refill given today

## 2021-07-25 NOTE — Progress Notes (Signed)
MyChart Video Visit    Virtual Visit via Video Note   This visit type was conducted due to national recommendations for restrictions regarding the COVID-19 Pandemic (e.g. social distancing) in an effort to limit this patient's exposure and mitigate transmission in our community. This patient is at least at moderate risk for complications without adequate follow up. This format is felt to be most appropriate for this patient at this time. Physical exam was limited by quality of the video and audio technology used for the visit. S. Chism, CMA was able to get the patient set up on a video visit.  Patient location: Home Patient and provider in visit Provider location: Office  I discussed the limitations of evaluation and management by telemedicine and the availability of in person appointments. The patient expressed understanding and agreed to proceed.  Visit Date: 07/25/21  Today's healthcare provider: Danise Edge, MD     Subjective:    Patient ID: Nicole Ball, female    DOB: June 10, 1976, 45 y.o.   MRN: [id number]  Chief Complaint  Patient presents with   3 months follow up    HPI Patient is in today for virtual video visit for follow up on allergies and weight management. She is doing well and has no recent febrile illnesses or ER visits to report. Her current glucose reading was 254, however she states that today she took her metformin late and had a couple of sweets prior to visit. She is currently not taking her glimepiride 2 mg. Denies CP/palp/SOB/HA/congestion/fevers/GI or GU c/o. Taking meds as prescribed.  Skin: MRSA scaring left on face, but no lesions remaining.  Suspects she might have allergies to either canola oil or one of the ingredients she used to cook food from when she experienced allergy symptoms two days ago when she was making tacos.  Venlafaxine XR 37.5 is working for her, however it is causing her reduced libido.    Past Medical History:   Diagnosis Date   Abnormal serum level of alkaline phosphatase 05/21/2014   Acute low back pain 12/19/2010   ALLERGIC RHINITIS 10/16/2010   Anxiety and depression 06/07/2011   Atrophic vaginitis 08/17/2016   Cervical cancer screening 01/10/2014   Menarche at 8 Regular and moderate flow, became irregular until OCPs  history of abnormal pap in past, repeat was normal Last pap roughly 3 years ago G0P0, s/p No history of abnormal MGM, no h/o MGM  No concerns today no gyn surgeries LMP May 2013   CTS (carpal tunnel syndrome) 01/15/2014   Diabetes mellitus type 2 in obese (HCC) 10/30/2011   Dry eyes 08/17/2016   Early menopause    Essential hypertension    HIATAL HERNIA WITH REFLUX, HX OF 10/16/2010   Hyperlipidemia 04/21/2011   Inappropriate sinus tachycardia    INSOMNIA 10/16/2010   Knee pain, left 03/11/2012   Migraine 03/07/2013   Morbid obesity (HCC)    Palpitations 01/28/2015   Reflux 04/22/2012   Sleep apnea 06/07/2011   Tobacco use disorder 02/10/2017   Urinary urgency 08/17/2016   UTI'S, HX OF 10/16/2010    Past Surgical History:  Procedure Laterality Date   CHOLECYSTECTOMY      Family History  Problem Relation Age of Onset   Lupus Mother    Lung disease Mother    Cancer Father        cigarette, alcohol HEENT squamous cell   Alcohol abuse Father    Thyroid disease Sister    Asthma Sister  Allergies Brother    Alcohol abuse Maternal Aunt    Alcohol abuse Maternal Uncle    Arthritis Maternal Grandmother    Scleroderma Maternal Grandmother    Glaucoma Maternal Grandmother    Cataracts Maternal Grandmother    Cancer Maternal Grandfather        prostate   Coronary artery disease Maternal Grandfather        s/p angioplasty   Diabetes Maternal Grandfather        Type 2   Cancer Paternal Grandmother        Breast ca- dx in 50's   Thyroid disease Paternal Grandmother    Breast cancer Paternal Grandmother    Diabetes Paternal Grandfather    Arthritis Other     Social History    Socioeconomic History   Marital status: Married    Spouse name: Not on file   Number of children: Not on file   Years of education: Not on file   Highest education level: Not on file  Occupational History   Not on file  Tobacco Use   Smoking status: Some Days    Packs/day: 0.10    Types: Cigars, Cigarettes   Smokeless tobacco: Never   Tobacco comments:    1 black and milds  Vaping Use   Vaping Use: Never used  Substance and Sexual Activity   Alcohol use: Yes    Comment: twice/year   Drug use: No   Sexual activity: Not on file  Other Topics Concern   Not on file  Social History Narrative   Married lives w/ husband, works at [employer]   Social Determinants of Corporate investment banker Strain: Not on file  Food Insecurity: Not on file  Transportation Needs: Not on file  Physical Activity: Not on file  Stress: Not on file  Social Connections: Not on file  Intimate Partner Violence: Not on file    Outpatient Medications Prior to Visit  Medication Sig Dispense Refill   ACETAMINOPHEN-BUTALBITAL 50-325 MG TABS Take 1 tablet by mouth 2 (two) times daily as needed. 60 tablet 0   albuterol (VENTOLIN HFA) 108 (90 Base) MCG/ACT inhaler Inhale 2 puffs into the lungs every 6 (six) hours as needed for wheezing or shortness of breath. 18 g 5   ALPRAZolam (XANAX) 1 MG tablet Take 1 tablet (1 mg total) by mouth 2 (two) times daily as needed for anxiety. 30 tablet 1   azelastine (ASTELIN) 0.1 % nasal spray Place 2 sprays into both nostrils 2 (two) times daily. Use in each nostril as directed 30 mL 12   baclofen (LIORESAL) 10 MG tablet Take 1 tablet (10 mg total) by mouth 3 (three) times daily. 30 tablet 0   BLACK COHOSH PO Take 1 tablet by mouth daily.     celecoxib (CELEBREX) 200 MG capsule One to 2 tablets by mouth daily as needed for pain. 60 capsule 2   fluticasone (FLONASE) 50 MCG/ACT nasal spray SHAKE LIQUID AND USE 2 SPRAYS IN EACH NOSTRIL DAILY 16 g 3   fluticasone  (FLOVENT HFA) 220 MCG/ACT inhaler Inhale 1 puff into the lungs in the morning and at bedtime. 1 each 1   gabapentin (NEURONTIN) 600 MG tablet Take 1 tablet (600 mg total) by mouth 3 (three) times daily. 90 tablet 0   Ginger, Zingiber officinalis, (GINGER PO) Take 1 tablet by mouth daily.     glucose blood (ONETOUCH VERIO) test strip 1 each by Other route daily in the afternoon. Use as  instructed 100 each 6   HYDROCORTISONE, TOPICAL, 2 % LOTN Apply 1 Dose topically daily as needed. 59.2 mL 1   hyoscyamine (LEVSIN SL) 0.125 MG SL tablet Take 1 tablet (0.125 mg total) by mouth every 4 (four) hours as needed. 30 tablet 0   levocetirizine (XYZAL) 5 MG tablet Take 1 tablet (5 mg total) by mouth in the morning and at bedtime. 60 tablet 5   losartan (COZAAR) 50 MG tablet TAKE 1 TABLET(50 MG) BY MOUTH DAILY 30 tablet 5   metFORMIN (GLUCOPHAGE) 500 MG tablet Take 2 tablets (1,000 mg total) by mouth 2 (two) times daily. 360 tablet 3   montelukast (SINGULAIR) 10 MG tablet TAKE 1 TABLET(10 MG) BY MOUTH AT BEDTIME 90 tablet 1   mupirocin ointment (BACTROBAN) 2 % APPLY VIA CLEAN Q-TIP TO NARES EVERY NIGHT AT BEDTIME 22 g 1   omeprazole (PRILOSEC) 20 MG capsule TAKE 1 CAPSULE(20 MG) BY MOUTH TWICE DAILY BEFORE A MEAL 180 capsule 3   ondansetron (ZOFRAN) 4 MG tablet TAKE 1 TABLET(4 MG) BY MOUTH EVERY 8 HOURS AS NEEDED FOR NAUSEA OR VOMITING 40 tablet 1   Probiotic Product (PROBIOTIC PO) Take 1 tablet by mouth daily.     promethazine (PHENERGAN) 25 MG tablet TAKE 1 TABLET(25 MG) BY MOUTH EVERY 8 HOURS AS NEEDED FOR NAUSEA OR VOMITING 30 tablet 1   propranolol (INDERAL) 60 MG tablet TAKE 1 TABLET(60 MG) BY MOUTH TWICE DAILY. Please make overdue appt with Dr. Shari Prows (Cardiologist) before anymore refills. Thank you 2nd attempt 30 tablet 0   rosuvastatin (CRESTOR) 5 MG tablet TAKE 1 TABLET(5 MG) BY MOUTH 3 TIMES A WEEK 36 tablet 3   sodium chloride (OCEAN) 0.65 % SOLN nasal spray Place 1 spray into both nostrils  daily.     SUMAtriptan (IMITREX) 50 MG tablet TAKE 1 TABLET BY MOUTH EVERY 2 HOURS AS NEEDED FOR MIGRAINE. MAY REPEAT IN 2 HOURS IF HEADACHE PERSISTS OR RECURS 10 tablet 3   zolpidem (AMBIEN) 10 MG tablet Take 1 tablet (10 mg total) by mouth at bedtime as needed for sleep. 30 tablet 2   conjugated estrogens (PREMARIN) vaginal cream Apply small amount to affected area twice a week 42.5 g 1   FLUoxetine (PROZAC) 20 MG capsule 40 mg po daily x 7 days then drop to 20 mg po daily 90 capsule 3   glimepiride (AMARYL) 2 MG tablet Take 1 tablet (2 mg total) by mouth daily with breakfast. 90 tablet 1   HYDROcodone-acetaminophen (NORCO) 7.5-325 MG tablet Take 1 tablet by mouth every 6 (six) hours as needed for moderate pain. 100 tablet 0   venlafaxine XR (EFFEXOR XR) 37.5 MG 24 hr capsule 1 cap po daily x 7 days and then increase to 2 caps po daily 60 capsule 1   amphetamine-dextroamphetamine (ADDERALL XR) 30 MG 24 hr capsule Take 2 capsules (60 mg total) by mouth every morning. February 2023 60 capsule 0   amphetamine-dextroamphetamine (ADDERALL XR) 30 MG 24 hr capsule Take 2 capsules (60 mg total) by mouth every morning. January 2023 60 capsule 0   amphetamine-dextroamphetamine (ADDERALL XR) 30 MG 24 hr capsule Take 2 capsules (60 mg total) by mouth every morning. December 2022 60 capsule 0   No facility-administered medications prior to visit.    No Known Allergies  Review of Systems  Constitutional:  Negative for chills, fever and malaise/fatigue.  HENT:  Negative for congestion, sinus pain and sore throat.   Eyes:  Negative for blurred vision.  Respiratory:  Negative for cough and shortness of breath.   Cardiovascular:  Negative for chest pain, palpitations and leg swelling.  Gastrointestinal:  Negative for blood in stool, diarrhea, nausea and vomiting.  Genitourinary:  Negative for flank pain and frequency.  Musculoskeletal:  Negative for back pain.  Skin:  Negative for rash.  Neurological:   Negative for headaches.      Objective:    Physical Exam Constitutional:      Appearance: Normal appearance.  HENT:     Head: Normocephalic and atraumatic.     Right Ear: External ear normal.     Left Ear: External ear normal.  Pulmonary:     Effort: Pulmonary effort is normal.  Musculoskeletal:        General: Normal range of motion.     Cervical back: Normal range of motion.  Skin:    General: Skin is dry.  Neurological:     Mental Status: She is alert and oriented to person, place, and time.  Psychiatric:        Behavior: Behavior normal.    LMP 01/08/2013  Wt Readings from Last 3 Encounters:  06/18/21 179 lb 9.6 oz (81.5 kg)  03/28/21 179 lb (81.2 kg)  03/05/21 180 lb (81.6 kg)    Diabetic Foot Exam - Simple   No data filed    Lab Results  Component Value Date   WBC 4.7 06/18/2021   HGB 12.6 06/18/2021   HCT 39.3 06/18/2021   PLT 215.0 06/18/2021   GLUCOSE 119 (H) 06/18/2021   CHOL 227 (H) 06/18/2021   TRIG 227.0 (H) 06/18/2021   HDL 78.80 06/18/2021   LDLDIRECT 44.0 06/18/2021   LDLCALC 76 12/10/2020   ALT 22 06/18/2021   AST 20 06/18/2021   NA 137 06/18/2021   K 4.2 06/18/2021   CL 102 06/18/2021   CREATININE 0.73 06/18/2021   BUN 15 06/18/2021   CO2 24 06/18/2021   TSH 1.14 06/18/2021   HGBA1C 7.9 (H) 06/18/2021   MICROALBUR <0.7 09/17/2015    Lab Results  Component Value Date   TSH 1.14 06/18/2021   Lab Results  Component Value Date   WBC 4.7 06/18/2021   HGB 12.6 06/18/2021   HCT 39.3 06/18/2021   MCV 90.0 06/18/2021   PLT 215.0 06/18/2021   Lab Results  Component Value Date   NA 137 06/18/2021   K 4.2 06/18/2021   CO2 24 06/18/2021   GLUCOSE 119 (H) 06/18/2021   BUN 15 06/18/2021   CREATININE 0.73 06/18/2021   BILITOT 0.3 06/18/2021   ALKPHOS 152 (H) 06/18/2021   AST 20 06/18/2021   ALT 22 06/18/2021   PROT 7.8 06/18/2021   ALBUMIN 4.5 06/18/2021   CALCIUM 9.9 06/18/2021   ANIONGAP 10 09/13/2020   GFR 98.19 06/18/2021    Lab Results  Component Value Date   CHOL 227 (H) 06/18/2021   Lab Results  Component Value Date   HDL 78.80 06/18/2021   Lab Results  Component Value Date   LDLCALC 76 12/10/2020   Lab Results  Component Value Date   TRIG 227.0 (H) 06/18/2021   Lab Results  Component Value Date   CHOLHDL 3 06/18/2021   Lab Results  Component Value Date   HGBA1C 7.9 (H) 06/18/2021       Assessment & Plan:   Problem List Items Addressed This Visit     Insomnia    Encouraged good sleep hygiene such as dark, quiet room. No blue/green glowing lights such  as computer screens in bedroom. No alcohol or stimulants in evening. Cut down on caffeine as able. Regular exercise is helpful but not just prior to bed time. Uses Ambien very infrequently and knows not to take it with other sedating medications.       Hyperlipidemia    Encourage heart healthy diet such as MIND or DASH diet, increase exercise, avoid trans fats, simple carbohydrates and processed foods, consider a krill or fish or flaxseed oil cap daily.       Diabetes mellitus type 2 in obese (HCC)    hgba1c acceptable, minimize simple carbs. Increase exercise as tolerated. Continue current meds. But drop the Metformin from 4 tabs to 3 tabs daily. Started on Mounjaro 2.5 mg weekly for one month and then increase to 5 mg weekly. And then reevaluate.      Relevant Medications   tirzepatide (MOUNJARO) 2.5 MG/0.5ML Pen   tirzepatide [facility]) 5 MG/0.5ML Pen   Hypertension    Well controlled, no changes to meds. Encouraged heart healthy diet such as the DASH diet and exercise as tolerated.       Atrophic vaginitis    Adequate response to Premarin cream. Refill given today      Lumbar radiculopathy    The epidurals have worn off and she agrees to return to sports medicine for further treatment but will allo increase in Hydrocodone dose from 7.5 to 10 tabs. 1 tab po q 6 hours prn. Encouraged moist heat and gentle stretching as  tolerated. May try NSAIDs and prescription meds as directed and report if symptoms worsen or seek immediate care      Relevant Medications   FLUoxetine (PROZAC) 20 MG capsule   venlafaxine XR (EFFEXOR XR) 75 MG 24 hr capsule   Major depressive disorder, recurrent, mild (HCC)    She is doing better on Venlafaxine 75 mg daily and Fluoxetine 20 mg daily. She does note a drop in libido but is combating it with a supplement. No changes today. Uses Venlafaxine very infrequently      Relevant Medications   FLUoxetine (PROZAC) 20 MG capsule   venlafaxine XR (EFFEXOR XR) 75 MG 24 hr capsule      Meds ordered this encounter  Medications   tirzepatide (MOUNJARO) 2.5 MG/0.5ML Pen    Sig: Inject 2.5 mg into the skin once a week.    Dispense:  2 mL    Refill:  0   tirzepatide (MOUNJARO) 5 MG/0.5ML Pen    Sig: Inject 5 mg into the skin once a week.    Dispense:  6 mL    Refill:  0    Start after 2.5 mg dose is completed   FLUoxetine (PROZAC) 20 MG capsule    Sig: Take 1 capsule (20 mg total) by mouth daily. 40 mg po daily x 7 days then drop to 20 mg po daily    Dispense:  90 capsule    Refill:  1   venlafaxine XR (EFFEXOR XR) 75 MG 24 hr capsule    Sig: Take 1 capsule (75 mg total) by mouth daily with breakfast.    Dispense:  90 capsule    Refill:  1   HYDROcodone-acetaminophen (NORCO) 10-325 MG tablet    Sig: Take 1 tablet by mouth every 8 (eight) hours as needed for up to 5 days.    Dispense:  100 tablet    Refill:  0   conjugated estrogens (PREMARIN) vaginal cream    Sig: Apply small amount to  affected area twice a week    Dispense:  42.5 g    Refill:  1    I discussed the assessment and treatment plan with the patient. The patient was provided an opportunity to ask questions and all were answered. The patient agreed with the plan and demonstrated an understanding of the instructions.   The patient was advised to call back or seek an in-person evaluation if the symptoms worsen or  if the condition fails to improve as anticipated.  I provided 45 minutes of face-to-face time during this encounter.   Danise Edge, MD [facility] at [facility] [phone] (phone) [phone] (fax)  [facility]   I, Billie Lade, acting as a scribe for Danise Edge, MD, have documented all relevent documentation on behalf of Danise Edge, MD, as directed by Danise Edge, MD while in the presence of Danise Edge, MD. DO:07/25/21.  I, [name], MD personally performed the services described in this documentation. All medical record entries made by the scribe were at my direction and in my presence. I have reviewed the chart and agree that the record reflects my personal performance and is accurate and complete

## 2021-07-25 NOTE — Assessment & Plan Note (Signed)
hgba1c acceptable, minimize simple carbs. Increase exercise as tolerated. Continue current meds. But drop the Metformin from 4 tabs to 3 tabs daily. Started on Mounjaro 2.5 mg weekly for one month and then increase to 5 mg weekly. And then reevaluate. 

## 2021-07-25 NOTE — Assessment & Plan Note (Signed)
Encouraged good sleep hygiene such as dark, quiet room. No blue/green glowing lights such as computer screens in bedroom. No alcohol or stimulants in evening. Cut down on caffeine as able. Regular exercise is helpful but not just prior to bed time. Uses Ambien very infrequently and knows not to take it with other sedating medications.

## 2021-07-25 NOTE — Assessment & Plan Note (Signed)
Well controlled, no changes to meds. Encouraged heart healthy diet such as the DASH diet and exercise as tolerated.  °

## 2021-07-25 NOTE — Assessment & Plan Note (Addendum)
The epidurals have worn off and she agrees to return to sports medicine for further treatment but will allo increase in Hydrocodone dose from 7.5 to 10 tabs. 1 tab po q 6 hours prn. Encouraged moist heat and gentle stretching as tolerated. May try NSAIDs and prescription meds as directed and report if symptoms worsen or seek immediate care

## 2021-07-25 NOTE — Assessment & Plan Note (Signed)
Encourage heart healthy diet such as MIND or DASH diet, increase exercise, avoid trans fats, simple carbohydrates and processed foods, consider a krill or fish or flaxseed oil cap daily.  °

## 2021-07-30 ENCOUNTER — Telehealth: Payer: 59 | Admitting: Medical

## 2021-08-13 ENCOUNTER — Ambulatory Visit (HOSPITAL_BASED_OUTPATIENT_CLINIC_OR_DEPARTMENT_OTHER): Payer: 59

## 2021-08-14 ENCOUNTER — Other Ambulatory Visit: Payer: Self-pay | Admitting: Family Medicine

## 2021-08-14 ENCOUNTER — Telehealth: Payer: Self-pay

## 2021-08-14 MED ORDER — ADDERALL XR 30 MG PO CP24: 60.0000 mg | ORAL_CAPSULE | ORAL | 0 refills | Status: DC

## 2021-08-14 NOTE — Telephone Encounter (Signed)
Pt's insurance requires brand name Adderall XR 30mg - can you send for brand name only please?

## 2021-08-15 NOTE — Telephone Encounter (Signed)
Brand name sent in by PCP.

## 2021-09-09 ENCOUNTER — Other Ambulatory Visit: Payer: Self-pay | Admitting: Family Medicine

## 2021-09-09 ENCOUNTER — Telehealth: Payer: Self-pay

## 2021-09-09 DIAGNOSIS — M25562 Pain in left knee: Secondary | ICD-10-CM

## 2021-09-09 MED ORDER — HYDROCODONE-ACETAMINOPHEN 5-325 MG PO TABS: 1.0000 | ORAL_TABLET | Freq: Four times a day (QID) | ORAL | 0 refills | Status: DC | PRN

## 2021-09-09 NOTE — Telephone Encounter (Signed)
Caller Name Andrews Phone Number [phone] Patient Name Nicole Ball Patient DOB [birth year] Call Type Message Only Information Provided Reason for Call Medication Question / Request Initial Comment Caller states she needs a prescription of her pain meds renewed for norco. Additional Comment Office hours provided. Caller states she will call back later, declined triage. Disp. Time Disposition Final User 09/09/2021 12:20:54 PM General Information Provided Yes Franz Dell Call Closed By: Franz Dell Transaction Date/Time: 09/09/2021 12:16:11 PM (ET)

## 2021-09-10 ENCOUNTER — Other Ambulatory Visit: Payer: Self-pay | Admitting: Family Medicine

## 2021-09-10 NOTE — Telephone Encounter (Signed)
Medication:  HYDROcodone-acetaminophen (NORCO) 10-325 MG tablet    Has the patient contacted their pharmacy? No. (If no, request that the patient contact the pharmacy for the refill.) (If yes, when and what did the pharmacy advise?)     Preferred Pharmacy (with phone number or street name):  [id number] - [place], [place] - [address] AT [facility] OF [address] [place] [address]  Phone:  [phone]  Fax:  [phone]    Agent: Please be advised that RX refills may take up to 3 business days. We ask that you follow-up with your pharmacy.   Med refill was done 1/30 for 5-325 pt is requesting 10-325. Please advise.

## 2021-09-11 MED ORDER — HYDROCODONE-ACETAMINOPHEN 10-325 MG PO TABS: 1.0000 | ORAL_TABLET | Freq: Three times a day (TID) | ORAL | 0 refills | Status: AC | PRN

## 2021-09-11 NOTE — Addendum Note (Signed)
Addended by: Randolm Idol A on: 09/11/2021 08:22 AM   Modules accepted: Orders

## 2021-09-17 ENCOUNTER — Inpatient Hospital Stay (HOSPITAL_BASED_OUTPATIENT_CLINIC_OR_DEPARTMENT_OTHER): Admission: RE | Admit: 2021-09-17 | Payer: 59 | Source: Ambulatory Visit

## 2021-09-17 ENCOUNTER — Encounter (HOSPITAL_BASED_OUTPATIENT_CLINIC_OR_DEPARTMENT_OTHER): Payer: Self-pay

## 2021-09-18 ENCOUNTER — Other Ambulatory Visit: Payer: Self-pay | Admitting: Family Medicine

## 2021-09-18 ENCOUNTER — Other Ambulatory Visit: Payer: Self-pay | Admitting: Cardiology

## 2021-09-18 ENCOUNTER — Telehealth: Payer: Self-pay | Admitting: Family Medicine

## 2021-09-18 MED ORDER — PROPRANOLOL HCL 60 MG PO TABS: ORAL_TABLET | ORAL | 0 refills | Status: DC

## 2021-09-18 NOTE — Telephone Encounter (Signed)
Patient would like to know if Dr. Abner Greenspan could refill her propranolol (INDERAL) 60 MG tablet  since she is going out of town and really needs it. She was made aware that the medication was prescribed by another provider, and she would need to call them but pt stated she still wanted a message sent. Please advise.   [id number] [place],  - [address] AT [facility] OF [address], [place] [address]  Phone:  [phone]  Fax:  [phone]

## 2021-09-19 NOTE — Telephone Encounter (Signed)
Refilled yesterday. 

## 2021-10-01 ENCOUNTER — Encounter (HOSPITAL_BASED_OUTPATIENT_CLINIC_OR_DEPARTMENT_OTHER): Payer: Self-pay

## 2021-10-01 ENCOUNTER — Ambulatory Visit (HOSPITAL_BASED_OUTPATIENT_CLINIC_OR_DEPARTMENT_OTHER): Payer: 59

## 2021-10-06 ENCOUNTER — Other Ambulatory Visit: Payer: Self-pay | Admitting: Cardiology

## 2021-10-06 ENCOUNTER — Encounter: Payer: Self-pay | Admitting: Medical

## 2021-10-06 ENCOUNTER — Other Ambulatory Visit: Payer: Self-pay | Admitting: Family Medicine

## 2021-10-06 ENCOUNTER — Encounter: Payer: Self-pay | Admitting: Family Medicine

## 2021-10-07 MED ORDER — MUPIROCIN 2 % EX OINT: TOPICAL_OINTMENT | CUTANEOUS | 1 refills | Status: DC

## 2021-10-07 MED ORDER — ALPRAZOLAM 1 MG PO TABS: 1.0000 mg | ORAL_TABLET | Freq: Two times a day (BID) | ORAL | 1 refills | Status: DC | PRN

## 2021-10-07 NOTE — Telephone Encounter (Signed)
Sending to DOD , in absence of PCP    Requesting: xanax Contract:N/A UDS:12/10/20 Last Visit:07/25/21 Next Visit:12/23/21 Last Refill:06/2021  Please Advise

## 2021-10-17 ENCOUNTER — Telehealth: Payer: Self-pay | Admitting: Cardiology

## 2021-10-17 ENCOUNTER — Other Ambulatory Visit: Payer: Self-pay | Admitting: Cardiology

## 2021-10-17 MED ORDER — PROPRANOLOL HCL 60 MG PO TABS: 60.0000 mg | ORAL_TABLET | Freq: Two times a day (BID) | ORAL | 0 refills | Status: DC

## 2021-10-17 NOTE — Telephone Encounter (Signed)
Patient is wanting to know the status of her new machine being ordered. She states last time she spoke with the office in regards to it they advised her there was a shortage on them, but she hasn't heard anything since. Please advise.  ?

## 2021-10-21 ENCOUNTER — Telehealth: Payer: Self-pay

## 2021-10-21 ENCOUNTER — Other Ambulatory Visit: Payer: Self-pay

## 2021-10-21 ENCOUNTER — Other Ambulatory Visit (HOSPITAL_BASED_OUTPATIENT_CLINIC_OR_DEPARTMENT_OTHER): Payer: Self-pay

## 2021-10-21 ENCOUNTER — Telehealth: Payer: Self-pay | Admitting: Family Medicine

## 2021-10-21 DIAGNOSIS — E1169 Type 2 diabetes mellitus with other specified complication: Secondary | ICD-10-CM

## 2021-10-21 MED ORDER — TIRZEPATIDE 7.5 MG/0.5ML ~~LOC~~ SOAJ
7.5000 mg | SUBCUTANEOUS | 0 refills | Status: DC
  Filled 2021-10-21 (×2): qty 2, 28d supply, fill #0

## 2021-10-21 NOTE — Telephone Encounter (Signed)
Pt came in office stating is taking tirzepatide [facility]) 5 MG/0.5ML Pen,  and states that the injection is working very well and would like to have the dose higher for next rx, please advise and send rx to [facility]. Pt also needing refill Hydrocodine sent to [facility] since pt states at [facility] there meds is not available. Any question call appt at [phone].  ?

## 2021-10-21 NOTE — Telephone Encounter (Signed)
Caller Name [name] ?Caller Phone Number [phone] ?Patient Name Nicole Ball ?Patient DOB [birth year] ?Call Type Message Only Information Provided ?Reason for Call Request for General Office Information ?Initial Comment Caller states her Adderall is back ordered at her pharmacy but she has found a pharmacy that ?has them. She needs a new rx. Also states she thinks she may need an increase on one of her ?anti depressants. ?Disp. Time Disposition Final User ?10/19/2021 1:01:25 PM General Information Provided Yes Ardelle Anton ?Call Closed By: Ardelle Anton ?Transaction Date/Time: 10/19/2021 12:56:08 PM (ET) ?

## 2021-10-22 NOTE — Telephone Encounter (Signed)
Message will be sent to Veda Canning. Looks like she had last contact with the patient. ?

## 2021-10-22 NOTE — Telephone Encounter (Signed)
Refill sent.

## 2021-10-24 ENCOUNTER — Other Ambulatory Visit: Payer: Self-pay | Admitting: Family Medicine

## 2021-10-24 ENCOUNTER — Encounter: Payer: Self-pay | Admitting: Family Medicine

## 2021-10-24 ENCOUNTER — Other Ambulatory Visit: Payer: Self-pay

## 2021-10-24 ENCOUNTER — Other Ambulatory Visit (HOSPITAL_BASED_OUTPATIENT_CLINIC_OR_DEPARTMENT_OTHER): Payer: Self-pay

## 2021-10-24 DIAGNOSIS — F909 Attention-deficit hyperactivity disorder, unspecified type: Secondary | ICD-10-CM

## 2021-10-24 DIAGNOSIS — M25562 Pain in left knee: Secondary | ICD-10-CM

## 2021-10-24 MED ORDER — ADDERALL XR 30 MG PO CP24
60.0000 mg | ORAL_CAPSULE | ORAL | 0 refills | Status: DC
  Filled 2021-10-24: qty 60, 30d supply, fill #0

## 2021-10-24 MED ORDER — HYDROCODONE-ACETAMINOPHEN 5-325 MG PO TABS
1.0000 | ORAL_TABLET | Freq: Four times a day (QID) | ORAL | 0 refills | Status: DC | PRN
  Filled 2021-10-24: qty 100, 25d supply, fill #0

## 2021-10-24 MED ORDER — ADDERALL XR 30 MG PO CP24: 60.0000 mg | ORAL_CAPSULE | ORAL | 0 refills | Status: DC

## 2021-10-24 MED ORDER — HYDROCODONE-ACETAMINOPHEN 5-325 MG PO TABS: 1.0000 | ORAL_TABLET | Freq: Four times a day (QID) | ORAL | 0 refills | Status: DC | PRN

## 2021-10-24 NOTE — Telephone Encounter (Signed)
Refill requests sent to Dr. Abner Greenspan. ?

## 2021-10-24 NOTE — Progress Notes (Deleted)
Requesting: Adderall XR 30MG , Norco 7.5 - 325MG  ?Contract: n/a ?UDS: 12/10/20 ?Last Visit: 07/25/21 ?Next Visit: 12/23/21 ?Last Refill: adderall - 08/14/21, norco 07/22/21 ? ?Please Advise  ?

## 2021-10-29 ENCOUNTER — Telehealth: Payer: Self-pay | Admitting: Family Medicine

## 2021-10-29 NOTE — Telephone Encounter (Signed)
Called pt to resched an appt. Patient states theres a few things she needs to let us know/ ask for: ? ?Patient doesn't have insurance now. She is wondering if there is anyway we can give her samples of monjauro? ?Patient picked up pain meds(Norco/Vicodin) 5 325s without realizing. States it is usually the 10 325. Patient wanted to let us know just incase she is needing more sent in. ?Patient takes adderral per another Provider. Abner Greenspan will be taking on the medication since other Provider is leaving. That doctor listed adderrall needed to be name brand, which patient cannot afford since she does not have insurance now. Patient would like the name brand comment taking off of the medication scripts. She prefers to stay on adderrall as ritalin has not worked the same in the past & has made her feel different ?Patient is requesting everything be sent down stairs to our pharmacy. ?LASTLY, patient states she cannot get rid of head cold & wanting to know what to do. I advised patient we would probably need an appt.  ?

## 2021-10-30 ENCOUNTER — Other Ambulatory Visit: Payer: Self-pay | Admitting: Family Medicine

## 2021-10-30 NOTE — Telephone Encounter (Signed)
Reached out to patient after talking to [facility] who states patient has a large over do bill to pay before getting a new device and she needs an appointment with [place] and new Rx to get a new device. ?

## 2021-10-30 NOTE — Telephone Encounter (Signed)
Patient wants to switch to [facility] for her dme. Her machine was ordered 02-08-20 and sent to [facility] but because she had an overdue balance she never received her unit. She switched to [facility] but never followed up on her status. She will call [facility] to see if her Rx is still good and call our office back. I informed her she will likely need an office visit and a new Rx because so much time has passed. ?Patient agrees with treatment. ?

## 2021-11-01 ENCOUNTER — Other Ambulatory Visit (HOSPITAL_BASED_OUTPATIENT_CLINIC_OR_DEPARTMENT_OTHER): Payer: Self-pay

## 2021-11-01 ENCOUNTER — Other Ambulatory Visit: Payer: Self-pay

## 2021-11-01 DIAGNOSIS — F909 Attention-deficit hyperactivity disorder, unspecified type: Secondary | ICD-10-CM

## 2021-11-01 MED ORDER — AMPHETAMINE-DEXTROAMPHET ER 30 MG PO CP24
30.0000 mg | ORAL_CAPSULE | ORAL | 0 refills | Status: DC
  Filled 2021-11-01: qty 60, 30d supply, fill #0

## 2021-11-01 NOTE — Telephone Encounter (Signed)
Spoke with pt, pt has a few issues going on: ?  ?Pt has lost her job and is uninsured now. Pt is very stressed and was tearful on the phone. Pt would like to increase the dosage of her Prozac. She states she discussed this with you at a previous visit.  ? ?Pt also states that that she normally takes 10-325 Norco but her last refill was for 5-325s and she picked it up before she realized it was the 5s and not the 10s. She wanted to make you aware incase she needs more. She states she is in a lot of pain. ? ?Pt also states she recently came back from [place] and thinks she may have had Covid. She continues to have sinus issues and asked for an antibiotic. I recommended she at least have a virtual visit, she agreed and has a virtual visit with Percell Miller on Monday. ? ?Also, I have pended the generic adderall prescriptions to replace the name brand ones per patient request.  ?

## 2021-11-01 NOTE — Telephone Encounter (Signed)
Pt requesting generic Adderall instead of name brand.  ? ?[facility] called and stated they need a new prescription in order to change it to generic instead of name brand. Refill pended. ?

## 2021-11-02 ENCOUNTER — Other Ambulatory Visit: Payer: Self-pay | Admitting: Family Medicine

## 2021-11-02 MED ORDER — AMPHETAMINE-DEXTROAMPHET ER 30 MG PO CP24
60.0000 mg | ORAL_CAPSULE | ORAL | 0 refills | Status: DC
  Filled 2021-11-02 – 2021-11-04 (×2): qty 60, 30d supply, fill #0

## 2021-11-02 MED ORDER — FLUOXETINE HCL 40 MG PO CAPS
40.0000 mg | ORAL_CAPSULE | Freq: Every day | ORAL | 3 refills | Status: DC
  Filled 2021-11-02 – 2021-11-14 (×2): qty 30, 30d supply, fill #0
  Filled 2021-12-24: qty 30, 30d supply, fill #1
  Filled 2022-02-14: qty 30, 30d supply, fill #2

## 2021-11-02 MED ORDER — HYDROCODONE-ACETAMINOPHEN 10-325 MG PO TABS
1.0000 | ORAL_TABLET | Freq: Three times a day (TID) | ORAL | 0 refills | Status: DC | PRN
  Filled 2021-11-02 – 2021-11-14 (×2): qty 90, 30d supply, fill #0

## 2021-11-04 ENCOUNTER — Other Ambulatory Visit (HOSPITAL_BASED_OUTPATIENT_CLINIC_OR_DEPARTMENT_OTHER): Payer: Self-pay

## 2021-11-04 ENCOUNTER — Telehealth (INDEPENDENT_AMBULATORY_CARE_PROVIDER_SITE_OTHER): Payer: Self-pay | Admitting: Medical

## 2021-11-04 DIAGNOSIS — R059 Cough, unspecified: Secondary | ICD-10-CM

## 2021-11-04 DIAGNOSIS — R0981 Nasal congestion: Secondary | ICD-10-CM

## 2021-11-04 DIAGNOSIS — J0110 Acute frontal sinusitis, unspecified: Secondary | ICD-10-CM

## 2021-11-04 MED ORDER — BENZONATATE 100 MG PO CAPS: 100.0000 mg | ORAL_CAPSULE | Freq: Three times a day (TID) | ORAL | 0 refills | Status: DC | PRN

## 2021-11-04 MED ORDER — FLUTICASONE PROPIONATE 50 MCG/ACT NA SUSP: NASAL | 3 refills | Status: DC

## 2021-11-04 MED ORDER — AMOXICILLIN-POT CLAVULANATE 875-125 MG PO TABS
1.0000 | ORAL_TABLET | Freq: Two times a day (BID) | ORAL | 0 refills | Status: DC
  Filled 2021-11-04: qty 20, 10d supply, fill #0

## 2021-11-04 MED ORDER — FLUCONAZOLE 150 MG PO TABS: ORAL_TABLET | ORAL | 0 refills | Status: DC

## 2021-11-04 MED ORDER — FLUTICASONE PROPIONATE 50 MCG/ACT NA SUSP
NASAL | 3 refills | Status: DC
  Filled 2021-11-04 – 2021-11-14 (×2): qty 16, 30d supply, fill #0
  Filled 2022-02-14: qty 16, 30d supply, fill #1

## 2021-11-04 MED ORDER — AMOXICILLIN-POT CLAVULANATE 875-125 MG PO TABS: 1.0000 | ORAL_TABLET | Freq: Two times a day (BID) | ORAL | 0 refills | Status: DC

## 2021-11-04 MED ORDER — BENZONATATE 100 MG PO CAPS
100.0000 mg | ORAL_CAPSULE | Freq: Three times a day (TID) | ORAL | 0 refills | Status: DC | PRN
  Filled 2021-11-04: qty 30, 10d supply, fill #0

## 2021-11-04 MED ORDER — FLUCONAZOLE 150 MG PO TABS
ORAL_TABLET | ORAL | 0 refills | Status: DC
Start: 2021-11-04 — End: 2021-12-18
  Filled 2021-11-04: qty 2, 5d supply, fill #0

## 2021-11-04 NOTE — Progress Notes (Signed)
? ?  Subjective:  ? ? Patient ID: Nicole Ball, female    DOB: 04-26-1976, 46 y.o.   MRN: [id number] ? ?HPI ?Virtual Visit via Video Note ? ?I connected with [name] on 11/04/21 at 10:40 AM EDT by a video enabled telemedicine application and verified that I am speaking with the correct person using two identifiers. ? ?Location: ?Patient: home ?Provider: office ?  ?I discussed the limitations of evaluation and management by telemedicine and the availability of in person appointments. The patient expressed understanding and agreed to proceed. ? ?History of Present Illness: ? ?Pt states her sister came back from conference and she got sick after exposure to sister. Pt had body aches, fever, chills and runny nose. This was about one month ago. Pt never tested herself for covid. Pt state body aches, fever, and chills stopped but she developed sinus pressure,  and severe nasal congestion. She also notes her scalp is tender. Frontal sinus pain worse when bends over. ? ? ?On review she last had covid fe 2022. ? ?  ?Observations/Objective: ?General-no acute distress, pleasant, oriented. Sounds nasal congested. ?Lungs- on inspection lungs appear unlabored. ?Neck- no tracheal deviation or jvd on inspection. ?Neuro- gross motor function appears intact.  ?Heent- frontal sinus pressure on self palpation. ? ?Assessment and Plan: ?Patient Instructions  ?Your appear to have a sinus infection. I am prescribing augmentin antibiotic for the infection. To help with the nasal congestion I prescribed  flonase nasal steroid. For your associated cough, I prescribed cough medicine benzonatate. ? ?Rest, hydrate, tylenol for fever. ? ?Follow up in 7 days or as needed.   ? ? ?Esperanza Richters, PA-C  ? ? ? ?Time spent with patient today was 23  minutes which consisted of chart review, discussing diagnosis, work up treatment and documentation.  ?Follow Up Instructions: ? ?  ?I discussed the assessment and treatment plan with the patient.  The patient was provided an opportunity to ask questions and all were answered. The patient agreed with the plan and demonstrated an understanding of the instructions. ?  ?The patient was advised to call back or seek an in-person evaluation if the symptoms worsen or if the condition fails to improve as anticipated. ? ? ? ? ?Esperanza Richters, PA-C  ? ? ?Review of Systems  ?Constitutional:  Negative for fatigue and unexpected weight change.  ?HENT:  Positive for congestion and sinus pressure. Negative for postnasal drip, rhinorrhea, sinus pain, sore throat and voice change.   ?Respiratory:  Negative for cough, shortness of breath and wheezing.   ?Cardiovascular:  Negative for chest pain and palpitations.  ?Gastrointestinal:  Negative for abdominal pain.  ?Genitourinary:  Negative for dyspareunia and dysuria.  ?Musculoskeletal:  Negative for back pain and gait problem.  ?Neurological:  Negative for dizziness, facial asymmetry, speech difficulty, weakness and headaches.  ?Hematological:  Negative for adenopathy. Does not bruise/bleed easily.  ?Psychiatric/Behavioral:  Negative for behavioral problems.   ? ?   ?Objective:  ? Physical Exam ? ? ? ? ?   ?Assessment & Plan:  ? ? ?

## 2021-11-04 NOTE — Patient Instructions (Signed)
Your appear to have a sinus infection. I am prescribing augmentin  antibiotic for the infection. To help with the nasal congestion I prescribed flonase  nasal steroid. For your associated cough, I prescribed cough medicine benzonatate.  Rest, hydrate, tylenol for fever.  Follow up in 7 days or as needed. 

## 2021-11-11 ENCOUNTER — Other Ambulatory Visit (HOSPITAL_BASED_OUTPATIENT_CLINIC_OR_DEPARTMENT_OTHER): Payer: Self-pay

## 2021-11-12 ENCOUNTER — Other Ambulatory Visit: Payer: Self-pay | Admitting: Family Medicine

## 2021-11-12 DIAGNOSIS — E782 Mixed hyperlipidemia: Secondary | ICD-10-CM

## 2021-11-12 DIAGNOSIS — R000 Tachycardia, unspecified: Secondary | ICD-10-CM

## 2021-11-12 DIAGNOSIS — I4711 Inappropriate sinus tachycardia, so stated: Secondary | ICD-10-CM

## 2021-11-12 DIAGNOSIS — I10 Essential (primary) hypertension: Secondary | ICD-10-CM

## 2021-11-14 ENCOUNTER — Other Ambulatory Visit (HOSPITAL_BASED_OUTPATIENT_CLINIC_OR_DEPARTMENT_OTHER): Payer: Self-pay

## 2021-11-14 MED FILL — Venlafaxine HCl Cap ER 24HR 75 MG (Base Equivalent): ORAL | 30 days supply | Qty: 30 | Fill #0 | Status: AC

## 2021-11-19 ENCOUNTER — Other Ambulatory Visit: Payer: Self-pay | Admitting: Family Medicine

## 2021-11-26 ENCOUNTER — Other Ambulatory Visit (HOSPITAL_BASED_OUTPATIENT_CLINIC_OR_DEPARTMENT_OTHER): Payer: Self-pay

## 2021-11-26 ENCOUNTER — Telehealth: Payer: Self-pay | Admitting: Family Medicine

## 2021-11-26 NOTE — Telephone Encounter (Signed)
Pt called stating that she wanted to talk to Dr. Abner Greenspan or her nurse about a few things: ? ?Firstly, she stated that she needed refills on a few medications and that she needed them sent to the outpatient pharmacy downstairs due to insurance changes.  ? ?Medication:  ? ?propranolol (INDERAL) 60 MG tablet [[id number]]  ? ?metFORMIN (GLUCOPHAGE) 500 MG tablet [[id number]]  ? ?montelukast (SINGULAIR) 10 MG tablet [[id number]]  ? ?Has the patient contacted their pharmacy? Yes.   ?(If no, request that the patient contact the pharmacy for the refill.) ?(If yes, when and what did the pharmacy advise?) Contact PCP to reroute meds ? ?Preferred Pharmacy (with phone number or street name):  ? ?[facility] ?[address] ?[place],  [place]  [address] ?Phone: [phone] ? ?Agent: Please be advised that RX refills may take up to 3 business days. We ask that you follow-up with your pharmacy. ? ?Secondly, she stated that she was interested in some samples for [facility] as she has taken it before and was pleased with how well it worked in the past.  ? ?Please Advise. ? ?

## 2021-11-27 ENCOUNTER — Other Ambulatory Visit (HOSPITAL_BASED_OUTPATIENT_CLINIC_OR_DEPARTMENT_OTHER): Payer: Self-pay

## 2021-11-27 MED ORDER — MONTELUKAST SODIUM 10 MG PO TABS
ORAL_TABLET | ORAL | 1 refills | Status: DC
  Filled 2021-11-27: qty 90, 90d supply, fill #0
  Filled 2022-04-08: qty 90, 90d supply, fill #1

## 2021-11-27 MED ORDER — PROPRANOLOL HCL 60 MG PO TABS
60.0000 mg | ORAL_TABLET | Freq: Two times a day (BID) | ORAL | 1 refills | Status: DC
  Filled 2021-11-27: qty 60, 30d supply, fill #0
  Filled 2022-02-14: qty 180, 90d supply, fill #0
  Filled 2022-05-22: qty 20, 10d supply, fill #1
  Filled 2022-05-23: qty 160, 80d supply, fill #1

## 2021-11-27 MED ORDER — METFORMIN HCL 500 MG PO TABS
1000.0000 mg | ORAL_TABLET | Freq: Two times a day (BID) | ORAL | 3 refills | Status: DC
  Filled 2021-11-27: qty 360, 90d supply, fill #0
  Filled 2022-04-08: qty 360, 90d supply, fill #1

## 2021-11-27 NOTE — Telephone Encounter (Signed)
Spoke with pt, refills sent. ?

## 2021-11-28 ENCOUNTER — Other Ambulatory Visit (HOSPITAL_BASED_OUTPATIENT_CLINIC_OR_DEPARTMENT_OTHER): Payer: Self-pay

## 2021-11-29 ENCOUNTER — Other Ambulatory Visit (HOSPITAL_BASED_OUTPATIENT_CLINIC_OR_DEPARTMENT_OTHER): Payer: Self-pay

## 2021-12-03 NOTE — Progress Notes (Deleted)
Cardiology Office Note:    Date:  12/03/2021   ID:  Nicole Ball, DOB 1975/12/15, MRN [id number]  PCP:  Bradd CanaryBlyth, Stacey A, MD   [facility] Providers Cardiologist:  Tobias AlexanderKatarina Nelson, MD Sleep Medicine:  Armanda Magicraci Turner, MD {  Referring MD: Bradd CanaryBlyth, Stacey A, MD     History of Present Illness:    Nicole Ball is a 46 y.o. female with a hx of DMII, PCOS, early menopause, HTN, anxiety, fatty liver, gallbladder stones, sleep apnea, depression, migraines, obesity who presents for follow-up.  She was evaluated for palpitations and tachycardia in March 2016 deemed to be inappropriate sinus tachycardia. She was started on metoprolol. At visit on 03/2015 she was reporting some DOE and thus ETT recommended but never completed by patient. TTE 03/2015: EF 55-60%, no RWMA, mild LAE. She was also started on atorvastatin for hyperlipidemia. Was diagnosed with OSA and started on CPAP.  Was last seen by Dr. Delton SeeNelson in 12/2019 where she was doing okay. Was recovering from COVID.  Today, ***  Past Medical History:  Diagnosis Date   Abnormal serum level of alkaline phosphatase 05/21/2014   Acute low back pain 12/19/2010   ALLERGIC RHINITIS 10/16/2010   Anxiety and depression 06/07/2011   Atrophic vaginitis 08/17/2016   Cervical cancer screening 01/10/2014   Menarche at 8 Regular and moderate flow, became irregular until OCPs  history of abnormal pap in past, repeat was normal Last pap roughly 3 years ago G0P0, s/p No history of abnormal MGM, no h/o MGM  No concerns today no gyn surgeries LMP May 2013   CTS (carpal tunnel syndrome) 01/15/2014   Diabetes mellitus type 2 in obese (HCC) 10/30/2011   Dry eyes 08/17/2016   Early menopause    Essential hypertension    HIATAL HERNIA WITH REFLUX, HX OF 10/16/2010   Hyperlipidemia 04/21/2011   Inappropriate sinus tachycardia    INSOMNIA 10/16/2010   Knee pain, left 03/11/2012   Migraine 03/07/2013   Morbid obesity (HCC)    Palpitations 01/28/2015   Reflux 04/22/2012    Sleep apnea 06/07/2011   Tobacco use disorder 02/10/2017   Urinary urgency 08/17/2016   UTI'S, HX OF 10/16/2010    Past Surgical History:  Procedure Laterality Date   CHOLECYSTECTOMY      Current Medications: No outpatient medications have been marked as taking for the 12/09/21 encounter (Appointment) with Meriam SpraguePemberton, Lenzy Kerschner E, MD.     Allergies:   Patient has no known allergies.   Social History   Socioeconomic History   Marital status: Married    Spouse name: Not on file   Number of children: Not on file   Years of education: Not on file   Highest education level: Not on file  Occupational History   Not on file  Tobacco Use   Smoking status: Some Days    Packs/day: 0.10    Types: Cigars, Cigarettes   Smokeless tobacco: Never   Tobacco comments:    1 black and milds  Vaping Use   Vaping Use: Never used  Substance and Sexual Activity   Alcohol use: Yes    Comment: twice/year   Drug use: No   Sexual activity: Not on file  Other Topics Concern   Not on file  Social History Narrative   Married lives w/ husband, works at [employer]   Social Determinants of Corporate investment bankerHealth   Financial Resource Strain: Not on file  Food Insecurity: Not on file  Transportation Needs: Not on file  Physical  Activity: Not on file  Stress: Not on file  Social Connections: Not on file     Family History: The patient's ***family history includes Alcohol abuse in her father, maternal aunt, and maternal uncle; Allergies in her brother; Arthritis in her maternal grandmother and another family member; Asthma in her sister; Breast cancer in her paternal grandmother; Cancer in her father, maternal grandfather, and paternal grandmother; Cataracts in her maternal grandmother; Coronary artery disease in her maternal grandfather; Diabetes in her maternal grandfather and paternal grandfather; Glaucoma in her maternal grandmother; Lung disease in her mother; Lupus in her mother; Scleroderma in her maternal  grandmother; Thyroid disease in her paternal grandmother and sister.  ROS:   Please see the history of present illness.    *** All other systems reviewed and are negative.  EKGs/Labs/Other Studies Reviewed:    The following studies were reviewed today: TTE 02/03/20: IMPRESSIONS     1. Left ventricular ejection fraction, by estimation, is 55 to 60%. The  left ventricle has normal function. The left ventricle has no regional  wall motion abnormalities. Left ventricular diastolic parameters were  normal.   2. Right ventricular systolic function is normal. The right ventricular  size is normal. Tricuspid regurgitation signal is inadequate for assessing  PA pressure.   3. The mitral valve is grossly normal. Trivial mitral valve  regurgitation. No evidence of mitral stenosis.   4. The aortic valve is tricuspid. Aortic valve regurgitation is not  visualized. No aortic stenosis is present.   5. The inferior vena cava is normal in size with greater than 50%  respiratory variability, suggesting right atrial pressure of 3 mmHg.   Comparison(s): No significant change from prior study.   EKG:  EKG is *** ordered today.  The ekg ordered today demonstrates ***  Recent Labs: 06/18/2021: ALT 22; BUN 15; Creatinine, Ser 0.73; Hemoglobin 12.6; Platelets 215.0; Potassium 4.2; Sodium 137; TSH 1.14  Recent Lipid Panel    Component Value Date/Time   CHOL 227 (H) 06/18/2021 1633   CHOL 174 09/19/2019 1454   TRIG 227.0 (H) 06/18/2021 1633   HDL 78.80 06/18/2021 1633   HDL 65 09/19/2019 1454   CHOLHDL 3 06/18/2021 1633   VLDL 45.4 (H) 06/18/2021 1633   LDLCALC 76 12/10/2020 1504   LDLCALC 87 09/19/2019 1454   LDLDIRECT 44.0 06/18/2021 1633     Risk Assessment/Calculations:   {Does this patient have ATRIAL FIBRILLATION?[phone]}       Physical Exam:    VS:  LMP 01/08/2013     Wt Readings from Last 3 Encounters:  06/18/21 179 lb 9.6 oz (81.5 kg)  03/28/21 179 lb (81.2 kg)  03/05/21  180 lb (81.6 kg)     GEN: *** Well nourished, well developed in no acute distress HEENT: Normal NECK: No JVD; No carotid bruits LYMPHATICS: No lymphadenopathy CARDIAC: ***RRR, no murmurs, rubs, gallops RESPIRATORY:  Clear to auscultation without rales, wheezing or rhonchi  ABDOMEN: Soft, non-tender, non-distended MUSCULOSKELETAL:  No edema; No deformity  SKIN: Warm and dry NEUROLOGIC:  Alert and oriented x 3 PSYCHIATRIC:  Normal affect   ASSESSMENT:    No diagnosis found. PLAN:    In order of problems listed above:  #Inappropriate Sinus Tachycardia: -Continue propranolol 60mg  BID  #HTN: -Continue propranolol 60mg  BID -Continue losartan 50mg  daily  #HLD: -Continue crestor 5mg  3x/week  #DMII: -Continue metformin -Continue mounjaro      {Are you ordering a CV Procedure (e.g. stress test, cath, DCCV, TEE, etc)?   Press  F2        :[id number]}    Medication Adjustments/Labs and Tests Ordered: Current medicines are reviewed at length with the patient today.  Concerns regarding medicines are outlined above.  No orders of the defined types were placed in this encounter.  No orders of the defined types were placed in this encounter.   There are no Patient Instructions on file for this visit.   Signed, Meriam Sprague, MD  12/03/2021 1:07 PM    [facility]

## 2021-12-06 ENCOUNTER — Other Ambulatory Visit (HOSPITAL_BASED_OUTPATIENT_CLINIC_OR_DEPARTMENT_OTHER): Payer: Self-pay

## 2021-12-09 ENCOUNTER — Ambulatory Visit: Payer: 59 | Admitting: Cardiology

## 2021-12-18 ENCOUNTER — Other Ambulatory Visit (HOSPITAL_BASED_OUTPATIENT_CLINIC_OR_DEPARTMENT_OTHER): Payer: Self-pay

## 2021-12-18 ENCOUNTER — Other Ambulatory Visit: Payer: Self-pay | Admitting: Family Medicine

## 2021-12-18 ENCOUNTER — Other Ambulatory Visit: Payer: Self-pay | Admitting: Medical

## 2021-12-18 DIAGNOSIS — R000 Tachycardia, unspecified: Secondary | ICD-10-CM

## 2021-12-18 DIAGNOSIS — I10 Essential (primary) hypertension: Secondary | ICD-10-CM

## 2021-12-18 DIAGNOSIS — E782 Mixed hyperlipidemia: Secondary | ICD-10-CM

## 2021-12-18 MED ORDER — FLUCONAZOLE 150 MG PO TABS
ORAL_TABLET | ORAL | 0 refills | Status: DC
Start: 2021-12-18 — End: 2021-12-24
  Filled 2021-12-18: qty 2, 5d supply, fill #0

## 2021-12-18 MED ORDER — LOSARTAN POTASSIUM 50 MG PO TABS
50.0000 mg | ORAL_TABLET | Freq: Every day | ORAL | 0 refills | Status: DC
  Filled 2021-12-18: qty 90, 90d supply, fill #0

## 2021-12-18 MED ORDER — BACLOFEN 10 MG PO TABS
10.0000 mg | ORAL_TABLET | Freq: Three times a day (TID) | ORAL | 0 refills | Status: DC
  Filled 2021-12-18: qty 30, 10d supply, fill #0

## 2021-12-18 MED ORDER — GABAPENTIN 600 MG PO TABS
600.0000 mg | ORAL_TABLET | Freq: Three times a day (TID) | ORAL | 0 refills | Status: DC
Start: 2021-12-18 — End: 2022-02-14
  Filled 2021-12-18: qty 90, 30d supply, fill #0

## 2021-12-19 ENCOUNTER — Other Ambulatory Visit: Payer: Self-pay | Admitting: Family Medicine

## 2021-12-19 DIAGNOSIS — M5416 Radiculopathy, lumbar region: Secondary | ICD-10-CM

## 2021-12-19 DIAGNOSIS — M5441 Lumbago with sciatica, right side: Secondary | ICD-10-CM

## 2021-12-19 MED ORDER — HYDROCODONE-ACETAMINOPHEN 10-325 MG PO TABS
1.0000 | ORAL_TABLET | Freq: Three times a day (TID) | ORAL | 0 refills | Status: DC | PRN
  Filled 2021-12-19: qty 90, 30d supply, fill #0

## 2021-12-19 NOTE — Telephone Encounter (Signed)
Requesting: Norco 10-325 ?Contract: n/a ?UDS: 12/10/20 ?Last Visit: 11/04/21 ?Next Visit: 12/23/21 ?Last Refill: 11/02/21 ? ?Please Advise  ?

## 2021-12-19 NOTE — Telephone Encounter (Signed)
Medication: Hydrocodone ? ?Has the patient contacted their pharmacy? No. ? ?Preferred Pharmacy (with phone number or street name):  ?[facility]  ?[address], [place] [address]  ?Phone:  [phone]  Fax:  [phone]  ? ?Agent: Please be advised that RX refills may take up to 3 business days. We ask that you follow-up with your pharmacy.  ?

## 2021-12-20 ENCOUNTER — Other Ambulatory Visit (HOSPITAL_BASED_OUTPATIENT_CLINIC_OR_DEPARTMENT_OTHER): Payer: Self-pay

## 2021-12-23 ENCOUNTER — Other Ambulatory Visit (HOSPITAL_BASED_OUTPATIENT_CLINIC_OR_DEPARTMENT_OTHER): Payer: Self-pay

## 2021-12-23 ENCOUNTER — Ambulatory Visit: Payer: Self-pay | Admitting: Medical

## 2021-12-23 ENCOUNTER — Ambulatory Visit: Payer: 59 | Admitting: Family Medicine

## 2021-12-24 ENCOUNTER — Other Ambulatory Visit (HOSPITAL_BASED_OUTPATIENT_CLINIC_OR_DEPARTMENT_OTHER): Payer: Self-pay

## 2021-12-24 ENCOUNTER — Encounter: Payer: Self-pay | Admitting: Medical

## 2021-12-24 ENCOUNTER — Ambulatory Visit (INDEPENDENT_AMBULATORY_CARE_PROVIDER_SITE_OTHER): Payer: Commercial Managed Care - HMO | Admitting: Medical

## 2021-12-24 ENCOUNTER — Other Ambulatory Visit: Payer: Self-pay | Admitting: Family Medicine

## 2021-12-24 VITALS — BP 140/78 | HR 80 | Resp 18 | Ht 63.0 in | Wt 180.6 lb

## 2021-12-24 DIAGNOSIS — I10 Essential (primary) hypertension: Secondary | ICD-10-CM | POA: Diagnosis not present

## 2021-12-24 DIAGNOSIS — E669 Obesity, unspecified: Secondary | ICD-10-CM

## 2021-12-24 DIAGNOSIS — E782 Mixed hyperlipidemia: Secondary | ICD-10-CM

## 2021-12-24 DIAGNOSIS — F330 Major depressive disorder, recurrent, mild: Secondary | ICD-10-CM

## 2021-12-24 DIAGNOSIS — E1169 Type 2 diabetes mellitus with other specified complication: Secondary | ICD-10-CM

## 2021-12-24 DIAGNOSIS — B49 Unspecified mycosis: Secondary | ICD-10-CM

## 2021-12-24 DIAGNOSIS — G43009 Migraine without aura, not intractable, without status migrainosus: Secondary | ICD-10-CM

## 2021-12-24 DIAGNOSIS — M5416 Radiculopathy, lumbar region: Secondary | ICD-10-CM

## 2021-12-24 MED ORDER — FLUCONAZOLE 150 MG PO TABS
150.0000 mg | ORAL_TABLET | Freq: Every day | ORAL | 0 refills | Status: DC
  Filled 2021-12-24: qty 1, 1d supply, fill #0

## 2021-12-24 MED ORDER — PROMETHAZINE HCL 25 MG PO TABS
ORAL_TABLET | ORAL | 1 refills | Status: DC
  Filled 2021-12-24: qty 30, 10d supply, fill #0
  Filled 2022-02-14: qty 30, 10d supply, fill #1

## 2021-12-24 MED ORDER — TIRZEPATIDE 2.5 MG/0.5ML ~~LOC~~ SOAJ
2.5000 mg | SUBCUTANEOUS | 0 refills | Status: DC
  Filled 2021-12-24: qty 2, 28d supply, fill #0

## 2021-12-24 MED ORDER — BUTALBITAL-ACETAMINOPHEN 50-325 MG PO TABS
1.0000 | ORAL_TABLET | Freq: Two times a day (BID) | ORAL | 0 refills | Status: DC | PRN
  Filled 2021-12-24: qty 30, 15d supply, fill #0

## 2021-12-24 MED FILL — Venlafaxine HCl Cap ER 24HR 75 MG (Base Equivalent): ORAL | 30 days supply | Qty: 30 | Fill #1 | Status: AC

## 2021-12-24 NOTE — Patient Instructions (Addendum)
Diabetes- will get a1c and cmp today. Continue metformin and  rx mounjaro.  ? ?Htn- Losartan 50 mg bid and propanolol 60 mg bid. Borderline control. ? ?For high cholesterol on crestor 5 mg days a week. Check lipid panel. ? ?Depressive disorder- on effexor and prozac. ? ?Back pain- Pt is on norco 10/325 1 every 8 hours. Filled on 12/20/2021. ? ?Migraine headache history with some tension ha. Refilled butalbital. ? ?For ADD, anxiety and insomnia request you get refills of those meds from pcp.  ? ?For nausea rx'd phenergan ? ?Probable fungal irritation in the vulvar region.  Patient only took 1 tablet of Diflucan today.  Advised patient to repeat another tablet in 3 to 4 days if needed.  If despite treatment this persist let me know and would place referral to gynecologist.  Patient expressed understanding. ? ?Follow up in 6 months or sooner if needed. ? ? ?

## 2021-12-24 NOTE — Progress Notes (Addendum)
? ?Subjective:  ? ? Patient ID: Nicole Ball, female    DOB: [birth year], 46 y.o.   MRN: [id number] ? ?HPI ?Pt in for follow up. Could not coordinate appt with pcp due to recent job interview.  ? ? ?Pt has diabetes. Her a1c in November was 7.9. Pt is on metformin 500 2 tab bid.  ?At one point in past she was on monjaro.  Ran out about 6 weeks ago. Now has new insurance. ? ? ?Htn- bp high initially. Losartan 50 mg bid and propanolol 60 mg bid. ? ?For high cholesterol on crestor 5 mg days a week. ? ?Depressive disorder- on effexor and prozac. ? ?Back pain- most severe area of pain with some radicular pain. Pt is on norco 10/325 1 every 8 hours. Filled on 12/20/2021. ? ? ?Over past 6 months had more ha than usual. She describes migraine ha and more increase stress with loss of job. ? ? ?Recent vulvar region bump on both sides.  ?0 ?Review of Systems  ?Constitutional:  Negative for diaphoresis, fatigue and fever.  ?Respiratory:  Negative for cough, chest tightness, shortness of breath and wheezing.   ?Cardiovascular:  Negative for chest pain and palpitations.  ?Gastrointestinal:  Negative for abdominal pain, diarrhea, rectal pain and vomiting.  ?Musculoskeletal:  Positive for back pain. Negative for arthralgias, gait problem and myalgias.  ?Skin:  Negative for wound.  ?Neurological:   ?     Ha history.  ?Hematological:  Negative for adenopathy. Does not bruise/bleed easily.  ?Psychiatric/Behavioral:  Negative for behavioral problems and decreased concentration. The patient is not nervous/anxious.   ? ? ?Past Medical History:  ?Diagnosis Date  ? Abnormal serum level of alkaline phosphatase 05/21/2014  ? Acute low back pain 12/19/2010  ? ALLERGIC RHINITIS 10/16/2010  ? Anxiety and depression 06/07/2011  ? Atrophic vaginitis 08/17/2016  ? Cervical cancer screening 01/10/2014  ? Menarche at 8 Regular and moderate flow, became irregular until OCPs  history of abnormal pap in past, repeat was normal Last pap roughly 3 years ago  G0P0, s/p No history of abnormal MGM, no h/o MGM  No concerns today no gyn surgeries LMP May 2013  ? CTS (carpal tunnel syndrome) 01/15/2014  ? Diabetes mellitus type 2 in obese ([place]) 10/30/2011  ? Dry eyes 08/17/2016  ? Early menopause   ? Essential hypertension   ? HIATAL HERNIA WITH REFLUX, HX OF 10/16/2010  ? Hyperlipidemia 04/21/2011  ? Inappropriate sinus tachycardia   ? INSOMNIA 10/16/2010  ? Knee pain, left 03/11/2012  ? Migraine 03/07/2013  ? Morbid obesity ([place])   ? Palpitations 01/28/2015  ? Reflux 04/22/2012  ? Sleep apnea 06/07/2011  ? Tobacco use disorder 02/10/2017  ? Urinary urgency 08/17/2016  ? UTI'S, HX OF 10/16/2010  ? ?  ?Social History  ? ?Socioeconomic History  ? Marital status: Married  ?  Spouse name: Not on file  ? Number of children: Not on file  ? Years of education: Not on file  ? Highest education level: Not on file  ?Occupational History  ? Not on file  ?Tobacco Use  ? Smoking status: Some Days  ?  Packs/day: 0.10  ?  Types: Cigars, Cigarettes  ? Smokeless tobacco: Never  ? Tobacco comments:  ?  1 black and milds  ?Vaping Use  ? Vaping Use: Never used  ?Substance and Sexual Activity  ? Alcohol use: Yes  ?  Comment: twice/year  ? Drug use: No  ? Sexual activity: Not on  file  ?Other Topics Concern  ? Not on file  ?Social History Narrative  ? Married lives w/ husband, works at [employer]  ? ?Social Determinants of Health  ? ?Financial Resource Strain: Not on file  ?Food Insecurity: Not on file  ?Transportation Needs: Not on file  ?Physical Activity: Not on file  ?Stress: Not on file  ?Social Connections: Not on file  ?Intimate Partner Violence: Not on file  ? ? ?Past Surgical History:  ?Procedure Laterality Date  ? CHOLECYSTECTOMY    ? ? ?Family History  ?Problem Relation Age of Onset  ? Lupus Mother   ? Lung disease Mother   ? Cancer Father   ?     cigarette, alcohol HEENT squamous cell  ? Alcohol abuse Father   ? Thyroid disease Sister   ? Asthma Sister   ? Allergies Brother   ? Alcohol abuse Maternal  Aunt   ? Alcohol abuse Maternal Uncle   ? Arthritis Maternal Grandmother   ? Scleroderma Maternal Grandmother   ? Glaucoma Maternal Grandmother   ? Cataracts Maternal Grandmother   ? Cancer Maternal Grandfather   ?     prostate  ? Coronary artery disease Maternal Grandfather   ?     s/p angioplasty  ? Diabetes Maternal Grandfather   ?     Type 2  ? Cancer Paternal Grandmother   ?     Breast ca- dx in 50's  ? Thyroid disease Paternal Grandmother   ? Breast cancer Paternal Grandmother   ? Diabetes Paternal Grandfather   ? Arthritis Other   ? ? ?No Known Allergies ? ?Current Outpatient Medications on File Prior to Visit  ?Medication Sig Dispense Refill  ? ACETAMINOPHEN-BUTALBITAL 50-325 MG TABS Take 1 tablet by mouth 2 (two) times daily as needed. 60 tablet 0  ? albuterol (VENTOLIN HFA) 108 (90 Base) MCG/ACT inhaler Inhale 2 puffs into the lungs every 6 (six) hours as needed for wheezing or shortness of breath. 18 g 5  ? ALPRAZolam (XANAX) 1 MG tablet Take 1 tablet (1 mg total) by mouth 2 (two) times daily as needed for anxiety. 30 tablet 1  ? amphetamine-dextroamphetamine (ADDERALL XR) 30 MG 24 hr capsule Take 2 capsules (60 mg total) by mouth every morning. 60 capsule 0  ? azelastine (ASTELIN) 0.1 % nasal spray Place 2 sprays into both nostrils 2 (two) times daily. Use in each nostril as directed 30 mL 12  ? baclofen (LIORESAL) 10 MG tablet Take 1 tablet (10 mg total) by mouth 3 (three) times daily. 30 tablet 0  ? benzonatate (TESSALON) 100 MG capsule Take 1 capsule (100 mg total) by mouth 3 (three) times daily as needed for cough. 30 capsule 0  ? BLACK COHOSH PO Take 1 tablet by mouth daily.    ? celecoxib (CELEBREX) 200 MG capsule One to 2 tablets by mouth daily as needed for pain. 60 capsule 2  ? conjugated estrogens (PREMARIN) vaginal cream Apply small amount to affected area twice a week 42.5 g 1  ? fluconazole (DIFLUCAN) 150 MG tablet Take1 tablet by mouth daily and repeat in 5 days if needed. 2 tablet 0  ?  FLUoxetine (PROZAC) 40 MG capsule Take 1 capsule (40 mg total) by mouth daily. 30 capsule 3  ? fluticasone (FLONASE) 50 MCG/ACT nasal spray SHAKE LIQUID AND USE 2 SPRAYS IN EACH NOSTRIL DAILY 16 g 3  ? fluticasone (FLOVENT HFA) 220 MCG/ACT inhaler Inhale 1 puff into the lungs in the  morning and at bedtime. 1 each 1  ? gabapentin (NEURONTIN) 600 MG tablet Take 1 tablet (600 mg total) by mouth 3 (three) times daily. 90 tablet 0  ? Ginger, Zingiber officinalis, (GINGER PO) Take 1 tablet by mouth daily.    ? glucose blood (ONETOUCH VERIO) test strip 1 each by Other route daily in the afternoon. Use as instructed 100 each 6  ? HYDROcodone-acetaminophen (NORCO) 10-325 MG tablet Take 1 tablet by mouth every 8 (eight) hours as needed. 90 tablet 0  ? HYDROCORTISONE, TOPICAL, 2 % LOTN Apply 1 Dose topically daily as needed. 59.2 mL 1  ? hyoscyamine (LEVSIN SL) 0.125 MG SL tablet Take 1 tablet (0.125 mg total) by mouth every 4 (four) hours as needed. 30 tablet 0  ? levocetirizine (XYZAL) 5 MG tablet Take 1 tablet (5 mg total) by mouth in the morning and at bedtime. 60 tablet 5  ? losartan (COZAAR) 50 MG tablet Take 1 tablet (50 mg total) by mouth daily. 90 tablet 0  ? metFORMIN (GLUCOPHAGE) 500 MG tablet Take 2 tablets (1,000 mg total) by mouth 2 (two) times daily. 360 tablet 3  ? montelukast (SINGULAIR) 10 MG tablet TAKE 1 TABLET(10 MG) BY MOUTH AT BEDTIME 90 tablet 1  ? mupirocin ointment (BACTROBAN) 2 % APPLY VIA CLEAN Q-TIP TO NARES EVERY NIGHT AT BEDTIME 22 g 1  ? omeprazole (PRILOSEC) 20 MG capsule TAKE 1 CAPSULE(20 MG) BY MOUTH TWICE DAILY BEFORE A MEAL 180 capsule 1  ? ondansetron (ZOFRAN) 4 MG tablet TAKE 1 TABLET(4 MG) BY MOUTH EVERY 8 HOURS AS NEEDED FOR NAUSEA OR VOMITING 40 tablet 1  ? Probiotic Product (PROBIOTIC PO) Take 1 tablet by mouth daily.    ? promethazine (PHENERGAN) 25 MG tablet TAKE 1 TABLET(25 MG) BY MOUTH EVERY 8 HOURS AS NEEDED FOR NAUSEA OR VOMITING 30 tablet 1  ? propranolol (INDERAL) 60 MG tablet  TAKE 1 TABLET(60 MG) BY MOUTH TWICE DAILY. 180 tablet 1  ? rosuvastatin (CRESTOR) 5 MG tablet TAKE 1 TABLET(5 MG) BY MOUTH 3 TIMES A WEEK 36 tablet 3  ? sodium chloride (OCEAN) 0.65 % SOLN nasal spray Pla

## 2021-12-25 ENCOUNTER — Telehealth: Payer: Self-pay

## 2021-12-25 LAB — COMPREHENSIVE METABOLIC PANEL
ALT: 31 U/L (ref 0–35)
AST: 22 U/L (ref 0–37)
Albumin: 4.6 g/dL (ref 3.5–5.2)
Alkaline Phosphatase: 140 U/L — ABNORMAL HIGH (ref 39–117)
BUN: 12 mg/dL (ref 6–23)
CO2: 24 mEq/L (ref 19–32)
Calcium: 10.1 mg/dL (ref 8.4–10.5)
Chloride: 100 mEq/L (ref 96–112)
Creatinine, Ser: 0.78 mg/dL (ref 0.40–1.20)
GFR: 90.35 mL/min (ref >60.00)
Glucose, Bld: 201 mg/dL — ABNORMAL HIGH (ref 70–99)
Potassium: 3.7 mEq/L (ref 3.5–5.1)
Sodium: 134 mEq/L — ABNORMAL LOW (ref 135–145)
Total Bilirubin: 0.4 mg/dL (ref 0.2–1.2)
Total Protein: 7.8 g/dL (ref 6.0–8.3)

## 2021-12-25 LAB — LIPID PANEL
Cholesterol: 210 mg/dL — ABNORMAL HIGH (ref 0–200)
HDL: 74.2 mg/dL (ref >39.00)
LDL Cholesterol: 105 mg/dL — ABNORMAL HIGH (ref 0–99)
NonHDL: 135.48
Total CHOL/HDL Ratio: 3
Triglycerides: 153 mg/dL — ABNORMAL HIGH (ref 0.0–149.0)
VLDL: 30.6 mg/dL (ref 0.0–40.0)

## 2021-12-25 LAB — HEMOGLOBIN A1C: Hgb A1c MFr Bld: 7.9 % — ABNORMAL HIGH (ref 4.6–6.5)

## 2021-12-25 MED ORDER — TRULICITY 0.75 MG/0.5ML ~~LOC~~ SOAJ
SUBCUTANEOUS | 0 refills | Status: DC
  Filled 2021-12-25 – 2022-02-24 (×2): qty 2, 28d supply, fill #0

## 2021-12-25 NOTE — Telephone Encounter (Signed)
Requesting: alprazolam ?Contract:  ?UDS: 12/10/20 ?Last Visit: 12/24/21 ?Next Visit: 07/17/22 ?Last Refill: 10/07/21 ? ?Please Advise ? ?

## 2021-12-25 NOTE — Telephone Encounter (Signed)
Patient's insurance doesn't cover mounjaro ? ?But covers  ? ? ? 1) Byetta or Bydureon; and 2) Trulicity. ?

## 2021-12-25 NOTE — Addendum Note (Signed)
Addended by: Gwenevere Abbot on: 12/25/2021 09:10 PM ? ? Modules accepted: Orders ? ?

## 2021-12-26 ENCOUNTER — Other Ambulatory Visit (HOSPITAL_BASED_OUTPATIENT_CLINIC_OR_DEPARTMENT_OTHER): Payer: Self-pay

## 2021-12-26 NOTE — Telephone Encounter (Signed)
Pt.notified

## 2022-01-01 ENCOUNTER — Other Ambulatory Visit: Payer: Self-pay

## 2022-01-01 MED ORDER — SUMATRIPTAN SUCCINATE 50 MG PO TABS: ORAL_TABLET | ORAL | 3 refills | Status: DC

## 2022-01-03 ENCOUNTER — Other Ambulatory Visit (HOSPITAL_BASED_OUTPATIENT_CLINIC_OR_DEPARTMENT_OTHER): Payer: Self-pay

## 2022-01-07 ENCOUNTER — Other Ambulatory Visit (HOSPITAL_BASED_OUTPATIENT_CLINIC_OR_DEPARTMENT_OTHER): Payer: Self-pay

## 2022-01-07 ENCOUNTER — Other Ambulatory Visit: Payer: Self-pay | Admitting: Family Medicine

## 2022-01-07 NOTE — Telephone Encounter (Signed)
Requesting: adderall xr 30mg  Contract: n/a UDS: 12/10/20 Last Visit: 12/24/21 Next Visit: 07/17/22 Last Refill: 11/02/21  Please Advise

## 2022-01-07 NOTE — Telephone Encounter (Signed)
Pt states she needs rewritten so she can get generic of adderall. She is hoping to pick this up today as she is going out of town tomorrow.   [id number] [place], [place] - [address] AT [facility] OF [address], [place] [address]  Phone:  [phone]  Fax:  [phone]

## 2022-01-08 ENCOUNTER — Other Ambulatory Visit (HOSPITAL_BASED_OUTPATIENT_CLINIC_OR_DEPARTMENT_OTHER): Payer: Self-pay

## 2022-01-08 MED ORDER — AMPHETAMINE-DEXTROAMPHET ER 30 MG PO CP24: 60.0000 mg | ORAL_CAPSULE | ORAL | 0 refills | Status: DC

## 2022-01-09 ENCOUNTER — Other Ambulatory Visit: Payer: Self-pay | Admitting: Family Medicine

## 2022-01-09 NOTE — Telephone Encounter (Signed)
Pt called stating she had to go out of town due to family emergency and needs a refill on her adderall where she currently is. This will be a one-time fill as she stated she should be back the next time it's filled.  Medication:   amphetamine-dextroamphetamine (ADDERALL XR) 30 MG 24 hr capsule [[id number]]   Has the patient contacted their pharmacy? No. (If no, request that the patient contact the pharmacy for the refill.) (If yes, when and what did the pharmacy advise?)  Preferred Pharmacy (with phone number or street name):   [facility] [address] [place], [place] [address] Phone: [phone]  Agent: Please be advised that RX refills may take up to 3 business days. We ask that you follow-up with your pharmacy.

## 2022-01-10 NOTE — Telephone Encounter (Signed)
Pt states she had to go out of town for a family emergency and needs the prescription resent.  I have called [facility] and canceled prescription.  Please send to:  [facility] [address] [place], [place] [address]   Requesting: adderall xr 30mg  Contract: n/a UDS: 12/10/20 Last Visit: 12/24/21 Next Visit: 07/17/22 Last Refill: 11/02/21

## 2022-01-11 ENCOUNTER — Encounter: Payer: Self-pay | Admitting: Family Medicine

## 2022-01-13 MED ORDER — AMPHETAMINE-DEXTROAMPHET ER 30 MG PO CP24: 60.0000 mg | ORAL_CAPSULE | ORAL | 0 refills | Status: DC

## 2022-01-14 ENCOUNTER — Telehealth: Payer: Self-pay

## 2022-01-14 NOTE — Telephone Encounter (Signed)
Prior auth sent for adderall xr 30mg . Awaiting response from insurance company.

## 2022-01-17 NOTE — Telephone Encounter (Signed)
Called [employer]. Was on hold for 30 minutes. [employer]ann Auerbach states they need medical records documenting ADHD. I have faxed medical records to [facility]. Waiting for a response.

## 2022-01-22 NOTE — Telephone Encounter (Signed)
Approvedtoday [id number];Review Type:Qty;Coverage Start Date:01/14/2022;Coverage End Date:01/23/2023;

## 2022-01-24 ENCOUNTER — Other Ambulatory Visit (HOSPITAL_BASED_OUTPATIENT_CLINIC_OR_DEPARTMENT_OTHER): Payer: Self-pay

## 2022-01-24 ENCOUNTER — Encounter: Payer: Self-pay | Admitting: Family Medicine

## 2022-01-24 DIAGNOSIS — M5416 Radiculopathy, lumbar region: Secondary | ICD-10-CM

## 2022-01-24 DIAGNOSIS — M5441 Lumbago with sciatica, right side: Secondary | ICD-10-CM

## 2022-01-27 ENCOUNTER — Other Ambulatory Visit (HOSPITAL_BASED_OUTPATIENT_CLINIC_OR_DEPARTMENT_OTHER): Payer: Self-pay

## 2022-01-27 MED ORDER — HYDROCODONE-ACETAMINOPHEN 10-325 MG PO TABS: 1.0000 | ORAL_TABLET | Freq: Three times a day (TID) | ORAL | 0 refills | Status: DC | PRN

## 2022-01-27 NOTE — Telephone Encounter (Signed)
Patient called office again , inquiring about medication refill    Requesting: NORCO  Contract: N/A UDS:12/30/20 Last Visit:12/24/21 Next Visit:07/17/22 Last Refill:12/19/21  Please Advise

## 2022-01-30 ENCOUNTER — Other Ambulatory Visit: Payer: Self-pay | Admitting: Family Medicine

## 2022-01-30 ENCOUNTER — Telehealth: Payer: Self-pay | Admitting: *Deleted

## 2022-01-30 DIAGNOSIS — M5416 Radiculopathy, lumbar region: Secondary | ICD-10-CM

## 2022-01-30 DIAGNOSIS — M5441 Lumbago with sciatica, right side: Secondary | ICD-10-CM

## 2022-01-30 MED ORDER — HYDROCODONE-ACETAMINOPHEN 10-325 MG PO TABS: 1.0000 | ORAL_TABLET | Freq: Three times a day (TID) | ORAL | 0 refills | Status: DC | PRN

## 2022-01-30 NOTE — Telephone Encounter (Signed)
Patient notified that rx was resent.

## 2022-01-30 NOTE — Telephone Encounter (Signed)
Can you please resend hydrocodone script to pharmacy in [place].  The rx did not transmit to the pharmacy.

## 2022-02-03 ENCOUNTER — Encounter: Payer: Commercial Managed Care - HMO | Admitting: Cardiology

## 2022-02-03 DIAGNOSIS — G4733 Obstructive sleep apnea (adult) (pediatric): Secondary | ICD-10-CM

## 2022-02-03 DIAGNOSIS — I10 Essential (primary) hypertension: Secondary | ICD-10-CM

## 2022-02-14 ENCOUNTER — Other Ambulatory Visit: Payer: Self-pay | Admitting: Medical

## 2022-02-14 ENCOUNTER — Other Ambulatory Visit (HOSPITAL_BASED_OUTPATIENT_CLINIC_OR_DEPARTMENT_OTHER): Payer: Self-pay

## 2022-02-14 ENCOUNTER — Other Ambulatory Visit: Payer: Self-pay | Admitting: Family Medicine

## 2022-02-17 ENCOUNTER — Other Ambulatory Visit (HOSPITAL_BASED_OUTPATIENT_CLINIC_OR_DEPARTMENT_OTHER): Payer: Self-pay

## 2022-02-17 MED ORDER — FLUTICASONE PROPIONATE 50 MCG/ACT NA SUSP
NASAL | 3 refills | Status: DC
  Filled 2022-02-17: qty 16, 30d supply, fill #0
  Filled 2022-04-08: qty 16, 30d supply, fill #1
  Filled 2022-08-07 – 2022-08-12 (×2): qty 16, 30d supply, fill #2

## 2022-02-17 MED ORDER — GABAPENTIN 600 MG PO TABS
600.0000 mg | ORAL_TABLET | Freq: Three times a day (TID) | ORAL | 0 refills | Status: DC
Start: 2022-02-17 — End: 2022-05-28
  Filled 2022-02-17: qty 270, 90d supply, fill #0

## 2022-02-17 MED ORDER — FLUOXETINE HCL 40 MG PO CAPS
40.0000 mg | ORAL_CAPSULE | Freq: Every day | ORAL | 0 refills | Status: DC
  Filled 2022-02-17: qty 90, 90d supply, fill #0

## 2022-02-24 ENCOUNTER — Other Ambulatory Visit (HOSPITAL_BASED_OUTPATIENT_CLINIC_OR_DEPARTMENT_OTHER): Payer: Self-pay

## 2022-02-24 MED FILL — Venlafaxine HCl Cap ER 24HR 75 MG (Base Equivalent): ORAL | 90 days supply | Qty: 90 | Fill #0 | Status: AC

## 2022-03-03 ENCOUNTER — Other Ambulatory Visit: Payer: Self-pay

## 2022-03-03 ENCOUNTER — Other Ambulatory Visit: Payer: Self-pay | Admitting: Family Medicine

## 2022-03-03 MED ORDER — ZOLPIDEM TARTRATE 10 MG PO TABS: 10.0000 mg | ORAL_TABLET | Freq: Every evening | ORAL | 2 refills | Status: DC | PRN

## 2022-03-03 NOTE — Progress Notes (Unsigned)
Requesting: ambien 10mg  Contract: n/a UDS: 12/10/2020 Last Visit: 12/24/21 Next Visit: 07/17/2022 Last Refill: 06/18/2021  Please Advise

## 2022-03-06 ENCOUNTER — Other Ambulatory Visit: Payer: Self-pay

## 2022-03-06 MED ORDER — ONDANSETRON HCL 4 MG PO TABS: ORAL_TABLET | ORAL | 1 refills | Status: DC

## 2022-03-07 ENCOUNTER — Other Ambulatory Visit: Payer: Self-pay | Admitting: Family Medicine

## 2022-03-07 DIAGNOSIS — M5441 Lumbago with sciatica, right side: Secondary | ICD-10-CM

## 2022-03-07 DIAGNOSIS — M5416 Radiculopathy, lumbar region: Secondary | ICD-10-CM

## 2022-03-07 MED ORDER — HYDROCODONE-ACETAMINOPHEN 10-325 MG PO TABS: 1.0000 | ORAL_TABLET | Freq: Three times a day (TID) | ORAL | 0 refills | Status: DC | PRN

## 2022-03-07 NOTE — Telephone Encounter (Signed)
Medication:   HYDROcodone-acetaminophen (NORCO) 10-325 MG tablet [[id number]]   Has the patient contacted their pharmacy? No. (If no, request that the patient contact the pharmacy for the refill.) (If yes, when and what did the pharmacy advise?)  Preferred Pharmacy (with phone number or street name):   [id number] - [place], [place] - [address] AT [facility] [address] [place] [address]  Phone:  [phone]  Fax:  [phone]   Agent: Please be advised that RX refills may take up to 3 business days. We ask that you follow-up with your pharmacy.

## 2022-03-07 NOTE — Telephone Encounter (Signed)
Requesting: NORCO Contract: n/a UDS:12/10/20 Last Visit:12/24/21 Next Visit:07/17/22 Last Refill:01/30/22  Please Advise

## 2022-03-16 ENCOUNTER — Other Ambulatory Visit: Payer: Self-pay | Admitting: Family Medicine

## 2022-03-17 ENCOUNTER — Other Ambulatory Visit: Payer: Self-pay | Admitting: *Deleted

## 2022-03-17 MED ORDER — BUTALBITAL-ACETAMINOPHEN 50-325 MG PO TABS: 1.0000 | ORAL_TABLET | Freq: Two times a day (BID) | ORAL | 0 refills | Status: DC | PRN

## 2022-03-17 MED ORDER — PREMARIN 0.625 MG/GM VA CREA: TOPICAL_CREAM | VAGINAL | 1 refills | Status: DC

## 2022-03-17 MED ORDER — AMPHETAMINE-DEXTROAMPHET ER 30 MG PO CP24: 60.0000 mg | ORAL_CAPSULE | ORAL | 0 refills | Status: DC

## 2022-03-17 MED ORDER — HYOSCYAMINE SULFATE 0.125 MG SL SUBL: 0.1250 mg | SUBLINGUAL_TABLET | SUBLINGUAL | 0 refills | Status: DC | PRN

## 2022-03-17 NOTE — Telephone Encounter (Signed)
Requesting: Adderall XR 30mg  Acetaminophen/Butalbital 50-325mg  Contract: UDS: 12/10/20 Last Visit:  12/24/21 Next Visit: 07/17/22 Last Refill: Adderall  01/13/22 Acetaminophen/Butalbital 12/24/21  Please Advise

## 2022-03-18 ENCOUNTER — Telehealth: Payer: Self-pay | Admitting: *Deleted

## 2022-03-18 NOTE — Telephone Encounter (Signed)
Prior auth started via cover my meds.  Awaiting determination.  Key: [id number]

## 2022-03-25 NOTE — Telephone Encounter (Signed)
Called to check the status on prior authorization.    Spoke with Fredna Dow with [employer] and she stated that auth went to internal department on 8/10 for review.  This may take up to 8 days.  We can call back around 8/22 for follow up if no fax is received around this time frame.

## 2022-04-03 NOTE — Telephone Encounter (Signed)
PA denied. Preferred alternatives: estradiol patches or Vagifem vaginal tablets  Premarin vaginal cream is considered medically necessary when the individual has failure,  contraindication, or intolerance to one formulary alternative such as estradiol patches or Vagifem  vaginal tablets.

## 2022-04-04 NOTE — Telephone Encounter (Signed)
Called pt was advised and Pt stated she understand but will hold off for now  Pt stated she is ok for now

## 2022-04-07 ENCOUNTER — Other Ambulatory Visit: Payer: Self-pay

## 2022-04-08 ENCOUNTER — Other Ambulatory Visit: Payer: Self-pay | Admitting: Family Medicine

## 2022-04-08 ENCOUNTER — Other Ambulatory Visit: Payer: Self-pay | Admitting: Medical

## 2022-04-08 ENCOUNTER — Other Ambulatory Visit (HOSPITAL_BASED_OUTPATIENT_CLINIC_OR_DEPARTMENT_OTHER): Payer: Self-pay

## 2022-04-08 DIAGNOSIS — M5416 Radiculopathy, lumbar region: Secondary | ICD-10-CM

## 2022-04-08 DIAGNOSIS — M5441 Lumbago with sciatica, right side: Secondary | ICD-10-CM

## 2022-04-08 DIAGNOSIS — E782 Mixed hyperlipidemia: Secondary | ICD-10-CM

## 2022-04-08 DIAGNOSIS — R000 Tachycardia, unspecified: Secondary | ICD-10-CM

## 2022-04-08 DIAGNOSIS — I10 Essential (primary) hypertension: Secondary | ICD-10-CM

## 2022-04-08 MED ORDER — AMPHETAMINE-DEXTROAMPHET ER 30 MG PO CP24
60.0000 mg | ORAL_CAPSULE | ORAL | 0 refills | Status: DC
  Filled 2022-04-08 – 2022-05-05 (×2): qty 60, 30d supply, fill #0

## 2022-04-08 MED ORDER — LOSARTAN POTASSIUM 50 MG PO TABS
50.0000 mg | ORAL_TABLET | Freq: Every day | ORAL | 1 refills | Status: DC
  Filled 2022-04-08: qty 90, 90d supply, fill #0
  Filled 2022-08-07: qty 90, 90d supply, fill #1

## 2022-04-08 MED ORDER — PROMETHAZINE HCL 25 MG PO TABS
25.0000 mg | ORAL_TABLET | Freq: Three times a day (TID) | ORAL | 1 refills | Status: DC | PRN
  Filled 2022-04-08: qty 30, 10d supply, fill #0

## 2022-04-08 MED ORDER — HYDROCODONE-ACETAMINOPHEN 10-325 MG PO TABS
1.0000 | ORAL_TABLET | Freq: Three times a day (TID) | ORAL | 0 refills | Status: DC | PRN
  Filled 2022-04-08: qty 90, 30d supply, fill #0

## 2022-04-08 MED ORDER — FLUOXETINE HCL 40 MG PO CAPS
40.0000 mg | ORAL_CAPSULE | Freq: Every day | ORAL | 1 refills | Status: DC
  Filled 2022-04-08: qty 90, 90d supply, fill #0

## 2022-04-08 NOTE — Telephone Encounter (Signed)
Requesting: hydrocodone 10-325mg  and Adderall XR 30mg   Contract: 09/10/2017 UDS: 12/10/20 Last Visit: 12/24/21 w/ 12/26/21 Next Visit: 07/17/22 Last Refill on hydrocodone: 03/07/22 # 90 and 0RF Last Refill on Adderall: 03/17/22 #60 and 0RF  Please Advise

## 2022-04-16 ENCOUNTER — Other Ambulatory Visit: Payer: Self-pay

## 2022-04-16 ENCOUNTER — Telehealth: Payer: Self-pay

## 2022-04-16 ENCOUNTER — Other Ambulatory Visit: Payer: Self-pay | Admitting: Family Medicine

## 2022-04-16 MED ORDER — FLUOXETINE HCL 40 MG PO CAPS: 40.0000 mg | ORAL_CAPSULE | Freq: Every day | ORAL | 1 refills | Status: DC

## 2022-04-16 MED ORDER — ALPRAZOLAM 1 MG PO TABS
ORAL_TABLET | ORAL | 1 refills | Status: DC
Start: 2022-04-16 — End: 2022-07-11

## 2022-04-16 MED ORDER — OMEPRAZOLE 20 MG PO CPDR: DELAYED_RELEASE_CAPSULE | ORAL | 1 refills | Status: DC

## 2022-04-16 NOTE — Telephone Encounter (Signed)
Done

## 2022-04-22 ENCOUNTER — Encounter (HOSPITAL_COMMUNITY): Payer: Self-pay

## 2022-04-22 ENCOUNTER — Ambulatory Visit (HOSPITAL_COMMUNITY): Payer: Self-pay | Admitting: Psychiatry

## 2022-05-05 ENCOUNTER — Other Ambulatory Visit (HOSPITAL_BASED_OUTPATIENT_CLINIC_OR_DEPARTMENT_OTHER): Payer: Self-pay

## 2022-05-05 ENCOUNTER — Other Ambulatory Visit: Payer: Self-pay | Admitting: Family Medicine

## 2022-05-05 ENCOUNTER — Other Ambulatory Visit: Payer: Self-pay | Admitting: Medical

## 2022-05-05 DIAGNOSIS — M5416 Radiculopathy, lumbar region: Secondary | ICD-10-CM

## 2022-05-05 DIAGNOSIS — M5441 Lumbago with sciatica, right side: Secondary | ICD-10-CM

## 2022-05-05 MED ORDER — BUTALBITAL-ACETAMINOPHEN 50-325 MG PO TABS
1.0000 | ORAL_TABLET | Freq: Two times a day (BID) | ORAL | 0 refills | Status: DC | PRN
  Filled 2022-05-05: qty 30, 15d supply, fill #0

## 2022-05-05 MED ORDER — BACLOFEN 10 MG PO TABS
10.0000 mg | ORAL_TABLET | Freq: Three times a day (TID) | ORAL | 0 refills | Status: DC
Start: 2022-05-05 — End: 2022-05-28
  Filled 2022-05-05: qty 30, 10d supply, fill #0

## 2022-05-05 MED ORDER — HYDROCODONE-ACETAMINOPHEN 10-325 MG PO TABS
1.0000 | ORAL_TABLET | Freq: Three times a day (TID) | ORAL | 0 refills | Status: DC | PRN
  Filled 2022-05-05: qty 90, 30d supply, fill #0

## 2022-05-05 MED ORDER — HYOSCYAMINE SULFATE 0.125 MG SL SUBL
0.1250 mg | SUBLINGUAL_TABLET | SUBLINGUAL | 0 refills | Status: AC | PRN
  Filled 2022-05-05: qty 30, 5d supply, fill #0

## 2022-05-05 NOTE — Telephone Encounter (Signed)
Requesting: hydrocodone 10-325mg   Contract:09/10/2017 UDS:12/10/20 Last Visit:12/24/21 w/ Percell Miller Next Visit: 05/07/22 w/ Nani Ravens Last Refill: 04/08/22 #90 and 0RF  Please Advise

## 2022-05-05 NOTE — Telephone Encounter (Signed)
Requesting: acetaminophen- butalbital 50-325mg  Contract:09/10/2017 UDS:12/10/20 Last Visit:12/24/21 w/ Percell Miller Next Visit: 05/07/22 w/ Nani Ravens Last Refill: 03/17/22 #30 and 0RF   Please Advise

## 2022-05-06 ENCOUNTER — Other Ambulatory Visit (HOSPITAL_BASED_OUTPATIENT_CLINIC_OR_DEPARTMENT_OTHER): Payer: Self-pay

## 2022-05-07 ENCOUNTER — Telehealth (INDEPENDENT_AMBULATORY_CARE_PROVIDER_SITE_OTHER): Payer: Commercial Managed Care - HMO | Admitting: Family Medicine

## 2022-05-07 ENCOUNTER — Encounter: Payer: Self-pay | Admitting: Family Medicine

## 2022-05-07 ENCOUNTER — Other Ambulatory Visit (HOSPITAL_BASED_OUTPATIENT_CLINIC_OR_DEPARTMENT_OTHER): Payer: Self-pay

## 2022-05-07 DIAGNOSIS — J309 Allergic rhinitis, unspecified: Secondary | ICD-10-CM | POA: Diagnosis not present

## 2022-05-07 MED ORDER — PREDNISONE 20 MG PO TABS: 20.0000 mg | ORAL_TABLET | Freq: Every day | ORAL | 0 refills | Status: AC

## 2022-05-07 MED ORDER — FLUCONAZOLE 150 MG PO TABS: ORAL_TABLET | ORAL | 0 refills | Status: DC

## 2022-05-07 NOTE — Progress Notes (Signed)
Chief Complaint  Patient presents with   Sinusitis    Nicole Ball here for URI complaints. Due to COVID-19 pandemic, we are interacting via web portal for an electronic face-to-face visit. I verified patient's ID using 2 identifiers. Patient agreed to proceed with visit via this method. Patient is at home, I am at office. Patient and I are present for visit.   Duration: several years, worse over past few weeks Associated symptoms: sinus congestion, sinus pain, rhinorrhea, and sore throat Denies: ear pain, ear drainage, wheezing, shortness of breath, myalgia, and fevers Treatment to date: Mucinex, routine allergy medications Sick contacts: No  Past Medical History:  Diagnosis Date   Abnormal serum level of alkaline phosphatase 05/21/2014   Acute low back pain 12/19/2010   ALLERGIC RHINITIS 10/16/2010   Anxiety and depression 06/07/2011   Atrophic vaginitis 08/17/2016   Cervical cancer screening 01/10/2014   Menarche at 8 Regular and moderate flow, became irregular until OCPs  history of abnormal pap in past, repeat was normal Last pap roughly 3 years ago G0P0, s/p No history of abnormal MGM, no h/o MGM  No concerns today no gyn surgeries LMP May 2013   CTS (carpal tunnel syndrome) 01/15/2014   Diabetes mellitus type 2 in obese ([place]) 10/30/2011   Dry eyes 08/17/2016   Early menopause    Essential hypertension    HIATAL HERNIA WITH REFLUX, HX OF 10/16/2010   Hyperlipidemia 04/21/2011   Inappropriate sinus tachycardia    INSOMNIA 10/16/2010   Knee pain, left 03/11/2012   Migraine 03/07/2013   Morbid obesity ([place])    Palpitations 01/28/2015   Reflux 04/22/2012   Sleep apnea 06/07/2011   Tobacco use disorder 02/10/2017   Urinary urgency 08/17/2016   UTI'S, HX OF 10/16/2010    Objective No conversational dyspnea Age appropriate judgment and insight Nml affect and mood  Allergic rhinitis, unspecified seasonality, unspecified trigger - Plan: predniSONE (DELTASONE) 20 MG tablet, Ambulatory referral to  Allergy  Exacerbation of chronic issue. 5 d pred burst 40 mg/d. Continue to push fluids, practice good hand hygiene, cover mouth when coughing. Refer to allergy team as had been discussed w PCP. Not convinced this is a bacterial infection.  F/u prn. If starting to experience fevers, shaking, or shortness of breath, seek immediate care. Pt voiced understanding and agreement to the plan.  [place], DO 05/07/22 3:20 PM

## 2022-05-22 ENCOUNTER — Other Ambulatory Visit (HOSPITAL_BASED_OUTPATIENT_CLINIC_OR_DEPARTMENT_OTHER): Payer: Self-pay

## 2022-05-22 ENCOUNTER — Other Ambulatory Visit: Payer: Self-pay | Admitting: Family Medicine

## 2022-05-22 MED ORDER — VENLAFAXINE HCL ER 75 MG PO CP24
75.0000 mg | ORAL_CAPSULE | Freq: Every day | ORAL | 0 refills | Status: DC
  Filled 2022-05-22: qty 90, 90d supply, fill #0

## 2022-05-23 ENCOUNTER — Other Ambulatory Visit (HOSPITAL_BASED_OUTPATIENT_CLINIC_OR_DEPARTMENT_OTHER): Payer: Self-pay

## 2022-05-26 ENCOUNTER — Other Ambulatory Visit (HOSPITAL_BASED_OUTPATIENT_CLINIC_OR_DEPARTMENT_OTHER): Payer: Self-pay

## 2022-05-28 ENCOUNTER — Other Ambulatory Visit: Payer: Self-pay | Admitting: Family Medicine

## 2022-05-28 DIAGNOSIS — I4711 Inappropriate sinus tachycardia, so stated: Secondary | ICD-10-CM

## 2022-05-28 DIAGNOSIS — I10 Essential (primary) hypertension: Secondary | ICD-10-CM

## 2022-05-28 DIAGNOSIS — E782 Mixed hyperlipidemia: Secondary | ICD-10-CM

## 2022-05-29 ENCOUNTER — Other Ambulatory Visit: Payer: Self-pay | Admitting: Family Medicine

## 2022-05-29 ENCOUNTER — Other Ambulatory Visit: Payer: Self-pay

## 2022-05-29 ENCOUNTER — Telehealth: Payer: Self-pay | Admitting: Family Medicine

## 2022-05-29 MED ORDER — FLUOXETINE HCL 40 MG PO CAPS: 40.0000 mg | ORAL_CAPSULE | Freq: Every day | ORAL | 1 refills | Status: DC

## 2022-05-29 NOTE — Telephone Encounter (Signed)
Refill on:  FLUoxetine (PROZAC) 40 MG capsule [[id number]]   Rx #: [id number]  venlafaxine XR (EFFEXOR-XR) 75 MG 24 hr capsule [[id number]]   busPIRone (BUSPAR) 15 MG tablet [[id number]]  ENDED   Pharm: [facility]

## 2022-06-03 ENCOUNTER — Telehealth: Payer: Self-pay | Admitting: Family Medicine

## 2022-06-03 ENCOUNTER — Other Ambulatory Visit: Payer: Self-pay | Admitting: Family Medicine

## 2022-06-03 ENCOUNTER — Telehealth (INDEPENDENT_AMBULATORY_CARE_PROVIDER_SITE_OTHER): Payer: Commercial Managed Care - HMO | Admitting: Family Medicine

## 2022-06-03 ENCOUNTER — Encounter: Payer: Self-pay | Admitting: Family Medicine

## 2022-06-03 DIAGNOSIS — M5416 Radiculopathy, lumbar region: Secondary | ICD-10-CM

## 2022-06-03 DIAGNOSIS — L089 Local infection of the skin and subcutaneous tissue, unspecified: Secondary | ICD-10-CM | POA: Diagnosis not present

## 2022-06-03 DIAGNOSIS — M5441 Lumbago with sciatica, right side: Secondary | ICD-10-CM

## 2022-06-03 DIAGNOSIS — J0100 Acute maxillary sinusitis, unspecified: Secondary | ICD-10-CM

## 2022-06-03 MED ORDER — DOXYCYCLINE HYCLATE 100 MG PO TABS: 100.0000 mg | ORAL_TABLET | Freq: Two times a day (BID) | ORAL | 0 refills | Status: AC

## 2022-06-03 MED ORDER — HYDROCODONE-ACETAMINOPHEN 10-325 MG PO TABS: 1.0000 | ORAL_TABLET | Freq: Three times a day (TID) | ORAL | 0 refills | Status: DC | PRN

## 2022-06-03 MED ORDER — FLUCONAZOLE 150 MG PO TABS: ORAL_TABLET | ORAL | 0 refills | Status: DC

## 2022-06-03 NOTE — Telephone Encounter (Signed)
Medication: HYDROcodone-acetaminophen (NORCO) 10-325 MG tablet [[id number]]    Preferred Pharmacy (with phone number or street name): [facility] [place], [place], [place] [address] Phone: [phone]  Fax: [phone]  Agent: Please be advised that RX refills may take up to 3 business days. We ask that you follow-up with your pharmacy.

## 2022-06-03 NOTE — Progress Notes (Signed)
Chief Complaint  Patient presents with   Sinus Problem    Nicole Ball here for URI complaints. Due to COVID-19 pandemic, we are interacting via web portal for an electronic face-to-face visit. I verified patient's ID using 2 identifiers. Patient agreed to proceed with visit via this method. Patient is at home, I am at office. Patient and I are present for visit.   Duration: 2 days  Associated symptoms: sinus congestion, sinus pain, dental pain, and rhinorrhea Denies: itchy watery eyes, ear pain, ear drainage, sore throat, wheezing, shortness of breath, and fevers, coughing Treatment to date: Tylenol, ibuprofen, Mucinex Sick contacts: No  Past Medical History:  Diagnosis Date   Abnormal serum level of alkaline phosphatase 05/21/2014   Acute low back pain 12/19/2010   ALLERGIC RHINITIS 10/16/2010   Anxiety and depression 06/07/2011   Atrophic vaginitis 08/17/2016   Cervical cancer screening 01/10/2014   Menarche at 8 Regular and moderate flow, became irregular until OCPs  history of abnormal pap in past, repeat was normal Last pap roughly 3 years ago G0P0, s/p No history of abnormal MGM, no h/o MGM  No concerns today no gyn surgeries LMP May 2013   CTS (carpal tunnel syndrome) 01/15/2014   Diabetes mellitus type 2 in obese ([place]) 10/30/2011   Dry eyes 08/17/2016   Early menopause    Essential hypertension    HIATAL HERNIA WITH REFLUX, HX OF 10/16/2010   Hyperlipidemia 04/21/2011   Inappropriate sinus tachycardia    INSOMNIA 10/16/2010   Knee pain, left 03/11/2012   Migraine 03/07/2013   Morbid obesity ([place])    Palpitations 01/28/2015   Reflux 04/22/2012   Sleep apnea 06/07/2011   Tobacco use disorder 02/10/2017   Urinary urgency 08/17/2016   UTI'S, HX OF 10/16/2010    Objective No conversational dyspnea Age appropriate judgment and insight Nml affect and mood  Acute maxillary sinusitis, recurrence not specified  Skin infection - Plan: doxycycline (VIBRA-TABS) 100 MG tablet  Continue INCS  and add back Astelin, push fluids, practice good hand hygiene, cover mouth when coughing. F/u prn. If starting to experience fevers, shaking, or shortness of breath, seek immediate care. Pt voiced understanding and agreement to the plan.  [place], DO 06/03/22 2:14 PM

## 2022-06-04 ENCOUNTER — Ambulatory Visit (HOSPITAL_COMMUNITY): Payer: Self-pay | Admitting: Psychiatry

## 2022-06-06 ENCOUNTER — Other Ambulatory Visit: Payer: Self-pay | Admitting: *Deleted

## 2022-06-06 DIAGNOSIS — I4711 Inappropriate sinus tachycardia, so stated: Secondary | ICD-10-CM

## 2022-06-06 DIAGNOSIS — E782 Mixed hyperlipidemia: Secondary | ICD-10-CM

## 2022-06-06 DIAGNOSIS — I10 Essential (primary) hypertension: Secondary | ICD-10-CM

## 2022-06-10 ENCOUNTER — Telehealth: Payer: Self-pay | Admitting: *Deleted

## 2022-06-10 NOTE — Telephone Encounter (Signed)
Prior auth started via cover my meds.  Awaiting determination.  Key: [id number]

## 2022-06-12 NOTE — Telephone Encounter (Signed)
Prior auth denied medication not cover.  Patient can get over the counter.

## 2022-06-13 ENCOUNTER — Other Ambulatory Visit: Payer: Self-pay

## 2022-06-13 MED ORDER — MONTELUKAST SODIUM 10 MG PO TABS: ORAL_TABLET | ORAL | 1 refills | Status: DC

## 2022-06-24 NOTE — Progress Notes (Deleted)
NEW PATIENT Date of Service/Encounter:  06/24/22 Referring provider: Sharlene Dory* Primary care provider: [name], MD  Subjective:  Nicole Ball is a 46 y.o. female with a PMHx of hyperlipidemia, OSA, anxiety, diabetes type 2, migraines, carpal tunnel, hypertension, obesity, lumbar radiculopathy presenting today for evaluation of *** History obtained from: chart review and {Persons; PED relatives w/patient:[id number]::"patient"}.   Chronic rhinitis: started *** Symptoms include: {Blank multiple:19196:a:"***","nasal congestion","rhinorrhea","post nasal drainage","sneezing","watery eyes","itchy eyes","itchy nose"}  Occurs {Blank single:19197::"year-round","seasonally-***","year-round with seasonal flares","***"} Potential triggers: *** Treatments tried: Over-the-counter antihistamines, Mucinex, azelastine nasal spray, Flonase, Xyzal, Singulair Previous allergy testing: {Blank single:19197::"yes","no"} History of reflux/heartburn: {Blank single:19197::"yes","no"} prescribed Prilosec 20 mg daily Previous sinus, ear, tonsil, adenoid surgeries: *** Previous sinus imaging: 09/19/2016-negative CT paranasal sinuses without contrast  Chart review: PCP visit 05/07/2022-treated with prednisone and referred to allergy for allergic rhinitis. 2021 full echocardiogram-left EF 55 to 60%.  LV normal function.  RV normal function.  Tricuspid aortic valve.  Similar to 2016 study.  She is prescribed Flovent 220, 1 puff twice a day as well as albuterol as needed. 2021 chest x-ray-subtle bibasilar heterogenous airspace opacity, in keeping with reported COVID infection. 06/18/2021 AEC 100   Other allergy screening: Asthma: {Blank single:19197::"yes","no"} Rhino conjunctivitis: {Blank single:19197::"yes","no"} Food allergy: {Blank single:19197::"yes","no"} Medication allergy: {Blank single:19197::"yes","no"} Hymenoptera allergy: {Blank single:19197::"yes","no"} Urticaria: {Blank  single:19197::"yes","no"} Eczema:{Blank single:19197::"yes","no"} History of recurrent infections suggestive of immunodeficency: {Blank single:19197::"yes","no"} ***Vaccinations are up to date.   Past Medical History: Past Medical History:  Diagnosis Date   Abnormal serum level of alkaline phosphatase 05/21/2014   Acute low back pain 12/19/2010   ALLERGIC RHINITIS 10/16/2010   Anxiety and depression 06/07/2011   Atrophic vaginitis 08/17/2016   Cervical cancer screening 01/10/2014   Menarche at 8 Regular and moderate flow, became irregular until OCPs  history of abnormal pap in past, repeat was normal Last pap roughly 3 years ago G0P0, s/p No history of abnormal MGM, no h/o MGM  No concerns today no gyn surgeries LMP May 2013   CTS (carpal tunnel syndrome) 01/15/2014   Diabetes mellitus type 2 in obese ([facility]) 10/30/2011   Dry eyes 08/17/2016   Early menopause    Essential hypertension    HIATAL HERNIA WITH REFLUX, HX OF 10/16/2010   Hyperlipidemia 04/21/2011   Inappropriate sinus tachycardia    INSOMNIA 10/16/2010   Knee pain, left 03/11/2012   Migraine 03/07/2013   Morbid obesity ([facility])    Palpitations 01/28/2015   Reflux 04/22/2012   Sleep apnea 06/07/2011   Tobacco use disorder 02/10/2017   Urinary urgency 08/17/2016   UTI'S, HX OF 10/16/2010   Medication List:  Current Outpatient Medications  Medication Sig Dispense Refill   ACETAMINOPHEN-BUTALBITAL 50-325 MG TABS Take 1 tablet by mouth 2 (two) times daily as needed. 30 tablet 1   albuterol (VENTOLIN HFA) 108 (90 Base) MCG/ACT inhaler INHALE 2 PUFFS INTO THE LUNGS EVERY 6 HOURS AS NEEDED FOR WHEEZING OR SHORTNESS OF BREATH 8.5 g 1   ALPRAZolam (XANAX) 1 MG tablet TAKE 1 TABLET(1 MG) BY MOUTH TWICE DAILY AS NEEDED FOR ANXIETY 30 tablet 1   amphetamine-dextroamphetamine (ADDERALL XR) 30 MG 24 hr capsule Take 2 capsules (60 mg total) by mouth every morning. 60 capsule 0   azelastine (ASTELIN) 0.1 % nasal spray USE 2 SPRAYS INTO BOTH NOSTRILS TWICE DAILY  AS DIRECTED 30 mL 12   baclofen (LIORESAL) 10 MG tablet TAKE 1 TABLET(10 MG) BY MOUTH THREE TIMES DAILY 30 tablet 0   BLACK COHOSH  PO Take 1 tablet by mouth daily.     celecoxib (CELEBREX) 200 MG capsule One to 2 tablets by mouth daily as needed for pain. 60 capsule 2   conjugated estrogens (PREMARIN) vaginal cream Apply small amount to affected area twice a week 42.5 g 1   Dulaglutide (TRULICITY) 0.75 MG/0.5ML SOPN Inject 0.75mg  under the skin weekly 2 mL 0   fluconazole (DIFLUCAN) 150 MG tablet Take 1 tab, repeat in 72 hours if no improvement. 2 tablet 0   FLUoxetine (PROZAC) 40 MG capsule Take 1 capsule (40 mg total) by mouth daily. 90 capsule 1   fluticasone (FLONASE) 50 MCG/ACT nasal spray SHAKE LIQUID AND USE 2 SPRAYS IN EACH NOSTRIL DAILY 16 g 3   fluticasone (FLOVENT HFA) 220 MCG/ACT inhaler Inhale 1 puff into the lungs in the morning and at bedtime. 1 each 1   gabapentin (NEURONTIN) 600 MG tablet TAKE 1 TABLET(600 MG) BY MOUTH THREE TIMES DAILY 90 tablet 1   Ginger, Zingiber officinalis, (GINGER PO) Take 1 tablet by mouth daily.     glucose blood (ONETOUCH VERIO) test strip 1 each by Other route daily in the afternoon. Use as instructed 100 each 6   HYDROcodone-acetaminophen (NORCO) 10-325 MG tablet Take 1 tablet by mouth every 8 (eight) hours as needed. 90 tablet 0   HYDROCORTISONE, TOPICAL, 2 % LOTN Apply 1 Dose topically daily as needed. 59.2 mL 1   hyoscyamine (LEVSIN SL) 0.125 MG SL tablet Take 1 tablet (0.125 mg total) by mouth every 4 (four) hours as needed. 30 tablet 0   levocetirizine (XYZAL) 5 MG tablet TAKE 1 TABLET(5 MG) BY MOUTH IN THE MORNING AND AT BEDTIME 60 tablet 5   losartan (COZAAR) 50 MG tablet Take 1 tablet (50 mg total) by mouth daily. 90 tablet 1   metFORMIN (GLUCOPHAGE) 500 MG tablet Take 2 tablets (1,000 mg total) by mouth 2 (two) times daily. 360 tablet 3   montelukast (SINGULAIR) 10 MG tablet TAKE 1 TABLET(10 MG) BY MOUTH AT BEDTIME 90 tablet 1   mupirocin  ointment (BACTROBAN) 2 % APPLY TO NOSTRILS WITH A CLEAN Q-TIP EVERY NIGHT AT BEDTIME 22 g 1   omeprazole (PRILOSEC) 20 MG capsule TAKE 1 CAPSULE(20 MG) BY MOUTH TWICE DAILY BEFORE A MEAL 180 capsule 1   ondansetron (ZOFRAN) 4 MG tablet TAKE 1 TABLET(4 MG) BY MOUTH EVERY 8 HOURS AS NEEDED FOR NAUSEA OR VOMITING 40 tablet 1   Probiotic Product (PROBIOTIC PO) Take 1 tablet by mouth daily.     promethazine (PHENERGAN) 25 MG tablet TAKE 1 TABLET(25 MG) BY MOUTH EVERY 8 HOURS AS NEEDED FOR NAUSEA OR VOMITING 30 tablet 1   propranolol (INDERAL) 60 MG tablet TAKE 1 TABLET(60 MG) BY MOUTH TWICE DAILY. 180 tablet 1   rosuvastatin (CRESTOR) 5 MG tablet TAKE 1 TABLET(5 MG) BY MOUTH 3 TIMES A WEEK 36 tablet 3   sodium chloride (OCEAN) 0.65 % SOLN nasal spray Place 1 spray into both nostrils daily.     SUMAtriptan (IMITREX) 50 MG tablet TAKE 1 TABLET BY MOUTH EVERY 2 HOURS AS NEEDED FOR MIGRAINE. MAY REPEAT IN 2 HOURS IF HEADACHE PERSISTS OR RECURS 10 tablet 3   venlafaxine XR (EFFEXOR-XR) 75 MG 24 hr capsule Take 1 capsule (75 mg total) by mouth daily with breakfast. 90 capsule 0   zolpidem (AMBIEN) 10 MG tablet Take 1 tablet (10 mg total) by mouth at bedtime as needed for sleep. 30 tablet 2   No current facility-administered medications  for this visit.   Known Allergies:  No Known Allergies Past Surgical History: Past Surgical History:  Procedure Laterality Date   CHOLECYSTECTOMY     Family History: Family History  Problem Relation Age of Onset   Lupus Mother    Lung disease Mother    Cancer Father        cigarette, alcohol HEENT squamous cell   Alcohol abuse Father    Thyroid disease Sister    Asthma Sister    Allergies Brother    Alcohol abuse Maternal Aunt    Alcohol abuse Maternal Uncle    Arthritis Maternal Grandmother    Scleroderma Maternal Grandmother    Glaucoma Maternal Grandmother    Cataracts Maternal Grandmother    Cancer Maternal Grandfather        prostate   Coronary artery  disease Maternal Grandfather        s/p angioplasty   Diabetes Maternal Grandfather        Type 2   Cancer Paternal Grandmother        Breast ca- dx in 50's   Thyroid disease Paternal Grandmother    Breast cancer Paternal Grandmother    Diabetes Paternal Grandfather    Arthritis Other    Social History: [place] lives ***.   ROS:  All other systems negative except as noted per HPI.  Objective:  Last menstrual period 12/10/2012. There is no height or weight on file to calculate BMI. Physical Exam:  General Appearance:  Alert, cooperative, no distress, appears stated age  Head:  Normocephalic, without obvious abnormality, atraumatic  Eyes:  Conjunctiva clear, EOM's intact  Nose: Nares normal, {Blank multiple:19196:a:"***","hypertrophic turbinates","normal mucosa","no visible anterior polyps","septum midline"}  Throat: Lips, tongue normal; teeth and gums normal, {Blank multiple:19196:a:"***","normal posterior oropharynx","tonsils 2+","tonsils 3+","no tonsillar exudate","+ cobblestoning"}  Neck: Supple, symmetrical  Lungs:   {Blank multiple:19196:a:"***","clear to auscultation bilaterally","end-expiratory wheezing","wheezing throughout"}, Respirations unlabored, {Blank multiple:19196:a:"***","no coughing","intermittent dry coughing"}  Heart:  {Blank multiple:19196:a:"***","regular rate and rhythm","no murmur"}, Appears well perfused  Extremities: No edema  Skin: Skin color, texture, turgor normal, no rashes or lesions on visualized portions of skin  Neurologic: No gross deficits     Diagnostics: Spirometry:  Tracings reviewed. Her effort: {Blank single:19197::"Good reproducible efforts.","It was hard to get consistent efforts and there is a question as to whether this reflects a maximal maneuver.","Poor effort, data can not be interpreted.","Variable effort-results affected.","decent for first attempt at spirometry."} FVC: ***L (pre), ***L  (post) FEV1: ***L, ***% predicted (pre),  ***L, ***% predicted (post) FEV1/FVC ratio: ***% (pre), ***% (post) Interpretation: {Blank single:19197::"Spirometry consistent with mild obstructive disease","Spirometry consistent with moderate obstructive disease","Spirometry consistent with severe obstructive disease","Spirometry consistent with possible restrictive disease","Spirometry consistent with mixed obstructive and restrictive disease","Spirometry uninterpretable due to technique","Spirometry consistent with normal pattern","No overt abnormalities noted given today's efforts"} with *** bronchodilator response  Skin Testing: {Blank single:19197::"Select foods","Environmental allergy panel","Environmental allergy panel and select foods","Food allergy panel","None","Deferred due to recent antihistamines use"}. *** Adequate controls. Results discussed with patient/family.   {Blank single:19197::"Allergy testing results were read and interpreted by myself, documented by clinical staff."," "}  Assessment and Plan  ***  {Blank single:19197::"This note in its entirety was forwarded to the Provider who requested this consultation."}  Thank you for your kind referral. I appreciate the opportunity to take part in Evalee's care. Please do not hesitate to contact me with questions.***  Sincerely,  Tonny Bollman, MD [facility]

## 2022-06-25 ENCOUNTER — Ambulatory Visit: Payer: Self-pay | Admitting: Internal Medicine

## 2022-07-03 ENCOUNTER — Other Ambulatory Visit: Payer: Self-pay | Admitting: Family Medicine

## 2022-07-04 ENCOUNTER — Other Ambulatory Visit: Payer: Self-pay | Admitting: Family Medicine

## 2022-07-04 DIAGNOSIS — M5416 Radiculopathy, lumbar region: Secondary | ICD-10-CM

## 2022-07-04 DIAGNOSIS — M5441 Lumbago with sciatica, right side: Secondary | ICD-10-CM

## 2022-07-07 MED ORDER — HYDROCODONE-ACETAMINOPHEN 10-325 MG PO TABS: 1.0000 | ORAL_TABLET | Freq: Three times a day (TID) | ORAL | 0 refills | Status: DC | PRN

## 2022-07-07 NOTE — Telephone Encounter (Signed)
Requesting: Norco Contract: N/A UDS: 12/10/2020 Last Visit: 12/24/2021 Next Visit: 07/17/2022 Last Refill: 06/03/2022  Please Advise

## 2022-07-11 ENCOUNTER — Other Ambulatory Visit: Payer: Self-pay | Admitting: Family Medicine

## 2022-07-11 ENCOUNTER — Encounter: Payer: Self-pay | Admitting: Family Medicine

## 2022-07-11 MED ORDER — ALPRAZOLAM 1 MG PO TABS: ORAL_TABLET | ORAL | 1 refills | Status: DC

## 2022-07-11 NOTE — Telephone Encounter (Signed)
Requesting: xanax Contract: n/a UDS: 12/30/20 Last Visit:06/03/21 Next Visit:07/17/22 Last Refill:04/16/22  Please Advise

## 2022-07-15 NOTE — Progress Notes (Shared)
Subjective:   By signing my name below, I, Nicole Ball, attest that this documentation has been prepared under the direction and in the presence of Nicole Canary, MD., 07/17/2022.   Patient ID: Nicole Ball, female    DOB: Jan 13, 1976, 46 y.o.   MRN: [id number]  No chief complaint on file.  HPI Patient is in today for an office visit. Denies CP/ palp/ SOB/ HA/ congestion/fevers/GI or GU c/o.  Past Medical History:  Diagnosis Date   Abnormal serum level of alkaline phosphatase 05/21/2014   Acute low back pain 12/19/2010   ALLERGIC RHINITIS 10/16/2010   Anxiety and depression 06/07/2011   Atrophic vaginitis 08/17/2016   Cervical cancer screening 01/10/2014   Menarche at 8 Regular and moderate flow, became irregular until OCPs  history of abnormal pap in past, repeat was normal Last pap roughly 3 years ago G0P0, s/p No history of abnormal MGM, no h/o MGM  No concerns today no gyn surgeries LMP May 2013   CTS (carpal tunnel syndrome) 01/15/2014   Diabetes mellitus type 2 in obese (HCC) 10/30/2011   Dry eyes 08/17/2016   Early menopause    Essential hypertension    HIATAL HERNIA WITH REFLUX, HX OF 10/16/2010   Hyperlipidemia 04/21/2011   Inappropriate sinus tachycardia    INSOMNIA 10/16/2010   Knee pain, left 03/11/2012   Migraine 03/07/2013   Morbid obesity (HCC)    Palpitations 01/28/2015   Reflux 04/22/2012   Sleep apnea 06/07/2011   Tobacco use disorder 02/10/2017   Urinary urgency 08/17/2016   UTI'S, HX OF 10/16/2010   Past Surgical History:  Procedure Laterality Date   CHOLECYSTECTOMY     Family History  Problem Relation Age of Onset   Lupus Mother    Lung disease Mother    Cancer Father        cigarette, alcohol HEENT squamous cell   Alcohol abuse Father    Thyroid disease Sister    Asthma Sister    Allergies Brother    Alcohol abuse Maternal Aunt    Alcohol abuse Maternal Uncle    Arthritis Maternal Grandmother    Scleroderma Maternal Grandmother    Glaucoma Maternal  Grandmother    Cataracts Maternal Grandmother    Cancer Maternal Grandfather        prostate   Coronary artery disease Maternal Grandfather        s/p angioplasty   Diabetes Maternal Grandfather        Type 2   Cancer Paternal Grandmother        Breast ca- dx in 50's   Thyroid disease Paternal Grandmother    Breast cancer Paternal Grandmother    Diabetes Paternal Grandfather    Arthritis Other    Social History   Socioeconomic History   Marital status: Married    Spouse name: Not on file   Number of children: Not on file   Years of education: Not on file   Highest education level: Not on file  Occupational History   Not on file  Tobacco Use   Smoking status: Some Days    Packs/day: 0.10    Types: Cigars, Cigarettes   Smokeless tobacco: Never   Tobacco comments:    1 black and milds  Vaping Use   Vaping Use: Never used  Substance and Sexual Activity   Alcohol use: Yes    Comment: twice/year   Drug use: No   Sexual activity: Not on file  Other Topics Concern   Not  on file  Social History Narrative   Married lives w/ husband, works at [employer]   Social Determinants of Corporate investment banker Strain: Not on [employer] Insecurity: Not on file  Transportation Needs: Not on file  Physical Activity: Not on file  Stress: Not on file  Social Connections: Not on file  Intimate Partner Violence: Not on file   Outpatient Medications Prior to Visit  Medication Sig Dispense Refill   ACETAMINOPHEN-BUTALBITAL 50-325 MG TABS Take 1 tablet by mouth 2 (two) times daily as needed. 30 tablet 1   albuterol (VENTOLIN HFA) 108 (90 Base) MCG/ACT inhaler INHALE 2 PUFFS INTO THE LUNGS EVERY 6 HOURS AS NEEDED FOR WHEEZING OR SHORTNESS OF BREATH 8.5 g 1   ALPRAZolam (XANAX) 1 MG tablet TAKE 1 TABLET(1 MG) BY MOUTH TWICE DAILY AS NEEDED FOR ANXIETY 30 tablet 1   amphetamine-dextroamphetamine (ADDERALL XR) 30 MG 24 hr capsule Take 2 capsules (60 mg total) by mouth every morning.  60 capsule 0   azelastine (ASTELIN) 0.1 % nasal spray USE 2 SPRAYS INTO BOTH NOSTRILS TWICE DAILY AS DIRECTED 30 mL 12   baclofen (LIORESAL) 10 MG tablet TAKE 1 TABLET(10 MG) BY MOUTH THREE TIMES DAILY 30 tablet 0   BLACK COHOSH PO Take 1 tablet by mouth daily.     celecoxib (CELEBREX) 200 MG capsule One to 2 tablets by mouth daily as needed for pain. 60 capsule 2   conjugated estrogens (PREMARIN) vaginal cream Apply small amount to affected area twice a week 42.5 g 1   Dulaglutide (TRULICITY) 0.75 MG/0.5ML SOPN Inject 0.75mg  under the skin weekly 2 mL 0   fluconazole (DIFLUCAN) 150 MG tablet Take 1 tab, repeat in 72 hours if no improvement. 2 tablet 0   FLUoxetine (PROZAC) 40 MG capsule Take 1 capsule (40 mg total) by mouth daily. 90 capsule 1   fluticasone (FLONASE) 50 MCG/ACT nasal spray SHAKE LIQUID AND USE 2 SPRAYS IN EACH NOSTRIL DAILY 16 g 3   fluticasone (FLOVENT HFA) 220 MCG/ACT inhaler Inhale 1 puff into the lungs in the morning and at bedtime. 1 each 1   gabapentin (NEURONTIN) 600 MG tablet TAKE 1 TABLET(600 MG) BY MOUTH THREE TIMES DAILY 90 tablet 1   Ginger, Zingiber officinalis, (GINGER PO) Take 1 tablet by mouth daily.     glucose blood (ONETOUCH VERIO) test strip 1 each by Other route daily in the afternoon. Use as instructed 100 each 6   HYDROcodone-acetaminophen (NORCO) 10-325 MG tablet Take 1 tablet by mouth every 8 (eight) hours as needed. 90 tablet 0   HYDROCORTISONE, TOPICAL, 2 % LOTN Apply 1 Dose topically daily as needed. 59.2 mL 1   hyoscyamine (LEVSIN SL) 0.125 MG SL tablet Take 1 tablet (0.125 mg total) by mouth every 4 (four) hours as needed. 30 tablet 0   levocetirizine (XYZAL) 5 MG tablet TAKE 1 TABLET(5 MG) BY MOUTH IN THE MORNING AND AT BEDTIME 60 tablet 5   losartan (COZAAR) 50 MG tablet Take 1 tablet (50 mg total) by mouth daily. 90 tablet 1   metFORMIN (GLUCOPHAGE) 500 MG tablet TAKE 2 TABLETS(1000 MG) BY MOUTH TWICE DAILY 360 tablet 3   montelukast (SINGULAIR)  10 MG tablet TAKE 1 TABLET(10 MG) BY MOUTH AT BEDTIME 90 tablet 1   mupirocin ointment (BACTROBAN) 2 % APPLY TO NOSTRILS WITH A CLEAN Q-TIP EVERY NIGHT AT BEDTIME 22 g 1   omeprazole (PRILOSEC) 20 MG capsule TAKE 1 CAPSULE(20 MG) BY MOUTH TWICE  DAILY BEFORE A MEAL 180 capsule 1   ondansetron (ZOFRAN) 4 MG tablet TAKE 1 TABLET(4 MG) BY MOUTH EVERY 8 HOURS AS NEEDED FOR NAUSEA OR VOMITING 40 tablet 1   Probiotic Product (PROBIOTIC PO) Take 1 tablet by mouth daily.     promethazine (PHENERGAN) 25 MG tablet TAKE 1 TABLET(25 MG) BY MOUTH EVERY 8 HOURS AS NEEDED FOR NAUSEA OR VOMITING 30 tablet 1   propranolol (INDERAL) 60 MG tablet TAKE 1 TABLET(60 MG) BY MOUTH TWICE DAILY. 180 tablet 1   rosuvastatin (CRESTOR) 5 MG tablet TAKE 1 TABLET(5 MG) BY MOUTH 3 TIMES A WEEK 36 tablet 3   sodium chloride (OCEAN) 0.65 % SOLN nasal spray Place 1 spray into both nostrils daily.     SUMAtriptan (IMITREX) 50 MG tablet TAKE 1 TABLET BY MOUTH EVERY 2 HOURS AS NEEDED FOR MIGRAINE. MAY REPEAT IN 2 HOURS IF HEADACHE PERSISTS OR RECURS 10 tablet 3   venlafaxine XR (EFFEXOR-XR) 75 MG 24 hr capsule Take 1 capsule (75 mg total) by mouth daily with breakfast. 90 capsule 0   zolpidem (AMBIEN) 10 MG tablet Take 1 tablet (10 mg total) by mouth at bedtime as needed for sleep. 30 tablet 2   No facility-administered medications prior to visit.   No Known Allergies  Review of Systems  Constitutional:  Negative for chills and fever.  HENT:  Negative for congestion.   Respiratory:  Negative for shortness of breath.   Cardiovascular:  Negative for chest pain and palpitations.  Gastrointestinal:  Negative for abdominal pain, blood in stool, constipation, diarrhea, nausea and vomiting.  Genitourinary:  Negative for dysuria, frequency, hematuria and urgency.  Skin:           Neurological:  Negative for headaches.      Objective:    Physical Exam Constitutional:      General: She is not in acute distress.    Appearance:  Normal appearance. She is normal weight. She is not ill-appearing.  HENT:     Head: Normocephalic and atraumatic.     Right Ear: External ear normal.     Left Ear: External ear normal.     Nose: Nose normal.     Mouth/Throat:     Mouth: Mucous membranes are moist.     Pharynx: Oropharynx is clear.  Eyes:     General:        Right eye: No discharge.        Left eye: No discharge.     Extraocular Movements: Extraocular movements intact.     Conjunctiva/sclera: Conjunctivae normal.     Pupils: Pupils are equal, round, and reactive to light.  Cardiovascular:     Rate and Rhythm: Normal rate and regular rhythm.     Pulses: Normal pulses.     Heart sounds: Normal heart sounds. No murmur heard.    No gallop.  Pulmonary:     Effort: Pulmonary effort is normal. No respiratory distress.     Breath sounds: Normal breath sounds. No wheezing or rales.  Abdominal:     General: Bowel sounds are normal.     Palpations: Abdomen is soft.     Tenderness: There is no abdominal tenderness. There is no guarding.  Musculoskeletal:        General: Normal range of motion.     Cervical back: Normal range of motion.     Right lower leg: No edema.     Left lower leg: No edema.  Skin:    General:  Skin is warm and dry.  Neurological:     Mental Status: She is alert and oriented to person, place, and time.  Psychiatric:        Mood and Affect: Mood normal.        Behavior: Behavior normal.        Judgment: Judgment normal.    LMP 12/10/2012  Wt Readings from Last 3 Encounters:  12/24/21 180 lb 9.6 oz (81.9 kg)  06/18/21 179 lb 9.6 oz (81.5 kg)  03/28/21 179 lb (81.2 kg)   Diabetic Foot Exam - Simple   No data filed    Lab Results  Component Value Date   WBC 4.7 06/18/2021   HGB 12.6 06/18/2021   HCT 39.3 06/18/2021   PLT 215.0 06/18/2021   GLUCOSE 201 (H) 12/24/2021   CHOL 210 (H) 12/24/2021   TRIG 153.0 (H) 12/24/2021   HDL 74.20 12/24/2021   LDLDIRECT 44.0 06/18/2021   LDLCALC 105  (H) 12/24/2021   ALT 31 12/24/2021   AST 22 12/24/2021   NA 134 (L) 12/24/2021   K 3.7 12/24/2021   CL 100 12/24/2021   CREATININE 0.78 12/24/2021   BUN 12 12/24/2021   CO2 24 12/24/2021   TSH 1.14 06/18/2021   HGBA1C 7.9 (H) 12/24/2021   MICROALBUR <0.7 09/17/2015   Lab Results  Component Value Date   TSH 1.14 06/18/2021   Lab Results  Component Value Date   WBC 4.7 06/18/2021   HGB 12.6 06/18/2021   HCT 39.3 06/18/2021   MCV 90.0 06/18/2021   PLT 215.0 06/18/2021   Lab Results  Component Value Date   NA 134 (L) 12/24/2021   K 3.7 12/24/2021   CO2 24 12/24/2021   GLUCOSE 201 (H) 12/24/2021   BUN 12 12/24/2021   CREATININE 0.78 12/24/2021   BILITOT 0.4 12/24/2021   ALKPHOS 140 (H) 12/24/2021   AST 22 12/24/2021   ALT 31 12/24/2021   PROT 7.8 12/24/2021   ALBUMIN 4.6 12/24/2021   CALCIUM 10.1 12/24/2021   ANIONGAP 10 09/13/2020   GFR 90.35 12/24/2021   Lab Results  Component Value Date   CHOL 210 (H) 12/24/2021   Lab Results  Component Value Date   HDL 74.20 12/24/2021   Lab Results  Component Value Date   LDLCALC 105 (H) 12/24/2021   Lab Results  Component Value Date   TRIG 153.0 (H) 12/24/2021   Lab Results  Component Value Date   CHOLHDL 3 12/24/2021   Lab Results  Component Value Date   HGBA1C 7.9 (H) 12/24/2021      Assessment & Plan:   Problem List Items Addressed This Visit   None  No orders of the defined types were placed in this encounter.  I, Nicole SagesMohammed Iqbal, personally preformed the services described in this documentation.  All medical record entries made by the scribe were at my direction and in my presence.  I have reviewed the chart and discharge instructions (if applicable) and agree that the record reflects my personal performance and is accurate and complete. 07/17/2022  I,Mohammed Iqbal,acting as a scribe for Danise EdgeStacey Blyth, MD.,have documented all relevant documentation on the behalf of Danise EdgeStacey Blyth, MD,as directed by   Danise EdgeStacey Blyth, MD while in the presence of Danise EdgeStacey Blyth, MD.  Nicole SagesMohammed Iqbal

## 2022-07-16 NOTE — Assessment & Plan Note (Deleted)
Well controlled, no changes to meds. Encouraged heart healthy diet such as the DASH diet and exercise as tolerated.  °

## 2022-07-16 NOTE — Assessment & Plan Note (Deleted)
Hydrate and monitor 

## 2022-07-16 NOTE — Assessment & Plan Note (Deleted)
hgba1c acceptable, minimize simple carbs. Increase exercise as tolerated. Continue current meds. But drop the Metformin from 4 tabs to 3 tabs daily. Started on Mounjaro 2.5 mg weekly for one month and then increase to 5 mg weekly. And then reevaluate.

## 2022-07-16 NOTE — Assessment & Plan Note (Deleted)
Encouraged moist heat and gentle stretching as tolerated. May try NSAIDs and prescription meds as directed and report if symptoms worsen or seek immediate care 

## 2022-07-16 NOTE — Assessment & Plan Note (Deleted)
Encouraged DASH or MIND diet, decrease po intake and increase exercise as tolerated. Needs 7-8 hours of sleep nightly. Avoid trans fats, eat small, frequent meals every 4-5 hours with lean proteins, complex carbs and healthy fats. Minimize simple carbs, high fat foods and processed foods. Consider medications

## 2022-07-16 NOTE — Assessment & Plan Note (Deleted)
Encourage heart healthy diet such as MIND or DASH diet, increase exercise, avoid trans fats, simple carbohydrates and processed foods, consider a krill or fish or flaxseed oil cap daily.  °

## 2022-07-16 NOTE — Assessment & Plan Note (Deleted)
Encouraged increased hydration, 64 ounces of clear fluids daily. Minimize alcohol and caffeine. Eat small frequent meals with lean proteins and complex carbs. Avoid high and low blood sugars. Get adequate sleep, 7-8 hours a night. Needs exercise daily preferably in the morning.  

## 2022-07-17 ENCOUNTER — Ambulatory Visit: Payer: Commercial Managed Care - HMO | Admitting: Family Medicine

## 2022-07-17 DIAGNOSIS — E669 Obesity, unspecified: Secondary | ICD-10-CM

## 2022-07-17 DIAGNOSIS — R748 Abnormal levels of other serum enzymes: Secondary | ICD-10-CM

## 2022-07-17 DIAGNOSIS — I10 Essential (primary) hypertension: Secondary | ICD-10-CM

## 2022-07-17 DIAGNOSIS — E782 Mixed hyperlipidemia: Secondary | ICD-10-CM

## 2022-07-17 DIAGNOSIS — E1169 Type 2 diabetes mellitus with other specified complication: Secondary | ICD-10-CM

## 2022-07-17 DIAGNOSIS — M5441 Lumbago with sciatica, right side: Secondary | ICD-10-CM

## 2022-07-17 DIAGNOSIS — R519 Headache, unspecified: Secondary | ICD-10-CM

## 2022-07-28 ENCOUNTER — Other Ambulatory Visit: Payer: Self-pay | Admitting: Family Medicine

## 2022-07-31 IMAGING — XA Imaging study
2 series · 2 of 2 positions shown · non-contrast
Comparison: none

CLINICAL DATA: Lumbosacral spondylosis without myelopathy. Low back
and right leg pain. 85% relief after prior L4-L5 epidural
injections, but only for several weeks. Repeat injection requested.

[Series 1: ortho adipose · 1 of 1 slices shown (1 of 2)]
[im 1/1]
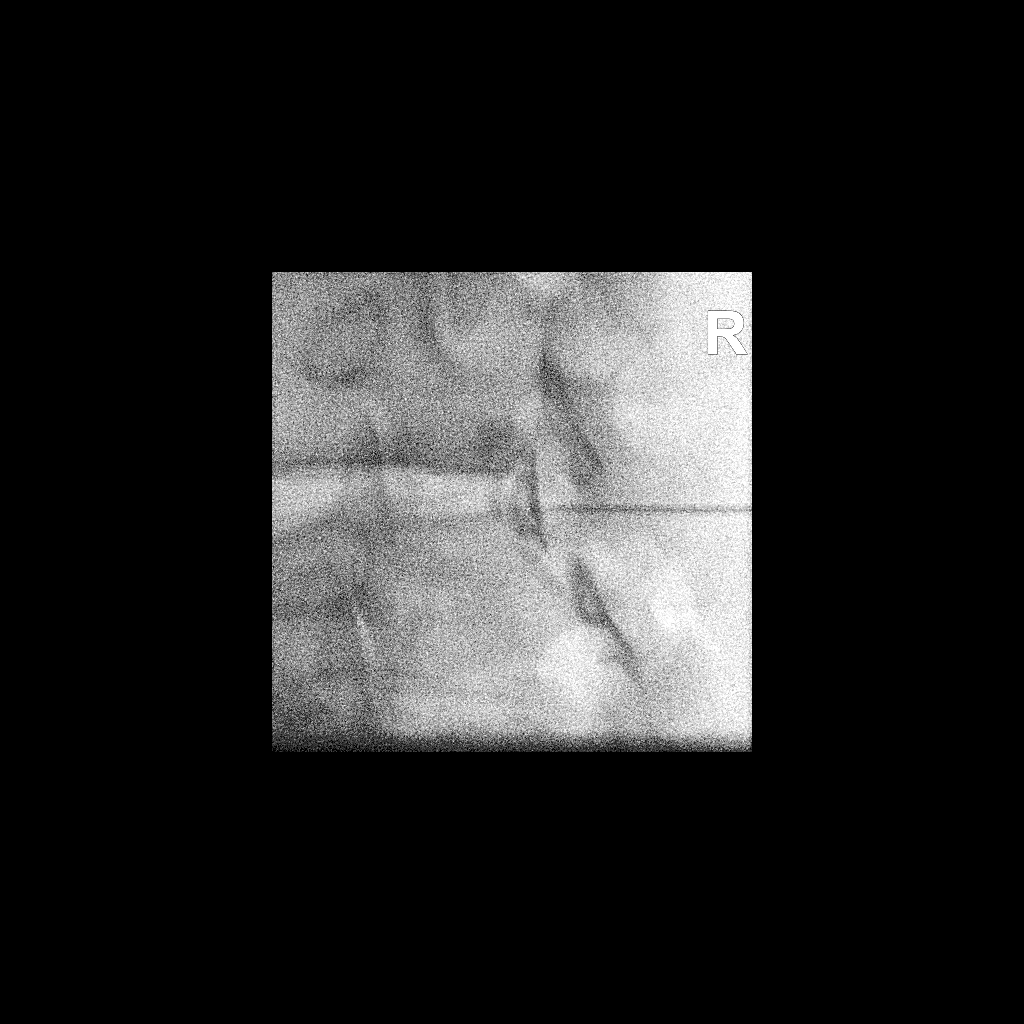

[Series 2: ortho adipose · 1 of 1 slices shown (2 of 2)]
[im 1/1]
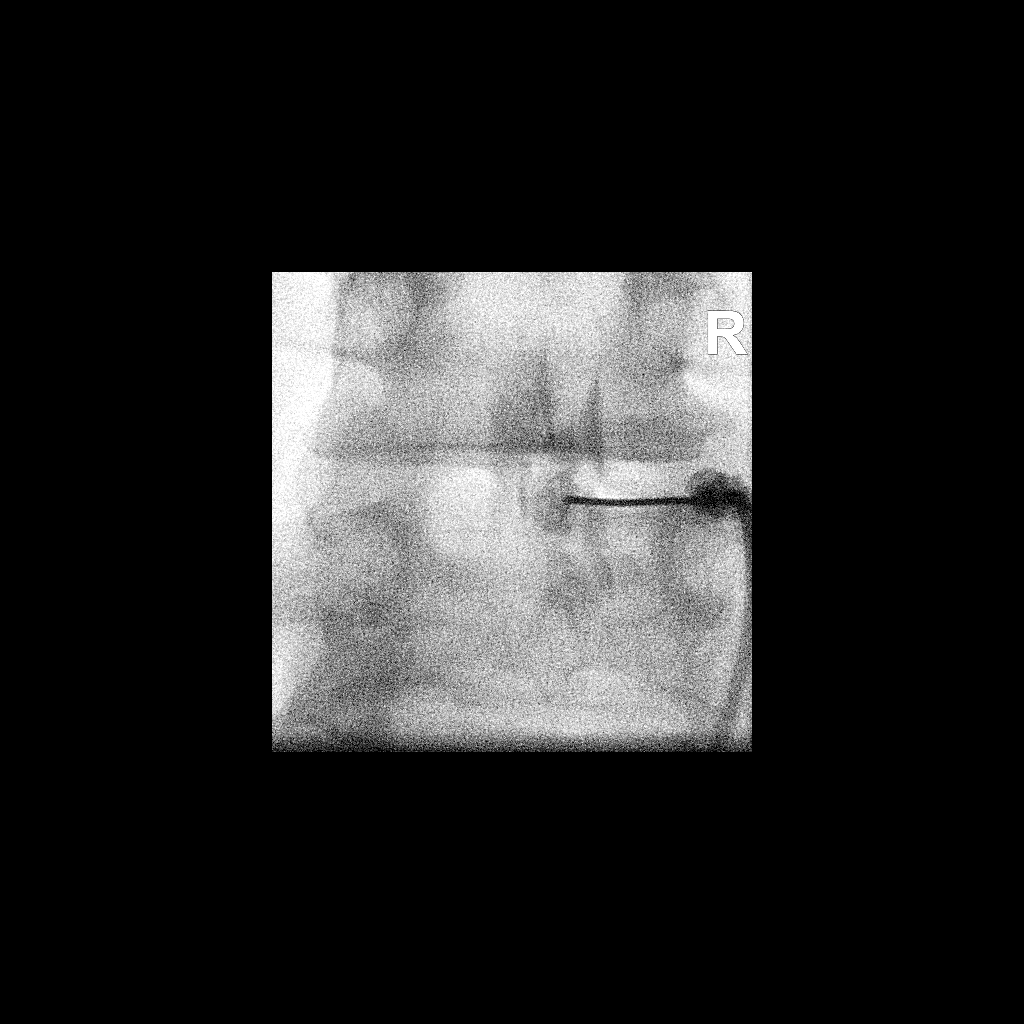

[2 of 2 positions shown; findings below may reference images not displayed]

FLUOROSCOPY TIME:  Radiation Exposure Index (as provided by the
fluoroscopic device): 2.5 mGy

Fluoroscopy Time:  16 seconds

Number of Acquired Images:  0

PROCEDURE:
The procedure, risks, benefits, and alternatives were explained to
the patient. Questions regarding the procedure were encouraged and
answered. The patient understands and consents to the procedure.

LUMBAR EPIDURAL INJECTION:

An interlaminar approach was performed on the right at L4-L5. The
overlying skin was cleansed and anesthetized. A 3.5 inch 20 gauge
epidural needle was advanced using loss-of-resistance technique.

DIAGNOSTIC EPIDURAL INJECTION:

Injection of Isovue-M 200 shows a good epidural pattern with spread
above and below the level of needle placement, primarily on the
right. No vascular opacification is seen.

THERAPEUTIC EPIDURAL INJECTION:

120 mg of Depo-Medrol mixed with 2 mL of 1% lidocaine were
instilled. The procedure was well-tolerated, and the patient was
discharged thirty minutes following the injection in good condition.

COMPLICATIONS:
None immediate.
IMPRESSION: Technically successful interlaminar epidural injection on the right
at L4-L5.

## 2022-08-01 ENCOUNTER — Other Ambulatory Visit: Payer: Self-pay | Admitting: Family Medicine

## 2022-08-01 ENCOUNTER — Other Ambulatory Visit (HOSPITAL_BASED_OUTPATIENT_CLINIC_OR_DEPARTMENT_OTHER): Payer: Self-pay

## 2022-08-05 ENCOUNTER — Other Ambulatory Visit (HOSPITAL_BASED_OUTPATIENT_CLINIC_OR_DEPARTMENT_OTHER): Payer: Self-pay

## 2022-08-05 MED ORDER — AMPHETAMINE-DEXTROAMPHET ER 30 MG PO CP24
60.0000 mg | ORAL_CAPSULE | ORAL | 0 refills | Status: DC
  Filled 2022-08-05: qty 60, 30d supply, fill #0

## 2022-08-07 ENCOUNTER — Other Ambulatory Visit: Payer: Self-pay | Admitting: Family Medicine

## 2022-08-07 ENCOUNTER — Other Ambulatory Visit (HOSPITAL_BASED_OUTPATIENT_CLINIC_OR_DEPARTMENT_OTHER): Payer: Self-pay

## 2022-08-07 DIAGNOSIS — M5441 Lumbago with sciatica, right side: Secondary | ICD-10-CM

## 2022-08-07 DIAGNOSIS — M5416 Radiculopathy, lumbar region: Secondary | ICD-10-CM

## 2022-08-07 MED ORDER — ESTROGENS CONJUGATED 0.625 MG/GM VA CREA
TOPICAL_CREAM | VAGINAL | 1 refills | Status: AC
  Filled 2022-08-07 (×2): qty 30, 30d supply, fill #0

## 2022-08-07 MED ORDER — ZOLPIDEM TARTRATE 10 MG PO TABS: 10.0000 mg | ORAL_TABLET | Freq: Every evening | ORAL | 2 refills | Status: DC | PRN

## 2022-08-07 MED ORDER — HYDROCODONE-ACETAMINOPHEN 10-325 MG PO TABS
1.0000 | ORAL_TABLET | Freq: Three times a day (TID) | ORAL | 0 refills | Status: DC | PRN
  Filled 2022-08-07 – 2022-08-15 (×2): qty 90, 30d supply, fill #0

## 2022-08-07 MED ORDER — BACLOFEN 10 MG PO TABS
10.0000 mg | ORAL_TABLET | Freq: Three times a day (TID) | ORAL | 1 refills | Status: DC | PRN
  Filled 2022-08-07: qty 30, 10d supply, fill #0

## 2022-08-07 MED ORDER — LEVOCETIRIZINE DIHYDROCHLORIDE 5 MG PO TABS
5.0000 mg | ORAL_TABLET | Freq: Two times a day (BID) | ORAL | 5 refills | Status: DC
  Filled 2022-08-07: qty 60, 30d supply, fill #0

## 2022-08-07 MED ORDER — MONTELUKAST SODIUM 10 MG PO TABS
10.0000 mg | ORAL_TABLET | Freq: Every day | ORAL | 1 refills | Status: DC
  Filled 2022-08-07: qty 90, 90d supply, fill #0

## 2022-08-07 NOTE — Telephone Encounter (Signed)
Requesting: hydrocodone 10-325mg   Contract: 07/17/22 UDS: 12/10/20 Last Visit: 06/03/22 w/ Carmelia Roller Next Visit: None Last Refill: 07/07/22 #90 and 0RF   Please Advise

## 2022-08-07 NOTE — Telephone Encounter (Signed)
Requesting: Ambien  Contract: 07/17/22 UDS: 12/10/20 Last Visit: 06/03/22 w/ Carmelia Roller Next Visit: None Last Refill: 03/03/22 #30 and 2RF   Please Advise

## 2022-08-08 ENCOUNTER — Other Ambulatory Visit (HOSPITAL_BASED_OUTPATIENT_CLINIC_OR_DEPARTMENT_OTHER): Payer: Self-pay

## 2022-08-08 ENCOUNTER — Other Ambulatory Visit: Payer: Self-pay

## 2022-08-12 ENCOUNTER — Other Ambulatory Visit (HOSPITAL_BASED_OUTPATIENT_CLINIC_OR_DEPARTMENT_OTHER): Payer: Self-pay

## 2022-08-12 ENCOUNTER — Other Ambulatory Visit: Payer: Self-pay

## 2022-08-13 ENCOUNTER — Other Ambulatory Visit (HOSPITAL_BASED_OUTPATIENT_CLINIC_OR_DEPARTMENT_OTHER): Payer: Self-pay

## 2022-08-15 ENCOUNTER — Other Ambulatory Visit (HOSPITAL_BASED_OUTPATIENT_CLINIC_OR_DEPARTMENT_OTHER): Payer: Self-pay

## 2022-09-05 ENCOUNTER — Other Ambulatory Visit: Payer: Self-pay | Admitting: Medical

## 2022-09-10 ENCOUNTER — Other Ambulatory Visit: Payer: Self-pay | Admitting: Family Medicine

## 2022-09-15 ENCOUNTER — Other Ambulatory Visit: Payer: Self-pay | Admitting: Family Medicine

## 2022-09-16 ENCOUNTER — Other Ambulatory Visit: Payer: Self-pay

## 2022-09-16 ENCOUNTER — Other Ambulatory Visit (HOSPITAL_BASED_OUTPATIENT_CLINIC_OR_DEPARTMENT_OTHER): Payer: Self-pay

## 2022-09-16 ENCOUNTER — Other Ambulatory Visit: Payer: Self-pay | Admitting: Family Medicine

## 2022-09-16 ENCOUNTER — Telehealth: Payer: Self-pay | Admitting: Family Medicine

## 2022-09-16 MED ORDER — PROMETHAZINE HCL 25 MG PO TABS: ORAL_TABLET | ORAL | 1 refills | Status: DC

## 2022-09-16 MED ORDER — PROPRANOLOL HCL 60 MG PO TABS: 60.0000 mg | ORAL_TABLET | Freq: Two times a day (BID) | ORAL | 0 refills | Status: DC

## 2022-09-16 MED ORDER — SUMATRIPTAN SUCCINATE 50 MG PO TABS: ORAL_TABLET | ORAL | 3 refills | Status: DC

## 2022-09-16 MED ORDER — PROPRANOLOL HCL 60 MG PO TABS
60.0000 mg | ORAL_TABLET | Freq: Two times a day (BID) | ORAL | 0 refills | Status: DC
  Filled 2022-09-16: qty 180, 90d supply, fill #0

## 2022-09-16 NOTE — Telephone Encounter (Signed)
Triage nurse called and stated that pt needed an appointment within 24 hrs. Lovena Le had an opening in the morning and I offered that to her, pt stated that she was out of state and unable to come. Pt asked if we could just send in her medications and I asked Dr. Charlett Blake, she said it was ok for everything except the xanax due to she wouldn't send controlled meds over state lines.  Meds sent

## 2022-09-16 NOTE — Telephone Encounter (Signed)
Pt stated she has been without heart medication and was unsure if she was having a panic attack. She has been throwing up with elevated bp. Transferred to triage. Pt is currently in [place] FYI.    Medication: SUMAtriptan (IMITREX) 50 MG tablet (sent to this pharm instead)  propranolol (INDERAL) 60 MG tablet  ALPRAZolam (XANAX) 1 MG tablet   Has the patient contacted their pharmacy? No.   Preferred Pharmacy:   [facility], [place] [address]

## 2022-09-17 NOTE — Telephone Encounter (Signed)
Initial Comment Caller states she has not had her Propranolol in a week. She is having light headiness and a mild headache and blurry vision. Translation No Nurse Assessment Nurse: Nicki Reaper, RN, Malachy Mood Date/Time (Eastern Time): 09/16/2022 4:15:47 PM Confirm and document reason for call. If symptomatic, describe symptoms. ---Caller states she needs a refill on Propranolol, has been out for a week, unsure of Current BP, has a mild HA ,lightheaded, blurry vision, symptoms started 2 days ago, vomited last night x 1, vision is totally blurry but is not clear, is kind of shaky, anxious, is able to stand and walk without support, some nausea today, a couple days ago her chest was tight but not today, is drinking and voiding, Does the patient have any new or worsening symptoms? ---Yes Will a triage be completed? ---Yes Related visit to physician within the last 2 weeks? ---No Does the PT have any chronic conditions? (i.e. diabetes, asthma, this includes High risk factors for pregnancy, etc.) ---Yes List chronic conditions. ---HTN DM Is the patient pregnant or possibly pregnant? (Ask all females between the ages of 70-55) ---No Is this a behavioral health or substance abuse call? ---No Guidelines Guideline Title Affirmed Question Affirmed Notes Nurse Date/Time (Eastern Time) Headache [1] New headache AND [2] weak immune system (e.g., Nicki Reaper, RN, Malachy Mood 09/16/2022 4:22:44 PM PLEASE NOTE: All timestamps contained within this report are represented as [place] Standard Time. CONFIDENTIALTY NOTICE: This fax transmission is intended only for the addressee. It contains information that is legally privileged, confidential or otherwise protected from use or disclosure. If you are not the intended recipient, you are strictly prohibited from reviewing, disclosing, copying using or disseminating any of this information or taking any action in reliance on or regarding this information. If you have received  this fax in error, please notify us immediately by telephone so that we can arrange for its return to [place]. Phone: [phone], Toll-Free: [phone], Fax: [phone] Page: 2 of 2 Call Id: [id number] Guidelines Guideline Title Affirmed Question Affirmed Notes Nurse Date/Time Eilene Ghazi Time) HIV positive, cancer chemo, splenectomy, organ transplant, chronic steroids) Disp. Time Eilene Ghazi Time) Disposition Final User 09/16/2022 4:31:30 PM See PCP within 24 Hours Yes Nicki Reaper, RN, Malachy Mood Final Disposition 09/16/2022 4:31:30 PM See PCP within 24 Hours Yes Nicki Reaper, RN, Erskine Speed Disagree/Comply Disagree Caller Understands Yes PreDisposition Call Doctor Care Advice Given Per Guideline SEE PCP WITHIN 24 HOURS: * IF OFFICE WILL BE OPEN: You need to be examined within the next 24 hours. Call your doctor (or NP/PA) when the office opens and make an appointment. Comments User: Burna Sis, RN Date/Time Eilene Ghazi Time): 09/16/2022 4:34:17 PM Called office spoke with Lurline Idol, pt refused to be seen as she is out of state, MD unable to send RX will be sent to local pharmacy and family will pick up and mail to patient Referrals [place] REFUSED

## 2022-09-18 ENCOUNTER — Other Ambulatory Visit: Payer: Self-pay | Admitting: Family Medicine

## 2022-09-18 ENCOUNTER — Encounter: Payer: Self-pay | Admitting: Family Medicine

## 2022-09-18 DIAGNOSIS — M5441 Lumbago with sciatica, right side: Secondary | ICD-10-CM

## 2022-09-18 DIAGNOSIS — M5416 Radiculopathy, lumbar region: Secondary | ICD-10-CM

## 2022-09-19 ENCOUNTER — Encounter: Payer: Self-pay | Admitting: Family Medicine

## 2022-09-19 MED ORDER — VENLAFAXINE HCL ER 75 MG PO CP24: 75.0000 mg | ORAL_CAPSULE | Freq: Every day | ORAL | 0 refills | Status: DC

## 2022-09-20 ENCOUNTER — Other Ambulatory Visit: Payer: Self-pay | Admitting: Family Medicine

## 2022-09-20 DIAGNOSIS — M5416 Radiculopathy, lumbar region: Secondary | ICD-10-CM

## 2022-09-20 DIAGNOSIS — M5441 Lumbago with sciatica, right side: Secondary | ICD-10-CM

## 2022-09-20 MED ORDER — HYDROCODONE-ACETAMINOPHEN 10-325 MG PO TABS: 1.0000 | ORAL_TABLET | Freq: Three times a day (TID) | ORAL | 0 refills | Status: DC | PRN

## 2022-09-20 MED ORDER — ALPRAZOLAM 1 MG PO TABS: ORAL_TABLET | ORAL | 1 refills | Status: DC

## 2022-09-22 ENCOUNTER — Telehealth: Payer: Self-pay

## 2022-09-22 NOTE — Telephone Encounter (Signed)
PA initiated via Covermymeds; KEY: [id number]. PA approved.   Effective from 09/22/2022 through 10/21/2022

## 2022-10-03 ENCOUNTER — Other Ambulatory Visit: Payer: Self-pay | Admitting: Family Medicine

## 2022-10-06 ENCOUNTER — Other Ambulatory Visit (HOSPITAL_BASED_OUTPATIENT_CLINIC_OR_DEPARTMENT_OTHER): Payer: Self-pay

## 2022-10-07 ENCOUNTER — Ambulatory Visit: Payer: Self-pay | Admitting: Family

## 2022-10-11 ENCOUNTER — Other Ambulatory Visit: Payer: Self-pay | Admitting: Family Medicine

## 2022-10-28 ENCOUNTER — Encounter: Payer: Self-pay | Admitting: Family Medicine

## 2022-10-28 ENCOUNTER — Other Ambulatory Visit: Payer: Self-pay | Admitting: Family Medicine

## 2022-10-28 DIAGNOSIS — M5441 Lumbago with sciatica, right side: Secondary | ICD-10-CM

## 2022-10-28 DIAGNOSIS — M5416 Radiculopathy, lumbar region: Secondary | ICD-10-CM

## 2022-10-29 ENCOUNTER — Other Ambulatory Visit: Payer: Self-pay

## 2022-10-29 ENCOUNTER — Other Ambulatory Visit: Payer: Self-pay | Admitting: Family Medicine

## 2022-10-29 ENCOUNTER — Other Ambulatory Visit (HOSPITAL_BASED_OUTPATIENT_CLINIC_OR_DEPARTMENT_OTHER): Payer: Self-pay

## 2022-10-29 MED ORDER — PANTOPRAZOLE SODIUM 40 MG PO TBEC: 40.0000 mg | DELAYED_RELEASE_TABLET | Freq: Every day | ORAL | 3 refills | Status: DC

## 2022-10-29 MED ORDER — AMPHETAMINE-DEXTROAMPHET ER 30 MG PO CP24: 60.0000 mg | ORAL_CAPSULE | ORAL | 0 refills | Status: DC

## 2022-10-29 MED ORDER — HYDROCODONE-ACETAMINOPHEN 10-325 MG PO TABS: 1.0000 | ORAL_TABLET | Freq: Three times a day (TID) | ORAL | 0 refills | Status: DC | PRN

## 2022-10-29 NOTE — Telephone Encounter (Signed)
Requesting: hydrocodone 10-325mg   Contract: 07/17/22 UDS: 12/10/20 Last Visit: 06/03/22 w/ Nani Ravens Next Visit: None Last Refill: 09/20/22 #90 and 0RF   Please Advise

## 2022-10-29 NOTE — Telephone Encounter (Signed)
Pt called to follow up on message.  Advised that it has been sent over to PCP and awaiting on her to respond.

## 2022-11-06 ENCOUNTER — Telehealth: Payer: Self-pay

## 2022-11-06 NOTE — Telephone Encounter (Signed)
PA initiated via Covermymeds; KEY: [id number]. Awaiting determination.

## 2022-11-06 NOTE — Telephone Encounter (Signed)
PA approved.   Approved. Authorization Expiration Date: 11/06/2023

## 2022-11-24 ENCOUNTER — Encounter: Payer: Self-pay | Admitting: *Deleted

## 2022-12-01 ENCOUNTER — Other Ambulatory Visit: Payer: Self-pay | Admitting: Family Medicine

## 2022-12-02 ENCOUNTER — Telehealth: Payer: Self-pay | Admitting: *Deleted

## 2022-12-02 NOTE — Telephone Encounter (Signed)
Pt not able to come in office and need to change appt to [facility]. Appointment was changed.

## 2022-12-02 NOTE — Telephone Encounter (Signed)
Who Is Calling Patient / Member / Family / Caregiver Caller Name Ajee Heasley Caller Phone Number [phone] Patient Name Nicole Ball Patient DOB 05-11-1976 Call Type Message Only Information Provided Reason for Call Request for General Office Information Initial Comment Caller states that they have an appt on the 25th that is face to face and needs it to be virtual. Disp. Time Disposition Final User 12/01/2022 6:50:58 PM General Information Provided Yes Gwen Pounds Call Closed By: Gwen Pounds Transaction Date/Time: 12/01/2022 6:48:11 PM (ET)

## 2022-12-03 NOTE — Assessment & Plan Note (Signed)
Supplement and monitor 

## 2022-12-04 ENCOUNTER — Telehealth: Payer: Self-pay

## 2022-12-04 ENCOUNTER — Telehealth (INDEPENDENT_AMBULATORY_CARE_PROVIDER_SITE_OTHER): Payer: Self-pay | Admitting: Family Medicine

## 2022-12-04 DIAGNOSIS — E669 Obesity, unspecified: Secondary | ICD-10-CM

## 2022-12-04 DIAGNOSIS — E1169 Type 2 diabetes mellitus with other specified complication: Secondary | ICD-10-CM

## 2022-12-04 DIAGNOSIS — G43009 Migraine without aura, not intractable, without status migrainosus: Secondary | ICD-10-CM

## 2022-12-04 DIAGNOSIS — M5441 Lumbago with sciatica, right side: Secondary | ICD-10-CM

## 2022-12-04 DIAGNOSIS — E782 Mixed hyperlipidemia: Secondary | ICD-10-CM

## 2022-12-04 DIAGNOSIS — B001 Herpesviral vesicular dermatitis: Secondary | ICD-10-CM

## 2022-12-04 DIAGNOSIS — R197 Diarrhea, unspecified: Secondary | ICD-10-CM

## 2022-12-04 DIAGNOSIS — M5416 Radiculopathy, lumbar region: Secondary | ICD-10-CM

## 2022-12-04 DIAGNOSIS — L309 Dermatitis, unspecified: Secondary | ICD-10-CM

## 2022-12-04 DIAGNOSIS — I10 Essential (primary) hypertension: Secondary | ICD-10-CM

## 2022-12-04 DIAGNOSIS — J019 Acute sinusitis, unspecified: Secondary | ICD-10-CM

## 2022-12-04 MED ORDER — ONDANSETRON HCL 4 MG PO TABS: ORAL_TABLET | ORAL | 1 refills | Status: DC

## 2022-12-04 MED ORDER — ESCITALOPRAM OXALATE 20 MG PO TABS
20.0000 mg | ORAL_TABLET | Freq: Every day | ORAL | 3 refills | Status: DC
Start: 2022-12-04 — End: 2023-05-04

## 2022-12-04 MED ORDER — HYDROCODONE-ACETAMINOPHEN 10-325 MG PO TABS
1.0000 | ORAL_TABLET | Freq: Three times a day (TID) | ORAL | 0 refills | Status: DC | PRN
Start: 2022-12-04 — End: 2023-01-06

## 2022-12-04 MED ORDER — AMOXICILLIN-POT CLAVULANATE 875-125 MG PO TABS: 1.0000 | ORAL_TABLET | Freq: Two times a day (BID) | ORAL | 0 refills | Status: DC

## 2022-12-04 MED ORDER — ACYCLOVIR 5 % EX OINT: 1.0000 | TOPICAL_OINTMENT | CUTANEOUS | 1 refills | Status: AC

## 2022-12-04 MED ORDER — ALPRAZOLAM 1 MG PO TABS: 1.0000 mg | ORAL_TABLET | Freq: Two times a day (BID) | ORAL | 2 refills | Status: DC | PRN

## 2022-12-04 MED ORDER — TRIAMCINOLONE ACETONIDE 0.1 % EX CREA
1.0000 | TOPICAL_CREAM | Freq: Two times a day (BID) | CUTANEOUS | 1 refills | Status: DC
Start: 2022-12-04 — End: 2023-10-30

## 2022-12-04 NOTE — Telephone Encounter (Signed)
Done

## 2022-12-04 NOTE — Assessment & Plan Note (Signed)
Roughly 3-4 weeks of increased sinus congestion and rhinorrhea, clear. Also notes cold sores on upper lip. Notes some PND and coughs up some green sputum at times. She notes being tired, low grade fevers. Notes feeling woozey especially upon arising. Encouraged increased hydration and rest, Mucinex bid.

## 2022-12-04 NOTE — Assessment & Plan Note (Signed)
Notes liquid stool for a week to 2 roughly 5 or more a day. No blood or melena.

## 2022-12-04 NOTE — Progress Notes (Signed)
MyChart Video Visit    Virtual Visit via Video Note   This visit type was conducted due to national recommendations for restrictions regarding the COVID-19 Pandemic (e.g. social distancing) in an effort to limit this patient's exposure and mitigate transmission in our community. This patient is at least at moderate risk for complications without adequate follow up. This format is felt to be most appropriate for this patient at this time. Physical exam was limited by quality of the video and audio technology used for the visit. Shamaine, CMA was able to get the patient set up on a video visit.  Patient location: home Patient and provider in visit Provider location: Office  I discussed the limitations of evaluation and management by telemedicine and the availability of in person appointments. The patient expressed understanding and agreed to proceed.  Visit Date: 12/04/2022  Today's healthcare provider: Danise Edge, MD     Subjective:    Patient ID: Nicole Ball, female    DOB: 12-08-1975, 47 y.o.   MRN: [id number]  Chief Complaint  Patient presents with   Follow-up    Follow up    HPI Patient is in today for follow up on chronic medical concerns. No recent febrile illness or hospitalizations. Denies CP/palp/SOB/HA/congestion/fevers/GI or GU c/o. Taking meds as prescribed   Past Medical History:  Diagnosis Date   Abnormal serum level of alkaline phosphatase 05/21/2014   Acute low back pain 12/19/2010   ALLERGIC RHINITIS 10/16/2010   Anxiety and depression 06/07/2011   Atrophic vaginitis 08/17/2016   Cervical cancer screening 01/10/2014   Menarche at 8 Regular and moderate flow, became irregular until OCPs  history of abnormal pap in past, repeat was normal Last pap roughly 3 years ago G0P0, s/p No history of abnormal MGM, no h/o MGM  No concerns today no gyn surgeries LMP May 2013   CTS (carpal tunnel syndrome) 01/15/2014   Diabetes mellitus type 2 in obese (HCC) 10/30/2011    Dry eyes 08/17/2016   Early menopause    Essential hypertension    HIATAL HERNIA WITH REFLUX, HX OF 10/16/2010   Hyperlipidemia 04/21/2011   Inappropriate sinus tachycardia    INSOMNIA 10/16/2010   Knee pain, left 03/11/2012   Migraine 03/07/2013   Morbid obesity (HCC)    Palpitations 01/28/2015   Reflux 04/22/2012   Sleep apnea 06/07/2011   Tobacco use disorder 02/10/2017   Urinary urgency 08/17/2016   UTI'S, HX OF 10/16/2010    Past Surgical History:  Procedure Laterality Date   CHOLECYSTECTOMY      Family History  Problem Relation Age of Onset   Lupus Mother    Lung disease Mother    Cancer Father        cigarette, alcohol HEENT squamous cell   Alcohol abuse Father    Thyroid disease Sister    Asthma Sister    Allergies Brother    Alcohol abuse Maternal Aunt    Alcohol abuse Maternal Uncle    Arthritis Maternal Grandmother    Scleroderma Maternal Grandmother    Glaucoma Maternal Grandmother    Cataracts Maternal Grandmother    Cancer Maternal Grandfather        prostate   Coronary artery disease Maternal Grandfather        s/p angioplasty   Diabetes Maternal Grandfather        Type 2   Cancer Paternal Grandmother        Breast ca- dx in 50's   Thyroid disease Paternal Grandmother  Breast cancer Paternal Grandmother    Diabetes Paternal Grandfather    Arthritis Other     Social History   Socioeconomic History   Marital status: Married    Spouse name: Not on file   Number of children: Not on file   Years of education: Not on file   Highest education level: Not on file  Occupational History   Not on file  Tobacco Use   Smoking status: Some Days    Packs/day: .1    Types: Cigars, Cigarettes   Smokeless tobacco: Never   Tobacco comments:    1 black and milds  Vaping Use   Vaping Use: Never used  Substance and Sexual Activity   Alcohol use: Yes    Comment: twice/year   Drug use: No   Sexual activity: Not on file  Other Topics Concern   Not on file  Social  History Narrative   Married lives w/ husband, works at [employer]   Social Determinants of Corporate investment banker Strain: Not on file  Food Insecurity: Not on file  Transportation Needs: Not on file  Physical Activity: Not on file  Stress: Not on file  Social Connections: Not on file  Intimate Partner Violence: Not on file    Outpatient Medications Prior to Visit  Medication Sig Dispense Refill   ACETAMINOPHEN-BUTALBITAL 50-325 MG TABS TAKE 1 TABLET BY MOUTH TWICE DAILY AS NEEDED 60 tablet 0   albuterol (VENTOLIN HFA) 108 (90 Base) MCG/ACT inhaler INHALE 2 PUFFS INTO THE LUNGS EVERY 6 HOURS AS NEEDED FOR WHEEZING OR SHORTNESS OF BREATH 8.5 g 1   amphetamine-dextroamphetamine (ADDERALL XR) 30 MG 24 hr capsule Take 2 capsules (60 mg total) by mouth every morning. 60 capsule 0   azelastine (ASTELIN) 0.1 % nasal spray USE 2 SPRAYS INTO BOTH NOSTRILS TWICE DAILY AS DIRECTED 30 mL 12   baclofen (LIORESAL) 10 MG tablet TAKE 1 TABLET(10 MG) BY MOUTH THREE TIMES DAILY 30 tablet 1   BLACK COHOSH PO Take 1 tablet by mouth daily.     celecoxib (CELEBREX) 200 MG capsule One to 2 tablets by mouth daily as needed for pain. 60 capsule 2   conjugated estrogens (PREMARIN) vaginal cream Apply a small amount to affected area twice a week. 30 g 1   fluconazole (DIFLUCAN) 150 MG tablet Take 1 tab, repeat in 72 hours if no improvement. 2 tablet 0   fluticasone (FLONASE) 50 MCG/ACT nasal spray SHAKE LIQUID AND USE 2 SPRAYS IN EACH NOSTRIL DAILY 16 g 3   fluticasone (FLOVENT HFA) 220 MCG/ACT inhaler Inhale 1 puff into the lungs in the morning and at bedtime. 1 each 1   gabapentin (NEURONTIN) 600 MG tablet TAKE 1 TABLET(600 MG) BY MOUTH THREE TIMES DAILY 90 tablet 1   Ginger, Zingiber officinalis, (GINGER PO) Take 1 tablet by mouth daily.     glucose blood (ONETOUCH VERIO) test strip 1 each by Other route daily in the afternoon. Use as instructed 100 each 6   HYDROCORTISONE, TOPICAL, 2 % LOTN Apply 1  Dose topically daily as needed. 59.2 mL 1   hyoscyamine (LEVSIN SL) 0.125 MG SL tablet Take 1 tablet (0.125 mg total) by mouth every 4 (four) hours as needed. 30 tablet 0   levocetirizine (XYZAL) 5 MG tablet Take 1 tablet (5 mg total) by mouth in the morning and at bedtime. 60 tablet 5   losartan (COZAAR) 50 MG tablet Take 1 tablet (50 mg total) by mouth daily. 90  tablet 1   metFORMIN (GLUCOPHAGE) 500 MG tablet TAKE 2 TABLETS(1000 MG) BY MOUTH TWICE DAILY 360 tablet 3   montelukast (SINGULAIR) 10 MG tablet Take 1 tablet (10 mg total) by mouth at bedtime. 90 tablet 1   mupirocin ointment (BACTROBAN) 2 % APPLY TO NOSTRILS WITH A CLEAN Q-TIP EVERY NIGHT AT BEDTIME 22 g 1   pantoprazole (PROTONIX) 40 MG tablet Take 1 tablet (40 mg total) by mouth daily. 30 tablet 3   Probiotic Product (PROBIOTIC PO) Take 1 tablet by mouth daily.     promethazine (PHENERGAN) 25 MG tablet TAKE 1 TABLET(25 MG) BY MOUTH EVERY 8 HOURS AS NEEDED FOR NAUSEA OR VOMITING 30 tablet 1   propranolol (INDERAL) 60 MG tablet Take 1 tablet (60 mg total) by mouth 2 (two) times daily. 180 tablet 0   rosuvastatin (CRESTOR) 5 MG tablet TAKE 1 TABLET(5 MG) BY MOUTH 3 TIMES A WEEK 36 tablet 3   sodium chloride (OCEAN) 0.65 % SOLN nasal spray Place 1 spray into both nostrils daily.     SUMAtriptan (IMITREX) 50 MG tablet May repeat in 2 hours if headache persists or recurs. 10 tablet 3   venlafaxine XR (EFFEXOR-XR) 75 MG 24 hr capsule Take 1 capsule (75 mg total) by mouth daily with breakfast. 90 capsule 0   zolpidem (AMBIEN) 10 MG tablet Take 1 tablet (10 mg total) by mouth at bedtime as needed for sleep. 30 tablet 2   ALPRAZolam (XANAX) 1 MG tablet TAKE 1 TABLET(1 MG) BY MOUTH TWICE DAILY AS NEEDED FOR ANXIETY 30 tablet 0   Dulaglutide (TRULICITY) 0.75 MG/0.5ML SOPN Inject 0.75mg  under the skin weekly 2 mL 0   FLUoxetine (PROZAC) 40 MG capsule Take 1 capsule (40 mg total) by mouth daily. 90 capsule 1   HYDROcodone-acetaminophen (NORCO)  10-325 MG tablet Take 1 tablet by mouth every 8 (eight) hours as needed. 90 tablet 0   ondansetron (ZOFRAN) 4 MG tablet TAKE 1 TABLET(4 MG) BY MOUTH EVERY 8 HOURS AS NEEDED FOR NAUSEA OR VOMITING 40 tablet 1   No facility-administered medications prior to visit.    No Known Allergies  Review of Systems  Constitutional:  Negative for fever and malaise/fatigue.  HENT:  Positive for congestion.   Eyes:  Negative for blurred vision.  Respiratory:  Negative for shortness of breath.   Cardiovascular:  Negative for chest pain, palpitations and leg swelling.  Gastrointestinal:  Positive for diarrhea. Negative for abdominal pain, blood in stool and nausea.  Genitourinary:  Negative for dysuria and frequency.  Musculoskeletal:  Negative for falls.  Skin:  Positive for itching and rash.  Neurological:  Positive for headaches. Negative for dizziness and loss of consciousness.  Endo/Heme/Allergies:  Negative for environmental allergies.  Psychiatric/Behavioral:  Negative for depression. The patient is not nervous/anxious.        Objective:    Physical Exam Constitutional:      General: She is not in acute distress.    Appearance: Normal appearance. She is not ill-appearing or toxic-appearing.  HENT:     Head: Normocephalic and atraumatic.     Right Ear: External ear normal.     Left Ear: External ear normal.     Nose: Nose normal.  Eyes:     General:        Right eye: No discharge.        Left eye: No discharge.  Pulmonary:     Effort: Pulmonary effort is normal.  Skin:    Findings: No  rash.  Neurological:     Mental Status: She is alert and oriented to person, place, and time.  Psychiatric:        Behavior: Behavior normal.     LMP 12/10/2012  Wt Readings from Last 3 Encounters:  12/24/21 180 lb 9.6 oz (81.9 kg)  06/18/21 179 lb 9.6 oz (81.5 kg)  03/28/21 179 lb (81.2 kg)       Assessment & Plan:  Type 2 diabetes mellitus with obesity (HCC) Assessment &  Plan: Supplement and monitor   Orders: -     Hemoglobin A1c; Future  Diarrhea, unspecified type Assessment & Plan: Notes liquid stool for a week to 2 roughly 5 or more a day. No blood or melena.    Acute sinusitis, recurrence not specified, unspecified location Assessment & Plan: Roughly 3-4 weeks of increased sinus congestion and rhinorrhea, clear. Also notes cold sores on upper lip. Notes some PND and coughs up some green sputum at times. She notes being tired, low grade fevers. Notes feeling woozey especially upon arising. Encouraged increased hydration and rest, Mucinex bid.    Migraine without aura and without status migrainosus, not intractable Assessment & Plan: Encouraged increased hydration, 64 ounces of clear fluids daily. Minimize alcohol and caffeine. Eat small frequent meals with lean proteins and complex carbs. Avoid high and low blood sugars. Get adequate sleep, 7-8 hours a night. Needs exercise daily preferably in the morning.   Primary hypertension -     CBC with Differential/Platelet; Future -     Comprehensive metabolic panel; Future -     TSH; Future  Mixed hyperlipidemia -     Lipid panel; Future  Lumbar radiculopathy -     HYDROcodone-Acetaminophen; Take 1 tablet by mouth every 8 (eight) hours as needed.  Dispense: 90 tablet; Refill: 0  Bilateral low back pain with right-sided sciatica, unspecified chronicity Assessment & Plan: Encouraged moist heat and gentle stretching as tolerated. May try NSAIDs and prescription meds as directed and report if symptoms worsen or seek immediate care   Orders: -     HYDROcodone-Acetaminophen; Take 1 tablet by mouth every 8 (eight) hours as needed.  Dispense: 90 tablet; Refill: 0  Dermatitis Assessment & Plan: Has had some flares with stress, topical steroids sparingly   Recurrent cold sores Assessment & Plan: Try Acyclovir topical   Other orders -     Amoxicillin-Pot Clavulanate; Take 1 tablet by mouth 2 (two)  times daily.  Dispense: 20 tablet; Refill: 0 -     Escitalopram Oxalate; Take 1 tablet (20 mg total) by mouth daily.  Dispense: 30 tablet; Refill: 3 -     ALPRAZolam; Take 1 tablet (1 mg total) by mouth 2 (two) times daily as needed for anxiety.  Dispense: 30 tablet; Refill: 2 -     Ondansetron HCl; TAKE 1 TABLET(4 MG) BY MOUTH EVERY 8 HOURS AS NEEDED FOR NAUSEA OR VOMITING  Dispense: 40 tablet; Refill: 1 -     Acyclovir; Apply 1 Application topically every 3 (three) hours.  Dispense: 30 g; Refill: 1 -     Triamcinolone Acetonide; Apply 1 Application topically 2 (two) times daily.  Dispense: 80 g; Refill: 1     I discussed the assessment and treatment plan with the patient. The patient was provided an opportunity to ask questions and all were answered. The patient agreed with the plan and demonstrated an understanding of the instructions.   The patient was advised to call back or seek an in-person evaluation  if the symptoms worsen or if the condition fails to improve as anticipated.  Danise Edge, MD [facility] [phone] (phone) [phone] (fax)  [facility]

## 2022-12-07 DIAGNOSIS — B001 Herpesviral vesicular dermatitis: Secondary | ICD-10-CM | POA: Insufficient documentation

## 2022-12-07 NOTE — Assessment & Plan Note (Signed)
Has had some flares with stress, topical steroids sparingly

## 2022-12-07 NOTE — Assessment & Plan Note (Signed)
Encouraged increased hydration, 64 ounces of clear fluids daily. Minimize alcohol and caffeine. Eat small frequent meals with lean proteins and complex carbs. Avoid high and low blood sugars. Get adequate sleep, 7-8 hours a night. Needs exercise daily preferably in the morning.  

## 2022-12-07 NOTE — Assessment & Plan Note (Signed)
Encouraged moist heat and gentle stretching as tolerated. May try NSAIDs and prescription meds as directed and report if symptoms worsen or seek immediate care 

## 2022-12-07 NOTE — Assessment & Plan Note (Signed)
Try Acyclovir topical

## 2022-12-18 ENCOUNTER — Other Ambulatory Visit: Payer: Self-pay | Admitting: Family Medicine

## 2022-12-22 ENCOUNTER — Other Ambulatory Visit: Payer: Self-pay | Admitting: Family Medicine

## 2022-12-23 ENCOUNTER — Other Ambulatory Visit: Payer: Self-pay | Admitting: Family Medicine

## 2022-12-23 MED ORDER — FLUCONAZOLE 150 MG PO TABS: 150.0000 mg | ORAL_TABLET | ORAL | 1 refills | Status: DC

## 2022-12-31 ENCOUNTER — Telehealth: Payer: Self-pay | Admitting: Family Medicine

## 2022-12-31 ENCOUNTER — Other Ambulatory Visit: Payer: Self-pay

## 2022-12-31 MED ORDER — PROPRANOLOL HCL 60 MG PO TABS: 60.0000 mg | ORAL_TABLET | Freq: Two times a day (BID) | ORAL | 0 refills | Status: DC

## 2022-12-31 NOTE — Telephone Encounter (Signed)
Prescription Request  12/31/2022  Is this a "Controlled Substance" medicine? No  LOV: Visit date not found  What is the name of the medication or equipment?  propranolol (INDERAL) 60 MG tablet   Have you contacted your pharmacy to request a refill? Yes   Which pharmacy would you like this sent to?  [id number] [place], [place] - [address] AT [facility] [address] [place] [address] Phone: [phone] Fax: [phone]    Patient notified that their request is being sent to the clinical staff for review and that they should receive a response within 2 business days.   Please advise at [facility] [phone]

## 2022-12-31 NOTE — Telephone Encounter (Signed)
Medication sent.

## 2023-01-06 ENCOUNTER — Other Ambulatory Visit: Payer: Self-pay | Admitting: Family Medicine

## 2023-01-06 DIAGNOSIS — M5416 Radiculopathy, lumbar region: Secondary | ICD-10-CM

## 2023-01-06 DIAGNOSIS — M5441 Lumbago with sciatica, right side: Secondary | ICD-10-CM

## 2023-01-07 MED ORDER — HYDROCODONE-ACETAMINOPHEN 10-325 MG PO TABS
1.0000 | ORAL_TABLET | Freq: Three times a day (TID) | ORAL | 0 refills | Status: DC | PRN
Start: 2023-01-07 — End: 2023-02-19

## 2023-01-07 NOTE — Telephone Encounter (Signed)
Requesting: HYDROcodone-acetaminophen (NORCO) 10-325 MG tablet  Take 1 tablet by mouth every 8 (eight) hours as needed.  Contract: 07/17/2022 UDS: 12/10/2020 Last Visit: 12/04/2022 Next Visit: 04/02/2023 Last Refill: 12/04/2022 #90 no refills  Please Advise

## 2023-01-12 ENCOUNTER — Telehealth: Payer: Self-pay | Admitting: Family Medicine

## 2023-01-12 NOTE — Telephone Encounter (Signed)
Called pt back lvm returning call back.To give our office call back.

## 2023-01-12 NOTE — Telephone Encounter (Signed)
Pt has been having really bad migraines and would like a call back from CMA. She needs to ask a question.

## 2023-01-13 NOTE — Telephone Encounter (Signed)
Spoke with patient. She states she missed three days of work last week. Having more frequent migraines. Being on the computer and the screen/stress of job seems to be exacerbating symptoms. She is treating with Imitrex with helps symptoms but does not make the migraines go away. Sensitive to light/sound. Feels like blood pressure and sugars are elevated.   I advised patient to make an appt to discuss all these things. Transferred to front desk to schedule. I advised looking into FMLA if chronic issue and missing work due to migraines, but she is not interested. Aware we can not write a note for her days missed last week because she wasn't seen/didn't call us.   Dr. Abner Greenspan - FYI.

## 2023-01-14 ENCOUNTER — Encounter: Payer: Self-pay | Admitting: Family Medicine

## 2023-01-14 ENCOUNTER — Telehealth (INDEPENDENT_AMBULATORY_CARE_PROVIDER_SITE_OTHER): Payer: Self-pay | Admitting: Family Medicine

## 2023-01-14 DIAGNOSIS — G43109 Migraine with aura, not intractable, without status migrainosus: Secondary | ICD-10-CM

## 2023-01-14 DIAGNOSIS — I10 Essential (primary) hypertension: Secondary | ICD-10-CM

## 2023-01-14 NOTE — Progress Notes (Signed)
Virtual Video Visit via MyChart Note  I connected with  [name] on 01/14/23 at  3:20 PM EDT by the video enabled telemedicine application for MyChart, and verified that I am speaking with the correct person using two identifiers.   I introduced myself as a Publishing rights manager with the practice. We discussed the limitations of evaluation and management by telemedicine and the availability of in person appointments. The patient expressed understanding and agreed to proceed.  Participating parties in this visit include: The patient and the nurse practitioner listed.  The patient is: in parked car I am: In the office - [facility]  Subjective:    CC: HTN, migraines   HPI: Nicole Ball is a 47 y.o. year old female presenting today via MyChart today for headaches.   She was recently on Augmentin for sinusitis. States her blood pressure has been high since taking the Augmentin and she has had an increase in migraines the past few days - could be related to increased screen time with work lately. She has had occasional dizziness with the headaches. She did end up missing a few days of work last week related to her symptoms. States she did not actually check her blood pressure, but just felt like it was high. She tried to stay hydrated and a few times she took an extra losartan pill.  States she is feeling a little better today, but she does have a headache today at work. She thinks the phone/computer time at work is contributing to her symptoms as well - she is going to try to go part-time to see if that will help. Reports when the headache was most severe she did have some mild dyspnea, but has not been that severe since then. Migraine headaches are typically behind her left eye/temple, nausea, sensitivity to light and sound. Aura - flashing lights/spots. Patient denies any chest pain, palpitations, dyspnea, wheezing, edema, vision changes.    Requesting  work note saying she has several medically managed health conditions (including hypertension and migraines - patient consented for release of information) that are impacted by excessive screen time and overhead lighting. At virtual appointment today, she does mention that she missed 3 days last week related to her condition/symptoms.       Past medical history, Surgical history, Family history not pertinant except as noted below, Social history, Allergies, and medications have been entered into the medical record, reviewed, and corrections made.   Review of Systems:  All review of systems negative except what is listed in the HPI   Objective:    General:  Speaking clearly in complete sentences. Absent shortness of breath noted.   Alert and oriented x3.   Normal judgment.  Absent acute distress.   Impression and Recommendations:    1. Primary hypertension  2. Migraine with aura and without status migrainosus, not intractable  Patient did not have any blood pressure readings to report today. Advised she start monitoring her blood pressure and follow-up in our office. If stable at home, she can do a BP check with nurse as she is scheduled to see PCP in 2 months; if uncontrolled, she needs to be seen by provider sooner. We discussed lifestyle measures for blood pressure management and migraine prevention. Patient aware of signs/symptoms requiring further/urgent evaluation.       Follow-up if symptoms worsen or fail to improve.    I discussed the assessment and treatment plan with the patient. The patient  was provided an opportunity to ask questions and all were answered. The patient agreed with the plan and demonstrated an understanding of the instructions.   The patient was advised to call back or seek an in-person evaluation if the symptoms worsen or if the condition fails to improve as anticipated.    Nicole Dana, NP

## 2023-01-16 ENCOUNTER — Encounter: Payer: Self-pay | Admitting: Family Medicine

## 2023-01-24 ENCOUNTER — Other Ambulatory Visit: Payer: Self-pay | Admitting: Family Medicine

## 2023-01-27 ENCOUNTER — Other Ambulatory Visit: Payer: Self-pay | Admitting: Family Medicine

## 2023-01-27 DIAGNOSIS — I4711 Inappropriate sinus tachycardia, so stated: Secondary | ICD-10-CM

## 2023-01-27 DIAGNOSIS — E782 Mixed hyperlipidemia: Secondary | ICD-10-CM

## 2023-01-27 DIAGNOSIS — I10 Essential (primary) hypertension: Secondary | ICD-10-CM

## 2023-02-19 ENCOUNTER — Other Ambulatory Visit: Payer: Self-pay | Admitting: Family Medicine

## 2023-02-19 DIAGNOSIS — M5416 Radiculopathy, lumbar region: Secondary | ICD-10-CM

## 2023-02-19 DIAGNOSIS — M5441 Lumbago with sciatica, right side: Secondary | ICD-10-CM

## 2023-02-19 MED ORDER — AMPHETAMINE-DEXTROAMPHET ER 30 MG PO CP24: 60.0000 mg | ORAL_CAPSULE | ORAL | 0 refills | Status: DC

## 2023-02-19 MED ORDER — HYDROCODONE-ACETAMINOPHEN 10-325 MG PO TABS
1.0000 | ORAL_TABLET | Freq: Three times a day (TID) | ORAL | 0 refills | Status: DC | PRN
Start: 2023-02-19 — End: 2023-05-05

## 2023-02-19 MED ORDER — BUTALBITAL-ACETAMINOPHEN 50-325 MG PO TABS: 1.0000 | ORAL_TABLET | Freq: Two times a day (BID) | ORAL | 0 refills | Status: DC | PRN

## 2023-02-19 NOTE — Telephone Encounter (Signed)
Requesting: acetaminophen-butalbital, adderall xr 30mg , hydrocodone  Contract: 07/17/22 UDS:12/10/20 Last Visit: 01/14/23 w/ Ladona Ridgel Next Visit: 04/02/23  Last Refill on acetaminophen-butalbital: 09/10/22 # 60 and 0RF Last Refill on adderall xr 30mg : 10/29/22 #60 and 0RF Last Refill: hydrocodone 01/07/23 #90 and 0RF  Please Advise

## 2023-02-23 ENCOUNTER — Telehealth: Payer: Self-pay

## 2023-02-23 NOTE — Telephone Encounter (Signed)
PA initiated via Covermymeds; KEY: [id number]. Awaiting determination.

## 2023-02-23 NOTE — Telephone Encounter (Signed)
PA initiated via Covermymeds; KEY: [id number].  Drug is covered by current benefit plan. No further PA activity needed.

## 2023-02-24 NOTE — Telephone Encounter (Signed)
PA approved.   [id number];XLKGMW:[id number];Review Type:Qty;Coverage Start Date:02/10/2023;Coverage End Date:02/24/2024;

## 2023-03-03 ENCOUNTER — Other Ambulatory Visit: Payer: Self-pay | Admitting: Family Medicine

## 2023-03-08 ENCOUNTER — Other Ambulatory Visit: Payer: Self-pay | Admitting: Family Medicine

## 2023-03-11 ENCOUNTER — Encounter (INDEPENDENT_AMBULATORY_CARE_PROVIDER_SITE_OTHER): Payer: Self-pay

## 2023-03-16 ENCOUNTER — Other Ambulatory Visit: Payer: Self-pay | Admitting: Family Medicine

## 2023-03-19 ENCOUNTER — Other Ambulatory Visit: Payer: Self-pay | Admitting: Family Medicine

## 2023-04-02 ENCOUNTER — Ambulatory Visit: Payer: Self-pay | Admitting: Family Medicine

## 2023-04-11 ENCOUNTER — Other Ambulatory Visit: Payer: Self-pay | Admitting: Family Medicine

## 2023-04-13 ENCOUNTER — Other Ambulatory Visit: Payer: Self-pay | Admitting: Family Medicine

## 2023-04-14 MED ORDER — PROPRANOLOL HCL 60 MG PO TABS: 60.0000 mg | ORAL_TABLET | Freq: Two times a day (BID) | ORAL | 0 refills | Status: DC

## 2023-04-14 MED ORDER — ZOLPIDEM TARTRATE 10 MG PO TABS: 10.0000 mg | ORAL_TABLET | Freq: Every evening | ORAL | 2 refills | Status: DC | PRN

## 2023-04-14 NOTE — Telephone Encounter (Signed)
Requesting: Ambien 10 mg  Contract: 07/17/2022 UDS: 12/10/2020 Last Visit: 01/14/2023 Ladona Ridgel) Next Visit: 05/12/2023 Ladona Ridgel) Last Refill: 08/07/2022 #30 with 2 refills  Please Advise

## 2023-05-04 ENCOUNTER — Other Ambulatory Visit: Payer: Self-pay | Admitting: Family Medicine

## 2023-05-05 ENCOUNTER — Other Ambulatory Visit (HOSPITAL_BASED_OUTPATIENT_CLINIC_OR_DEPARTMENT_OTHER): Payer: Self-pay

## 2023-05-05 ENCOUNTER — Encounter: Payer: Self-pay | Admitting: Family Medicine

## 2023-05-05 ENCOUNTER — Ambulatory Visit (INDEPENDENT_AMBULATORY_CARE_PROVIDER_SITE_OTHER): Payer: Self-pay | Admitting: Family Medicine

## 2023-05-05 ENCOUNTER — Other Ambulatory Visit (HOSPITAL_COMMUNITY)
Admission: RE | Admit: 2023-05-05 | Discharge: 2023-05-05 | Disposition: A | Discharge status: Home / Self Care | Payer: Self-pay | Source: Ambulatory Visit | Attending: Family Medicine | Admitting: Family Medicine

## 2023-05-05 VITALS — BP 142/93 | HR 80 | Ht 63.0 in | Wt 182.0 lb

## 2023-05-05 DIAGNOSIS — F909 Attention-deficit hyperactivity disorder, unspecified type: Secondary | ICD-10-CM

## 2023-05-05 DIAGNOSIS — Z7984 Long term (current) use of oral hypoglycemic drugs: Secondary | ICD-10-CM

## 2023-05-05 DIAGNOSIS — E782 Mixed hyperlipidemia: Secondary | ICD-10-CM

## 2023-05-05 DIAGNOSIS — T7840XD Allergy, unspecified, subsequent encounter: Secondary | ICD-10-CM

## 2023-05-05 DIAGNOSIS — E669 Obesity, unspecified: Secondary | ICD-10-CM

## 2023-05-05 DIAGNOSIS — M5441 Lumbago with sciatica, right side: Secondary | ICD-10-CM

## 2023-05-05 DIAGNOSIS — E1169 Type 2 diabetes mellitus with other specified complication: Secondary | ICD-10-CM

## 2023-05-05 DIAGNOSIS — Z113 Encounter for screening for infections with a predominantly sexual mode of transmission: Secondary | ICD-10-CM

## 2023-05-05 DIAGNOSIS — Z79899 Other long term (current) drug therapy: Secondary | ICD-10-CM

## 2023-05-05 DIAGNOSIS — I10 Essential (primary) hypertension: Secondary | ICD-10-CM

## 2023-05-05 DIAGNOSIS — M5416 Radiculopathy, lumbar region: Secondary | ICD-10-CM

## 2023-05-05 DIAGNOSIS — N898 Other specified noninflammatory disorders of vagina: Secondary | ICD-10-CM | POA: Insufficient documentation

## 2023-05-05 DIAGNOSIS — F411 Generalized anxiety disorder: Secondary | ICD-10-CM

## 2023-05-05 LAB — COMPREHENSIVE METABOLIC PANEL
ALT: 13 U/L (ref 0–35)
AST: 9 U/L (ref 0–37)
Albumin: 4 g/dL (ref 3.5–5.2)
Alkaline Phosphatase: 170 U/L — ABNORMAL HIGH (ref 39–117)
BUN: 9 mg/dL (ref 6–23)
CO2: 23 mEq/L (ref 19–32)
Calcium: 9.4 mg/dL (ref 8.4–10.5)
Chloride: 102 mEq/L (ref 96–112)
Creatinine, Ser: 0.84 mg/dL (ref 0.40–1.20)
GFR: 81.88 mL/min (ref >60.00)
Glucose, Bld: 331 mg/dL — ABNORMAL HIGH (ref 70–99)
Potassium: 3.8 mEq/L (ref 3.5–5.1)
Sodium: 136 mEq/L (ref 135–145)
Total Bilirubin: 0.2 mg/dL (ref 0.2–1.2)
Total Protein: 6.7 g/dL (ref 6.0–8.3)

## 2023-05-05 LAB — LIPID PANEL
Cholesterol: 163 mg/dL (ref 0–200)
HDL: 67.9 mg/dL (ref >39.00)
LDL Cholesterol: 70 mg/dL (ref 0–99)
NonHDL: 95.26
Total CHOL/HDL Ratio: 2
Triglycerides: 126 mg/dL (ref 0.0–149.0)
VLDL: 25.2 mg/dL (ref 0.0–40.0)

## 2023-05-05 LAB — CBC WITH DIFFERENTIAL/PLATELET
Basophils Absolute: 0.1 10*3/uL (ref 0.0–0.1)
Basophils Relative: 1 % (ref 0.0–3.0)
Eosinophils Absolute: 0.1 10*3/uL (ref 0.0–0.7)
Eosinophils Relative: 1.9 % (ref 0.0–5.0)
HCT: 36.8 % (ref 36.0–46.0)
Hemoglobin: 11.5 g/dL — ABNORMAL LOW (ref 12.0–15.0)
Lymphocytes Relative: 33.1 % (ref 12.0–46.0)
Lymphs Abs: 1.8 10*3/uL (ref 0.7–4.0)
MCHC: 31.4 g/dL (ref 30.0–36.0)
MCV: 91.1 fl (ref 78.0–100.0)
Monocytes Absolute: 0.4 10*3/uL (ref 0.1–1.0)
Monocytes Relative: 6.5 % (ref 3.0–12.0)
Neutro Abs: 3.2 10*3/uL (ref 1.4–7.7)
Neutrophils Relative %: 57.5 % (ref 43.0–77.0)
Platelets: 192 10*3/uL (ref 150.0–400.0)
RBC: 4.04 Mil/uL (ref 3.87–5.11)
RDW: 14.8 % (ref 11.5–15.5)
WBC: 5.6 10*3/uL (ref 4.0–10.5)

## 2023-05-05 LAB — HEMOGLOBIN A1C: Hgb A1c MFr Bld: 9.9 % — ABNORMAL HIGH (ref 4.6–6.5)

## 2023-05-05 LAB — TSH: TSH: 1.19 u[IU]/mL (ref 0.35–5.50)

## 2023-05-05 MED ORDER — HYDROCODONE-ACETAMINOPHEN 10-325 MG PO TABS
1.0000 | ORAL_TABLET | Freq: Three times a day (TID) | ORAL | 0 refills | Status: DC | PRN
Start: 2023-05-05 — End: 2023-06-25

## 2023-05-05 MED ORDER — FLUTICASONE PROPIONATE HFA 220 MCG/ACT IN AERO
1.0000 | INHALATION_SPRAY | Freq: Two times a day (BID) | RESPIRATORY_TRACT | 1 refills | Status: DC
Start: 2023-05-05 — End: 2023-12-22

## 2023-05-05 MED ORDER — BLOOD GLUCOSE TEST VI STRP
1.0000 | ORAL_STRIP | Freq: Three times a day (TID) | 0 refills | Status: AC
Start: 2023-05-05 — End: 2023-06-04

## 2023-05-05 MED ORDER — BLOOD GLUCOSE MONITORING SUPPL DEVI
1.0000 | Freq: Three times a day (TID) | 0 refills | Status: AC
Start: 2023-05-05 — End: ?

## 2023-05-05 MED ORDER — GABAPENTIN 600 MG PO TABS
600.0000 mg | ORAL_TABLET | Freq: Three times a day (TID) | ORAL | 1 refills | Status: DC
Start: 2023-05-05 — End: 2023-10-29

## 2023-05-05 MED ORDER — LANCET DEVICE MISC
1.0000 | Freq: Three times a day (TID) | 0 refills | Status: AC
Start: 2023-05-05 — End: 2023-06-04

## 2023-05-05 MED ORDER — AMPHETAMINE-DEXTROAMPHET ER 30 MG PO CP24: 60.0000 mg | ORAL_CAPSULE | ORAL | 0 refills | Status: DC

## 2023-05-05 MED ORDER — ALPRAZOLAM 1 MG PO TABS
1.0000 mg | ORAL_TABLET | Freq: Two times a day (BID) | ORAL | 0 refills | Status: DC | PRN
Start: 2023-05-05 — End: 2023-06-25

## 2023-05-05 MED ORDER — LANCETS MISC. MISC
1.0000 | Freq: Three times a day (TID) | 0 refills | Status: AC
Start: 2023-05-05 — End: 2023-06-04

## 2023-05-05 MED ORDER — INFLUENZA VIRUS VACC SPLIT PF (FLUZONE) 0.5 ML IM SUSY
0.5000 mL | PREFILLED_SYRINGE | Freq: Once | INTRAMUSCULAR | 0 refills | Status: AC
  Filled 2023-05-05: qty 0.5, 1d supply, fill #0

## 2023-05-05 NOTE — Assessment & Plan Note (Signed)
Allergy/?Asthma Increased use of Albuterol inhaler (2-3 times/week) over the past 3 weeks. Currently on Montelukast daily and Flonase nasal spray. -Refill Flovent inhaler for scheduled use morning and night to prevent asthma flares.

## 2023-05-05 NOTE — Assessment & Plan Note (Signed)
Blood pressure elevated today, but patient had not taken medication yet. -Advise patient to monitor blood pressure at home for 2 weeks and send readings to Dr. Vedia Coffer for possible medication adjustment. Unable to come in for nurse visit recheck as she will be staying in [place] for awhile. -Continue Propranolol 60mg  twice daily.

## 2023-05-05 NOTE — Assessment & Plan Note (Signed)
Medication management: rosuvastatin  Lifestyle factors for lowering cholesterol include: Diet therapy - heart-healthy diet rich in fruits, veggies, fiber-rich whole grains, lean meats, chicken, fish (at least twice a week), fat-free or 1% dairy products; foods low in saturated/trans fats, cholesterol, sodium, and sugar. Mediterranean diet has shown to be very heart healthy. Regular exercise - recommend at least 30 minutes a day, 5 times per week Weight management  Repeat CMP and lipid panel today

## 2023-05-05 NOTE — Assessment & Plan Note (Signed)
Increased stress regarding life events No SI/HI Requesting Xanax refill PDMP reviewed. UDS/Contract today

## 2023-05-05 NOTE — Assessment & Plan Note (Signed)
See above

## 2023-05-05 NOTE — Assessment & Plan Note (Signed)
Non-compliant with glucose monitoring. -Order new glucose monitoring kit. -Continue Metformin 1000mg  twice daily. -Check labs today

## 2023-05-05 NOTE — Progress Notes (Signed)
Established Patient Office Visit  Subjective   Patient ID: Nicole Ball, female    DOB: 05-Aug-1976  Age: 47 y.o. MRN: [id number]  Chief Complaint  Patient presents with   Medical Management of Chronic Issues    HPI  Discussed the use of AI scribe software for clinical note transcription with the patient, who gave verbal consent to proceed.  History of Present Illness   The patient, with a history of diabetes, presents for a regular follow-up. She admits to not monitoring her blood glucose levels as recommended and express a need for new diabetes supplies, including a glucose monitoring machine. The patient has been experiencing significant stress and a sense of disorganization due to recent life changes, which may be impacting her diabetes management.  In addition to her diabetes, the patient reports a week-long episode of vaginal itching, which she describes as 'very itchy.' She notes a clear discharge but deny the presence of white, clumpy, or yellow discharge. The patient has been using Premarin intermittently for symptom management.  The patient also reports persistent sinus symptoms and increased use of her albuterol inhaler over the past three weeks, approximately two to three times per week. She has been experiencing fatigue, which she attributes to her sinus symptoms and possible allergy/asthma exacerbation. She is currently on a regimen of allergy medication, including Flonase and Montelukast, and report taking these medications regularly. She has not been using her Flovent inhaler.  Regarding her diabetes management, the patient is currently on metformin, but she has not been checking her blood glucose levels regularly. She is also on rosuvastatin for cholesterol management and propranolol for blood pressure control. The patient reports not checking her blood pressure regularly at home but recalls experiencing headaches during a period of high stress when she believed her blood  pressure was elevated.  The patient is also on a regimen of controlled substances, including Xanax, hydrocodone-acetaminophen, Ambien, Adderall, and gabapentin, for anxiety, pain, insomnia, and nerve pain. She expresses a need for refills on several of these medications. The patient also takes Effexor for stress management and denies any thoughts of self-harm.                ROS All review of systems negative except what is listed in the HPI    Objective:     BP (!) 142/93   Pulse 80   Ht 5\' 3"  (1.6 m)   Wt 182 lb (82.6 kg)   LMP 12/10/2012   SpO2 100%   BMI 32.24 kg/m    Physical Exam Vitals reviewed.  Constitutional:      Appearance: Normal appearance.  Cardiovascular:     Rate and Rhythm: Normal rate and regular rhythm.     Heart sounds: Normal heart sounds.  Pulmonary:     Effort: Pulmonary effort is normal.     Breath sounds: Normal breath sounds. No wheezing, rhonchi or rales.  Musculoskeletal:     Right lower leg: No edema.     Left lower leg: No edema.  Skin:    General: Skin is warm and dry.  Neurological:     Mental Status: She is alert and oriented to person, place, and time.  Psychiatric:        Mood and Affect: Mood normal.        Behavior: Behavior normal.        Thought Content: Thought content normal.        Judgment: Judgment normal.      No  results found for any visits on 05/05/23.    The 10-year ASCVD risk score (Arnett DK, et al., 2019) is: 13.6%    Assessment & Plan:   Problem List Items Addressed This Visit       Active Problems   Allergy    Allergy/?Asthma Increased use of Albuterol inhaler (2-3 times/week) over the past 3 weeks. Currently on Montelukast daily and Flonase nasal spray. -Refill Flovent inhaler for scheduled use morning and night to prevent asthma flares.      Relevant Medications   fluticasone (FLOVENT HFA) 220 MCG/ACT inhaler   Hyperlipidemia    Medication management: rosuvastatin  Lifestyle  factors for lowering cholesterol include: Diet therapy - heart-healthy diet rich in fruits, veggies, fiber-rich whole grains, lean meats, chicken, fish (at least twice a week), fat-free or 1% dairy products; foods low in saturated/trans fats, cholesterol, sodium, and sugar. Mediterranean diet has shown to be very heart healthy. Regular exercise - recommend at least 30 minutes a day, 5 times per week Weight management  Repeat CMP and lipid panel today       GAD (generalized anxiety disorder)    Increased stress regarding life events No SI/HI Requesting Xanax refill PDMP reviewed. UDS/Contract today       Relevant Medications   ALPRAZolam (XANAX) 1 MG tablet   Type 2 diabetes mellitus with obesity (HCC)    Non-compliant with glucose monitoring. -Order new glucose monitoring kit. -Continue Metformin 1000mg  twice daily. -Check labs today      Relevant Medications   Blood Glucose Monitoring Suppl DEVI   Glucose Blood (BLOOD GLUCOSE TEST STRIPS) STRP   Lancet Device MISC   Lancets Misc. MISC   Other Relevant Orders   CBC with Differential/Platelet   Comprehensive metabolic panel   Hemoglobin A1c   Lipid panel   Hypertension    Blood pressure elevated today, but patient had not taken medication yet. -Advise patient to monitor blood pressure at home for 2 weeks and send readings to Dr. Vedia Coffer for possible medication adjustment. Unable to come in for nurse visit recheck as she will be staying in [place] for awhile. -Continue Propranolol 60mg  twice daily.      Relevant Orders   Comprehensive metabolic panel   TSH   Lumbar radiculopathy    Refilling Norco and gabapentin Continue supportive measures PDMP reviewed. UDS/Contract today       Relevant Medications   ALPRAZolam (XANAX) 1 MG tablet   amphetamine-dextroamphetamine (ADDERALL XR) 30 MG 24 hr capsule   gabapentin (NEURONTIN) 600 MG tablet   HYDROcodone-acetaminophen (NORCO) 10-325 MG tablet   Adult ADHD    Doing well  with Adderall Refill today PDMP reviewed. UDS/Contract today       Relevant Medications   amphetamine-dextroamphetamine (ADDERALL XR) 30 MG 24 hr capsule   Back pain    See above      Relevant Medications   gabapentin (NEURONTIN) 600 MG tablet   HYDROcodone-acetaminophen (NORCO) 10-325 MG tablet   Other Visit Diagnoses     High risk medication use    -  Primary   Relevant Orders   DRUG MONITORING, PANEL 8 WITH CONFIRMATION, URINE   Screen for STD (sexually transmitted disease)       Relevant Orders   Cervicovaginal ancillary only   HIV Antibody (routine testing w rflx)   RPR   Vaginal itching       Relevant Orders   Cervicovaginal ancillary only      Vaginal Itching Itching and clear discharge  for 1 week. No dysuria or malodorous discharge. -Perform vaginal swab for yeast, BV, and STDs. -Requesting HIV/RPR screening also    Return in about 3 months (around 08/04/2023) for routine follow-up.    Clayborne Dana, NP

## 2023-05-05 NOTE — Assessment & Plan Note (Signed)
Refilling Norco and gabapentin Continue supportive measures PDMP reviewed. UDS/Contract today

## 2023-05-05 NOTE — Assessment & Plan Note (Signed)
Doing well with Adderall Refill today PDMP reviewed. UDS/Contract today

## 2023-05-06 LAB — CERVICOVAGINAL ANCILLARY ONLY
Bacterial Vaginitis (gardnerella): NEGATIVE
Candida Glabrata: NEGATIVE
Candida Vaginitis: POSITIVE — AB
Chlamydia: NEGATIVE
Comment: NEGATIVE
Comment: NEGATIVE
Comment: NEGATIVE
Comment: NEGATIVE
Comment: NEGATIVE
Comment: NORMAL
Neisseria Gonorrhea: NEGATIVE
Trichomonas: NEGATIVE

## 2023-05-07 ENCOUNTER — Other Ambulatory Visit: Payer: Self-pay | Admitting: Family Medicine

## 2023-05-07 LAB — DRUG MONITORING, PANEL 8 WITH CONFIRMATION, URINE
6 Acetylmorphine: NEGATIVE ng/mL (ref <10)
Alcohol Metabolites: NEGATIVE ng/mL (ref <500)
Alphahydroxyalprazolam: 132 ng/mL — ABNORMAL HIGH (ref <25)
Alphahydroxymidazolam: NEGATIVE ng/mL (ref <50)
Alphahydroxytriazolam: NEGATIVE ng/mL (ref <50)
Aminoclonazepam: NEGATIVE ng/mL (ref <25)
Amphetamines: NEGATIVE ng/mL (ref <500)
Benzodiazepines: POSITIVE ng/mL — AB (ref <100)
Buprenorphine, Urine: NEGATIVE ng/mL (ref <5)
Cocaine Metabolite: NEGATIVE ng/mL (ref <150)
Codeine: NEGATIVE ng/mL (ref <50)
Creatinine: 100 mg/dL (ref >=20.0)
Hydrocodone: 313 ng/mL — ABNORMAL HIGH (ref <50)
Hydromorphone: 131 ng/mL — ABNORMAL HIGH (ref <50)
Hydroxyethylflurazepam: NEGATIVE ng/mL (ref <50)
Lorazepam: NEGATIVE ng/mL (ref <50)
MDMA: NEGATIVE ng/mL (ref <500)
Marijuana Metabolite: NEGATIVE ng/mL (ref <20)
Morphine: NEGATIVE ng/mL (ref <50)
Nordiazepam: NEGATIVE ng/mL (ref <50)
Norhydrocodone: 453 ng/mL — ABNORMAL HIGH (ref <50)
Opiates: POSITIVE ng/mL — AB (ref <100)
Oxazepam: NEGATIVE ng/mL (ref <50)
Oxidant: NEGATIVE ug/mL (ref <200)
Oxycodone: NEGATIVE ng/mL (ref <100)
Temazepam: NEGATIVE ng/mL (ref <50)
pH: 6 (ref 4.5–9.0)

## 2023-05-07 LAB — DM TEMPLATE

## 2023-05-07 LAB — RPR: RPR Ser Ql: NONREACTIVE

## 2023-05-07 LAB — HIV ANTIBODY (ROUTINE TESTING W REFLEX): HIV 1&2 Ab, 4th Generation: NONREACTIVE

## 2023-05-07 MED ORDER — FLUCONAZOLE 150 MG PO TABS
150.0000 mg | ORAL_TABLET | Freq: Every day | ORAL | 0 refills | Status: DC
Start: 2023-05-07 — End: 2023-06-25

## 2023-05-07 NOTE — Addendum Note (Signed)
Addended by: Hyman Hopes B on: 05/07/2023 12:46 PM   Modules accepted: Orders

## 2023-05-07 NOTE — Progress Notes (Signed)
Alk phos continues to be high. Other liver enzymes are normal. Typically we would want to repeat this fasting and add isoenzymes and GGT testing, but I know you will be gone for awhile. Let us know when you are back in the area and we can recheck this. This could just be your baseline.   A1c has worsened and is now 9.9%. It's time to add another medication. We could try for a GLP-1 injectable like Ozempic, Mounjaro, etc or if you prefer a pill we have other options as well. Lifestyle measures are still critical. Let me know your preference on medication or if you would like to schedule a video visit to discuss options, we can do that!  Vaginal swab is positive for a yeast infection. Sending in Diflucan for you.

## 2023-05-12 ENCOUNTER — Ambulatory Visit: Payer: Self-pay | Admitting: Family Medicine

## 2023-05-15 ENCOUNTER — Telehealth (INDEPENDENT_AMBULATORY_CARE_PROVIDER_SITE_OTHER): Payer: Self-pay | Admitting: Family Medicine

## 2023-05-15 DIAGNOSIS — Z91199 Patient's noncompliance with other medical treatment and regimen due to unspecified reason: Secondary | ICD-10-CM

## 2023-05-15 NOTE — Progress Notes (Signed)
No show.  Multiple links sent.  Did not answer phone call.

## 2023-05-18 ENCOUNTER — Telehealth (INDEPENDENT_AMBULATORY_CARE_PROVIDER_SITE_OTHER): Payer: Self-pay | Admitting: Family Medicine

## 2023-05-18 DIAGNOSIS — Z91199 Patient's noncompliance with other medical treatment and regimen due to unspecified reason: Secondary | ICD-10-CM

## 2023-05-18 NOTE — Progress Notes (Signed)
CMA tried to call patient twice with no answer and no voicemail option Text and email links sent at 1:54  Provider attempted phone call. No answer and no voicemail option. Attempted alternate contact # (mother) with no answer and no voicemail option

## 2023-05-21 ENCOUNTER — Telehealth: Payer: Self-pay | Admitting: Family Medicine

## 2023-05-21 NOTE — Telephone Encounter (Signed)
Pt called and had some questions regarding medications. All CMAs were unavailable at the time and advised a message would be sent back to reach back out to her.

## 2023-05-21 NOTE — Telephone Encounter (Signed)
Called pt back to follow up and Ladona Ridgel refused to see pt VV anymore due to 2 no show VV. Ladona Ridgel stated she would see pt  in the office. Pt ask to spoke with Selena Batten and was advised to call tomorrow.

## 2023-06-24 ENCOUNTER — Other Ambulatory Visit: Payer: Self-pay | Admitting: Family Medicine

## 2023-06-24 DIAGNOSIS — F330 Major depressive disorder, recurrent, mild: Secondary | ICD-10-CM

## 2023-06-25 ENCOUNTER — Other Ambulatory Visit: Payer: Self-pay | Admitting: Family Medicine

## 2023-06-25 ENCOUNTER — Telehealth: Payer: Self-pay | Admitting: Family Medicine

## 2023-06-25 DIAGNOSIS — M5416 Radiculopathy, lumbar region: Secondary | ICD-10-CM

## 2023-06-25 DIAGNOSIS — F909 Attention-deficit hyperactivity disorder, unspecified type: Secondary | ICD-10-CM

## 2023-06-25 DIAGNOSIS — N898 Other specified noninflammatory disorders of vagina: Secondary | ICD-10-CM

## 2023-06-25 DIAGNOSIS — F411 Generalized anxiety disorder: Secondary | ICD-10-CM

## 2023-06-25 DIAGNOSIS — E1169 Type 2 diabetes mellitus with other specified complication: Secondary | ICD-10-CM

## 2023-06-25 DIAGNOSIS — M5441 Lumbago with sciatica, right side: Secondary | ICD-10-CM

## 2023-06-25 NOTE — Telephone Encounter (Signed)
Pt states she discussed with Ladona Ridgel about getting on a diabetes rx. Stated she would like the pill form vs the shots.   [facility] #[id number] [place], [place] - [address] AT [facility] & [address], [place] [address] Phone: [phone]  Fax: [phone]

## 2023-06-25 NOTE — Telephone Encounter (Signed)
From lab results on 05/05/2023:  A1c has worsened and is now 9.9%. It's time to add another medication. We could try for a GLP-1 injectable like Ozempic, Mounjaro, etc or if you prefer a pill we have other options as well. Lifestyle measures are still critical. Let me know your preference on medication or if you would like to schedule a video visit to discuss options, we can do that!   Patient opted for appt to discuss virtually. No showed virtual appt twice. Please advise.

## 2023-06-26 ENCOUNTER — Other Ambulatory Visit: Payer: Self-pay | Admitting: Neurology

## 2023-06-26 MED ORDER — HYDROCODONE-ACETAMINOPHEN 10-325 MG PO TABS
1.0000 | ORAL_TABLET | Freq: Three times a day (TID) | ORAL | 0 refills | Status: DC | PRN
Start: 2023-06-26 — End: 2023-08-07

## 2023-06-26 MED ORDER — AMPHETAMINE-DEXTROAMPHET ER 30 MG PO CP24: 60.0000 mg | ORAL_CAPSULE | ORAL | 0 refills | Status: DC

## 2023-06-26 MED ORDER — ZOLPIDEM TARTRATE 10 MG PO TABS: 10.0000 mg | ORAL_TABLET | Freq: Every evening | ORAL | 2 refills | Status: AC | PRN

## 2023-06-26 MED ORDER — RYBELSUS 3 MG PO TABS: 3.0000 mg | ORAL_TABLET | Freq: Every day | ORAL | 3 refills | Status: DC

## 2023-06-26 MED ORDER — ALPRAZOLAM 1 MG PO TABS: 1.0000 mg | ORAL_TABLET | Freq: Two times a day (BID) | ORAL | 0 refills | Status: DC | PRN

## 2023-06-26 MED ORDER — FLUCONAZOLE 150 MG PO TABS: 150.0000 mg | ORAL_TABLET | Freq: Every day | ORAL | 0 refills | Status: DC

## 2023-06-26 NOTE — Telephone Encounter (Signed)
Patient moved from [place] to [place] and Ambien won't transfer. Can you please send? Pended.

## 2023-06-26 NOTE — Telephone Encounter (Signed)
Patient does not have insurance right now. Planning on getting in the next month. We discussed options. She would be interested in trying Trulicity if appropriate. She has been prescribed in the past but never tried it. Please advise.

## 2023-06-26 NOTE — Telephone Encounter (Signed)
Requesting: alprazolam 1mg , Adderall XR 30mg , and hydrocodone 10-325mg  Contract: 05/05/23 UDS: 05/05/23 Last Visit: 05/05/23 Next Visit: None Last Refill: Multiple controlled meds, see med list   Please Advise

## 2023-06-28 ENCOUNTER — Other Ambulatory Visit: Payer: Self-pay | Admitting: Family Medicine

## 2023-06-29 NOTE — Telephone Encounter (Signed)
Trulicity would be a fine option, but probably expensive. If she wants, I can reach out to Tammy to see what type of patient assistance things she can find to help out while not having insurance.

## 2023-06-29 NOTE — Telephone Encounter (Signed)
Patient had a lot of questions, I think speaking with Tammy about options would be a good idea.

## 2023-06-30 ENCOUNTER — Telehealth: Payer: Self-pay

## 2023-06-30 NOTE — Progress Notes (Signed)
   Care Guide Note  06/30/2023 Name: Nicole Ball MRN: [id number] DOB: 1976/07/06  Referred by: [name], MD Reason for referral : Care Coordination (Outreach to schedule with Pharm d )   Nicole Ball is a 47 y.o. year old female who is a primary care patient of [name], MD. [name] was referred to the pharmacist for assistance related to DM.    Successful contact was made with the patient to discuss pharmacy services including being ready for the pharmacist to call at least 5 minutes before the scheduled appointment time, to have medication bottles and any blood sugar or blood pressure readings ready for review. The patient agreed to meet with the pharmacist via with the pharmacist via telephone visit on (date/time).  07/20/2023 Penne Lash , RMA     [place]  [facility], [facility] Guide  Direct Dial: [phone]  Website: .[email or web address]

## 2023-07-01 NOTE — Telephone Encounter (Signed)
Sent in by Worthy Rancher, NP. Will close encounter.

## 2023-07-20 ENCOUNTER — Encounter: Payer: Self-pay | Admitting: Pharmacist

## 2023-07-20 ENCOUNTER — Telehealth: Payer: Self-pay | Admitting: Pharmacist

## 2023-07-20 ENCOUNTER — Ambulatory Visit (INDEPENDENT_AMBULATORY_CARE_PROVIDER_SITE_OTHER): Payer: Self-pay | Admitting: Pharmacist

## 2023-07-20 DIAGNOSIS — E669 Obesity, unspecified: Secondary | ICD-10-CM

## 2023-07-20 DIAGNOSIS — E1169 Type 2 diabetes mellitus with other specified complication: Secondary | ICD-10-CM

## 2023-07-20 DIAGNOSIS — Z7984 Long term (current) use of oral hypoglycemic drugs: Secondary | ICD-10-CM

## 2023-07-20 NOTE — Telephone Encounter (Signed)
Attempt was made to contact patient by phone today for follow up by Clinical Pharmacist regarding type 2 DM medications and assistance with cost..  Unable to reach patient. LM on VM with my contact number [phone] or main office number [phone]

## 2023-07-20 NOTE — Progress Notes (Signed)
07/20/2023 Name: Nicole Ball MRN: [id number] DOB: April 27, 1976  Chief Complaint  Patient presents with   Diabetes    Nicole Ball is a 47 y.o. year old female who presented for a telephone visit.   They were referred to the pharmacist by their PCP for assistance in managing diabetes and medication access.    Subjective:  Patient currently does not have insurance. She is due to re-enroll with a plan. She is working part time.   Medication Access/Adherence  Current Pharmacy:  [facility] #[id number] - [place], [place] - [address] AT [facility] & [address] [place] [address] Phone: [phone] Fax: [phone]   Patient reports affordability concerns with their medications: Yes  - currently does not have any insurance coverage.  Patient reports access/transportation concerns to their pharmacy: No  Patient reports adherence concerns with their medications:  No      Diabetes:  Current medications: Metformin 500mg  - take 2 tablets twice a day (last refill was for #360 on 12/30/2022)  Medications tried in the past: Mounjaro - she had a lot of nausea and gas especially at first; "a green pill", Trulicity - never started.   Patient would prefer to take a tablet  Current glucose readings: Last blood glucose checked 1 or 2 weeks ago. Was 230's   Patient denies hypoglycemic s/sx including no dizziness, shakiness, sweating. Patient reports hyperglycemic symptoms including -increased yeast infections, polyuria.   Current medication access support: none - does not current have insurance coverage but is planning to reapply for [facility] plan.   Objective:  Lab Results  Component Value Date   HGBA1C 9.9 (H) 05/05/2023    Lab Results  Component Value Date   CREATININE 0.84 05/05/2023   BUN 9 05/05/2023   NA 136 05/05/2023   K 3.8 05/05/2023   CL 102 05/05/2023   CO2 23 05/05/2023    Lab Results  Component Value Date   CHOL 163  05/05/2023   HDL 67.90 05/05/2023   LDLCALC 70 05/05/2023   LDLDIRECT 44.0 06/18/2021   TRIG 126.0 05/05/2023   CHOLHDL 2 05/05/2023    Medications Reviewed Today     Reviewed by Henrene Pastor, RPH-CPP (Pharmacist) on 07/20/23 at 1344  Med List Status: <None>   Medication Order Taking? Sig Documenting Provider Last Dose Status Informant  ACETAMINOPHEN-BUTALBITAL 50-325 MG TABS [id number] Yes Take 1 tablet by mouth 2 (two) times daily as needed. [name], MD Taking Active   acyclovir ointment (ZOVIRAX) 5 % [id number] Yes Apply 1 Application topically every 3 (three) hours. [name], MD Taking Active   albuterol (VENTOLIN HFA) 108 (90 Base) MCG/ACT inhaler [id number] Yes INHALE 2 PUFFS INTO THE LUNGS EVERY 6 HOURS AS NEEDED FOR WHEEZING OR SHORTNESS OF BREATH [name], MD Taking Active   ALPRAZolam Prudy Feeler) 1 MG tablet [id number] Yes Take 1 tablet (1 mg total) by mouth 2 (two) times daily as needed for anxiety. Worthy Rancher B, FNP Taking Active   amphetamine-dextroamphetamine (ADDERALL XR) 30 MG 24 hr capsule [id number] Yes Take 2 capsules (60 mg total) by mouth every morning. Worthy Rancher B, FNP Taking Active   azelastine (ASTELIN) 0.1 % nasal spray [id number] Yes USE 2 SPRAYS INTO BOTH NOSTRILS TWICE DAILY AS DIRECTED [name], MD Taking Active   baclofen (LIORESAL) 10 MG tablet [id number] Yes TAKE 1 TABLET(10 MG) BY MOUTH THREE TIMES DAILY  Patient taking differently: Take 10  mg by mouth 3 (three) times daily. As needed   [name], MD Taking Active   Blood Glucose Monitoring Suppl DEVI [id number] Yes 1 each by Does not apply route in the morning, at noon, and at bedtime. May substitute to any manufacturer covered by patient's insurance. Clayborne Dana, NP Taking Active   conjugated estrogens (PREMARIN) vaginal cream [id number] Yes Apply a small amount to affected area twice a week. [name], MD Taking Active   escitalopram (LEXAPRO) 20 MG tablet [id number]  Yes TAKE 1 TABLET(20 MG) BY MOUTH DAILY [name], MD Taking Active   fluconazole (DIFLUCAN) 150 MG tablet [id number] No Take 1 tablet (150 mg total) by mouth daily. May repeat in 3 days if needed.  Patient not taking: Reported on 07/20/2023   Eulis Foster, FNP Not Taking Active   fluticasone [facility]) 50 MCG/ACT nasal spray [id number] Yes SHAKE LIQUID AND USE 2 SPRAYS IN EACH NOSTRIL DAILY Saguier, Kateri Mc Taking Active   fluticasone (FLOVENT HFA) 220 MCG/ACT inhaler [id number] Yes Inhale 1 puff into the lungs in the morning and at bedtime. Clayborne Dana, NP Taking Active   gabapentin (NEURONTIN) 600 MG tablet [id number]  Take 1 tablet (600 mg total) by mouth 3 (three) times daily. Clayborne Dana, NP  Active   Ginger, Zingiber officinalis, (GINGER PO) [id number] Yes Take 1 tablet by mouth daily. [provider] Taking Active   HYDROcodone-acetaminophen (NORCO) 10-325 MG tablet [id number] Yes Take 1 tablet by mouth every 8 (eight) hours as needed. Worthy Rancher B, FNP Taking Active   hyoscyamine (LEVSIN SL) 0.125 MG SL tablet [id number] Yes Take 1 tablet (0.125 mg total) by mouth every 4 (four) hours as needed. [name], MD Taking Active   levocetirizine (XYZAL) 5 MG tablet [id number] Yes Take 1 tablet (5 mg total) by mouth in the morning and at bedtime. [name], MD Taking Active   losartan (COZAAR) 50 MG tablet [id number] Yes TAKE 1 TABLET BY MOUTH DAILY [name], MD Taking Active   metFORMIN (GLUCOPHAGE) 500 MG tablet [id number] Yes TAKE 2 TABLETS(1000 MG) BY MOUTH TWICE DAILY [name], MD Taking Active   montelukast (SINGULAIR) 10 MG tablet [id number] Yes TAKE 1 TABLET(10 MG) BY MOUTH AT BEDTIME [name], MD Taking Active   Multiple Vitamins-Minerals (WOMENS MULTI PO) [id number] Yes Take 1 tablet by mouth daily. [provider] Taking Active   mupirocin ointment (BACTROBAN) 2 % [id number] Yes APPLY TO NOSTRILS WITH A CLEAN Q-TIP EVERY NIGHT AT  BEDTIME Copland, Gwenlyn Found, MD Taking Active   ondansetron (ZOFRAN) 4 MG tablet [id number] Yes TAKE 1 TABLET(4 MG) BY MOUTH EVERY 8 HOURS AS NEEDED FOR NAUSEA OR VOMITING [name], MD Taking Active   pantoprazole (PROTONIX) 40 MG tablet [id number] Yes TAKE 1 TABLET(40 MG) BY MOUTH DAILY [name], MD Taking Active   Probiotic Product (PROBIOTIC PO) [id number] Yes Take 1 tablet by mouth daily. [provider] Taking Active   promethazine (PHENERGAN) 25 MG tablet [id number] Yes TAKE 1 TABLET(25 MG) BY MOUTH EVERY 8 HOURS AS NEEDED FOR NAUSEA OR VOMITING [name], MD Taking Active   propranolol (INDERAL) 60 MG tablet [id number] Yes Take 1 tablet (60 mg total) by mouth 2 (two) times daily. [name], MD Taking Active   rosuvastatin (CRESTOR) 5 MG tablet [id number] Yes TAKE 1 TABLET(5 MG) BY MOUTH 3 TIMES A WEEK [name], MD Taking Active  Semaglutide (RYBELSUS) 3 MG TABS [id number] No Take 1 tablet (3 mg total) by mouth daily.  Patient not taking: Reported on 07/20/2023   Clayborne Dana, NP Not Taking Active   sodium chloride (OCEAN) 0.65 % SOLN nasal spray [id number]  Place 1 spray into both nostrils daily. [provider]  Active   SUMAtriptan (IMITREX) 50 MG tablet [id number] Yes May repeat in 2 hours if headache persists or recurs. [name], MD Taking Active   triamcinolone cream (KENALOG) 0.1 % [id number]  Apply 1 Application topically 2 (two) times daily. [name], MD  Active   venlafaxine XR (EFFEXOR-XR) 75 MG 24 hr capsule [id number] Yes Take 1 capsule (75 mg total) by mouth daily with breakfast. [name], MD Taking Active   zolpidem (AMBIEN) 10 MG tablet [id number] Yes Take 1 tablet (10 mg total) by mouth at bedtime as needed for sleep. Eulis Foster, FNP Taking Active               Assessment/Plan:   Medication Access / Type 2 DM - uncontrolled due to not having insurance.  Provided patient link to the [facility]  application for Trulicity. Once she completes her portion will complete provider's part.    Provided list of medications that pt might be able to get at a lower dose thru the [facility] - either at [facility] to thru mail order.   $5 per month or $15 for 90 days:  Metformin 500mg , baclofen, losartan 50mg , rosuvastatin, pantoprazole 40mg , escitalopram 20mg    $10 per month or $30 for 90 days:  Levocetirizine 5mg , montelukast 10mg  and venlafaxine ER 75mg    $15 per month or $45 for 90 days:  Gabapentin 600mg , ondansetron 4mg  and propranolol 60mg     Provided contact information for with representatives that can assist with reviewing if she is eligible for [place] Medicaid.    Julianne Handler Adult Medicaid [email or web address] [phone]   Daria Pastures Adult Medicaid [email or web address] [phone]  Follow Up Plan: check back with patient regarding medication assistance program application in 2 to 3 weeks.   Henrene Pastor, PharmD Clinical Pharmacist [facility]

## 2023-08-01 ENCOUNTER — Other Ambulatory Visit: Payer: Self-pay | Admitting: Family Medicine

## 2023-08-03 MED ORDER — VENLAFAXINE HCL ER 75 MG PO CP24: 75.0000 mg | ORAL_CAPSULE | Freq: Every day | ORAL | 0 refills | Status: DC

## 2023-08-03 MED ORDER — PROPRANOLOL HCL 60 MG PO TABS: 60.0000 mg | ORAL_TABLET | Freq: Two times a day (BID) | ORAL | 0 refills | Status: DC

## 2023-08-07 ENCOUNTER — Other Ambulatory Visit: Payer: Self-pay

## 2023-08-07 ENCOUNTER — Other Ambulatory Visit: Payer: Self-pay | Admitting: Family Medicine

## 2023-08-07 ENCOUNTER — Telehealth: Payer: Self-pay

## 2023-08-07 DIAGNOSIS — F909 Attention-deficit hyperactivity disorder, unspecified type: Secondary | ICD-10-CM

## 2023-08-07 DIAGNOSIS — M5416 Radiculopathy, lumbar region: Secondary | ICD-10-CM

## 2023-08-07 DIAGNOSIS — F411 Generalized anxiety disorder: Secondary | ICD-10-CM

## 2023-08-07 DIAGNOSIS — M5441 Lumbago with sciatica, right side: Secondary | ICD-10-CM

## 2023-08-07 MED ORDER — HYDROCODONE-ACETAMINOPHEN 10-325 MG PO TABS: 1.0000 | ORAL_TABLET | Freq: Three times a day (TID) | ORAL | 0 refills | Status: DC | PRN

## 2023-08-07 MED ORDER — AMPHETAMINE-DEXTROAMPHET ER 30 MG PO CP24: 60.0000 mg | ORAL_CAPSULE | ORAL | 0 refills | Status: DC

## 2023-08-07 NOTE — Telephone Encounter (Signed)
Copied from CRM [id number]. Topic: Clinical - Medication Refill >> Aug 07, 2023  4:12 PM Sim Boast F wrote: Most Recent Primary Care Visit:  Provider: Henrene Pastor  Department: [facility]  Visit Type: TELEPHONE OFFICE VISIT  Date: 07/20/2023  Medication: ***  Has the patient contacted their pharmacy?  (Agent: If no, request that the patient contact the pharmacy for the refill. If patient does not wish to contact the pharmacy document the reason why and proceed with request.) (Agent: If yes, when and what did the pharmacy advise?)  Is this the correct pharmacy for this prescription?  If no, delete pharmacy and type the correct one.  This is the patient's preferred pharmacy:  [facility] #[id number] [facility] [place], [place] - [address] AT [facility] & [address] [place] [address] Phone: [phone] Fax: [phone]  [facility] #[id number] - [place], [place] - [address] AT [facility] OF [address] & [address] [place] [address] Phone: [phone] Fax: [phone]   Has the prescription been filled recently?   Is the patient out of the medication?   Has the patient been seen for an appointment in the last year OR does the patient have an upcoming appointment?   Can we respond through MyChart?   Agent: Please be advised that Rx refills may take up to 3 business days. We ask that you follow-up with your pharmacy.

## 2023-08-07 NOTE — Telephone Encounter (Signed)
Requesting: NORCO & Adderall Contract:06/02/23 UDS:05/05/23 Last Visit:05/05/23 Next Visit:n/a Last Refill:06/26/23  Please Advise        Copied from CRM [id number]. Topic: Clinical - Medication Refill >> Aug 07, 2023  4:12 PM Sim Boast F wrote: Most Recent Primary Care Visit:  Provider: Henrene Pastor  Department: [facility]  Visit Type: TELEPHONE OFFICE VISIT  Date: 07/20/2023  Medication:  amphetamine-dextroamphetamine (ADDERALL XR)   HYDROcodone-acetaminophen (NORCO) 10-325 MG tablet  gabapentin  ALPRAZolam    Has the patient contacted their pharmacy? No (Agent: If no, request that the patient contact the pharmacy for the refill. If patient does not wish to contact the pharmacy document the reason why and proceed with request.) (Agent: If yes, when and what did the pharmacy advise?)  Is this the correct pharmacy for this prescription? Yes If no, delete pharmacy and type the correct one.  This is the patient's preferred pharmacy:    [facility] #[id number] - [place], [place] - [address] AT [facility] OF [address] & [address] [place] [address] Phone: [phone] Fax: [phone]   Has the prescription been filled recently? Yes  Is the patient out of the medication? Yes  Has the patient been seen for an appointment in the last year OR does the patient have an upcoming appointment? Yes  Can we respond through MyChart? Yes  Agent: Please be advised that Rx refills may take up to 3 business days. We ask that you follow-up with your pharmacy.

## 2023-08-07 NOTE — Telephone Encounter (Signed)
PCP sent in controlled medication today , PCP stated pt would need a follow up appointment next 3 months

## 2023-08-10 ENCOUNTER — Other Ambulatory Visit (HOSPITAL_BASED_OUTPATIENT_CLINIC_OR_DEPARTMENT_OTHER): Payer: Self-pay

## 2023-08-10 ENCOUNTER — Encounter: Payer: Self-pay | Admitting: Emergency Medicine

## 2023-08-10 NOTE — Telephone Encounter (Signed)
Tried to reach the patient by phone. One of the numbers was a temporary number and didn't work. The other was a wrong number of someone else. A MyChart message was sent to patient to schedule her appointment with front desk staff

## 2023-08-28 ENCOUNTER — Other Ambulatory Visit: Payer: Self-pay | Admitting: Family Medicine

## 2023-09-04 ENCOUNTER — Other Ambulatory Visit: Payer: Self-pay | Admitting: Family Medicine

## 2023-09-04 DIAGNOSIS — F411 Generalized anxiety disorder: Secondary | ICD-10-CM

## 2023-09-04 NOTE — Telephone Encounter (Deleted)
Copied from CRM [id number]. Topic: Clinical - Medication Refill >> Sep 04, 2023  3:45 PM Fredrich Romans wrote: Most Recent Primary Care Visit:  Provider: Henrene Pastor  Department: [facility]  Visit Type: TELEPHONE OFFICE VISIT  Date: 07/20/2023  Medication:  ALPRAZolam Prudy Feeler) 1 MG tablet    Has the patient contacted their pharmacy? No (Agent: If no, request that the patient contact the pharmacy for the refill. If patient does not wish to contact the pharmacy document the reason why and proceed with request.) (Agent: If yes, when and what did the pharmacy advise?)  Is this the correct pharmacy for this prescription? No If no, delete pharmacy and type the correct one.  This is the patient's preferred pharmacy:  [facility] #[id number] [facility], [place] - [address] AT [facility] & [address] [place] [address] Phone: [phone] Fax: [phone]    Has the prescription been filled recently? No  Is the patient out of the medication? Yes  Has the patient been seen for an appointment in the last year OR does the patient have an upcoming appointment? Yes  Can we respond through MyChart? Yes  Agent: Please be advised that Rx refills may take up to 3 business days. We ask that you follow-up with your pharmacy.

## 2023-09-11 ENCOUNTER — Telehealth: Payer: Self-pay

## 2023-09-11 ENCOUNTER — Other Ambulatory Visit: Payer: Self-pay | Admitting: Family Medicine

## 2023-09-11 DIAGNOSIS — M5441 Lumbago with sciatica, right side: Secondary | ICD-10-CM

## 2023-09-11 DIAGNOSIS — M5416 Radiculopathy, lumbar region: Secondary | ICD-10-CM

## 2023-09-11 MED ORDER — HYDROCODONE-ACETAMINOPHEN 10-325 MG PO TABS: 1.0000 | ORAL_TABLET | Freq: Three times a day (TID) | ORAL | 0 refills | Status: DC | PRN

## 2023-09-11 NOTE — Telephone Encounter (Signed)
Requesting: hydrocodone 10-325mg   Contract:05/05/23 UDS:05/05/23 Last Visit: 05/05/23 w/ Ladona Ridgel Next Visit: 01/07/24 Last Refill: 08/07/23 #90 and 0RF    Please Advise

## 2023-09-11 NOTE — Telephone Encounter (Signed)
PA initiated via Covermymeds; KEY: [id number]. Awaiting determination.

## 2023-09-11 NOTE — Telephone Encounter (Signed)
PA approved.    Approved. This drug has been approved. Approved quantity: 90 units per 30 day(s). The drug has been approved from 09/11/2023 to 12/10/2023. Please call the pharmacy to process your prescription claim. Generic or biosimilar substitution may be required when available and preferred on the formulary.

## 2023-09-13 ENCOUNTER — Other Ambulatory Visit: Payer: Self-pay | Admitting: Family Medicine

## 2023-09-13 DIAGNOSIS — E782 Mixed hyperlipidemia: Secondary | ICD-10-CM

## 2023-09-13 DIAGNOSIS — I10 Essential (primary) hypertension: Secondary | ICD-10-CM

## 2023-09-13 DIAGNOSIS — I4711 Inappropriate sinus tachycardia, so stated: Secondary | ICD-10-CM

## 2023-09-14 ENCOUNTER — Telehealth: Payer: Self-pay

## 2023-09-14 NOTE — Telephone Encounter (Signed)
PA initiated via Covermymeds; KEY: [id number]. Awaiting determination.

## 2023-09-14 NOTE — Telephone Encounter (Signed)
PA approved.   Approved. This drug has been approved. Approved quantity: 30 units per 30 day(s). The drug has been approved from 09/14/2023 to 09/13/2024. Please call the pharmacy to process your prescription claim. Generic or biosimilar substitution may be required when available and preferred on the formulary.. Authorization Expiration Date: September 13, 2024.

## 2023-09-16 ENCOUNTER — Other Ambulatory Visit (HOSPITAL_COMMUNITY): Payer: Self-pay

## 2023-09-17 ENCOUNTER — Other Ambulatory Visit: Payer: Self-pay | Admitting: Family Medicine

## 2023-09-28 ENCOUNTER — Other Ambulatory Visit: Payer: Self-pay | Admitting: Family Medicine

## 2023-09-28 DIAGNOSIS — F330 Major depressive disorder, recurrent, mild: Secondary | ICD-10-CM

## 2023-10-06 ENCOUNTER — Other Ambulatory Visit (HOSPITAL_COMMUNITY): Payer: Self-pay

## 2023-10-17 ENCOUNTER — Other Ambulatory Visit: Payer: Self-pay | Admitting: Family Medicine

## 2023-10-17 DIAGNOSIS — M5416 Radiculopathy, lumbar region: Secondary | ICD-10-CM

## 2023-10-17 DIAGNOSIS — M5441 Lumbago with sciatica, right side: Secondary | ICD-10-CM

## 2023-10-19 MED ORDER — HYDROCODONE-ACETAMINOPHEN 10-325 MG PO TABS
1.0000 | ORAL_TABLET | Freq: Three times a day (TID) | ORAL | 0 refills | Status: DC | PRN
Start: 2023-10-19 — End: 2023-11-27

## 2023-10-19 NOTE — Telephone Encounter (Signed)
 Requesting: hydrocodone 10/325mg  Contract: Yes UDS: 05/04/24 Last Visit: 05/05/23 Next Visit: 01/07/2024 Last Refill: 09/10/22  Please Advise

## 2023-10-29 ENCOUNTER — Other Ambulatory Visit: Payer: Self-pay | Admitting: Family Medicine

## 2023-10-29 DIAGNOSIS — M5441 Lumbago with sciatica, right side: Secondary | ICD-10-CM

## 2023-10-29 DIAGNOSIS — I4711 Inappropriate sinus tachycardia, so stated: Secondary | ICD-10-CM

## 2023-10-29 DIAGNOSIS — E782 Mixed hyperlipidemia: Secondary | ICD-10-CM

## 2023-10-29 DIAGNOSIS — F411 Generalized anxiety disorder: Secondary | ICD-10-CM

## 2023-10-29 DIAGNOSIS — I10 Essential (primary) hypertension: Secondary | ICD-10-CM

## 2023-10-29 DIAGNOSIS — F909 Attention-deficit hyperactivity disorder, unspecified type: Secondary | ICD-10-CM

## 2023-10-29 MED ORDER — GABAPENTIN 600 MG PO TABS: 600.0000 mg | ORAL_TABLET | Freq: Three times a day (TID) | ORAL | 0 refills | Status: DC

## 2023-10-29 NOTE — Addendum Note (Signed)
 Addended by: Silvio Pate on: 10/29/2023 02:14 PM   Modules accepted: Orders

## 2023-10-29 NOTE — Telephone Encounter (Signed)
 Will not let me e-scribe. States controlled. Pharmacy in [place].

## 2023-10-29 NOTE — Telephone Encounter (Signed)
 Copied from CRM [id number]. Topic: Clinical - Medication Refill >> Oct 29, 2023 12:57 PM Truddie Crumble wrote: Most Recent Primary Care Visit:  Provider: Henrene Pastor  Department: [facility]  Visit Type: TELEPHONE OFFICE VISIT  Date: 07/20/2023  Medication: amphetamine-dextroamphetamine (ADDERALL XR) 30 MG 24 hr capsule, metformin, ALPRAZolam (XANAX) 1 MG tablet, pantoprazole (PROTONIX) 40 MG tablet, baclofen (LIORESAL) 10 MG tablet (patient would like to get back on the flexeril)  Has the patient contacted their pharmacy? Yes (Agent: If no, request that the patient contact the pharmacy for the refill. If patient does not wish to contact the pharmacy document the reason why and proceed with request.) (Agent: If yes, when and what did the pharmacy advise?)  Is this the correct pharmacy for this prescription? Yes If no, delete pharmacy and type the correct one.  This is the patient's preferred pharmacy:  [facility] #[id number] [facility], [place] - [address] AT [facility] & [address] [place] [address] Phone: [phone] Fax: [phone]  Has the prescription been filled recently? No  Is the patient out of the medication? Yes  Has the patient been seen for an appointment in the last year OR does the patient have an upcoming appointment? Yes  Can we respond through MyChart? Yes  Agent: Please be advised that Rx refills may take up to 3 business days. We ask that you follow-up with your pharmacy.

## 2023-10-30 MED ORDER — ALPRAZOLAM 1 MG PO TABS
1.0000 mg | ORAL_TABLET | Freq: Two times a day (BID) | ORAL | 0 refills | Status: DC | PRN
Start: 2023-10-30 — End: 2023-12-22

## 2023-10-30 MED ORDER — PANTOPRAZOLE SODIUM 40 MG PO TBEC: 40.0000 mg | DELAYED_RELEASE_TABLET | Freq: Every day | ORAL | 3 refills | Status: DC

## 2023-10-30 MED ORDER — METFORMIN HCL 500 MG PO TABS: 500.0000 mg | ORAL_TABLET | Freq: Every day | ORAL | 3 refills | Status: DC

## 2023-10-30 MED ORDER — AMPHETAMINE-DEXTROAMPHET ER 30 MG PO CP24
60.0000 mg | ORAL_CAPSULE | ORAL | 0 refills | Status: DC
Start: 2023-10-30 — End: 2024-01-05

## 2023-10-30 MED ORDER — BACLOFEN 10 MG PO TABS: 10.0000 mg | ORAL_TABLET | Freq: Three times a day (TID) | ORAL | 1 refills | Status: AC

## 2023-10-30 NOTE — Telephone Encounter (Signed)
 Requesting: Xanax 1 mg  Contract: 05/06/2023 UDS: 05/05/2023 Last Visit: 05/05/2023 Next Visit: 01/07/2024 Last Refill: 06/26/2023  Please Advise    Requesting: Adderall XR 30 mg Contract: 05/06/2023 UDS: 05/05/2023 Last Visit: 05/05/2023 Next Visit: 01/07/2024 Last Refill: 06/26/2023  Please Advise

## 2023-11-27 ENCOUNTER — Other Ambulatory Visit: Payer: Self-pay | Admitting: Family Medicine

## 2023-11-27 DIAGNOSIS — M5441 Lumbago with sciatica, right side: Secondary | ICD-10-CM

## 2023-11-27 DIAGNOSIS — M5416 Radiculopathy, lumbar region: Secondary | ICD-10-CM

## 2023-11-30 MED ORDER — GABAPENTIN 600 MG PO TABS: 600.0000 mg | ORAL_TABLET | Freq: Three times a day (TID) | ORAL | 0 refills | Status: DC

## 2023-11-30 MED ORDER — HYDROCODONE-ACETAMINOPHEN 10-325 MG PO TABS
1.0000 | ORAL_TABLET | Freq: Three times a day (TID) | ORAL | 0 refills | Status: DC | PRN
Start: 2023-11-30 — End: 2024-01-06

## 2023-11-30 NOTE — Telephone Encounter (Signed)
 Requesting: hydrocodone  apap 10-325 Contract: No UDS: 05/05/23 Last Visit: 07/20/23 Next Visit: 01/07/2024 Last Refill: 10/19/23  Please Advise

## 2023-12-22 ENCOUNTER — Other Ambulatory Visit: Payer: Self-pay | Admitting: Family Medicine

## 2023-12-22 DIAGNOSIS — T7840XD Allergy, unspecified, subsequent encounter: Secondary | ICD-10-CM

## 2023-12-22 DIAGNOSIS — F411 Generalized anxiety disorder: Secondary | ICD-10-CM

## 2023-12-22 DIAGNOSIS — N898 Other specified noninflammatory disorders of vagina: Secondary | ICD-10-CM

## 2023-12-22 NOTE — Telephone Encounter (Signed)
 Copied from CRM [id number]. Topic: Clinical - Medication Refill >> Dec 22, 2023  4:59 PM Kevelyn M wrote: Medication: ALPRAZolam  (XANAX ) 1 MG tablet, SUMAtriptan  (IMITREX ) 50 MG tablet, fluticasone  (FLONASE ) 50 MCG/ACT nasal spray, fluconazole  (DIFLUCAN ) 150 MG tablet, fluticasone  (FLOVENT  HFA) 220 MCG/ACT inhaler  Has the patient contacted their pharmacy? No (Agent: If no, request that the patient contact the pharmacy for the refill. If patient does not wish to contact the pharmacy document the reason why and proceed with request.) (Agent: If yes, when and what did the pharmacy advise?)  This is the patient's preferred pharmacy:  [facility] #[id number] [facility] [place] - [address] AT [address] [facility] & [address] [place] [address] Phone: [phone] Fax: [phone]  Is this the correct pharmacy for this prescription? Yes If no, delete pharmacy and type the correct one.   Has the prescription been filled recently? No  Is the patient out of the medication? No  Has the patient been seen for an appointment in the last year OR does the patient have an upcoming appointment? Yes  Can we respond through MyChart? Yes  Agent: Please be advised that Rx refills may take up to 3 business days. We ask that you follow-up with your pharmacy.

## 2023-12-22 NOTE — Addendum Note (Signed)
 Addended by: Eleazar Grief on: 12/22/2023 05:15 PM   Modules accepted: Orders

## 2023-12-23 ENCOUNTER — Encounter: Payer: Self-pay | Admitting: Family Medicine

## 2023-12-23 MED ORDER — SUMATRIPTAN SUCCINATE 50 MG PO TABS: ORAL_TABLET | ORAL | 2 refills | Status: AC

## 2023-12-23 MED ORDER — FLUTICASONE PROPIONATE HFA 220 MCG/ACT IN AERO: 1.0000 | INHALATION_SPRAY | Freq: Two times a day (BID) | RESPIRATORY_TRACT | 2 refills | Status: AC

## 2023-12-23 MED ORDER — FLUCONAZOLE 150 MG PO TABS: 150.0000 mg | ORAL_TABLET | Freq: Every day | ORAL | 0 refills | Status: AC

## 2023-12-23 MED ORDER — ALPRAZOLAM 1 MG PO TABS: 1.0000 mg | ORAL_TABLET | Freq: Two times a day (BID) | ORAL | 0 refills | Status: DC | PRN

## 2023-12-23 MED ORDER — FLUTICASONE PROPIONATE 50 MCG/ACT NA SUSP: 2.0000 | Freq: Every day | NASAL | 3 refills | Status: AC

## 2023-12-23 NOTE — Telephone Encounter (Signed)
 Requesting: alprazolam  1mg   Contract:05/05/23 UDS: 05/05/23 Last Visit: 05/05/23 Next Visit: 02/11/24 w/ Gwenevere Lent Last Refill: 10/30/23 #60 and 0RF   Please Advise

## 2023-12-23 NOTE — Addendum Note (Signed)
 Addended by: Demarco Bacci D on: 12/23/2023 09:33 AM   Modules accepted: Orders

## 2023-12-31 ENCOUNTER — Other Ambulatory Visit: Payer: Self-pay | Admitting: Family Medicine

## 2023-12-31 DIAGNOSIS — F330 Major depressive disorder, recurrent, mild: Secondary | ICD-10-CM

## 2024-01-04 ENCOUNTER — Encounter: Payer: Self-pay | Admitting: Family Medicine

## 2024-01-05 ENCOUNTER — Telehealth: Payer: Self-pay

## 2024-01-05 ENCOUNTER — Other Ambulatory Visit: Payer: Self-pay | Admitting: Family

## 2024-01-05 ENCOUNTER — Other Ambulatory Visit: Payer: Self-pay | Admitting: Family Medicine

## 2024-01-05 DIAGNOSIS — F909 Attention-deficit hyperactivity disorder, unspecified type: Secondary | ICD-10-CM

## 2024-01-05 MED ORDER — AMPHETAMINE-DEXTROAMPHET ER 30 MG PO CP24: 60.0000 mg | ORAL_CAPSULE | ORAL | 0 refills | Status: DC

## 2024-01-05 MED ORDER — BUTALBITAL-ACETAMINOPHEN 50-325 MG PO TABS: 1.0000 | ORAL_TABLET | Freq: Two times a day (BID) | ORAL | 0 refills | Status: AC | PRN

## 2024-01-05 NOTE — Telephone Encounter (Signed)
 Initial Comment Caller states she needs to get a refill. She has headaches. Translation No Disp. Time [facility] Time) Disposition Final User 01/01/2024 9:12:59 PM Attempt made - no message left Cue, RN, [facility] 01/01/2024 9:26:02 PM FINAL ATTEMPT MADE - no message left Yes Cue, RN, [facility] Final Disposition 01/01/2024 9:26:02 PM FINAL ATTEMPT MADE - no message left Yes Cue, RN, [facility] Comments User: Alison Applebaum, RN Date/Time [facility] Time): 01/01/2024 9:26:55 PM 2 attempts made to reach patient but were unsuccessful. Unable to leave voice mail for patient as mailbox is full

## 2024-01-06 ENCOUNTER — Other Ambulatory Visit: Payer: Self-pay | Admitting: Family Medicine

## 2024-01-06 ENCOUNTER — Telehealth: Payer: Self-pay

## 2024-01-06 DIAGNOSIS — M5441 Lumbago with sciatica, right side: Secondary | ICD-10-CM

## 2024-01-06 DIAGNOSIS — M5416 Radiculopathy, lumbar region: Secondary | ICD-10-CM

## 2024-01-06 MED ORDER — HYDROCODONE-ACETAMINOPHEN 10-325 MG PO TABS: 1.0000 | ORAL_TABLET | Freq: Three times a day (TID) | ORAL | 0 refills | Status: DC | PRN

## 2024-01-06 NOTE — Telephone Encounter (Signed)
 Patient was advised that Adderall  and acetaminophen  - butalbital  50-325 MG was send into pharmacy on yesterday, 12/11/23.

## 2024-01-06 NOTE — Telephone Encounter (Signed)
 Copied from CRM [id number]. Topic: Clinical - Medication Question >> Jan 06, 2024  2:08 PM Nicole Ball wrote: Reason for CRM: Patient would like to have an update on Dr. Rodrick Clapper stating she will send prescription Norco to pharmacy

## 2024-01-07 ENCOUNTER — Ambulatory Visit: Payer: Self-pay | Admitting: Family Medicine

## 2024-01-20 ENCOUNTER — Other Ambulatory Visit: Payer: Self-pay | Admitting: Family Medicine

## 2024-01-20 DIAGNOSIS — I4711 Inappropriate sinus tachycardia, so stated: Secondary | ICD-10-CM

## 2024-01-20 DIAGNOSIS — I10 Essential (primary) hypertension: Secondary | ICD-10-CM

## 2024-01-20 DIAGNOSIS — E782 Mixed hyperlipidemia: Secondary | ICD-10-CM

## 2024-02-02 ENCOUNTER — Other Ambulatory Visit: Payer: Self-pay | Admitting: Family Medicine

## 2024-02-03 ENCOUNTER — Other Ambulatory Visit: Payer: Self-pay | Admitting: Family Medicine

## 2024-02-09 NOTE — Progress Notes (Unsigned)
 Subjective:     Patient ID: Nicole Ball, female    DOB: 05-15-1976, 48 y.o.   MRN: [id number]  No chief complaint on file.   HPI  Discussed the use of AI scribe software for clinical note transcription with the patient, who gave verbal consent to proceed.  History of Present Illness              Health Maintenance Due  Topic Date Due   Diabetic kidney evaluation - Urine ACR  Never done   Hepatitis B Vaccines (1 of 3 - 19+ 3-dose series) Never done   OPHTHALMOLOGY EXAM  02/08/2017   Colonoscopy  Never done   COVID-19 Vaccine (3 - Pfizer risk series) 02/13/2020   FOOT EXAM  03/28/2022   Cervical Cancer Screening (HPV/Pap Cotest)  07/14/2022   DTaP/Tdap/Td (2 - Td or Tdap) 10/15/2023   HEMOGLOBIN A1C  11/02/2023    Past Medical History:  Diagnosis Date   Abnormal serum level of alkaline phosphatase 05/21/2014   Acute low back pain 12/19/2010   ALLERGIC RHINITIS 10/16/2010   Anxiety and depression 06/07/2011   Atrophic vaginitis 08/17/2016   Cervical cancer screening 01/10/2014   Menarche at 8 Regular and moderate flow, became irregular until OCPs  history of abnormal pap in past, repeat was normal Last pap roughly 3 years ago G0P0, s/p No history of abnormal MGM, no h/o MGM  No concerns today no gyn surgeries LMP May 2013   CTS (carpal tunnel syndrome) 01/15/2014   Diabetes mellitus type 2 in obese 10/30/2011   Dry eyes 08/17/2016   Early menopause    Essential hypertension    HIATAL HERNIA WITH REFLUX, HX OF 10/16/2010   Hyperlipidemia 04/21/2011   Inappropriate sinus tachycardia (HCC)    INSOMNIA 10/16/2010   Knee pain, left 03/11/2012   Migraine 03/07/2013   Morbid obesity (HCC)    Palpitations 01/28/2015   Reflux 04/22/2012   Sleep apnea 06/07/2011   Tobacco use disorder 02/10/2017   Urinary urgency 08/17/2016   UTI'S, HX OF 10/16/2010    Past Surgical History:  Procedure Laterality Date   CHOLECYSTECTOMY      Family History  Problem Relation Age of Onset   Lupus  Mother    Lung disease Mother    Cancer Father        cigarette, alcohol HEENT squamous cell   Alcohol abuse Father    Thyroid  disease Sister    Asthma Sister    Allergies Brother    Alcohol abuse Maternal Aunt    Alcohol abuse Maternal Uncle    Arthritis Maternal Grandmother    Scleroderma Maternal Grandmother    Glaucoma Maternal Grandmother    Cataracts Maternal Grandmother    Cancer Maternal Grandfather        prostate   Coronary artery disease Maternal Grandfather        s/p angioplasty   Diabetes Maternal Grandfather        Type 2   Cancer Paternal Grandmother        Breast ca- dx in 50's   Thyroid  disease Paternal Grandmother    Breast cancer Paternal Grandmother    Diabetes Paternal Grandfather    Arthritis Other     Social History   Socioeconomic History   Marital status: Divorced    Spouse name: Not on file   Number of children: Not on file   Years of education: Not on file   Highest education level: Not on file  Occupational History  Not on file  Tobacco Use   Smoking status: Some Days    Current packs/day: 0.10    Types: Cigars, Cigarettes   Smokeless tobacco: Never   Tobacco comments:    1 black and milds  Vaping Use   Vaping status: Never Used  Substance and Sexual Activity   Alcohol use: Yes    Comment: twice/year   Drug use: No   Sexual activity: Not on file  Other Topics Concern   Not on file  Social History Narrative   Married lives w/ husband, works at [employer]   Social Drivers of Corporate investment banker Strain: Not on file  Food Insecurity: Not on file  Transportation Needs: Not on file  Physical Activity: Not on file  Stress: Not on file  Social Connections: Unknown (12/24/2021)   Received [facility]   Social Network    Social Network: Not on file  Intimate Partner Violence: Unknown (11/15/2021)   Received from [facility]   HITS    Physically Hurt: Not on file    Insult or Talk Down To: Not on file     Threaten Physical Harm: Not on file    Scream or Curse: Not on file    Outpatient Medications Prior to Visit  Medication Sig Dispense Refill   ACETAMINOPHEN -BUTALBITAL  50-325 MG TABS Take 1 tablet by mouth 2 (two) times daily as needed. 60 tablet 0   acyclovir  ointment (ZOVIRAX ) 5 % Apply 1 Application topically every 3 (three) hours. 30 g 1   albuterol  (VENTOLIN  HFA) 108 (90 Base) MCG/ACT inhaler INHALE 2 PUFFS INTO THE LUNGS EVERY 6 HOURS AS NEEDED FOR WHEEZING OR SHORTNESS OF BREATH 8.5 g 1   ALPRAZolam  (XANAX ) 1 MG tablet Take 1 tablet (1 mg total) by mouth 2 (two) times daily as needed for anxiety. 60 tablet 0   amphetamine -dextroamphetamine  (ADDERALL  XR) 30 MG 24 hr capsule Take 2 capsules (60 mg total) by mouth every morning. 60 capsule 0   azelastine  (ASTELIN ) 0.1 % nasal spray USE 2 SPRAYS INTO BOTH NOSTRILS TWICE DAILY AS DIRECTED 30 mL 12   baclofen  (LIORESAL ) 10 MG tablet Take 1 tablet (10 mg total) by mouth 3 (three) times daily. TAKE 1 TABLET(10 MG) BY MOUTH THREE TIMES DAILY 30 tablet 1   Blood Glucose Monitoring Suppl DEVI 1 each by Does not apply route in the morning, at noon, and at bedtime. May substitute to any manufacturer covered by patient's insurance. 1 each 0   conjugated estrogens  (PREMARIN ) vaginal cream Apply a small amount to affected area twice a week. 30 g 1   escitalopram  (LEXAPRO ) 20 MG tablet Take 1 tablet (20 mg total) by mouth daily. 90 tablet 0   fluconazole  (DIFLUCAN ) 150 MG tablet Take 1 tablet (150 mg total) by mouth daily. May repeat in 3 days if needed. 2 tablet 0   fluticasone  (FLONASE ) 50 MCG/ACT nasal spray Place 2 sprays into both nostrils daily. 16 g 3   fluticasone  (FLOVENT  HFA) 220 MCG/ACT inhaler Inhale 1 puff into the lungs in the morning and at bedtime. 12 g 2   gabapentin  (NEURONTIN ) 600 MG tablet Take 1 tablet (600 mg total) by mouth 3 (three) times daily. 90 tablet 0   Ginger, Zingiber officinalis, (GINGER PO) Take 1 tablet by mouth daily.      HYDROcodone -acetaminophen  (NORCO) 10-325 MG tablet Take 1 tablet by mouth every 8 (eight) hours as needed. 90 tablet 0   hyoscyamine  (LEVSIN  SL) 0.125 MG  SL tablet Take 1 tablet (0.125 mg total) by mouth every 4 (four) hours as needed. 30 tablet 0   levocetirizine (XYZAL ) 5 MG tablet TAKE 1 TABLET(5 MG) BY MOUTH IN THE MORNING AND AT BEDTIME 60 tablet 5   losartan  (COZAAR ) 50 MG tablet Take 1 tablet (50 mg total) by mouth daily. 90 tablet 1   metFORMIN  (GLUCOPHAGE ) 500 MG tablet Take 1 tablet (500 mg total) by mouth daily with breakfast. 360 tablet 3   metFORMIN  (GLUCOPHAGE ) 500 MG tablet TAKE 2 TABLETS(1000 MG) BY MOUTH TWICE DAILY 360 tablet 0   montelukast  (SINGULAIR ) 10 MG tablet Take 1 tablet (10 mg total) by mouth at bedtime. 90 tablet 0   Multiple Vitamins-Minerals (WOMENS MULTI PO) Take 1 tablet by mouth daily.     mupirocin  ointment (BACTROBAN ) 2 % APPLY TO NOSTRILS WITH A CLEAN Q-TIP EVERY NIGHT AT BEDTIME 22 g 1   ondansetron  (ZOFRAN ) 4 MG tablet TAKE 1 TABLET(4 MG) BY MOUTH EVERY 8 HOURS AS NEEDED FOR NAUSEA OR VOMITING 40 tablet 1   pantoprazole  (PROTONIX ) 40 MG tablet Take 1 tablet (40 mg total) by mouth daily. 30 tablet 3   pantoprazole  (PROTONIX ) 40 MG tablet TAKE 1 TABLET(40 MG) BY MOUTH DAILY 90 tablet 0   Probiotic Product (PROBIOTIC PO) Take 1 tablet by mouth daily.     promethazine  (PHENERGAN ) 25 MG tablet TAKE 1 TABLET(25 MG) BY MOUTH EVERY 8 HOURS AS NEEDED FOR NAUSEA OR VOMITING 30 tablet 1   propranolol  (INDERAL ) 60 MG tablet Take 1 tablet (60 mg total) by mouth 2 (two) times daily. 180 tablet 0   rosuvastatin  (CRESTOR ) 5 MG tablet TAKE 1 TABLET(5 MG) BY MOUTH 3 TIMES A WEEK 36 tablet 0   Semaglutide  (RYBELSUS ) 3 MG TABS Take 1 tablet (3 mg total) by mouth daily. (Patient not taking: Reported on 07/20/2023) 30 tablet 3   sodium chloride  (OCEAN) 0.65 % SOLN nasal spray Place 1 spray into both nostrils daily.     SUMAtriptan  (IMITREX ) 50 MG tablet TAKE 1 TABLET BY MOUTH  EVERY 2 HOURS AS NEEDED FOR MIGRAINE, MAY REPEAT IN 2 HOURS IF HEADACHE PERSISTS OR RECURS 10 tablet 2   triamcinolone  cream (KENALOG ) 0.1 % APPLY TOPICALLY TO THE AFFECTED AREA TWICE DAILY 80 g 1   venlafaxine  XR (EFFEXOR -XR) 75 MG 24 hr capsule Take 1 capsule (75 mg total) by mouth daily with breakfast. 90 capsule 0   zolpidem  (AMBIEN ) 10 MG tablet Take 1 tablet (10 mg total) by mouth at bedtime as needed for sleep. 30 tablet 2   No facility-administered medications prior to visit.    No Known Allergies  ROS     Objective:    Physical Exam   LMP 12/10/2012  Wt Readings from Last 3 Encounters:  05/05/23 182 lb (82.6 kg)  12/24/21 180 lb 9.6 oz (81.9 kg)  06/18/21 179 lb 9.6 oz (81.5 kg)       Assessment & Plan:   Problem List Items Addressed This Visit   None   I am having Nicole Ball maintain her sodium chloride , (Ginger, Zingiber officinalis, (GINGER PO)), Probiotic Product (PROBIOTIC PO), mupirocin  ointment, hyoscyamine , albuterol , azelastine , conjugated estrogens , acyclovir  ointment, ondansetron , Blood Glucose Monitoring Suppl, Rybelsus , zolpidem , Multiple Vitamins-Minerals (WOMENS MULTI PO), losartan , levocetirizine, baclofen , metFORMIN , pantoprazole , pantoprazole , metFORMIN , triamcinolone  cream, promethazine , gabapentin , ALPRAZolam , fluconazole , fluticasone , fluticasone , SUMAtriptan , escitalopram , venlafaxine  XR, amphetamine -dextroamphetamine , ACETAMINOPHEN -BUTALBITAL , HYDROcodone -acetaminophen , rosuvastatin , propranolol , and montelukast .  No orders of the defined types were placed in this encounter.

## 2024-02-11 ENCOUNTER — Telehealth (INDEPENDENT_AMBULATORY_CARE_PROVIDER_SITE_OTHER): Payer: Self-pay | Admitting: Student

## 2024-02-11 ENCOUNTER — Ambulatory Visit: Payer: Self-pay | Admitting: Student

## 2024-02-11 VITALS — BP 166/114 | HR 95 | Temp 96.4°F

## 2024-02-11 DIAGNOSIS — E669 Obesity, unspecified: Secondary | ICD-10-CM

## 2024-02-11 DIAGNOSIS — M5441 Lumbago with sciatica, right side: Secondary | ICD-10-CM

## 2024-02-11 DIAGNOSIS — E1169 Type 2 diabetes mellitus with other specified complication: Secondary | ICD-10-CM

## 2024-02-11 DIAGNOSIS — I10 Essential (primary) hypertension: Secondary | ICD-10-CM

## 2024-02-11 DIAGNOSIS — Z79899 Other long term (current) drug therapy: Secondary | ICD-10-CM

## 2024-02-11 DIAGNOSIS — F330 Major depressive disorder, recurrent, mild: Secondary | ICD-10-CM

## 2024-02-11 DIAGNOSIS — I4711 Inappropriate sinus tachycardia, so stated: Secondary | ICD-10-CM

## 2024-02-11 DIAGNOSIS — E782 Mixed hyperlipidemia: Secondary | ICD-10-CM

## 2024-02-11 DIAGNOSIS — F909 Attention-deficit hyperactivity disorder, unspecified type: Secondary | ICD-10-CM

## 2024-02-11 MED ORDER — AZELASTINE HCL 0.1 % NA SOLN: 1.0000 | Freq: Two times a day (BID) | NASAL | 3 refills | Status: AC

## 2024-02-11 MED ORDER — ESCITALOPRAM OXALATE 20 MG PO TABS
20.0000 mg | ORAL_TABLET | Freq: Every day | ORAL | 0 refills | Status: AC
Start: 2024-02-11 — End: ?

## 2024-02-11 MED ORDER — ROSUVASTATIN CALCIUM 5 MG PO TABS: 5.0000 mg | ORAL_TABLET | ORAL | 0 refills | Status: AC

## 2024-02-11 MED ORDER — GABAPENTIN 600 MG PO TABS: 600.0000 mg | ORAL_TABLET | Freq: Three times a day (TID) | ORAL | 0 refills | Status: AC

## 2024-02-11 MED ORDER — PROPRANOLOL HCL 60 MG PO TABS: 60.0000 mg | ORAL_TABLET | Freq: Two times a day (BID) | ORAL | 0 refills | Status: AC

## 2024-02-11 MED ORDER — LOSARTAN POTASSIUM 50 MG PO TABS
50.0000 mg | ORAL_TABLET | Freq: Every day | ORAL | 1 refills | Status: AC
Start: 2024-02-11 — End: ?

## 2024-02-11 MED ORDER — VENLAFAXINE HCL ER 75 MG PO CP24: 75.0000 mg | ORAL_CAPSULE | Freq: Every day | ORAL | 0 refills | Status: AC

## 2024-02-11 MED ORDER — AMPHETAMINE-DEXTROAMPHET ER 30 MG PO CP24
60.0000 mg | ORAL_CAPSULE | ORAL | 0 refills | Status: AC
Start: 2024-02-11 — End: ?

## 2024-02-11 MED ORDER — PANTOPRAZOLE SODIUM 40 MG PO TBEC: 40.0000 mg | DELAYED_RELEASE_TABLET | Freq: Every day | ORAL | 3 refills | Status: AC

## 2024-02-11 MED ORDER — METFORMIN HCL ER 750 MG PO TB24: 750.0000 mg | ORAL_TABLET | Freq: Every day | ORAL | 1 refills | Status: AC

## 2024-02-11 MED ORDER — MONTELUKAST SODIUM 10 MG PO TABS: 10.0000 mg | ORAL_TABLET | Freq: Every day | ORAL | 0 refills | Status: AC

## 2024-02-11 NOTE — Assessment & Plan Note (Addendum)
 Pt on regimen of controlled substances, including Xanax , hydrocodone -acetaminophen , Ambien , Adderall , and gabapentin , for anxiety, pain, insomnia, and nerve pain. She expresses a need for refills on several of these medications.  She reports that [place]  is not her primary residence, however she was in [place]  today during video visit.  Discussed with patient to RTC to update urine drug screening and contracts.  Will discuss case with supervising physician. If Pt ready residence continues to be [place] , she will need to establish care locally in to ensure appropriate FU with the current medication regimen.

## 2024-02-11 NOTE — Assessment & Plan Note (Signed)
 Patient to RTC-update UDS and contract.

## 2024-02-11 NOTE — Assessment & Plan Note (Deleted)
 Mood stable.  On venlafaxine  75 mg and fluoxetine  20 mg daily.

## 2024-02-11 NOTE — Progress Notes (Signed)
 Subjective:     Patient ID: Nicole Ball, female    DOB: [birth year], 48 y.o.   MRN: [id number]  No chief complaint on file.   HPI  Nicole Ball is a 48 year old patient for follow-up.  Hypertension Losartan  50 mg daily, Propanolol 60 mg tab BID  HLD Crestor  5 mg times per week  Diabetes: - Checking glucose at home:  - Medications: Metformin  2500 mg daily, semaglutide  (Rybelsus ) 3 mg daily--reports NOT taking** - Compliant with medications - Denies symptoms of hypoglycemia, polyuria, polydipsia, numbness extremities, foot ulcers/trauma, visual changes, wounds that are not healing, medication side effects  Eye exam: sees opthamology  Asthma Fluticasone  (Flovent ) inhaler 1 puff AM and at bedtime,  albuterol  (Ventolin  HFA) inhaler prn Flonase   GERD Pantoprazole  (Protonix ) 40 mg daily---symptoms?  Depression Lexapro  20 mg daily,Venlafaxine  (Effexor ) 75 mg   ADD Adderall  XR 60 mg daily.  Anxiety Alprazolam  1 mg tab twice daily as needed  Bilateral low back pain with right sided sciatica/ Chronic pain/Lumbar radiculopathy  Hydrocodone -acetaminophen  10-325 mg  TID prn Baclofen  (Lioresal ) 10 mg 3 times daily as needed Gabapentin  600 mg 3 times daily as needed  Allergies Azelastine  (Astelin ) 2 sprays twice daily, Xyzal  5 mg twice daily, Montelukasy (Singulair ) 10 MG      The patient is also on a regimen of controlled substances, including Xanax , hydrocodone -acetaminophen , Ambien , Adderall , and gabapentin , for anxiety, pain, insomnia, and nerve pain. She expresses a need for refills on several of these medications. The patient also takes Effexor  for stress management and denies any thoughts of self-harm.   Patient denies fever, chills, SOB, CP, palpitations, dyspnea, edema, HA, vision changes, N/V/D, abdominal pain, urinary symptoms, rash, weight changes, and recent illness or hospitalizations.    History of Present Illness              Health  Maintenance Due  Topic Date Due   Diabetic kidney evaluation - Urine ACR  Never done   Hepatitis B Vaccines (1 of 3 - 19+ 3-dose series) Never done   OPHTHALMOLOGY EXAM  02/08/2017   Colonoscopy  Never done   COVID-19 Vaccine (3 - Pfizer risk series) 02/13/2020   FOOT EXAM  03/28/2022   Cervical Cancer Screening (HPV/Pap Cotest)  07/14/2022   DTaP/Tdap/Td (2 - Td or Tdap) 10/15/2023   HEMOGLOBIN A1C  11/02/2023    Past Medical History:  Diagnosis Date   Abnormal serum level of alkaline phosphatase 05/21/2014   Acute low back pain 12/19/2010   ALLERGIC RHINITIS 10/16/2010   Anxiety and depression 06/07/2011   Atrophic vaginitis 08/17/2016   Cervical cancer screening 01/10/2014   Menarche at 8 Regular and moderate flow, became irregular until OCPs  history of abnormal pap in past, repeat was normal Last pap roughly 3 years ago G0P0, s/p No history of abnormal MGM, no h/o MGM  No concerns today no gyn surgeries LMP May 2013   CTS (carpal tunnel syndrome) 01/15/2014   Diabetes mellitus type 2 in obese 10/30/2011   Dry eyes 08/17/2016   Early menopause    Essential hypertension    HIATAL HERNIA WITH REFLUX, HX OF 10/16/2010   Hyperlipidemia 04/21/2011   Inappropriate sinus tachycardia (HCC)    INSOMNIA 10/16/2010   Knee pain, left 03/11/2012   Migraine 03/07/2013   Morbid obesity (HCC)    Palpitations 01/28/2015   Reflux 04/22/2012   Sleep apnea 06/07/2011   Tobacco use disorder 02/10/2017   Urinary urgency 08/17/2016   UTI'S, HX OF 10/16/2010  Past Surgical History:  Procedure Laterality Date   CHOLECYSTECTOMY      Family History  Problem Relation Age of Onset   Lupus Mother    Lung disease Mother    Cancer Father        cigarette, alcohol HEENT squamous cell   Alcohol abuse Father    Thyroid  disease Sister    Asthma Sister    Allergies Brother    Alcohol abuse Maternal Aunt    Alcohol abuse Maternal Uncle    Arthritis Maternal Grandmother    Scleroderma Maternal Grandmother     Glaucoma Maternal Grandmother    Cataracts Maternal Grandmother    Cancer Maternal Grandfather        prostate   Coronary artery disease Maternal Grandfather        s/p angioplasty   Diabetes Maternal Grandfather        Type 2   Cancer Paternal Grandmother        Breast ca- dx in 37's   Thyroid  disease Paternal Grandmother    Breast cancer Paternal Grandmother    Diabetes Paternal Grandfather    Arthritis Other     Social History   Socioeconomic History   Marital status: Divorced    Spouse name: Not on file   Number of children: Not on file   Years of education: Not on file   Highest education level: Not on file  Occupational History   Not on file  Tobacco Use   Smoking status: Some Days    Current packs/day: 0.10    Types: Cigars, Cigarettes   Smokeless tobacco: Never   Tobacco comments:    1 black and milds  Vaping Use   Vaping status: Never Used  Substance and Sexual Activity   Alcohol use: Yes    Comment: twice/year   Drug use: No   Sexual activity: Not on file  Other Topics Concern   Not on file  Social History Narrative   Married lives w/ husband, works at [employer]   Social Drivers of Corporate investment banker Strain: Not on file  Food Insecurity: Not on file  Transportation Needs: Not on file  Physical Activity: Not on file  Stress: Not on file  Social Connections: Unknown (12/24/2021)   Received from [facility]   Social Network    Social Network: Not on file  Intimate Partner Violence: Unknown (11/15/2021)   Received from [facility]   HITS    Physically Hurt: Not on file    Insult or Talk Down To: Not on file    Threaten Physical Harm: Not on file    Scream or Curse: Not on file    Outpatient Medications Prior to Visit  Medication Sig Dispense Refill   ACETAMINOPHEN -BUTALBITAL  50-325 MG TABS Take 1 tablet by mouth 2 (two) times daily as needed. 60 tablet 0   acyclovir  ointment (ZOVIRAX ) 5 % Apply 1 Application topically every 3  (three) hours. 30 g 1   albuterol  (VENTOLIN  HFA) 108 (90 Base) MCG/ACT inhaler INHALE 2 PUFFS INTO THE LUNGS EVERY 6 HOURS AS NEEDED FOR WHEEZING OR SHORTNESS OF BREATH 8.5 g 1   ALPRAZolam  (XANAX ) 1 MG tablet Take 1 tablet (1 mg total) by mouth 2 (two) times daily as needed for anxiety. 60 tablet 0   amphetamine -dextroamphetamine  (ADDERALL  XR) 30 MG 24 hr capsule Take 2 capsules (60 mg total) by mouth every morning. 60 capsule 0   azelastine  (ASTELIN ) 0.1 % nasal spray USE 2 SPRAYS  INTO BOTH NOSTRILS TWICE DAILY AS DIRECTED 30 mL 12   baclofen  (LIORESAL ) 10 MG tablet Take 1 tablet (10 mg total) by mouth 3 (three) times daily. TAKE 1 TABLET(10 MG) BY MOUTH THREE TIMES DAILY 30 tablet 1   Blood Glucose Monitoring Suppl DEVI 1 each by Does not apply route in the morning, at noon, and at bedtime. May substitute to any manufacturer covered by patient's insurance. 1 each 0   conjugated estrogens  (PREMARIN ) vaginal cream Apply a small amount to affected area twice a week. 30 g 1   escitalopram  (LEXAPRO ) 20 MG tablet Take 1 tablet (20 mg total) by mouth daily. 90 tablet 0   fluconazole  (DIFLUCAN ) 150 MG tablet Take 1 tablet (150 mg total) by mouth daily. May repeat in 3 days if needed. 2 tablet 0   fluticasone  (FLONASE ) 50 MCG/ACT nasal spray Place 2 sprays into both nostrils daily. 16 g 3   fluticasone  (FLOVENT  HFA) 220 MCG/ACT inhaler Inhale 1 puff into the lungs in the morning and at bedtime. 12 g 2   gabapentin  (NEURONTIN ) 600 MG tablet Take 1 tablet (600 mg total) by mouth 3 (three) times daily. 90 tablet 0   Ginger, Zingiber officinalis, (GINGER PO) Take 1 tablet by mouth daily.     HYDROcodone -acetaminophen  (NORCO) 10-325 MG tablet Take 1 tablet by mouth every 8 (eight) hours as needed. 90 tablet 0   hyoscyamine  (LEVSIN  SL) 0.125 MG SL tablet Take 1 tablet (0.125 mg total) by mouth every 4 (four) hours as needed. 30 tablet 0   levocetirizine (XYZAL ) 5 MG tablet TAKE 1 TABLET(5 MG) BY MOUTH IN THE  MORNING AND AT BEDTIME 60 tablet 5   losartan  (COZAAR ) 50 MG tablet Take 1 tablet (50 mg total) by mouth daily. 90 tablet 1   metFORMIN  (GLUCOPHAGE ) 500 MG tablet Take 1 tablet (500 mg total) by mouth daily with breakfast. 360 tablet 3   metFORMIN  (GLUCOPHAGE ) 500 MG tablet TAKE 2 TABLETS(1000 MG) BY MOUTH TWICE DAILY 360 tablet 0   montelukast  (SINGULAIR ) 10 MG tablet Take 1 tablet (10 mg total) by mouth at bedtime. 90 tablet 0   Multiple Vitamins-Minerals (WOMENS MULTI PO) Take 1 tablet by mouth daily.     mupirocin  ointment (BACTROBAN ) 2 % APPLY TO NOSTRILS WITH A CLEAN Q-TIP EVERY NIGHT AT BEDTIME 22 g 1   ondansetron  (ZOFRAN ) 4 MG tablet TAKE 1 TABLET(4 MG) BY MOUTH EVERY 8 HOURS AS NEEDED FOR NAUSEA OR VOMITING 40 tablet 1   pantoprazole  (PROTONIX ) 40 MG tablet Take 1 tablet (40 mg total) by mouth daily. 30 tablet 3   pantoprazole  (PROTONIX ) 40 MG tablet TAKE 1 TABLET(40 MG) BY MOUTH DAILY 90 tablet 0   Probiotic Product (PROBIOTIC PO) Take 1 tablet by mouth daily.     promethazine  (PHENERGAN ) 25 MG tablet TAKE 1 TABLET(25 MG) BY MOUTH EVERY 8 HOURS AS NEEDED FOR NAUSEA OR VOMITING 30 tablet 1   propranolol  (INDERAL ) 60 MG tablet Take 1 tablet (60 mg total) by mouth 2 (two) times daily. 180 tablet 0   rosuvastatin  (CRESTOR ) 5 MG tablet TAKE 1 TABLET(5 MG) BY MOUTH 3 TIMES A WEEK 36 tablet 0   Semaglutide  (RYBELSUS ) 3 MG TABS Take 1 tablet (3 mg total) by mouth daily. (Patient not taking: Reported on 07/20/2023) 30 tablet 3   sodium chloride  (OCEAN) 0.65 % SOLN nasal spray Place 1 spray into both nostrils daily.     SUMAtriptan  (IMITREX ) 50 MG tablet TAKE 1 TABLET BY MOUTH  EVERY 2 HOURS AS NEEDED FOR MIGRAINE, MAY REPEAT IN 2 HOURS IF HEADACHE PERSISTS OR RECURS 10 tablet 2   triamcinolone  cream (KENALOG ) 0.1 % APPLY TOPICALLY TO THE AFFECTED AREA TWICE DAILY 80 g 1   venlafaxine  XR (EFFEXOR -XR) 75 MG 24 hr capsule Take 1 capsule (75 mg total) by mouth daily with breakfast. 90 capsule 0    zolpidem  (AMBIEN ) 10 MG tablet Take 1 tablet (10 mg total) by mouth at bedtime as needed for sleep. 30 tablet 2   No facility-administered medications prior to visit.    No Known Allergies  ROS See HPI    Objective:    Physical Exam  General: No acute distress. Awake and conversant. +obese Eyes: Normal conjunctiva, anicteric. Round symmetric pupils.  ENT: Hearing grossly intact. No nasal discharge.  Neck: Neck is supple. No masses or thyromegaly.  Respiratory: CTAB. Respirations are non-labored. No wheezing.  Skin: Warm. No rashes or ulcers.  Psych: Alert and oriented. Cooperative, Appropriate mood and affect, Normal judgment.  CV: RRR. No murmur. No lower extremity edema.  MSK: Normal ambulation. No clubbing or cyanosis.  Neuro:  CN II-XII grossly normal.      LMP 12/10/2012  Wt Readings from Last 3 Encounters:  05/05/23 182 lb (82.6 kg)  12/24/21 180 lb 9.6 oz (81.9 kg)  06/18/21 179 lb 9.6 oz (81.5 kg)       Assessment & Plan:   Problem List Items Addressed This Visit   None     Allergy      Allergy/?Asthma Increased use of Albuterol  inhaler (2-3 times/week) over the past 3 weeks. Currently on Montelukast  daily and Flonase  nasal spray. -Refill Flovent  inhaler for scheduled use morning and night to prevent asthma flares.        Relevant Medications    fluticasone  (FLOVENT  HFA) 220 MCG/ACT inhaler    Hyperlipidemia      Medication management: rosuvastatin   Lifestyle factors for lowering cholesterol include: Diet therapy - heart-healthy diet rich in fruits, veggies, fiber-rich whole grains, lean meats, chicken, fish (at least twice a week), fat-free or 1% dairy products; foods low in saturated/trans fats, cholesterol, sodium, and sugar. Mediterranean diet has shown to be very heart healthy. Regular exercise - recommend at least 30 minutes a day, 5 times per week Weight management  Repeat CMP and lipid panel today          GAD (generalized anxiety disorder)       Increased stress regarding life events No SI/HI Requesting Xanax  refill PDMP reviewed. UDS/Contract today         Relevant Medications    ALPRAZolam  (XANAX ) 1 MG tablet    Type 2 diabetes mellitus with obesity (HCC)      Non-compliant with glucose monitoring. -Order new glucose monitoring kit. -Continue Metformin  1000mg  twice daily. -Check labs today        Relevant Medications    Blood Glucose Monitoring Suppl DEVI    Glucose Blood (BLOOD GLUCOSE TEST STRIPS) STRP    Lancet Device MISC    Lancets Misc. MISC    Other Relevant Orders    CBC with Differential/Platelet    Comprehensive metabolic panel    Hemoglobin A1c    Lipid panel    Hypertension      Blood pressure elevated today, but patient had not taken medication yet. -Advise patient to monitor blood pressure at home for 2 weeks and send readings to Dr. Netta for possible medication adjustment. Unable to come in for nurse visit  recheck as she will be staying in [place]  for awhile. -Continue Propranolol  60mg  twice daily.        Relevant Orders    Comprehensive metabolic panel    TSH    Lumbar radiculopathy      Refilling Norco and gabapentin  Continue supportive measures PDMP reviewed. UDS/Contract today         Relevant Medications    ALPRAZolam  (XANAX ) 1 MG tablet    amphetamine -dextroamphetamine  (ADDERALL  XR) 30 MG 24 hr capsule    gabapentin  (NEURONTIN ) 600 MG tablet    HYDROcodone -acetaminophen  (NORCO) 10-325 MG tablet    Adult ADHD   Discovered [place] was out of state, no charge for visit.   I am having Alahna Z. Brodman maintain her sodium chloride , (Ginger, Zingiber officinalis, (GINGER PO)), Probiotic Product (PROBIOTIC PO), mupirocin  ointment, hyoscyamine , albuterol , azelastine , conjugated estrogens , acyclovir  ointment, ondansetron , Blood Glucose Monitoring Suppl, Rybelsus , zolpidem , Multiple Vitamins-Minerals (WOMENS MULTI PO), losartan , levocetirizine, baclofen , metFORMIN , pantoprazole ,  pantoprazole , metFORMIN , triamcinolone  cream, promethazine , gabapentin , ALPRAZolam , fluconazole , fluticasone , fluticasone , SUMAtriptan , escitalopram , venlafaxine  XR, amphetamine -dextroamphetamine , ACETAMINOPHEN -BUTALBITAL , HYDROcodone -acetaminophen , rosuvastatin , propranolol , and montelukast .  No orders of the defined types were placed in this encounter.

## 2024-02-11 NOTE — Assessment & Plan Note (Signed)
 Mood stable.  On venlafaxine  75 mg and fluoxetine  20 mg daily.

## 2024-02-11 NOTE — Assessment & Plan Note (Signed)
 Stable on Adderall  XR.

## 2024-02-11 NOTE — Assessment & Plan Note (Deleted)
 Stable on Adderol XR.

## 2024-02-11 NOTE — Assessment & Plan Note (Deleted)
 Encourage heart healthy diet such as MIND or DASH diet, increase exercise, avoid trans fats, simple carbohydrates and processed foods, consider a krill or fish or flaxseed oil cap daily.  Update labs today.

## 2024-02-11 NOTE — Assessment & Plan Note (Addendum)
 Noncompliant with glucose monitoring.  Patient reports adverse GI SEson current metformin  dosage. Change current Rx to XR. Rx-metformin  750 mg XR.  Consider dose increase based on patient's tolerability of symptoms.

## 2024-02-11 NOTE — Assessment & Plan Note (Signed)
 Encourage heart healthy diet such as MIND or DASH diet, increase exercise, avoid trans fats, simple carbohydrates and processed foods, consider a krill or fish or flaxseed oil cap daily.  Update labs today.

## 2024-02-11 NOTE — Assessment & Plan Note (Signed)
 Patient reports elevated blood pressure today, however states that she did not take her losartan  today.  Encouraged patient to ensure medication compliance and monitor home blood pressure for goal of >140/90.  Return to clinic if blood pressures consistently above 140/90.  Encouraged heart healthy diet such as the DASH diet and exercise as tolerated.

## 2024-02-11 NOTE — Assessment & Plan Note (Deleted)
Non-compliant with glucose monitoring. -Order new glucose monitoring kit. -Continue Metformin 1000mg  twice daily. -Check labs today

## 2024-02-11 NOTE — Progress Notes (Signed)
 MyChart Video Visit    Virtual Visit via Video Note    Patient location: Home. Patient and provider in visit Provider location: Office  I discussed the limitations of evaluation and management by telemedicine and the availability of in person appointments. The patient expressed understanding and agreed to proceed.  Visit Date: 02/11/2024  Today's healthcare provider: Harlene Nicole Jolly, NP     Subjective:    Patient ID: Nicole Ball, female    DOB: 1975/08/30, 48 y.o.   MRN: [id number]  Chief Complaint  Patient presents with   Follow-up    HPI   Nicole Ball is a 48 year old patient for follow-up video visit chronic conditions. Currently resides in [place] . Reports increased level of stress, environmental stress. but otherwise feels well.  Hypertension Losartan  50 mg daily, Propanolol 60 mg tab BID BP- running elevated but not taking medication Reports BP today is 166/114- had not taken her Losartan  today   HLD Crestor  5 mg three times per week   Diabetes:  - Medications: Metformin  2000 mg daily- compliant -Semaglutide  (Rybelsus ) 3 mg daily-NOT taking - Compliant with medications - Denies symptoms of hypoglycemia, polyuria, polydipsia, numbness extremities, foot ulcers/trauma, visual changes, wounds that are not healing, medication side effects  C/O increased diarrhea with Metformin  would like to try XR    Asthma Fluticasone  (Flovent ) inhaler 1 puff AM and at bedtime,  albuterol  (Ventolin  HFA) inhaler prn  GERD Pantoprazole  (Protonix ) 40 mg daily. Compliant.  No symptoms of heartburn.   Depression Lexapro  20 mg daily,Venlafaxine  (Effexor ) 75 mg Complaint   ADD Adderall  XR 60 mg daily. Reports no adverse Ses.   Anxiety Alprazolam  1 mg tab twice daily as needed   Bilateral low back pain with right sided sciatica/ Chronic pain/Lumbar radiculopathy  Hydrocodone -acetaminophen  10-325 mg  TID prn Baclofen  (Lioresal ) 10 mg 3 times daily as  needed Gabapentin  600 mg 3 times daily as needed   Allergies Azelastine  (Astelin ) 2 sprays twice daily, Xyzal  5 mg twice daily, (Singulair ) 10 MG    Patient denies fever, chills, SOB, CP, palpitations, dyspnea, edema, HA, vision changes, N/V/D, abdominal pain, urinary symptoms, rash, weight changes, and recent illness or hospitalizations.     Past Medical History:  Diagnosis Date   Abnormal serum level of alkaline phosphatase 05/21/2014   Acute low back pain 12/19/2010   ALLERGIC RHINITIS 10/16/2010   Anxiety and depression 06/07/2011   Atrophic vaginitis 08/17/2016   Cervical cancer screening 01/10/2014   Menarche at 8 Regular and moderate flow, became irregular until OCPs  history of abnormal pap in past, repeat was normal Last pap roughly 3 years ago G0P0, s/p No history of abnormal MGM, no h/o MGM  No concerns today no gyn surgeries LMP May 2013   CTS (carpal tunnel syndrome) 01/15/2014   Diabetes mellitus type 2 in obese 10/30/2011   Dry eyes 08/17/2016   Early menopause    Essential hypertension    HIATAL HERNIA WITH REFLUX, HX OF 10/16/2010   Hyperlipidemia 04/21/2011   Inappropriate sinus tachycardia (HCC)    INSOMNIA 10/16/2010   Knee pain, left 03/11/2012   Migraine 03/07/2013   Morbid obesity (HCC)    Palpitations 01/28/2015   Reflux 04/22/2012   Sleep apnea 06/07/2011   Tobacco use disorder 02/10/2017   Urinary urgency 08/17/2016   UTI'S, HX OF 10/16/2010    Past Surgical History:  Procedure Laterality Date   CHOLECYSTECTOMY      Family History  Problem Relation Age of Onset  Lupus Mother    Lung disease Mother    Cancer Father        cigarette, alcohol HEENT squamous cell   Alcohol abuse Father    Thyroid  disease Sister    Asthma Sister    Allergies Brother    Alcohol abuse Maternal Aunt    Alcohol abuse Maternal Uncle    Arthritis Maternal Grandmother    Scleroderma Maternal Grandmother    Glaucoma Maternal Grandmother    Cataracts Maternal Grandmother    Cancer  Maternal Grandfather        prostate   Coronary artery disease Maternal Grandfather        s/p angioplasty   Diabetes Maternal Grandfather        Type 2   Cancer Paternal Grandmother        Breast ca- dx in 50's   Thyroid  disease Paternal Grandmother    Breast cancer Paternal Grandmother    Diabetes Paternal Grandfather    Arthritis Other     Social History   Socioeconomic History   Marital status: Divorced    Spouse name: Not on file   Number of children: Not on file   Years of education: Not on file   Highest education level: Not on file  Occupational History   Not on file  Tobacco Use   Smoking status: Some Days    Current packs/day: 0.10    Types: Cigars, Cigarettes   Smokeless tobacco: Never   Tobacco comments:    1 black and milds  Vaping Use   Vaping status: Never Used  Substance and Sexual Activity   Alcohol use: Yes    Comment: twice/year   Drug use: No   Sexual activity: Not on file  Other Topics Concern   Not on file  Social History Narrative   Married lives w/ husband, works at [employer]   Social Drivers of Corporate investment banker Strain: Not on file  Food Insecurity: Not on file  Transportation Needs: Not on file  Physical Activity: Not on file  Stress: Not on file  Social Connections: Unknown (12/24/2021)   [facility]   Social Network    Social Network: Not on file  Intimate Partner Violence: Unknown (11/15/2021)   Received from [facility]   HITS    Physically Hurt: Not on file    Insult or Talk Down To: Not on file    Threaten Physical Harm: Not on file    Scream or Curse: Not on file    Outpatient Medications Prior to Visit  Medication Sig Dispense Refill   ACETAMINOPHEN -BUTALBITAL  50-325 MG TABS Take 1 tablet by mouth 2 (two) times daily as needed. 60 tablet 0   acyclovir  ointment (ZOVIRAX ) 5 % Apply 1 Application topically every 3 (three) hours. 30 g 1   albuterol  (VENTOLIN  HFA) 108 (90 Base) MCG/ACT  inhaler INHALE 2 PUFFS INTO THE LUNGS EVERY 6 HOURS AS NEEDED FOR WHEEZING OR SHORTNESS OF BREATH 8.5 g 1   ALPRAZolam  (XANAX ) 1 MG tablet Take 1 tablet (1 mg total) by mouth 2 (two) times daily as needed for anxiety. 60 tablet 0   baclofen  (LIORESAL ) 10 MG tablet Take 1 tablet (10 mg total) by mouth 3 (three) times daily. TAKE 1 TABLET(10 MG) BY MOUTH THREE TIMES DAILY 30 tablet 1   Blood Glucose Monitoring Suppl DEVI 1 each by Does not apply route in the morning, at noon, and at bedtime. May substitute to any manufacturer covered  by patient's insurance. 1 each 0   conjugated estrogens  (PREMARIN ) vaginal cream Apply a small amount to affected area twice a week. 30 g 1   fluconazole  (DIFLUCAN ) 150 MG tablet Take 1 tablet (150 mg total) by mouth daily. May repeat in 3 days if needed. 2 tablet 0   fluticasone  (FLONASE ) 50 MCG/ACT nasal spray Place 2 sprays into both nostrils daily. 16 g 3   fluticasone  (FLOVENT  HFA) 220 MCG/ACT inhaler Inhale 1 puff into the lungs in the morning and at bedtime. 12 g 2   Ginger, Zingiber officinalis, (GINGER PO) Take 1 tablet by mouth daily.     HYDROcodone -acetaminophen  (NORCO) 10-325 MG tablet Take 1 tablet by mouth every 8 (eight) hours as needed. 90 tablet 0   hyoscyamine  (LEVSIN  SL) 0.125 MG SL tablet Take 1 tablet (0.125 mg total) by mouth every 4 (four) hours as needed. 30 tablet 0   levocetirizine (XYZAL ) 5 MG tablet TAKE 1 TABLET(5 MG) BY MOUTH IN THE MORNING AND AT BEDTIME 60 tablet 5   Multiple Vitamins-Minerals (WOMENS MULTI PO) Take 1 tablet by mouth daily.     mupirocin  ointment (BACTROBAN ) 2 % APPLY TO NOSTRILS WITH A CLEAN Q-TIP EVERY NIGHT AT BEDTIME 22 g 1   ondansetron  (ZOFRAN ) 4 MG tablet TAKE 1 TABLET(4 MG) BY MOUTH EVERY 8 HOURS AS NEEDED FOR NAUSEA OR VOMITING 40 tablet 1   pantoprazole  (PROTONIX ) 40 MG tablet TAKE 1 TABLET(40 MG) BY MOUTH DAILY 90 tablet 0   Probiotic Product (PROBIOTIC PO) Take 1 tablet by mouth daily.     promethazine   (PHENERGAN ) 25 MG tablet TAKE 1 TABLET(25 MG) BY MOUTH EVERY 8 HOURS AS NEEDED FOR NAUSEA OR VOMITING 30 tablet 1   sodium chloride  (OCEAN) 0.65 % SOLN nasal spray Place 1 spray into both nostrils daily.     SUMAtriptan  (IMITREX ) 50 MG tablet TAKE 1 TABLET BY MOUTH EVERY 2 HOURS AS NEEDED FOR MIGRAINE, MAY REPEAT IN 2 HOURS IF HEADACHE PERSISTS OR RECURS 10 tablet 2   triamcinolone  cream (KENALOG ) 0.1 % APPLY TOPICALLY TO THE AFFECTED AREA TWICE DAILY 80 g 1   zolpidem  (AMBIEN ) 10 MG tablet Take 1 tablet (10 mg total) by mouth at bedtime as needed for sleep. 30 tablet 2   amphetamine -dextroamphetamine  (ADDERALL  XR) 30 MG 24 hr capsule Take 2 capsules (60 mg total) by mouth every morning. 60 capsule 0   azelastine  (ASTELIN ) 0.1 % nasal spray USE 2 SPRAYS INTO BOTH NOSTRILS TWICE DAILY AS DIRECTED 30 mL 12   escitalopram  (LEXAPRO ) 20 MG tablet Take 1 tablet (20 mg total) by mouth daily. 90 tablet 0   gabapentin  (NEURONTIN ) 600 MG tablet Take 1 tablet (600 mg total) by mouth 3 (three) times daily. 90 tablet 0   losartan  (COZAAR ) 50 MG tablet Take 1 tablet (50 mg total) by mouth daily. 90 tablet 1   metFORMIN  (GLUCOPHAGE ) 500 MG tablet Take 1 tablet (500 mg total) by mouth daily with breakfast. 360 tablet 3   metFORMIN  (GLUCOPHAGE ) 500 MG tablet TAKE 2 TABLETS(1000 MG) BY MOUTH TWICE DAILY 360 tablet 0   montelukast  (SINGULAIR ) 10 MG tablet Take 1 tablet (10 mg total) by mouth at bedtime. 90 tablet 0   pantoprazole  (PROTONIX ) 40 MG tablet Take 1 tablet (40 mg total) by mouth daily. 30 tablet 3   propranolol  (INDERAL ) 60 MG tablet Take 1 tablet (60 mg total) by mouth 2 (two) times daily. 180 tablet 0   rosuvastatin  (CRESTOR ) 5 MG tablet TAKE  1 TABLET(5 MG) BY MOUTH 3 TIMES A WEEK 36 tablet 0   Semaglutide  (RYBELSUS ) 3 MG TABS Take 1 tablet (3 mg total) by mouth daily. (Patient not taking: Reported on 07/20/2023) 30 tablet 3   venlafaxine  XR (EFFEXOR -XR) 75 MG 24 hr capsule Take 1 capsule (75 mg total) by  mouth daily with breakfast. 90 capsule 0   No facility-administered medications prior to visit.    No Known Allergies  ROS See HPI    Objective:    Physical Exam  General: Patient appears well-nourished and in no acute distress. Alert and oriented to person and place. Engaged throughout visit  HEENT: Head atraumatic and normocephalic. No facial asymmetry.  Respiratory: Breathing unlabored. No cough, wheezing, or respiratory distress observed. Respiratory rate appears normal. Neurological: Patient alert and oriented. Speech coherent. No facial droop, slurred speech, or focal deficits observed. Psychiatric:  Patient cooperative.         Assessment & Plan:   Problem List Items Addressed This Visit     Adult ADHD   Stable on Adderall  XR.      Relevant Medications   amphetamine -dextroamphetamine  (ADDERALL  XR) 30 MG 24 hr capsule   Back pain   Patient to RTC-update UDS and contract.       Relevant Medications   gabapentin  (NEURONTIN ) 600 MG tablet   Essential hypertension    Patient reports elevated blood pressure today, however states that she did not take her losartan  today.  Encouraged patient to ensure medication compliance and monitor home blood pressure for goal of >140/90.  Return to clinic if blood pressures consistently above 140/90.  Encouraged heart healthy diet such as the DASH diet and exercise as tolerated.        Relevant Medications   losartan  (COZAAR ) 50 MG tablet   propranolol  (INDERAL ) 60 MG tablet   rosuvastatin  (CRESTOR ) 5 MG tablet (Start on 02/12/2024)   High risk medication use - Primary    Pt on regimen of controlled substances, including Xanax , hydrocodone -acetaminophen , Ambien , Adderall , and gabapentin , for anxiety, pain, insomnia, and nerve pain. She expresses a need for refills on several of these medications.  She reports that [place]  is not her primary residence, however she was in [place]  today during video visit.  Discussed with patient to RTC  to update urine drug screening and contracts.  Will discuss case with supervising physician. If Pt ready residence continues to be [place] , she will need to establish care locally in to ensure appropriate FU with the current medication regimen.      Hyperlipidemia   Encourage heart healthy diet such as MIND or DASH diet, increase exercise, avoid trans fats, simple carbohydrates and processed foods, consider a krill or fish or flaxseed oil cap daily.  Update labs today.        Relevant Medications   losartan  (COZAAR ) 50 MG tablet   propranolol  (INDERAL ) 60 MG tablet   rosuvastatin  (CRESTOR ) 5 MG tablet (Start on 02/12/2024)   Major depressive disorder, recurrent, mild (HCC)   Mood stable.  On venlafaxine  75 mg and fluoxetine  20 mg daily.        Relevant Medications   escitalopram  (LEXAPRO ) 20 MG tablet   venlafaxine  XR (EFFEXOR -XR) 75 MG 24 hr capsule   Type 2 diabetes mellitus with obesity (HCC)   Noncompliant with glucose monitoring.  Patient reports adverse GI SEson current metformin  dosage. Change current Rx to XR. Rx-metformin  750 mg XR.  Consider dose increase based on patient's tolerability of symptoms.  Relevant Medications   losartan  (COZAAR ) 50 MG tablet   rosuvastatin  (CRESTOR ) 5 MG tablet (Start on 02/12/2024)   metFORMIN  (GLUCOPHAGE -XR) 750 MG 24 hr tablet   Other Visit Diagnoses       Inappropriate sinus tachycardia (HCC)       Relevant Medications   losartan  (COZAAR ) 50 MG tablet   propranolol  (INDERAL ) 60 MG tablet   rosuvastatin  (CRESTOR ) 5 MG tablet (Start on 02/12/2024)        I have discontinued Chasady Z. Hert's Rybelsus , metFORMIN , and metFORMIN . I have also changed her rosuvastatin  and azelastine . Additionally, I am having her start on metFORMIN . Lastly, I am having her maintain her sodium chloride , (Ginger, Zingiber officinalis, (GINGER PO)), Probiotic Product (PROBIOTIC PO), mupirocin  ointment, hyoscyamine , albuterol , conjugated estrogens , acyclovir   ointment, ondansetron , Blood Glucose Monitoring Suppl, zolpidem , Multiple Vitamins-Minerals (WOMENS MULTI PO), levocetirizine, baclofen , pantoprazole , triamcinolone  cream, promethazine , ALPRAZolam , fluconazole , fluticasone , fluticasone , SUMAtriptan , ACETAMINOPHEN -BUTALBITAL , HYDROcodone -acetaminophen , escitalopram , gabapentin , losartan , montelukast , pantoprazole , propranolol , venlafaxine  XR, and amphetamine -dextroamphetamine .  Meds ordered this encounter  Medications   escitalopram  (LEXAPRO ) 20 MG tablet    Sig: Take 1 tablet (20 mg total) by mouth daily.    Dispense:  90 tablet    Refill:  0    Supervising Provider:   DOMENICA BLACKBIRD A [[id number]]   gabapentin  (NEURONTIN ) 600 MG tablet    Sig: Take 1 tablet (600 mg total) by mouth 3 (three) times daily.    Dispense:  90 tablet    Refill:  0    Supervising Provider:   DOMENICA BLACKBIRD A [[id number]]   losartan  (COZAAR ) 50 MG tablet    Sig: Take 1 tablet (50 mg total) by mouth daily.    Dispense:  90 tablet    Refill:  1    Supervising Provider:   DOMENICA BLACKBIRD A [[id number]]   montelukast  (SINGULAIR ) 10 MG tablet    Sig: Take 1 tablet (10 mg total) by mouth at bedtime.    Dispense:  90 tablet    Refill:  0    ZERO refills remain on this prescription. Your patient is requesting advance approval of refills for this medication to PREVENT ANY MISSED DOSES    Supervising Provider:   DOMENICA BLACKBIRD A [[id number]]   pantoprazole  (PROTONIX ) 40 MG tablet    Sig: Take 1 tablet (40 mg total) by mouth daily.    Dispense:  30 tablet    Refill:  3    Supervising Provider:   DOMENICA BLACKBIRD A [[id number]]   propranolol  (INDERAL ) 60 MG tablet    Sig: Take 1 tablet (60 mg total) by mouth 2 (two) times daily.    Dispense:  180 tablet    Refill:  0    Supervising Provider:   DOMENICA BLACKBIRD A [[id number]]   rosuvastatin  (CRESTOR ) 5 MG tablet    Sig: Take 1 tablet (5 mg total) by mouth 3 (three) times a week.    Dispense:  36 tablet    Refill:  0    Supervising Provider:   DOMENICA BLACKBIRD A  [[id number]]   venlafaxine  XR (EFFEXOR -XR) 75 MG 24 hr capsule    Sig: Take 1 capsule (75 mg total) by mouth daily with breakfast.    Dispense:  90 capsule    Refill:  0    Supervising Provider:   DOMENICA BLACKBIRD A [[id number]]   azelastine  (ASTELIN ) 0.1 % nasal spray    Sig: Place 1 spray into both nostrils 2 (two) times daily. Use in each nostril as directed  Dispense:  30 mL    Refill:  3    Supervising Provider:   DOMENICA BLACKBIRD A [[id number]]   metFORMIN  (GLUCOPHAGE -XR) 750 MG 24 hr tablet    Sig: Take 1 tablet (750 mg total) by mouth daily with breakfast.    Dispense:  90 tablet    Refill:  1    Supervising Provider:   DOMENICA BLACKBIRD A [[id number]]   amphetamine -dextroamphetamine  (ADDERALL  XR) 30 MG 24 hr capsule    Sig: Take 2 capsules (60 mg total) by mouth every morning.    Dispense:  60 capsule    Refill:  0    GENERIC ONLY PLEASE    Supervising Provider:   DOMENICA BLACKBIRD A [[id number]]    I discussed the assessment and treatment plan with the patient. The patient was provided an opportunity to ask questions and all were answered. The patient agreed with the plan and demonstrated an understanding of the instructions.   The patient was advised to call back or seek an in-person evaluation if the symptoms worsen or if the condition fails to improve as anticipated.  I provided 27 minutes of face-to-face time during this encounter.   Harlene Nicole Jolly, NP [facility] [phone] (phone) [phone] (fax)  [facility]

## 2024-02-14 ENCOUNTER — Encounter: Payer: Self-pay | Admitting: Family Medicine

## 2024-02-15 ENCOUNTER — Other Ambulatory Visit: Payer: Self-pay | Admitting: Family Medicine

## 2024-02-15 DIAGNOSIS — M5416 Radiculopathy, lumbar region: Secondary | ICD-10-CM

## 2024-02-15 DIAGNOSIS — F411 Generalized anxiety disorder: Secondary | ICD-10-CM

## 2024-02-15 DIAGNOSIS — M5441 Lumbago with sciatica, right side: Secondary | ICD-10-CM

## 2024-02-15 NOTE — Telephone Encounter (Unsigned)
 Copied from CRM [id number]. Topic: Clinical - Medication Refill >> Feb 15, 2024  5:20 PM Abigail D wrote: Medication:  HYDROcodone -acetaminophen  (NORCO) 10-325 MG  ALPRAZolam  (XANAX ) 1 MG tablet  Has the patient contacted their pharmacy? No (Agent: If no, request that the patient contact the pharmacy for the refill. If patient does not wish to contact the pharmacy document the reason why and proceed with request.) (Agent: If yes, when and what did the pharmacy advise?)  This is the patient's preferred pharmacy:  [id number], [place] - [address] AT [facility] & [address] [place] [address] Phone: [phone] Fax: [phone]   Is this the correct pharmacy for this prescription? Yes If no, delete pharmacy and type the correct one.   Has the prescription been filled recently? Yes  Is the patient out of the medication? Yes  Has the patient been seen for an appointment in the last year OR does the patient have an upcoming appointment? Yes  Can we respond through MyChart? Yes  Agent: Please be advised that Rx refills may take up to 3 business days. We ask that you follow-up with your pharmacy.

## 2024-02-16 MED ORDER — ALPRAZOLAM 1 MG PO TABS
1.0000 mg | ORAL_TABLET | Freq: Two times a day (BID) | ORAL | 0 refills | Status: AC | PRN
Start: 2024-02-16 — End: ?

## 2024-02-16 MED ORDER — HYDROCODONE-ACETAMINOPHEN 10-325 MG PO TABS: 1.0000 | ORAL_TABLET | Freq: Three times a day (TID) | ORAL | 0 refills | Status: AC | PRN

## 2024-02-16 NOTE — Telephone Encounter (Unsigned)
 Copied from CRM [id number]. Topic: General - Call Back - No Documentation >> Feb 15, 2024  5:17 PM Leah C wrote: Reason for CRM: Patient called in regards to message from Dr. Domenica for finding care at another clinic but still being able to cover her meds for a month. Patient would appreciate a phone call back from Dr. Domenica or Dr. Elisabeth nurse. Patient has additional questions in regards to message in MyChart. Patients contact is [phone].

## 2024-02-16 NOTE — Telephone Encounter (Signed)
 Called patient and no answer. Advised her that Dr. Domenica did send her a MyChart message.

## 2024-03-03 ENCOUNTER — Encounter: Payer: Self-pay | Admitting: Student

## 2024-03-08 ENCOUNTER — Telehealth: Payer: Self-pay

## 2024-03-08 NOTE — Telephone Encounter (Signed)
 Copied from CRM [id number]. Topic: Clinical - Prescription Issue >> Mar 07, 2024  5:29 PM Nicole Ball wrote: Reason for CRM: patient stated that the [facility] in [place] is refusing to give her medications such as  HYDROcodone -acetaminophen  (NORCO) 10-325 MG  ALPRAZolam  (XANAX ) 1 MG tablet  amphetamine -dextroamphetamine  (ADDERALL  XR) 30 MG 24 hr capsule zolpidem  (AMBIEN ) 10 MG tablet gabapentin  (NEURONTIN ) 600 MG tablet   because they are controlled substance and need all her medication sent to  [facility] [place], [place] - [address] [phone] [place] [id number]

## 2024-03-09 NOTE — Telephone Encounter (Signed)
 Send patient a mychart message making her aware that medications can not be filled.

## 2024-04-13 ENCOUNTER — Other Ambulatory Visit: Payer: Self-pay | Admitting: Family Medicine

## 2024-04-13 DIAGNOSIS — I4711 Inappropriate sinus tachycardia, so stated: Secondary | ICD-10-CM

## 2024-04-13 DIAGNOSIS — E782 Mixed hyperlipidemia: Secondary | ICD-10-CM

## 2024-04-13 DIAGNOSIS — I10 Essential (primary) hypertension: Secondary | ICD-10-CM

## 2024-05-23 ENCOUNTER — Other Ambulatory Visit: Payer: Self-pay | Admitting: Student
